# Patient Record
Sex: Female | Born: 1955 | Race: White | Hispanic: No | Marital: Single | Smoking: Never smoker
Health system: Southern US, Community
[De-identification: ages and names within clinical notes are randomized; demographics above are authoritative.]

## PROBLEM LIST (undated history)

## (undated) DIAGNOSIS — N889 Noninflammatory disorder of cervix uteri, unspecified: Secondary | ICD-10-CM

## (undated) DIAGNOSIS — E669 Obesity, unspecified: Secondary | ICD-10-CM

## (undated) DIAGNOSIS — Z96649 Presence of unspecified artificial hip joint: Secondary | ICD-10-CM

## (undated) DIAGNOSIS — R479 Unspecified speech disturbances: Secondary | ICD-10-CM

## (undated) DIAGNOSIS — R0600 Dyspnea, unspecified: Secondary | ICD-10-CM

## (undated) DIAGNOSIS — I471 Supraventricular tachycardia: Secondary | ICD-10-CM

## (undated) DIAGNOSIS — J9612 Chronic respiratory failure with hypercapnia: Secondary | ICD-10-CM

## (undated) DIAGNOSIS — R198 Other specified symptoms and signs involving the digestive system and abdomen: Secondary | ICD-10-CM

## (undated) DIAGNOSIS — E039 Hypothyroidism, unspecified: Secondary | ICD-10-CM

## (undated) DIAGNOSIS — S0230XA Fracture of orbital floor, unspecified side, initial encounter for closed fracture: Secondary | ICD-10-CM

## (undated) DIAGNOSIS — I5032 Chronic diastolic (congestive) heart failure: Secondary | ICD-10-CM

## (undated) DIAGNOSIS — I10 Essential (primary) hypertension: Secondary | ICD-10-CM

## (undated) DIAGNOSIS — Q210 Ventricular septal defect: Secondary | ICD-10-CM

## (undated) DIAGNOSIS — G4733 Obstructive sleep apnea (adult) (pediatric): Secondary | ICD-10-CM

## (undated) DIAGNOSIS — I351 Nonrheumatic aortic (valve) insufficiency: Secondary | ICD-10-CM

## (undated) DIAGNOSIS — T884XXA Failed or difficult intubation, initial encounter: Secondary | ICD-10-CM

## (undated) DIAGNOSIS — I119 Hypertensive heart disease without heart failure: Secondary | ICD-10-CM

## (undated) DIAGNOSIS — Q969 Turner's syndrome, unspecified: Secondary | ICD-10-CM

## (undated) DIAGNOSIS — R590 Localized enlarged lymph nodes: Secondary | ICD-10-CM

## (undated) DIAGNOSIS — R600 Localized edema: Secondary | ICD-10-CM

## (undated) DIAGNOSIS — I480 Paroxysmal atrial fibrillation: Secondary | ICD-10-CM

## (undated) DIAGNOSIS — L309 Dermatitis, unspecified: Secondary | ICD-10-CM

## (undated) DIAGNOSIS — I2510 Atherosclerotic heart disease of native coronary artery without angina pectoris: Secondary | ICD-10-CM

## (undated) DIAGNOSIS — R739 Hyperglycemia, unspecified: Secondary | ICD-10-CM

## (undated) DIAGNOSIS — R001 Bradycardia, unspecified: Secondary | ICD-10-CM

## (undated) DIAGNOSIS — M1990 Unspecified osteoarthritis, unspecified site: Secondary | ICD-10-CM

## (undated) DIAGNOSIS — Z96659 Presence of unspecified artificial knee joint: Secondary | ICD-10-CM

## (undated) HISTORY — PX: HERNIA REPAIR: SHX51

## (undated) HISTORY — DX: Presence of unspecified artificial hip joint: Z96.649

## (undated) HISTORY — DX: Unspecified osteoarthritis, unspecified site: M19.90

## (undated) HISTORY — DX: Essential (primary) hypertension: I10

## (undated) HISTORY — DX: Presence of unspecified artificial knee joint: Z96.659

## (undated) HISTORY — DX: Obesity, unspecified: E66.9

## (undated) HISTORY — DX: Obstructive sleep apnea (adult) (pediatric): G47.33

## (undated) HISTORY — DX: Supraventricular tachycardia: I47.1

## (undated) HISTORY — DX: Noninflammatory disorder of cervix uteri, unspecified: N88.9

## (undated) HISTORY — DX: Atherosclerotic heart disease of native coronary artery without angina pectoris: I25.10

## (undated) HISTORY — DX: Hypothyroidism, unspecified: E03.9

## (undated) HISTORY — DX: Dyspnea, unspecified: R06.00

## (undated) HISTORY — DX: Hypertensive heart disease without heart failure: I11.9

## (undated) HISTORY — DX: Nonrheumatic aortic (valve) insufficiency: I35.1

## (undated) HISTORY — DX: Other specified symptoms and signs involving the digestive system and abdomen: R19.8

## (undated) HISTORY — PX: JOINT REPLACEMENT: SHX530

## (undated) HISTORY — PX: TOTAL HIP ARTHROPLASTY: SHX124

## (undated) HISTORY — DX: Localized edema: R60.0

## (undated) HISTORY — DX: Ventricular septal defect: Q21.0

## (undated) HISTORY — DX: Turner's syndrome, unspecified: Q96.9

## (undated) HISTORY — DX: Paroxysmal atrial fibrillation: I48.0

## (undated) HISTORY — DX: Fracture of orbital floor, unspecified side, initial encounter for closed fracture: S02.30XA

## (undated) HISTORY — DX: Chronic respiratory failure with hypercapnia: J96.12

## (undated) HISTORY — DX: Chronic diastolic (congestive) heart failure: I50.32

## (undated) HISTORY — PX: COLPOSCOPY: SHX161

## (undated) HISTORY — DX: Localized enlarged lymph nodes: R59.0

## (undated) HISTORY — DX: Hyperglycemia, unspecified: R73.9

---

## 1991-01-06 DIAGNOSIS — N889 Noninflammatory disorder of cervix uteri, unspecified: Secondary | ICD-10-CM

## 1991-01-06 HISTORY — DX: Noninflammatory disorder of cervix uteri, unspecified: N88.9

## 1991-01-06 HISTORY — PX: DILATION AND CURETTAGE OF UTERUS: SHX78

## 1991-01-06 HISTORY — PX: CERVICAL CONE BIOPSY: SUR198

## 1997-05-29 ENCOUNTER — Other Ambulatory Visit: Admission: RE | Admit: 1997-05-29 | Discharge: 1997-05-29 | Payer: Self-pay | Admitting: Obstetrics and Gynecology

## 1998-06-13 ENCOUNTER — Other Ambulatory Visit: Admission: RE | Admit: 1998-06-13 | Discharge: 1998-06-13 | Payer: Self-pay | Admitting: Obstetrics and Gynecology

## 1999-09-23 ENCOUNTER — Other Ambulatory Visit: Admission: RE | Admit: 1999-09-23 | Discharge: 1999-09-23 | Payer: Self-pay | Admitting: Obstetrics and Gynecology

## 2000-09-23 ENCOUNTER — Other Ambulatory Visit: Admission: RE | Admit: 2000-09-23 | Discharge: 2000-09-23 | Payer: Self-pay | Admitting: Obstetrics and Gynecology

## 2001-09-09 ENCOUNTER — Other Ambulatory Visit: Admission: RE | Admit: 2001-09-09 | Discharge: 2001-09-09 | Payer: Self-pay | Admitting: Obstetrics and Gynecology

## 2002-09-25 ENCOUNTER — Other Ambulatory Visit: Admission: RE | Admit: 2002-09-25 | Discharge: 2002-09-25 | Payer: Self-pay | Admitting: Obstetrics and Gynecology

## 2003-11-08 ENCOUNTER — Other Ambulatory Visit: Admission: RE | Admit: 2003-11-08 | Discharge: 2003-11-08 | Payer: Self-pay | Admitting: Obstetrics and Gynecology

## 2004-11-13 ENCOUNTER — Other Ambulatory Visit: Admission: RE | Admit: 2004-11-13 | Discharge: 2004-11-13 | Payer: Self-pay | Admitting: Obstetrics and Gynecology

## 2005-12-16 ENCOUNTER — Other Ambulatory Visit: Admission: RE | Admit: 2005-12-16 | Discharge: 2005-12-16 | Payer: Self-pay | Admitting: Obstetrics and Gynecology

## 2007-01-13 ENCOUNTER — Other Ambulatory Visit: Admission: RE | Admit: 2007-01-13 | Discharge: 2007-01-13 | Payer: Self-pay | Admitting: Obstetrics and Gynecology

## 2007-11-17 ENCOUNTER — Encounter: Admission: RE | Admit: 2007-11-17 | Discharge: 2007-11-17 | Payer: Self-pay | Admitting: Cardiology

## 2008-02-02 ENCOUNTER — Ambulatory Visit (HOSPITAL_COMMUNITY): Admission: RE | Admit: 2008-02-02 | Discharge: 2008-02-02 | Payer: Self-pay | Admitting: Orthopedic Surgery

## 2008-03-13 ENCOUNTER — Inpatient Hospital Stay (HOSPITAL_COMMUNITY): Admission: RE | Admit: 2008-03-13 | Discharge: 2008-03-16 | Payer: Self-pay | Admitting: Orthopedic Surgery

## 2008-07-06 ENCOUNTER — Other Ambulatory Visit: Admission: RE | Admit: 2008-07-06 | Discharge: 2008-07-06 | Payer: Self-pay | Admitting: Obstetrics and Gynecology

## 2008-07-06 ENCOUNTER — Ambulatory Visit: Payer: Self-pay | Admitting: Obstetrics and Gynecology

## 2008-07-06 ENCOUNTER — Encounter: Payer: Self-pay | Admitting: Obstetrics and Gynecology

## 2008-08-07 ENCOUNTER — Ambulatory Visit: Payer: Self-pay | Admitting: Obstetrics and Gynecology

## 2009-06-20 HISTORY — PX: US ECHOCARDIOGRAPHY: HXRAD669

## 2009-10-08 ENCOUNTER — Other Ambulatory Visit: Admission: RE | Admit: 2009-10-08 | Discharge: 2009-10-08 | Payer: Self-pay | Admitting: Obstetrics and Gynecology

## 2009-10-08 ENCOUNTER — Ambulatory Visit: Payer: Self-pay | Admitting: Obstetrics and Gynecology

## 2009-10-17 ENCOUNTER — Ambulatory Visit: Payer: Self-pay | Admitting: Cardiology

## 2010-02-20 ENCOUNTER — Ambulatory Visit (INDEPENDENT_AMBULATORY_CARE_PROVIDER_SITE_OTHER): Payer: Medicaid Other | Admitting: Cardiology

## 2010-02-20 DIAGNOSIS — Z79899 Other long term (current) drug therapy: Secondary | ICD-10-CM

## 2010-02-20 DIAGNOSIS — I119 Hypertensive heart disease without heart failure: Secondary | ICD-10-CM

## 2010-02-20 DIAGNOSIS — E039 Hypothyroidism, unspecified: Secondary | ICD-10-CM

## 2010-04-17 LAB — CBC
HCT: 30.8 % — ABNORMAL LOW (ref 36.0–46.0)
Hemoglobin: 10.9 g/dL — ABNORMAL LOW (ref 12.0–15.0)
Hemoglobin: 14.4 g/dL (ref 12.0–15.0)
Hemoglobin: 9.4 g/dL — ABNORMAL LOW (ref 12.0–15.0)
MCHC: 34 g/dL (ref 30.0–36.0)
MCHC: 35.4 g/dL (ref 30.0–36.0)
MCV: 96 fL (ref 78.0–100.0)
RBC: 2.76 MIL/uL — ABNORMAL LOW (ref 3.87–5.11)
RBC: 3.21 MIL/uL — ABNORMAL LOW (ref 3.87–5.11)
WBC: 12.7 10*3/uL — ABNORMAL HIGH (ref 4.0–10.5)

## 2010-04-17 LAB — TYPE AND SCREEN: Antibody Screen: NEGATIVE

## 2010-04-17 LAB — BASIC METABOLIC PANEL
CO2: 27 mEq/L (ref 19–32)
Calcium: 8.5 mg/dL (ref 8.4–10.5)
Chloride: 105 mEq/L (ref 96–112)
Chloride: 105 mEq/L (ref 96–112)
Chloride: 106 mEq/L (ref 96–112)
GFR calc Af Amer: >60 mL/min (ref >60)
GFR calc Af Amer: >60 mL/min (ref >60)
GFR calc non Af Amer: >60 mL/min (ref >60)
GFR calc non Af Amer: >60 mL/min (ref >60)
Glucose, Bld: 133 mg/dL — ABNORMAL HIGH (ref 70–99)
Potassium: 3.5 mEq/L (ref 3.5–5.1)
Potassium: 4.1 mEq/L (ref 3.5–5.1)

## 2010-04-17 LAB — URINALYSIS, ROUTINE W REFLEX MICROSCOPIC
Nitrite: NEGATIVE
Specific Gravity, Urine: 1.02 (ref 1.005–1.030)
pH: 6 (ref 5.0–8.0)

## 2010-04-17 LAB — PROTIME-INR: INR: 1 (ref 0.00–1.49)

## 2010-04-17 LAB — DIFFERENTIAL
Basophils Relative: 0 % (ref 0–1)
Eosinophils Absolute: 0.1 10*3/uL (ref 0.0–0.7)
Lymphocytes Relative: 19 % (ref 12–46)
Monocytes Relative: 13 % — ABNORMAL HIGH (ref 3–12)
Neutrophils Relative %: 67 % (ref 43–77)

## 2010-04-17 LAB — URINE MICROSCOPIC-ADD ON

## 2010-04-17 LAB — APTT: aPTT: 25 seconds (ref 24–37)

## 2010-04-21 LAB — URINE MICROSCOPIC-ADD ON

## 2010-04-21 LAB — URINALYSIS, ROUTINE W REFLEX MICROSCOPIC
Bilirubin Urine: NEGATIVE
Protein, ur: 30 mg/dL — AB
Specific Gravity, Urine: 1.036 — ABNORMAL HIGH (ref 1.005–1.030)
pH: 6 (ref 5.0–8.0)

## 2010-04-21 LAB — BASIC METABOLIC PANEL
BUN: 15 mg/dL (ref 6–23)
CO2: 27 mEq/L (ref 19–32)
Creatinine, Ser: 0.7 mg/dL (ref 0.4–1.2)
GFR calc Af Amer: >60 mL/min (ref >60)
Glucose, Bld: 108 mg/dL — ABNORMAL HIGH (ref 70–99)
Potassium: 4.4 mEq/L (ref 3.5–5.1)
Sodium: 139 mEq/L (ref 135–145)

## 2010-04-21 LAB — DIFFERENTIAL
Basophils Absolute: 0 10*3/uL (ref 0.0–0.1)
Eosinophils Absolute: 0.1 10*3/uL (ref 0.0–0.7)
Lymphocytes Relative: 18 % (ref 12–46)
Lymphs Abs: 1.8 10*3/uL (ref 0.7–4.0)
Neutro Abs: 7.3 10*3/uL (ref 1.7–7.7)
Neutrophils Relative %: 70 % (ref 43–77)

## 2010-04-21 LAB — CBC
MCHC: 34.4 g/dL (ref 30.0–36.0)
MCV: 95.2 fL (ref 78.0–100.0)
RBC: 4.27 MIL/uL (ref 3.87–5.11)
WBC: 10.4 10*3/uL (ref 4.0–10.5)

## 2010-04-21 LAB — PROTIME-INR: INR: 1 (ref 0.00–1.49)

## 2010-04-21 LAB — APTT: aPTT: 23 seconds — ABNORMAL LOW (ref 24–37)

## 2010-04-21 LAB — TYPE AND SCREEN: ABO/RH(D): AB POS

## 2010-04-21 LAB — ABO/RH: ABO/RH(D): AB POS

## 2010-05-20 NOTE — H&P (Signed)
[name], [name]                ACCOUNT NO.:  [id number]   MEDICAL RECORD NO.:  [id number]          PATIENT TYPE:  INP   LOCATION:  NA                           FACILITY:  [facility]   PHYSICIAN:  Madlyn Frankel. Charlann Boxer, M.D.  DATE OF BIRTH:  [birth year]   DATE OF ADMISSION:  03/13/2008  DATE OF DISCHARGE:                              HISTORY & PHYSICAL   PROCEDURE:  A left total hip replacement.   CHIEF COMPLAINT:  Left hip pain.   HISTORY OF PRESENT ILLNESS:  A 55 year old female with a history of left  hip pain secondary to osteoarthritis and questionable developmental  dislocation of the hip.  Refractory to all conservative treatment.  Presurgically assessed by Dr. Patty Sermons.   PRIMARY CARE PHYSICIAN:  Dr. Patty Sermons.  Also sees Dr. Edyth Gunnels.   PAST MEDICAL HISTORY:  Significant for:   1. Osteoarthritis.  2. Hypertension.  3. Turner syndrome.  4. Heart murmur.  5. Cataracts.  6. Thyroid disease.   PAST SURGERIES:  1. Hernia repair.  2. D and C.   FAMILY HISTORY:  Noncontributory.   SOCIAL HISTORY:  A high school graduate, a nonsmoker.  Primary caregiver  is her mother in the hospital.   DRUG ALLERGIES:  No known drug allergies.   MEDICATIONS:  1. Klor-Con 10 mEq p.o. daily.  2. Estradiol 0.5 mg p.o. daily.  3. Medroxyprogesterone 2.5 mg daily.  4. Metoprolol 50 mg 1/2 tablet daily.  5. Levothyroxine unknown dosage amount.  6. Darvocet p.r.n.   REVIEW OF SYSTEMS:  HEENT:  She has some hearing loss.  RESPIRATORY:  Her last chest x-ray was in 2008 or 2009.  CARDIOVASCULAR:  Positive for  murmur.  EKG done December 2009.  MUSCULOSKELETAL:  Multiple joint pain  and back pain.  Otherwise, see HPI.   PHYSICAL EXAMINATION:  Pulse 72, respirations 16, blood pressure 142/80.  GENERAL:  Awake, alert, and oriented.  HEENT:  Normocephalic.  NECK:  Supple.  No carotid bruits.  CHEST/LUNGS:  Clear to auscultation bilaterally.  BREASTS:  Deferred.  HEART:  Regular rate  and rhythm.  S1, S2 distinct with heart murmur.  ABDOMEN:  Soft, nontender, nondistended.  Bowel sounds present.  PELVIS:  Stable.  GENITOURINARY:  Deferred.  EXTREMITIES:  Left hip has decreased range of motion with increased  pain.  SKIN:  No cellulitis.  NEUROLOGIC:  Intact distal sensibilities.   LABORATORY DATA:  Labs, EKG, chest x-ray all pending presurgical  testing.   IMPRESSION:  Left hip osteoarthritis with potential developmental  dislocation of the hip.   PLAN OF ACTION:  Left total hip replacement at [facility] on  March 13, 2008 by Dr. Charlann Boxer.  Risks and complications were discussed.  Postoperative medications were provided including aspirin for DVT  prophylaxis.     ______________________________  Yetta Glassman [address], [place]      Madlyn Frankel. Charlann Boxer, M.D.  Electronically Signed    BLM/MEDQ  D:  03/07/2008  T:  03/08/2008  Job:  [id number]   cc:   Cassell Clement, M.D.  Fax: [phone]   Rande Brunt. Eda Paschal, M.D.  Fax: [phone]

## 2010-05-20 NOTE — H&P (Signed)
[name], [name]                ACCOUNT NO.:  [id number]   MEDICAL RECORD NO.:  [id number]          PATIENT TYPE:  INP   LOCATION:  NA                           [facility]ACILITY[facility]   PHYSICIAN:  Donna Frankel. Charlann Dean, M.D.  DATE OF BIRTH:  1955-06-11   DATE OF ADMISSION:  02/07/2008  DATE OF DISCHARGE:                              HISTORY & PHYSICAL   PROCEDURE:  Left total hip replacement.   CHIEF COMPLAINT:  Left hip pain.   HISTORY OF PRESENT ILLNESS:  A 55 year old female with a history of left  hip pain secondary to osteoarthritis and questionable developmental  dislocation of the hip.  This has been refractory to all conservative  treatment.  There is extensive bone wear.  She utilizes a rolling walker  for ambulation.  She had been presurgical assessed by Dr. Patty Sermons  prior to surgery.   PRIMARY CARE PHYSICIAN:  Dr. Patty Sermons and she also sees Dr. Rande Brunt.  [place].   PAST MEDICAL HISTORY:  1. Significant for osteoarthritis.  2. Hypertension.  3. Turner syndrome.  4. Heart murmur.  5. Cataracts.  6. Thyroid disease.   PAST SURGERIES:  Hernia repair, D and C.   FAMILY HISTORY:  Noncontributory.   SOCIAL HISTORY:  Single high school graduate nonsmoker.  Primary  caregiver mother in the hospital.   DRUG ALLERGIES:  No known drug allergies.   MEDICATIONS:  1. Klor-Con 10 mEq p.o. daily.  2. Estradiol 0.5 mg daily.  3. Medroxyprogesterone 2.5 mg daily.  4. Metoprolol 50 mg 1/2 tablet per day.  5. Levothyroxine unknown dosage amount daily.  6. Darvocet-N 100 one to two p.o. daily p.r.n. pain.   REVIEW OF SYSTEMS:  HEENT/NEURO:  She has some hearing loss.  RESPIRATORY:  Last chest x-ray was in 2008 or 2009.  CARDIOVASCULAR:  Positive for murmur.  Last EKG was December 26, 2007.  MUSCULOSKELETAL:  Has multiple joint pains and back pain.  Otherwise see HPI.   PHYSICAL EXAMINATION:  VITAL SIGNS:  Pulse 72, respirations 16, blood  pressure 134/84.  GENERAL:  Awake,  alert and oriented, well developed, well nourished, no  acute distress utilize rolling walker.  HEENT: Normocephalic.  NECK:  Supple.  No carotid bruits.  CHEST/LUNGS:  Clear to auscultation bilaterally.  BREASTS:  Deferred.  HEART:  Regular rate and rhythm.  S1, S2 distinct with heart murmur.  ABDOMEN:  Soft, nontender, nondistended.  Bowel sounds present.  PELVIS:  Stable.  GENITOURINARY:  Deferred.  EXTREMITIES:  Left hip has decreased range of motion with increased  pain.  SKIN:  No cellulitis.  NEUROLOGIC:  Intact distal sensibilities.   LABORATORY DATA:  Labs, EKG, chest x-ray all pending presurgical  testing.   IMPRESSION:  Left hip osteoarthritis with potential developmental  dislocation of the hip.   PLAN OF ACTION:  Left total hip replacement at [facility] on  February 07, 2008 by Dr. Durene Romans.  Risks and complications were  discussed.  Postoperative medications were provided including aspirin  for DVT prophylaxis.     ______________________________  [name], [place]  [name], M.D.  Electronically Signed    BLM/MEDQ  D:  02/01/2008  T:  02/01/2008  Job:  [id number]   cc:   Cassell Clement, M.D.  Fax: [phone]   Rande Brunt. Eda Paschal, M.D.  Fax: [phone]

## 2010-05-20 NOTE — Op Note (Signed)
[name], [name]                ACCOUNT NO.:  [id number]   MEDICAL RECORD NO.:  [id number]          PATIENT TYPE:  INP   LOCATION:  1620                         FACILITY:  [facility]   PHYSICIAN:  Madlyn Frankel. Charlann Boxer, M.D.  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   PROCEDURE:  Left total hip replacement.   COMPONENTS USED:  DePuy hip system with a size 16 Pinnacle cup, 36 metal  liner and S-ROM 14 x 9 with a 14B small sleeve and a 36 plus zero ball.  It was a 14 x 9 36 standard stem.  I used a 36 plus zero ceramic ball.   SURGEON:  Madlyn Frankel. Charlann Boxer, M.D.   ASSISTANT:  Yetta Glassman. Mann, PA   ANESTHESIA:  Spinal.   ESTIMATED BLOOD LOSS:  Approximately 500 mL.   DRAINS:  One Hemovac.   SPECIMENS:  None.   COMPLICATIONS:  None.   INDICATIONS FOR PROCEDURE:  [name] is a 55 year old female with a history  of Turner syndrome and progressive left hip pain with known degenerative  joint disease.  She had put off hip surgery with progressive shortening  of left lower extremity due to erosive changes and dysplasia as well as  erosive flattening of the femoral head.  Risks and benefits had been  discussed in the perioperative period.  She had obtained medical  clearance.  We reviewed the risks of infection, DVT, component failure,  need for revision surgery as well as bearing discussion related to her  age, etc.  Consent was obtained for the benefit of pain relief.  Please  note that my preoperative plan consisted of using a standard proximal  fitting prosthesis noting that she had a very tight femoral canal, it  was a type A with backup prosthesis as variable in case and subsequently  this was required.   PROCEDURE IN  DETAIL:  The patient was brought to the operating theater.  Once adequate anesthesia and preoperative antibiotics, Ancef,  administered, the patient was positioned into the right lateral  decubitus position with left side up, left lower extremity was then  prepped and draped in a sterile fashion.  A time-out was performed  identifying the patient, planned procedure and extremity.  A lateral  based incision was made for a posterior approach to the hip.  The  iliotibial band and gluteal fascia were then split for a posterior  approach.  The short external rotators were taken down separate from the  posterior capsule and L capsulotomy was performed indicating a  hemarthrosis perhaps due to fragmentation from the femoral head.  The  hip was dislocated and severe degenerative joint disease and flattening  was noted and appreciated.  A neck osteotomy was initially made for a  Trilock type component with an angled cut into the troch fossa.   Attention was first directed  to this Trilock.  I prepared it in the  standard fashion with starting drill, hand reamed once and recognized  the tightness of the canal with the hand reamer.  I opened up some other  reamers to try to open up a little bit distal so I could at least get  some fit down distally to prevent splitting in the femur with the wells  type component.  However, when I noted that I was unable to get the zero  stem partially down was unable to get it down even with some reaming I  decided this was not going to be her best choice and decided to use the  S-ROM component.  We did have available in the hospital the 14 x 9  system which was a small component and available for patients in this  condition.   While we were opening up the S-ROM components on the back table I went  ahead and prepared the acetabulum and acetabular exposure was obtained  and retractor placed and labrum was debrided.  I began reaming with a 43  reamer and then went up  to a 44 reamer and then reamed up by 2 to a 39  reamer.  At this point I had good bone bed preparation.  I impacted a 50  mm cup.  It appeared to be positioned anatomically beneath the anterior  and the position of the cup exposed superolateral as she did have a  little bit of loss of bone here from erosive degenerative changes.   I measured and I checked with the cup guide and was happy with the  abduction and in addition the forward flexion.  I placed a single  cancellous screw to help with initial fixation.  Based on this I went  ahead and placed a central hole eliminator and the final 36 neutral  liner again with the cup positioned about 20 degrees of forward flexion  and 40 degrees of adduction.   At this point I began to prepare the femur for S-ROM.  I went ahead and  straight reamed with an 8 and then a 9 mm reamer and then a 9.5 three-  quarters of the way down.   Initially I reamed proximally with the 14B and then a 14D.  I then  milled the proximal femur to a large.  After the initial trial with the  large I had a difficult time getting the hip reduced indicating that  there was too much length to get this reduced.  I then removed the trial  replacement and went to a B small sleeve removing some of the bone on  the proximal femur.  Even then I was unable to get it reduced.  From  this point I made a decision to over ream in an attempt to try to get  the component a little bit further.  When I did this there was limited I  found by the proximal femur with this D reamer.  I thus chose to use a B  component.  The D sleeve was removed and I used the B small sleeve  removing remaining bone.  I removed it caudally 6-8 mm of bone off the  proximal femur as relationship to my initial cut and in relationship  with typical trochanter.   I then performed a trial reduction.  This was carried out with a B small  sleeve centered at about 15-20 degrees of native anteversion and then  with  the stem  in neutral position.  The hip was noted to have the  combined anteversion of 45 degrees of anteversion.  It was stable  throughout a range of motion without evidence of impingement.   It now appeared to be lengthening overly in the left lower extremity  compared to the right.  At this point I had to remove all the trial  components and the final 14B small sleeve was opened.  The 14 x 9 S-ROM  36 standard stem was opened.  The 14B small sleeve was then impacted to  the level that the trial had sat followed by placement of the stem.  It  added just a little bit of anteversion to this in relation to neutral on  purpose.  Placed in a trial reduction a 36 plus zero ball was chosen and  the ceramic ball opened and impacted onto a dry trunnion.  The hip was  reduced.  We had irrigated the hip throughout the case.  There was no  significant bleeding requiring hemostasis.  Medium Hemovac drain was  placed deep.  I reapproximated from the posterior capsule to the  superior leaflet.  The iliotibial band and gluteal fascia were then  reapproximated using #1 Vicryl over the trochanter and over the Hemovac.  The  remainder of the wound was closed with 2-0 Vicryl and a running 4-0  Monocryl.  The hip was cleaned, dried and dressed sterilely with Steri-  Strips and a Mepilex dressing.  She was brought to the recovery room in  stable condition, tolerating the procedure well.      [name], M.D.  Electronically Signed     MDO/MEDQ  D:  03/13/2008  T:  03/14/2008  Job:  [id number]

## 2010-05-23 NOTE — Discharge Summary (Signed)
[name], [name]                ACCOUNT NO.:  [id number]   MEDICAL RECORD NO.:  [id number]          PATIENT TYPE:  INP   LOCATION:  1620                         FACILITY:  [facility]   PHYSICIAN:  Madlyn Frankel. Charlann Boxer, M.D.  DATE OF BIRTH:  13-Aug-1955   DATE OF ADMISSION:  03/13/2008  DATE OF DISCHARGE:  03/16/2008                               DISCHARGE SUMMARY   ADMITTING DIAGNOSES:  1. Osteoarthritis.  2. Hypertension.  3. Turner syndrome.  4. Heart murmur.  5. Cataract.  6. Thyroid disease.   DISCHARGE DIAGNOSES:  1. Osteoarthritis.  2. Hypertension.  3. Turner syndrome.  4. Heart murmur.  5. Cataract.  6. Thyroid disease.   HISTORY OF PRESENT ILLNESS:  A 55 year old female with a history of left  hip pain secondary to osteoarthritis with questionable developmental  dislocation of the hip.  It was refractory to all conservative  treatments.   CONSULTS:  None.   PROCEDURE:  A left total hip replacement by surgeon, Dr. Durene Romans.  Assistant, Dwyane Luo, P.A.-C.   LABORATORY DATA:  CBC, final reading, white blood cells 12.1, hemoglobin  9.4, hematocrit 26.9, platelets 172.  White cell differential, no  significant abnormalities.  Coagulation normal.  Metabolic panel, sodium  139, potassium 3.5, BUN was 12, creatinine 0.47, glucose 123.  Calcium  8.3.  UA showed rare bacteria, many epithelials, moderate leuk esterase,  treated preoperatively.   HOSPITAL COURSE:  Patient underwent a left total hip replacement,  admitted to the orthopedic floor.  Her stay was unremarkable.  She  remained hemodynamically and orthopedically stable throughout her course  of stay.  She was afebrile.  Dressing was changed on a daily basis.  Wound showed no signs of infection.  No significant drainage.  She was  neurovascularly intact on the left lower extremity throughout her course  of stay with improving hip flexor strength.  She was weightbearing as  tolerated, making good progress while in  the hospital.  By day 3, she  had met all discharge criteria for discharge home with home health care  PT.   DISCHARGE DISPOSITION:  Discharged home with home health care PT in  stable and improved condition.   DISCHARGE DIET:  Heart healthy.   DISCHARGE WOUND CARE:  Keep dry.   DISCHARGE MEDICATIONS:  1. Aspirin 325 mg one p.o. b.i.d. x6 weeks.  2. Robaxin 500 mg one p.o. q.6 to 8 p.r.n. muscle spasm pain.  3. Iron 325 mg one p.o. t.i.d. x3 weeks.  4. Colace 100 mg one p.o. b.i.d. p.r.n. constipation.  5. MiraLax 17 g p.o. daily p.r.n. constipation.  6. Norco 5/325 one to two p.o. q.4 to 6 p.r.n. pain.  7. Klor-Con 10 mEq 2 tablets q.a.m.  8. Metoprolol 50 mg 1/2 tab q.a.m.  9. Estradiol 0.5 mg one p.o. q.a.m.  10.Medroxyprogesterone 2.5 mg p.o. q.a.m.  11.Levothyroxine 100 mcg p.o. q.a.m.  12.Darvocet, hold.  13.Hydrochlorothiazide 25 mg 1/2 tab q.a.c.   DISCHARGE PHYSICAL THERAPY:  Weightbearing as tolerated with use of  rolling walker.   DISCHARGE FOLLOWUP:  Follow up with Dr. Charlann Boxer at  phone number [phone] in  2 weeks for wound check.     ______________________________  Yetta Glassman. [address], [place]      Madlyn Frankel. Charlann Boxer, M.D.  Electronically Signed    BLM/MEDQ  D:  03/30/2008  T:  03/30/2008  Job:  [id number]   cc:   Cassell Clement, M.D.  Fax: [phone]   Rande Brunt. Eda Paschal, M.D.  Fax: [phone]

## 2010-06-17 ENCOUNTER — Other Ambulatory Visit: Payer: Self-pay | Admitting: Cardiology

## 2010-06-18 NOTE — Telephone Encounter (Signed)
Med refill

## 2010-10-02 ENCOUNTER — Encounter: Payer: Self-pay | Admitting: Cardiology

## 2010-10-16 ENCOUNTER — Ambulatory Visit (INDEPENDENT_AMBULATORY_CARE_PROVIDER_SITE_OTHER): Payer: Medicaid Other | Admitting: Cardiology

## 2010-10-16 ENCOUNTER — Encounter: Payer: Self-pay | Admitting: Cardiology

## 2010-10-16 VITALS — BP 131/70 | HR 63 | Ht <= 58 in | Wt 148.0 lb

## 2010-10-16 DIAGNOSIS — E039 Hypothyroidism, unspecified: Secondary | ICD-10-CM

## 2010-10-16 DIAGNOSIS — I359 Nonrheumatic aortic valve disorder, unspecified: Secondary | ICD-10-CM

## 2010-10-16 DIAGNOSIS — I119 Hypertensive heart disease without heart failure: Secondary | ICD-10-CM

## 2010-10-16 DIAGNOSIS — I351 Nonrheumatic aortic (valve) insufficiency: Secondary | ICD-10-CM | POA: Insufficient documentation

## 2010-10-16 DIAGNOSIS — M1990 Unspecified osteoarthritis, unspecified site: Secondary | ICD-10-CM

## 2010-10-16 DIAGNOSIS — Q969 Turner's syndrome, unspecified: Secondary | ICD-10-CM

## 2010-10-16 HISTORY — DX: Unspecified osteoarthritis, unspecified site: M19.90

## 2010-10-16 HISTORY — DX: Hypertensive heart disease without heart failure: I11.9

## 2010-10-16 NOTE — Patient Instructions (Signed)
The current medical regimen is effective;  continue present plan and medications.  Work harder on diet and weight loss  Your physician wants you to follow-up in: 6 months You will receive a reminder letter in the mail two months in advance. If you don't receive a letter, please call our office to schedule the follow-up appointment.

## 2010-10-16 NOTE — Progress Notes (Signed)
[name] Date of Birth:  Feb 03, 1955 [facility] / [facility] [address]   [address] [place], [place]  [address] [phone]           Fax   [phone]  HPI: This pleasant 55 year old single, Caucasian female is seen for a six-month followup office visit.  She has a history of Turner's syndrome.  She also has a history of mild essential hypertension.  She has a history of mild aortic insufficiency murmur.  She has a history of osteoarthritis and had to have a left total hip replacement in March of 2010.  She also has problems with her right knee arthritis and sees Dr. Marcy Salvo who has given her intermittent steroid injections, which have helped.  The patient has a history of hypothyroidism  Current Outpatient Prescriptions  Medication Sig Dispense Refill  . ESTRADIOL TD Place onto the skin once a week.        . hydrochlorothiazide 25 MG tablet TAKE ONE TABLET BY MOUTH ONCE DAILY  34 tablet  11  . levothyroxine (SYNTHROID, LEVOTHROID) 75 MCG tablet Take 75 mcg by mouth daily.        . metoprolol tartrate (LOPRESSOR) 25 MG tablet Take 25 mg by mouth daily.        . potassium chloride (K-DUR) 10 MEQ tablet Take 10 mEq by mouth 2 (two) times daily.          No Known Allergies  Patient Active Problem List  Diagnoses  . Benign hypertensive heart disease without heart failure  . Turner syndrome  . Hypothyroid  . Aortic insufficiency  . Osteoarthritis    History  Smoking status  . Never Smoker   Smokeless tobacco  . Not on file    History  Alcohol Use No    No family history on file.  Review of Systems: The patient denies any heat or cold intolerance.  No weight gain or weight loss.  The patient denies headaches or blurry vision.  There is no cough or sputum production.  The patient denies dizziness.  There is no hematuria or hematochezia.  The patient denies any muscle aches or arthritis.  The patient denies any rash.  The patient denies frequent falling  or instability.  There is no history of depression or anxiety.  All other systems were reviewed and are negative.   Physical Exam: Filed Vitals:   10/16/10 1515  BP: 131/70  Pulse: 63   Gen. appearance reveals a well-developed, well-nourished, middle-aged woman in no distress.Pupils equal and reactive.   Extraocular Movements are full.  There is no scleral icterus.  The mouth and pharynx are normal.  The neck is supple.  The carotids reveal no bruits.  The jugular venous pressure is normal.  The thyroid is not enlarged.  There is no lymphadenopathy.  She does have a webbed neck, consistent with Turner's syndrome.The chest is clear to percussion and auscultation. There are no rales or rhonchi. Expansion of the chest is symmetrical.  Heart reveals a grade 2/6 decrescendo murmur of aortic insufficiency.The abdomen is soft and nontender. Bowel sounds are normal. The liver and spleen are not enlarged. There Are no abdominal masses. There are no bruits.  The pedal pulses are good.    There is no cyanosis or clubbing.  He does have chronic lymphedema of both feet, worse on the rightStrength is normal and symmetrical in all extremities.  There is no lateralizing weakness.  There are no sensory deficits.  The skin  is warm and dry.  There is no rash.   EKG shows sinus rhythm with marked sinus arrhythmia, prolonged QT, and minor nonspecific T-wave flattening.    Assessment / Plan:  Continue same medication.  Recheck in 6 months for followup office visit and after that consider repeat echocardiogram

## 2010-10-16 NOTE — Assessment & Plan Note (Signed)
The patient has a past history of a soft murmur of aortic insufficiency.  Her last echocardiogram was 06/20/09 and should a normal aortic root with moderate aortic insufficiency and trace mitral regurgitation.  The patient has not been experiencing any symptoms of congestive heart failure.  Her electrocardiogram today shows no evidence of LVH or strain

## 2010-10-16 NOTE — Assessment & Plan Note (Signed)
The patient has not been experiencing any chest pain or shortness of breath.  Has not been exercising as much because her mother has been ill and her weight has gone up 3 pounds.  Also, her right hip and left knee problems cause her to have difficulty ambulating.

## 2010-10-29 ENCOUNTER — Other Ambulatory Visit: Payer: Self-pay | Admitting: Obstetrics and Gynecology

## 2010-10-30 NOTE — Telephone Encounter (Signed)
I received refill request from pt pharmacy for generic Provera. Pt's last CE was Oct 2011.  I called and left message reminding her of this and that she needs to call and schedule.  I will refill Provera this time.

## 2010-11-14 ENCOUNTER — Telehealth: Payer: Self-pay | Admitting: Cardiology

## 2010-11-14 DIAGNOSIS — J069 Acute upper respiratory infection, unspecified: Secondary | ICD-10-CM

## 2010-11-14 MED ORDER — AZITHROMYCIN 250 MG PO TABS: ORAL_TABLET | ORAL | Status: AC

## 2010-11-14 NOTE — Telephone Encounter (Signed)
Mucinex and Z-Pak

## 2010-11-14 NOTE — Telephone Encounter (Signed)
Pt calling stating that she may possibly need to get something for a cold. Pt has a lot of coughing and sneezing that started yesterday. Please return pt call to discuss further.

## 2010-11-14 NOTE — Telephone Encounter (Signed)
Nonproductive cough, denies fever.  Started yesterday doesn't feel bad just coughs so much especially at night.  claritin didn't help.  [facility].  Please advise

## 2010-11-14 NOTE — Telephone Encounter (Signed)
Advised patient, call back if not better or worse

## 2010-11-18 ENCOUNTER — Telehealth: Payer: Self-pay | Admitting: Cardiology

## 2010-11-18 DIAGNOSIS — R05 Cough: Secondary | ICD-10-CM

## 2010-11-18 NOTE — Telephone Encounter (Signed)
States she is coughing so much that sometimes she does vomit up mucus.  Finished Zpak today, denies any fever.  Would like cough medication called in.  Advised  Dr. Patty Sermons working at hospital today and would be tomorrow before addressed.  Is still using Mucinex.Please advise

## 2010-11-18 NOTE — Telephone Encounter (Signed)
Advised mother

## 2010-11-18 NOTE — Telephone Encounter (Signed)
Pt has a cold and is coughing and wants cough meds

## 2010-11-18 NOTE — Telephone Encounter (Signed)
Try tussionex 5 cc BID prn

## 2010-11-28 ENCOUNTER — Other Ambulatory Visit: Payer: Self-pay | Admitting: Obstetrics and Gynecology

## 2010-12-01 NOTE — Telephone Encounter (Signed)
E-SCRIPTS NOT WORKING TODAY. CALLED IN RX AND NOTIFIED PT. NEEDS TO SET UP HER OVERDUE EXAM.

## 2010-12-03 MED ORDER — HYDROCOD POLST-CHLORPHEN POLST 10-8 MG/5ML PO LQCR: 5.0000 mL | Freq: Two times a day (BID) | ORAL | Status: DC

## 2010-12-07 ENCOUNTER — Other Ambulatory Visit: Payer: Self-pay | Admitting: Cardiology

## 2010-12-08 ENCOUNTER — Telehealth: Payer: Self-pay | Admitting: Cardiology

## 2010-12-08 DIAGNOSIS — J069 Acute upper respiratory infection, unspecified: Secondary | ICD-10-CM

## 2010-12-08 MED ORDER — AMOXICILLIN 500 MG PO CAPS: 500.0000 mg | ORAL_CAPSULE | Freq: Three times a day (TID) | ORAL | Status: AC

## 2010-12-08 NOTE — Telephone Encounter (Signed)
Still coughing mostly nonproductive, sometimes clear mucus.  Had Zpak on 11/9.  Finished Tussionex twice daily and still coughing.  Denies fever or any other symptoms except "tickle" in throat.  Is there anything else she can try?

## 2010-12-08 NOTE — Telephone Encounter (Signed)
New message:  Pt has a bad lingering cough and wants to know if there is anything she can do for this? Pt also has a lot of drainage.  Please call her and advise/

## 2010-12-08 NOTE — Telephone Encounter (Signed)
Advised mother, call back if no better

## 2010-12-08 NOTE — Telephone Encounter (Signed)
Try amoxicillin 500 mg 3 times a day #20

## 2010-12-28 ENCOUNTER — Other Ambulatory Visit: Payer: Self-pay | Admitting: Obstetrics and Gynecology

## 2011-01-07 ENCOUNTER — Encounter: Payer: Medicaid Other | Admitting: Obstetrics and Gynecology

## 2011-01-14 DIAGNOSIS — N889 Noninflammatory disorder of cervix uteri, unspecified: Secondary | ICD-10-CM | POA: Insufficient documentation

## 2011-01-14 DIAGNOSIS — I10 Essential (primary) hypertension: Secondary | ICD-10-CM | POA: Insufficient documentation

## 2011-01-21 ENCOUNTER — Ambulatory Visit (INDEPENDENT_AMBULATORY_CARE_PROVIDER_SITE_OTHER): Payer: Medicaid Other | Admitting: Obstetrics and Gynecology

## 2011-01-21 ENCOUNTER — Other Ambulatory Visit (HOSPITAL_COMMUNITY)
Admission: RE | Admit: 2011-01-21 | Discharge: 2011-01-21 | Disposition: A | Discharge status: Home / Self Care | Payer: Medicaid Other | Source: Ambulatory Visit | Attending: Obstetrics and Gynecology | Admitting: Obstetrics and Gynecology

## 2011-01-21 ENCOUNTER — Encounter: Payer: Self-pay | Admitting: Obstetrics and Gynecology

## 2011-01-21 VITALS — BP 122/76 | Ht <= 58 in | Wt 150.0 lb

## 2011-01-21 DIAGNOSIS — Q969 Turner's syndrome, unspecified: Secondary | ICD-10-CM

## 2011-01-21 DIAGNOSIS — N952 Postmenopausal atrophic vaginitis: Secondary | ICD-10-CM

## 2011-01-21 DIAGNOSIS — Z780 Asymptomatic menopausal state: Secondary | ICD-10-CM

## 2011-01-21 DIAGNOSIS — Z124 Encounter for screening for malignant neoplasm of cervix: Secondary | ICD-10-CM

## 2011-01-21 DIAGNOSIS — Z01419 Encounter for gynecological examination (general) (routine) without abnormal findings: Secondary | ICD-10-CM | POA: Insufficient documentation

## 2011-01-21 DIAGNOSIS — N951 Menopausal and female climacteric states: Secondary | ICD-10-CM

## 2011-01-21 DIAGNOSIS — R635 Abnormal weight gain: Secondary | ICD-10-CM

## 2011-01-21 NOTE — Progress Notes (Signed)
Patient came to see me today for further followup. 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 She is having no bleeding. She does have atrophic vaginitis but is not sexually active. She is not having hot flashes on medication. She had a normal bone density many years ago. She is due for her mammogram. She is having no pelvic pain. She does not have dysuria, frequency, urgency, or incontinence. She has had no fractures. She may require knee replacement. Dr. Patty Sermons lowered her Synthroid and since he's done not she's had significant weight gain.  ROS: See above. In addition patient has hypertension, aortic insufficiency,and osteoarthritis.  Physical examination: HEENT within normal limits. Neck: Thyroid not large. No masses. Supraclavicular nodes: not enlarged. Breasts: Examined in both sitting midline position. No skin changes and no masses. Abdomen: Soft no guarding rebound or masses or hernia. Pelvic: External: Within normal limits. BUS: Within normal limits. Vaginal:within normal limits. Poor estrogen effect. No evidence of cystocele rectocele or enterocele. Cervix: clean. Uterus: Normal size and shape. Adnexa: No masses. Rectovaginal exam: Confirmatory and negative. Extremities: Within normal limits.  Assessment: #1. Turner syndrome #2. Atrophic vaginitis #3. Weight gain  Plan: We have elected to stop her HRT and see if she is asymptomatic. If she symptomatic we will reinitiate HRT. She will get a mammogram and a bone density. Suggest that she discuss with Dr. Patty Sermons change in her thyroid dose. Increase exercise. Watch diet.

## 2011-01-28 ENCOUNTER — Encounter: Payer: Self-pay | Admitting: *Deleted

## 2011-01-28 NOTE — Progress Notes (Signed)
Patient ID: [name], female   DOB: [birth year], 56 y.o.   MRN: [id number] Pt called wanting to know recent pap results. Lm on vm pap was normal.

## 2011-03-06 ENCOUNTER — Other Ambulatory Visit: Payer: Self-pay | Admitting: Cardiology

## 2011-03-06 NOTE — Telephone Encounter (Signed)
Refilled metoprolol 

## 2011-03-20 ENCOUNTER — Other Ambulatory Visit: Payer: Self-pay | Admitting: Cardiology

## 2011-03-20 NOTE — Telephone Encounter (Signed)
Refilled synthroid

## 2011-03-26 ENCOUNTER — Telehealth: Payer: Self-pay | Admitting: Cardiology

## 2011-03-26 NOTE — Telephone Encounter (Signed)
error 

## 2011-04-21 ENCOUNTER — Ambulatory Visit: Payer: Medicaid Other | Admitting: Cardiology

## 2011-06-02 ENCOUNTER — Other Ambulatory Visit: Payer: Self-pay | Admitting: Cardiology

## 2011-06-04 DIAGNOSIS — Q969 Turner's syndrome, unspecified: Secondary | ICD-10-CM

## 2011-06-04 DIAGNOSIS — E039 Hypothyroidism, unspecified: Secondary | ICD-10-CM

## 2011-06-04 DIAGNOSIS — Z96649 Presence of unspecified artificial hip joint: Secondary | ICD-10-CM | POA: Insufficient documentation

## 2011-06-04 DIAGNOSIS — E669 Obesity, unspecified: Secondary | ICD-10-CM

## 2011-06-04 HISTORY — DX: Hypothyroidism, unspecified: E03.9

## 2011-06-04 HISTORY — DX: Presence of unspecified artificial hip joint: Z96.649

## 2011-06-04 HISTORY — DX: Turner's syndrome, unspecified: Q96.9

## 2011-06-04 HISTORY — DX: Obesity, unspecified: E66.9

## 2011-06-08 ENCOUNTER — Telehealth: Payer: Self-pay | Admitting: Cardiology

## 2011-06-08 NOTE — Telephone Encounter (Signed)
Please return call to patient mother Randa Evens at [phone]  Patient mother has questions about the Vitamin D 50,000 unit medication patient's new PCP has prescribed.  Please return call at [phone].

## 2011-06-09 NOTE — Telephone Encounter (Signed)
Pt's mother calling back re message she left yesterday, has questions re the vitamin d pt was prescribed by pcp

## 2011-06-09 NOTE — Telephone Encounter (Signed)
I have no objection to her taking Vit D

## 2011-06-09 NOTE — Telephone Encounter (Signed)
Pt's mother wants okay from Dr. Patty Sermons for pt to take Vitamin D 50,000units per PCP.

## 2011-06-09 NOTE — Telephone Encounter (Signed)
Called numerous times/ line busy.

## 2011-06-10 NOTE — Telephone Encounter (Signed)
Left message ok to take 

## 2011-06-12 ENCOUNTER — Encounter: Payer: Self-pay | Admitting: Cardiology

## 2011-06-12 ENCOUNTER — Ambulatory Visit (INDEPENDENT_AMBULATORY_CARE_PROVIDER_SITE_OTHER): Payer: Medicaid Other | Admitting: Cardiology

## 2011-06-12 VITALS — BP 130/80 | HR 80 | Ht <= 58 in | Wt 152.0 lb

## 2011-06-12 DIAGNOSIS — I359 Nonrheumatic aortic valve disorder, unspecified: Secondary | ICD-10-CM

## 2011-06-12 DIAGNOSIS — E039 Hypothyroidism, unspecified: Secondary | ICD-10-CM

## 2011-06-12 DIAGNOSIS — M1990 Unspecified osteoarthritis, unspecified site: Secondary | ICD-10-CM

## 2011-06-12 DIAGNOSIS — I351 Nonrheumatic aortic (valve) insufficiency: Secondary | ICD-10-CM

## 2011-06-12 DIAGNOSIS — I119 Hypertensive heart disease without heart failure: Secondary | ICD-10-CM

## 2011-06-12 NOTE — Assessment & Plan Note (Signed)
The patient is clinically euthyroid.  She has been on Synthroid.  Patient is scheduled for complete physical later this summer with Dr. Cameron Sprang and at that time her thyroid function studies should be checked to be sure that she is on the proper dose of Synthroid

## 2011-06-12 NOTE — Assessment & Plan Note (Signed)
The patient is having more problems with her osteoarthritis of the right knee.  She has seen Dr. Charlann Boxer has suggested that she will probably need a right total knee replacement soon.  She previously had a left total hip replacement and did well.

## 2011-06-12 NOTE — Assessment & Plan Note (Signed)
Blood pressure was remaining stable on current therapy.  He is not having symptoms of congestive heart failure or palpitations or chest pain.  She does have chronic lymphedema of the right lower extremity.

## 2011-06-12 NOTE — Patient Instructions (Signed)
Your physician recommends that you continue on your current medications as directed. Please refer to the Current Medication list given to you today.  Your physician wants you to follow-up in: 6 months. You will receive a reminder letter in the mail two months in advance. If you don't receive a letter, please call our office to schedule the follow-up appointment.  

## 2011-06-12 NOTE — Assessment & Plan Note (Addendum)
The patient is not having any symptoms of congestive heart failure from her aortic insufficiency murmur.  Her last echocardiogram was 06/20/09 showing moderate aortic insufficiency and trace mitral regurgitation

## 2011-06-12 NOTE — Progress Notes (Signed)
[name] Date of Birth:  Apr 15, 1955 [facility] [address] [place], [place]  [address] [phone]  Fax   [phone]  HPI: This pleasant 56 year old woman is seen for a six-month followup office visit.  She has a past history of Turner's syndrome.  Her gynecologist recently stopped her estradiol treatment.  She has a history of essential hypertension and a history of mild aortic insufficiency.  She has also had problems with osteoarthritis. Current Outpatient Prescriptions  Medication Sig Dispense Refill  . chlorpheniramine-HYDROcodone (TUSSIONEX PENNKINETIC ER) 10-8 MG/5ML LQCR Take 5 mLs by mouth every 12 (twelve) hours.  120 mL  0  . Cholecalciferol (VITAMIN D PO) Take by mouth. 50,000 weekly      . hydrochlorothiazide 25 MG tablet TAKE ONE TABLET BY MOUTH ONCE DAILY  34 tablet  11  . KLOR-CON M10 10 MEQ tablet TAKE ONE TABLET BY MOUTH TWICE DAILY  60 each  4  . levothyroxine (SYNTHROID, LEVOTHROID) 75 MCG tablet TAKE ONE TABLET BY MOUTH ONCE A DAY.  30 tablet  11  . metoprolol (LOPRESSOR) 50 MG tablet TAKE ONE-HALF TABLET BY MOUTH DAILY.  15 tablet  11  . DISCONTD: metoprolol tartrate (LOPRESSOR) 25 MG tablet Take 25 mg by mouth daily.        Marland Kitchen DISCONTD: potassium chloride (K-DUR) 10 MEQ tablet TAKE ONE TABLET BY MOUTH TWICE DAILY  60 tablet  5    No Known Allergies  Patient Active Problem List  Diagnoses  . Benign hypertensive heart disease without heart failure  . Turner syndrome  . Hypothyroid  . Aortic insufficiency  . Osteoarthritis  . Hypertension  . Turner's syndrome  . Cervix abnormality    History  Smoking status  . Never Smoker   Smokeless tobacco  . Not on file    History  Alcohol Use No    Family History  Problem Relation Age of Onset  . Hypertension Mother   . Cancer Mother     SKIN CANCER  . Breast cancer Cousin     MATERNAL COUSIN  . Diabetes Maternal Uncle     Review of Systems: The patient denies any  heat or cold intolerance.  No weight gain or weight loss.  The patient denies headaches or blurry vision.  There is no cough or sputum production.  The patient denies dizziness.  There is no hematuria or hematochezia.  The patient denies any muscle aches or arthritis.  The patient denies any rash.  The patient denies frequent falling or instability.  There is no history of depression or anxiety.  All other systems were reviewed and are negative.   Physical Exam: Filed Vitals:   06/12/11 1151  BP: 130/80  Pulse: 80   the general appearance reveals a well-developed well-nourished middle-aged woman in no distress.  She has the physical findings of Turner's syndrome including webbed neck and shield chest.  The jugular venous pressure is normal.  The carotids are normal.The chest is clear to percussion and auscultation. There are no rales or rhonchi. Expansion of the chest is symmetrical.  The heart reveals grade 2/6 decrescendo high-pitched murmur of aortic insufficiency along the left sternal edge The abdomen is soft and nontender. Bowel sounds are normal. The liver and spleen are not enlarged. There Are no abdominal masses. There are no bruits.  Extremities reveal lymphedema of the right leg.  She has osteoarthritis of the right knee with difficulty fully extending the knee because of pain.  Pedal pulses  are present.    Assessment / Plan: The patient is to continue same medications.  She will be seen here in 6 months for followup office visit and EKG.  Her medical followup will be with Dr. Cameron Sprang.  She will need periodic followup of her electrolytes and her thyroid function studies through Dr. Ermalene Searing office.

## 2011-07-25 ENCOUNTER — Other Ambulatory Visit: Payer: Self-pay | Admitting: Cardiology

## 2011-08-17 ENCOUNTER — Encounter (HOSPITAL_COMMUNITY): Payer: Self-pay | Admitting: *Deleted

## 2011-08-17 ENCOUNTER — Emergency Department (HOSPITAL_COMMUNITY): Payer: Medicaid Other

## 2011-08-17 ENCOUNTER — Emergency Department (HOSPITAL_COMMUNITY)
Admission: EM | Admit: 2011-08-17 | Discharge: 2011-08-17 | Disposition: A | Discharge status: Home / Self Care | Payer: Medicaid Other | Attending: Emergency Medicine | Admitting: Emergency Medicine

## 2011-08-17 DIAGNOSIS — I10 Essential (primary) hypertension: Secondary | ICD-10-CM | POA: Insufficient documentation

## 2011-08-17 DIAGNOSIS — Z96649 Presence of unspecified artificial hip joint: Secondary | ICD-10-CM | POA: Insufficient documentation

## 2011-08-17 DIAGNOSIS — S0230XA Fracture of orbital floor, unspecified side, initial encounter for closed fracture: Secondary | ICD-10-CM | POA: Diagnosis present

## 2011-08-17 DIAGNOSIS — S0121XA Laceration without foreign body of nose, initial encounter: Secondary | ICD-10-CM

## 2011-08-17 DIAGNOSIS — E039 Hypothyroidism, unspecified: Secondary | ICD-10-CM | POA: Insufficient documentation

## 2011-08-17 DIAGNOSIS — Q969 Turner's syndrome, unspecified: Secondary | ICD-10-CM | POA: Insufficient documentation

## 2011-08-17 DIAGNOSIS — S0120XA Unspecified open wound of nose, initial encounter: Secondary | ICD-10-CM | POA: Insufficient documentation

## 2011-08-17 DIAGNOSIS — Z23 Encounter for immunization: Secondary | ICD-10-CM | POA: Insufficient documentation

## 2011-08-17 DIAGNOSIS — S0083XA Contusion of other part of head, initial encounter: Secondary | ICD-10-CM | POA: Insufficient documentation

## 2011-08-17 DIAGNOSIS — Z79899 Other long term (current) drug therapy: Secondary | ICD-10-CM | POA: Insufficient documentation

## 2011-08-17 DIAGNOSIS — W1809XA Striking against other object with subsequent fall, initial encounter: Secondary | ICD-10-CM | POA: Insufficient documentation

## 2011-08-17 DIAGNOSIS — S022XXA Fracture of nasal bones, initial encounter for closed fracture: Secondary | ICD-10-CM

## 2011-08-17 DIAGNOSIS — S0003XA Contusion of scalp, initial encounter: Secondary | ICD-10-CM | POA: Insufficient documentation

## 2011-08-17 DIAGNOSIS — Y92009 Unspecified place in unspecified non-institutional (private) residence as the place of occurrence of the external cause: Secondary | ICD-10-CM | POA: Insufficient documentation

## 2011-08-17 HISTORY — DX: Fracture of orbital floor, unspecified side, initial encounter for closed fracture: S02.30XA

## 2011-08-17 LAB — CBC WITH DIFFERENTIAL/PLATELET
Basophils Absolute: 0 10*3/uL (ref 0.0–0.1)
Basophils Relative: 0 % (ref 0–1)
Eosinophils Absolute: 0 10*3/uL (ref 0.0–0.7)
Eosinophils Relative: 0 % (ref 0–5)
Lymphocytes Relative: 12 % (ref 12–46)
MCHC: 34.2 g/dL (ref 30.0–36.0)
MCV: 96.6 fL (ref 78.0–100.0)
Platelets: 242 10*3/uL (ref 150–400)
RDW: 13 % (ref 11.5–15.5)
WBC: 11.2 10*3/uL — ABNORMAL HIGH (ref 4.0–10.5)

## 2011-08-17 LAB — POCT I-STAT, CHEM 8
BUN: 18 mg/dL (ref 6–23)
HCT: 43 % (ref 36.0–46.0)
Hemoglobin: 14.6 g/dL (ref 12.0–15.0)
Sodium: 143 mEq/L (ref 135–145)
TCO2: 30 mmol/L (ref 0–100)

## 2011-08-17 MED ORDER — TETANUS-DIPHTH-ACELL PERTUSSIS 5-2.5-18.5 LF-MCG/0.5 IM SUSP
0.5000 mL | Freq: Once | INTRAMUSCULAR | Status: AC
  Administered 2011-08-17: 0.5 mL via INTRAMUSCULAR
  Filled 2011-08-17: qty 0.5

## 2011-08-17 MED ORDER — CEPHALEXIN 500 MG PO CAPS: ORAL_CAPSULE | ORAL | Status: AC

## 2011-08-17 MED ORDER — ONDANSETRON 4 MG PO TBDP
4.0000 mg | ORAL_TABLET | Freq: Once | ORAL | Status: AC
  Administered 2011-08-17: 4 mg via ORAL
  Filled 2011-08-17: qty 1

## 2011-08-17 MED ORDER — OXYCODONE-ACETAMINOPHEN 5-325 MG PO TABS: 2.0000 | ORAL_TABLET | Freq: Four times a day (QID) | ORAL | Status: AC | PRN

## 2011-08-17 NOTE — ED Notes (Signed)
Pt transported to radiology via stretcher 

## 2011-08-17 NOTE — ED Notes (Signed)
Pt fell x 2 hrs ago and struck face on the corner of a cabinet. Pt has laceration on bridge of nose and bruising/swelling to L eye. Pt denies LoC.

## 2011-08-17 NOTE — ED Provider Notes (Signed)
History     CSN: [id number]  Arrival date & time 08/17/11  0255   First MD Initiated Contact with Patient 08/17/11 0415      Chief Complaint  Patient presents with  . Fall  . Eye Injury    (Consider location/radiation/quality/duration/timing/severity/associated sxs/prior treatment) HPI This 56 year old female accidentally tripped and fell at home striking her nose on the corner of a cabinet. There is no headache no amnesia no loss of consciousness no lightheadedness no neck pain no back pain no chest pain no shortness breath no abdominal pain or injury or pain to her extremities. She is no focal or lateralizing weakness or numbness. There is no change in speech or vision. She is bleeding with laceration to the bridge of her nose with bleeding controlled with local pressure. She has bruising around her left eye with pain to the left periorbital area. She does not want analgesics at the time of the initial examination. Last tetanus shot is unknown. Past Medical History  Diagnosis Date  . Turner's syndrome   . Aortic insufficiency   . Hypothyroidism   . SOB (shortness of breath)   . Periorbital edema   . Hypertension   . Cervix abnormality 1993    MILD ATYPICAL ADENOMATOUS HYPERPLASIA OF CERVIX    Past Surgical History  Procedure Date  . Total hip arthroplasty     LEFT  . US echocardiography 06/20/2009    EF 55-60%  . Dilation and curettage of uterus 1993    CONE BIOPSY  . Cervical cone biopsy 1993  . Hernia repair   . Colposcopy     Family History  Problem Relation Age of Onset  . Hypertension Mother   . Cancer Mother     SKIN CANCER  . Breast cancer Cousin     MATERNAL COUSIN  . Diabetes Maternal Uncle     History  Substance Use Topics  . Smoking status: Never Smoker   . Smokeless tobacco: Not on file  . Alcohol Use: No    OB History    Grav Para Term Preterm Abortions TAB SAB Ect Mult Living   0               Review of Systems 10 Systems reviewed and  are negative for acute change except as noted in the HPI. Allergies  Review of patient's allergies indicates no known allergies.  Home Medications   Current Outpatient Rx  Name Route Sig Dispense Refill  . HYDROCHLOROTHIAZIDE 25 MG PO TABS  TAKE ONE TABLET BY MOUTH ONCE DAILY. 30 tablet 12  . KLOR-CON M10 10 MEQ PO TBCR  TAKE ONE TABLET BY MOUTH TWICE DAILY 60 each 4  . LEVOTHYROXINE SODIUM 75 MCG PO TABS  TAKE ONE TABLET BY MOUTH ONCE A DAY. 30 tablet 11  . METOPROLOL TARTRATE 50 MG PO TABS  TAKE ONE-HALF TABLET BY MOUTH DAILY. 15 tablet 11  . VITAMIN D (ERGOCALCIFEROL) 50000 UNITS PO CAPS Oral Take 50,000 Units by mouth every 7 (seven) days.    . CEPHALEXIN 500 MG PO CAPS  2 caps po bid x 7 days 28 capsule 0  . OXYCODONE-ACETAMINOPHEN 5-325 MG PO TABS Oral Take 2 tablets by mouth every 6 (six) hours as needed for pain. 20 tablet 0    BP 135/93  Pulse 76  Temp 98.4 F (36.9 C) (Oral)  Resp 16  SpO2 95%  Physical Exam  Nursing note and vitals reviewed. Constitutional:       Awake, alert,  nontoxic appearance with baseline speech for patient.  HENT:  Mouth/Throat: No oropharyngeal exudate.       Irregular 2 cm laceration bridge of nose with no foreign body noted. Left periorbital hematoma with left periorbital tenderness. The bridge of the nose is tender. There is no septal hematoma. The rest of the face is nontender. She has normal jaw opening normal jaw occlusion.  Eyes: Pupils are equal, round, and reactive to light. Right eye exhibits no discharge. Left eye exhibits no discharge.       Slight difficulty with upward gaze with the left eye not performed quite as well as the right eye  Neck: Neck supple.  Cardiovascular: Normal rate and regular rhythm.   No murmur heard. Pulmonary/Chest: Effort normal and breath sounds normal. No stridor. No respiratory distress. She has no wheezes. She has no rales. She exhibits no tenderness.  Abdominal: Soft. Bowel sounds are normal. She  exhibits no mass. There is no tenderness. There is no rebound.  Musculoskeletal: She exhibits no tenderness.       Baseline ROM, moves extremities with no obvious new focal weakness.  Lymphadenopathy:    She has no cervical adenopathy.  Neurological: She is alert.       Awake, alert, cooperative and aware of situation; motor strength bilaterally; sensation normal to light touch bilaterally; peripheral visual fields full to confrontation; no facial asymmetry; tongue midline; major cranial nerves appear intact; no pronator drift, normal finger to nose bilaterally  Skin: No rash noted.  Psychiatric: She has a normal mood and affect.    ED Course  Procedures (including critical care time) Medical screening examination/treatment/procedure(s) were conducted as a shared visit with non-physician practitioner(s) and myself.  I personally evaluated the patient during the encounter PA performed lac repair. Labs Reviewed  CBC WITH DIFFERENTIAL - Abnormal; Notable for the following:    WBC 11.2 (*)     Neutro Abs 8.7 (*)     Monocytes Absolute 1.2 (*)     All other components within normal limits  POCT I-STAT, CHEM 8 - Abnormal; Notable for the following:    Potassium 3.4 (*)     Glucose, Bld 106 (*)     All other components within normal limits   Ct Orbitss W/o Cm  08/17/2011  *RADIOLOGY REPORT*  Clinical Data: Fall.  Facial trauma.  CT ORBITS WITHOUT CONTRAST  Technique:  Multidetector CT imaging of the orbits was performed following the standard protocol without intravenous contrast.  Comparison: None.  Findings: Large amount of left periorbital soft tissue emphysema is present.  Orbital emphysema is present with air bubbles in the intraconal fat.  Calcification is present posterior to the greater wing of the sphenoid, probably representing calcified meningioma. Small left maxillary hemosinus is present.  There is a left orbital floor fracture which is depressed.  Defect in the orbital floor is 6 mm  with herniation of fat.  There is also herniation of the inferior rectus medial margin through the orbital floor fracture. Medial orbital wall and lateral orbital walls intact.  The globe appears intact.  Opacification of the right frontal sinus is present which this probably chronic associated with mucous retention cyst or polyp. Ethmoid air cells are within normal limits.  Mild irregularity and leftward displacement of the tip of the left frontal process of the maxilla compatible with acute fracture.  Visualized maxillary alveolar ridge appears intact.  Anterior cartilaginous nasal septal swelling is present.  Right maxillary sinus demonstrates mucosal thickening along the  roof. Right sphenoid mucosal sinus disease. No intracranial hemorrhages identified.  There is motion artifact over the alveolar ridge on the sagittal reconstructions.  No fractures identified that extends into the alveolar ridge of the maxilla.  On the sagittal images, there is anterior translation of the left mandibular condyle which also appears degenerated.  No mandibular fracture is identified.  The pterygoid plates are intact.  Fluid is present within the left mastoid air cells.  No fluid in the middle ear.  No basilar skull fracture is identified.  IMPRESSION: 1.  Left orbital floor blowout fracture with herniation of the medial inferior rectus muscle.  Clinical assessment for entrapment recommended. 2.  Small left maxillary hemosinus. 3.  Periorbital and orbital emphysema associated with orbital fracture. 4.  Tiny minimally displaced fracture of the tip of the left frontal process of the maxilla. 5. Calcification along the inner table of the left calvarium, likely representing dural ossification. 6.  Anterior translation of the left mandibular condyle which is degenerated.  This may be chronic.  Recommend clinical assessment for temporomandibular joint injury.  Original Report Authenticated By: Andreas Newport, M.D.     1. Orbital  floor (blow-out) closed fracture   2. Nasal fracture   3. Nasal laceration       MDM  Pt seen by Dr. Kelly Splinter in ED. Pt stable in ED with no significant deterioration in condition.Patient / Family / Caregiver informed of clinical course, understand medical decision-making process, and agree with plan.         Hurman Horn, MD 08/17/11 [phone]

## 2011-08-17 NOTE — Consult Note (Signed)
Reason for Consult:facial trauma Referring Physician: Dr. Angus Seller [name] is an 56 y.o. female.  HPI: The patient is a 56 yrs old wf here with her mom for evaluation of facial trauma.  She fell and hit the stand of the microwave.  She was brought to the ED for treatment.  She has multiple medical conditions which are under care.  She is bruised and swollen around the left eye, nose and left face area.  She is able to move her eyes in all direction with slight decrease in full ROM due to the edema. There is tenderness around the left eye but her vision is intact. The laceration on the nasal bridge was repaired by the ED staff.  There is no midface instability.  Dental occlusion is normal for the patient.  Past Medical History  Diagnosis Date  . Turner's syndrome   . Aortic insufficiency   . Hypothyroidism   . SOB (shortness of breath)   . Periorbital edema   . Hypertension   . Cervix abnormality 1993    MILD ATYPICAL ADENOMATOUS HYPERPLASIA OF CERVIX    Past Surgical History  Procedure Date  . Total hip arthroplasty     LEFT  . US echocardiography 06/20/2009    EF 55-60%  . Dilation and curettage of uterus 1993    CONE BIOPSY  . Cervical cone biopsy 1993  . Hernia repair   . Colposcopy     Family History  Problem Relation Age of Onset  . Hypertension Mother   . Cancer Mother     SKIN CANCER  . Breast cancer Cousin     MATERNAL COUSIN  . Diabetes Maternal Uncle     Social History:  reports that she has never smoked. She does not have any smokeless tobacco history on file. She reports that she does not drink alcohol or use illicit drugs.  Allergies: No Known Allergies  Medications: I have reviewed the patient's current medications.  Results for orders placed during the hospital encounter of 08/17/11 (from the past 48 hour(s))  CBC WITH DIFFERENTIAL     Status: Abnormal   Collection Time   08/17/11  7:10 AM      Component Value Range Comment   WBC 11.2 (*) 4.0 -  10.5 K/uL    RBC 4.42  3.87 - 5.11 MIL/uL    Hemoglobin 14.6  12.0 - 15.0 g/dL    HCT 16.1  09.6 - 04.5 %    MCV 96.6  78.0 - 100.0 fL    MCH 33.0  26.0 - 34.0 pg    MCHC 34.2  30.0 - 36.0 g/dL    RDW 40.9  81.1 - 91.4 %    Platelets 242  150 - 400 K/uL    Neutrophils Relative 77  43 - 77 %    Neutro Abs 8.7 (*) 1.7 - 7.7 K/uL    Lymphocytes Relative 12  12 - 46 %    Lymphs Abs 1.3  0.7 - 4.0 K/uL    Monocytes Relative 11  3 - 12 %    Monocytes Absolute 1.2 (*) 0.1 - 1.0 K/uL    Eosinophils Relative 0  0 - 5 %    Eosinophils Absolute 0.0  0.0 - 0.7 K/uL    Basophils Relative 0  0 - 1 %    Basophils Absolute 0.0  0.0 - 0.1 K/uL   POCT I-STAT, CHEM 8     Status: Abnormal   Collection Time  08/17/11  7:18 AM      Component Value Range Comment   Sodium 143  135 - 145 mEq/L    Potassium 3.4 (*) 3.5 - 5.1 mEq/L    Chloride 100  96 - 112 mEq/L    BUN 18  6 - 23 mg/dL    Creatinine, Ser 0.98  0.50 - 1.10 mg/dL    Glucose, Bld 119 (*) 70 - 99 mg/dL    Calcium, Ion 1.47  8.29 - 1.23 mmol/L    TCO2 30  0 - 100 mmol/L    Hemoglobin 14.6  12.0 - 15.0 g/dL    HCT 56.2  13.0 - 86.5 %     Ct Orbitss W/o Cm  08/17/2011  *RADIOLOGY REPORT*  Clinical Data: Fall.  Facial trauma.  CT ORBITS WITHOUT CONTRAST  Technique:  Multidetector CT imaging of the orbits was performed following the standard protocol without intravenous contrast.  Comparison: None.  Findings: Large amount of left periorbital soft tissue emphysema is present.  Orbital emphysema is present with air bubbles in the intraconal fat.  Calcification is present posterior to the greater wing of the sphenoid, probably representing calcified meningioma. Small left maxillary hemosinus is present.  There is a left orbital floor fracture which is depressed.  Defect in the orbital floor is 6 mm with herniation of fat.  There is also herniation of the inferior rectus medial margin through the orbital floor fracture. Medial orbital wall and lateral  orbital walls intact.  The globe appears intact.  Opacification of the right frontal sinus is present which this probably chronic associated with mucous retention cyst or polyp. Ethmoid air cells are within normal limits.  Mild irregularity and leftward displacement of the tip of the left frontal process of the maxilla compatible with acute fracture.  Visualized maxillary alveolar ridge appears intact.  Anterior cartilaginous nasal septal swelling is present.  Right maxillary sinus demonstrates mucosal thickening along the roof. Right sphenoid mucosal sinus disease. No intracranial hemorrhages identified.  There is motion artifact over the alveolar ridge on the sagittal reconstructions.  No fractures identified that extends into the alveolar ridge of the maxilla.  On the sagittal images, there is anterior translation of the left mandibular condyle which also appears degenerated.  No mandibular fracture is identified.  The pterygoid plates are intact.  Fluid is present within the left mastoid air cells.  No fluid in the middle ear.  No basilar skull fracture is identified.  IMPRESSION: 1.  Left orbital floor blowout fracture with herniation of the medial inferior rectus muscle.  Clinical assessment for entrapment recommended. 2.  Small left maxillary hemosinus. 3.  Periorbital and orbital emphysema associated with orbital fracture. 4.  Tiny minimally displaced fracture of the tip of the left frontal process of the maxilla. 5. Calcification along the inner table of the left calvarium, likely representing dural ossification. 6.  Anterior translation of the left mandibular condyle which is degenerated.  This may be chronic.  Recommend clinical assessment for temporomandibular joint injury.  Original Report Authenticated By: Andreas Newport, M.D.    Review of Systems  Constitutional: Negative.   HENT: Negative.   Eyes: Positive for pain and redness. Negative for double vision and photophobia.  Respiratory:  Negative.   Cardiovascular: Negative.   Gastrointestinal: Negative.   Genitourinary: Negative.   Musculoskeletal: Negative.   Skin: Negative.   Neurological: Negative.   Psychiatric/Behavioral: Negative.    Blood pressure 135/93, pulse 76, temperature 98.4 F (36.9 C), temperature source  Oral, resp. rate 16, SpO2 95.00%. Physical Exam  Constitutional: She appears well-developed and well-nourished.  HENT:  Head: Normocephalic.    Right Ear: External ear normal.  Left Ear: External ear normal.  Eyes: Pupils are equal, round, and reactive to light.  Cardiovascular: Normal rate.   Respiratory: Effort normal.  GI: Soft.  Neurological: She is alert.  Skin: Skin is warm.  Psychiatric: She has a normal mood and affect.    Assessment/Plan: Left orbital floor fracture.  Recommend repair within the next week when the swelling has improved.  Ice packs and head elevation would be helpful for the meantime. No nasal blowing.  SANGER,CLAIRE 08/17/2011, 7:49 AM

## 2011-08-17 NOTE — ED Provider Notes (Signed)
LACERATION REPAIR Performed by: Dierdre Forth Authorized by: Dierdre Forth Consent: Verbal consent obtained. Risks and benefits: risks, benefits and alternatives were discussed Consent given by: patient Patient identity confirmed: provided demographic data Prepped and Draped in normal sterile fashion Wound explored  Laceration Location: bridge of the nose  Laceration Length: 2 cm  No Foreign Bodies seen or palpated  Anesthesia: local infiltration  Local anesthetic: lidocaine 2% without epinephrine  Anesthetic total: 5 ml  Irrigation method: syringe Amount of cleaning: standard  Skin closure: 4-0 vicryl  Number of sutures: 4  Technique: subcuticular  Finished with DermaMend   Patient tolerance: Patient tolerated the procedure well with no immediate complications.   Dahlia Client Hektor Huston, PA-C 08/17/11 [phone]

## 2011-09-15 ENCOUNTER — Telehealth: Payer: Self-pay | Admitting: Cardiology

## 2011-09-15 NOTE — Telephone Encounter (Signed)
Pt's mom calling re her thyroid level high, saw pcp last week, what should she do?

## 2011-09-15 NOTE — Telephone Encounter (Signed)
Advised to have PCP advise and send copy to  Dr. Patty Sermons

## 2011-09-29 ENCOUNTER — Encounter: Payer: Self-pay | Admitting: Obstetrics and Gynecology

## 2011-09-30 ENCOUNTER — Other Ambulatory Visit: Payer: Self-pay | Admitting: *Deleted

## 2011-09-30 DIAGNOSIS — Z780 Asymptomatic menopausal state: Secondary | ICD-10-CM

## 2011-10-01 ENCOUNTER — Other Ambulatory Visit: Payer: Self-pay | Admitting: Obstetrics and Gynecology

## 2011-10-01 DIAGNOSIS — Z780 Asymptomatic menopausal state: Secondary | ICD-10-CM

## 2011-10-05 ENCOUNTER — Telehealth: Payer: Self-pay | Admitting: Cardiology

## 2011-10-05 NOTE — Telephone Encounter (Signed)
New Problem:    Called in wanting to know if her daughter was supposed to be fasting or not.  Please call back.

## 2011-10-05 NOTE — Telephone Encounter (Signed)
Advised mother patient does not have to be fasting

## 2011-10-05 NOTE — Telephone Encounter (Signed)
Thyroid was off when she saw PCP in August and they adjusted her thyroid.  Will schedule ov for 11:00 on 10/07/11 for evaluation of her shortness of breath, advised mother

## 2011-10-05 NOTE — Telephone Encounter (Signed)
plz return call to patient mother Randa Evens [phone], pt has been gaining weight which makes her SOB.  plz return call to advise

## 2011-10-07 ENCOUNTER — Ambulatory Visit (INDEPENDENT_AMBULATORY_CARE_PROVIDER_SITE_OTHER)
Admission: RE | Admit: 2011-10-07 | Discharge: 2011-10-07 | Disposition: A | Discharge status: Home / Self Care | Payer: Medicaid Other | Source: Ambulatory Visit | Attending: Cardiology | Admitting: Cardiology

## 2011-10-07 ENCOUNTER — Encounter: Payer: Self-pay | Admitting: Cardiology

## 2011-10-07 ENCOUNTER — Ambulatory Visit (INDEPENDENT_AMBULATORY_CARE_PROVIDER_SITE_OTHER): Payer: Medicaid Other | Admitting: Cardiology

## 2011-10-07 VITALS — BP 129/78 | HR 70 | Ht <= 58 in | Wt 151.0 lb

## 2011-10-07 DIAGNOSIS — I359 Nonrheumatic aortic valve disorder, unspecified: Secondary | ICD-10-CM

## 2011-10-07 DIAGNOSIS — R0609 Other forms of dyspnea: Secondary | ICD-10-CM

## 2011-10-07 DIAGNOSIS — I119 Hypertensive heart disease without heart failure: Secondary | ICD-10-CM

## 2011-10-07 DIAGNOSIS — I351 Nonrheumatic aortic (valve) insufficiency: Secondary | ICD-10-CM

## 2011-10-07 DIAGNOSIS — R0989 Other specified symptoms and signs involving the circulatory and respiratory systems: Secondary | ICD-10-CM

## 2011-10-07 NOTE — Patient Instructions (Signed)
A chest x-ray takes a picture of the organs and structures inside the chest, including the heart, lungs, and blood vessels. This test can show several things, including, whether the heart is enlarges; whether fluid is building up in the lungs; and whether pacemaker / defibrillator leads are still in place.  Your physician has requested that you have an echocardiogram. Echocardiography is a painless test that uses sound waves to create images of your heart. It provides your doctor with information about the size and shape of your heart and how well your heart's chambers and valves are working. This procedure takes approximately one hour. There are no restrictions for this procedure.  Your physician recommends that you schedule a follow-up appointment in: 4 MONTHS

## 2011-10-07 NOTE — Assessment & Plan Note (Signed)
She has a history of aortic insufficiency.  She is not having any paroxysmal nocturnal dyspnea but has had more exertional dyspnea.  We will update her 2-D echo

## 2011-10-07 NOTE — Progress Notes (Signed)
[name] Date of Birth:  [birth year] [facility] [address] [place], [place]  [address] [phone]  Fax   [phone]  HPI: This pleasant 56 year old woman is seen for a six-month followup office visit. She has a past history of Turner's syndrome. Her gynecologist recently stopped her estradiol treatment. She has a history of essential hypertension and a history of mild aortic insufficiency. She has also had problems with osteoarthritis.  Since last visit she has been feeling more short of breath with exertion.   Current Outpatient Prescriptions  Medication Sig Dispense Refill  . Cholecalciferol (VITAMIN D-3 PO) Take 1 tablet by mouth daily.      . hydrochlorothiazide (HYDRODIURIL) 25 MG tablet TAKE ONE TABLET BY MOUTH ONCE DAILY.  30 tablet  12  . KLOR-CON M10 10 MEQ tablet TAKE ONE TABLET BY MOUTH TWICE DAILY  60 each  4  . levothyroxine (SYNTHROID, LEVOTHROID) 100 MCG tablet Take 100 mcg by mouth daily.      . metoprolol (LOPRESSOR) 50 MG tablet TAKE ONE-HALF TABLET BY MOUTH DAILY.  15 tablet  11  . triamcinolone cream (KENALOG) 0.5 % Apply 1 application topically at bedtime.      Marland Kitchen DISCONTD: potassium chloride (K-DUR) 10 MEQ tablet TAKE ONE TABLET BY MOUTH TWICE DAILY  60 tablet  5    No Known Allergies  Patient Active Problem List  Diagnosis  . Benign hypertensive heart disease without heart failure  . Turner syndrome  . Hypothyroid  . Aortic insufficiency  . Osteoarthritis  . Hypertension  . Turner's syndrome  . Cervix abnormality  . Orbital floor (blow-out) closed fracture    History  Smoking status  . Never Smoker   Smokeless tobacco  . Not on file    History  Alcohol Use No    Family History  Problem Relation Age of Onset  . Hypertension Mother   . Cancer Mother     SKIN CANCER  . Breast cancer Cousin     MATERNAL COUSIN  . Diabetes Maternal Uncle     Review of Systems: The patient denies any heat or cold  intolerance.  No weight gain or weight loss.  The patient denies headaches or blurry vision.  There is no cough or sputum production.  The patient denies dizziness.  There is no hematuria or hematochezia.  The patient denies any muscle aches or arthritis.  The patient denies any rash.  The patient denies frequent falling or instability.  There is no history of depression or anxiety.  All other systems were reviewed and are negative.   Physical Exam: Filed Vitals:   10/07/11 1143  BP: 129/78  Pulse: 70   the general appearance reveals a well-developed well-nourished woman in no distress.  He has some ecchymoses on her face which is the result of having fallen on 08/17/11.  She also has a residual resolving maculopapular skin rash which she thinks may be related to previous Keflex.The head and neck exam reveals pupils equal and reactive.  Extraocular movements are full.  There is no scleral icterus.  The mouth and pharynx are normal.  The neck is supple.  The carotids reveal no bruits.  The jugular venous pressure is normal.  The  thyroid is not enlarged.  There is no lymphadenopathy.  The chest is clear to percussion and auscultation.  There are no rales or rhonchi.  Expansion of the chest is symmetrical.  The precordium is quiet.  The first heart sound is  normal.  The second heart sound is physiologically split.  There is no gallop rub or click.  There is a grade 2/6 diastolic murmur of aortic insufficiency at the left sternal edge.  There is no abnormal lift or heave.  The abdomen is soft and nontender.  The bowel sounds are normal.  The liver and spleen are not enlarged.  There are no abdominal masses.  There are no abdominal bruits.  Extremities reveal good pedal pulses.  There is bilateral lymphedema worse on the right.  There is no cyanosis or clubbing.  Strength is normal and symmetrical in all extremities.  There is no lateralizing weakness.  There are no sensory deficits.  The skin is warm and dry.   There is no rash.   EKG today shows normal sinus rhythm with sinus arrhythmia and no ischemic changes  Assessment / Plan: Continue same medication.  Arrange for chest x-ray and two-dimensional echocardiogram and then determine whether she needs a stronger diuretic.  Recheck in 4 months

## 2011-10-07 NOTE — Assessment & Plan Note (Signed)
She has been on metoprolol and hydrochlorothiazide for her essential hypertension.  He has been having more exertional dyspnea.  Her weight is actually down 1 pound she has had increased ankle edema.  She does have chronic lymphedema of the right leg especially.  She has not been having any chest pain or angina.  She has not had a recent chest x-ray

## 2011-10-12 ENCOUNTER — Other Ambulatory Visit (HOSPITAL_COMMUNITY): Payer: Medicaid Other

## 2011-10-12 ENCOUNTER — Telehealth: Payer: Self-pay | Admitting: Cardiology

## 2011-10-12 NOTE — Telephone Encounter (Signed)
New Problem:    Patient's mother called in wanting to speak with you about not having her daughter come in today due to the the weather. Please call back.

## 2011-10-12 NOTE — Telephone Encounter (Signed)
Rescheduled echo for Wednesday as requested secondary to weather

## 2011-10-14 ENCOUNTER — Ambulatory Visit (HOSPITAL_COMMUNITY): Payer: Medicaid Other | Attending: Cardiovascular Disease | Admitting: Radiology

## 2011-10-14 DIAGNOSIS — I369 Nonrheumatic tricuspid valve disorder, unspecified: Secondary | ICD-10-CM | POA: Insufficient documentation

## 2011-10-14 DIAGNOSIS — I10 Essential (primary) hypertension: Secondary | ICD-10-CM | POA: Insufficient documentation

## 2011-10-14 DIAGNOSIS — R0609 Other forms of dyspnea: Secondary | ICD-10-CM | POA: Insufficient documentation

## 2011-10-14 DIAGNOSIS — I351 Nonrheumatic aortic (valve) insufficiency: Secondary | ICD-10-CM

## 2011-10-14 DIAGNOSIS — R0989 Other specified symptoms and signs involving the circulatory and respiratory systems: Secondary | ICD-10-CM | POA: Insufficient documentation

## 2011-10-14 DIAGNOSIS — I359 Nonrheumatic aortic valve disorder, unspecified: Secondary | ICD-10-CM | POA: Insufficient documentation

## 2011-10-14 NOTE — Progress Notes (Signed)
Echocardiogram performed.  

## 2011-10-15 ENCOUNTER — Telehealth: Payer: Self-pay | Admitting: *Deleted

## 2011-10-15 DIAGNOSIS — Z79899 Other long term (current) drug therapy: Secondary | ICD-10-CM

## 2011-10-15 MED ORDER — FUROSEMIDE 20 MG PO TABS: 20.0000 mg | ORAL_TABLET | Freq: Every day | ORAL | Status: DC

## 2011-10-15 NOTE — Telephone Encounter (Signed)
Message copied by Burnell Blanks on Thu Oct 15, 2011  1:11 PM ------      Message from: Cassell Clement      Created: Wed Oct 14, 2011  5:27 PM       Please report.  Echo shows good systolic function but diastolic dysfunction. Aortic insufficiency is moderate.      Stop HCTZ and switch to lasix 20 mg daily.  Increase her potassium pills to two daily. See if this helps her dyspnea and her edema.

## 2011-10-15 NOTE — Telephone Encounter (Signed)
Advised mother.  Mother stated patient already taking K+ 2 daily.  Did have labs checked within the last month at her PCP and her potassium was 4.2.  Will forward to  Dr. Patty Sermons for review

## 2011-10-15 NOTE — Telephone Encounter (Signed)
Message copied by Burnell Blanks on Thu Oct 15, 2011  5:55 PM ------      Message from: Cassell Clement      Created: Wed Oct 14, 2011  5:27 PM       Please report.  Echo shows good systolic function but diastolic dysfunction. Aortic insufficiency is moderate.      Stop HCTZ and switch to lasix 20 mg daily.  Increase her potassium pills to two daily. See if this helps her dyspnea and her edema.

## 2011-10-20 NOTE — Telephone Encounter (Signed)
Follow-up:    Patient called in to follow-up on Dr. Yevonne Pax recommendation.  Please call back.

## 2011-10-20 NOTE — Telephone Encounter (Signed)
Advised mother and will forward to [facility] to schedule labs (Medicaid and unsure how to schedule correctly)  Notes Recorded by Cassell Clement, MD on 10/18/2011 at 3:41 PM Okay to continue on two potassium pills daily as we switch to lasix. Check a BMET in about 2 weeks.

## 2011-11-02 ENCOUNTER — Other Ambulatory Visit (INDEPENDENT_AMBULATORY_CARE_PROVIDER_SITE_OTHER): Payer: Medicaid Other

## 2011-11-02 DIAGNOSIS — Z79899 Other long term (current) drug therapy: Secondary | ICD-10-CM

## 2011-11-02 LAB — BASIC METABOLIC PANEL
Calcium: 9 mg/dL (ref 8.4–10.5)
GFR: 91.69 mL/min (ref >60.00)
Potassium: 3.9 mEq/L (ref 3.5–5.1)
Sodium: 141 mEq/L (ref 135–145)

## 2011-11-02 NOTE — Progress Notes (Signed)
Quick Note:  Please report to patient. The recent labs are stable. Continue same medication and careful diet. Potassium is normal now. ______ 

## 2011-11-04 ENCOUNTER — Telehealth: Payer: Self-pay | Admitting: *Deleted

## 2011-11-04 NOTE — Telephone Encounter (Signed)
Advised mother of labs.

## 2011-11-04 NOTE — Telephone Encounter (Signed)
Message copied by Burnell Blanks on Wed Nov 04, 2011  8:38 AM ------      Message from: Cassell Clement      Created: Mon Nov 02, 2011  5:09 PM       Please report to patient.  The recent labs are stable. Continue same medication and careful diet. Potassium is normal now.

## 2011-11-23 ENCOUNTER — Telehealth: Payer: Self-pay | Admitting: Cardiology

## 2011-11-23 NOTE — Telephone Encounter (Signed)
New problem:   Discuss recently testing, echo, ekg.  cxr

## 2011-11-23 NOTE — Telephone Encounter (Signed)
Confirmed with mother patient taking Lasix. Advised mother to have her double for the next few days and to call me on Thursday with update.

## 2011-11-23 NOTE — Telephone Encounter (Signed)
According to my notes she recently switched from hydrochlorothiazide to Lasix.  If she is still having a lot of shortness of breath she may need to double up on her Lasix for a few days and see if it makes a difference.  Some of the edema in her foot may be lymphedema and may not really respond to diuretics.

## 2011-11-23 NOTE — Telephone Encounter (Signed)
Patient still having swelling in her foot (no different than has been) and shortness of breath. Mother states shortness of breath may be a little better, did change recently to HCTZ. Mother states that sometimes she is not sure if patient even aware she breathing differently.  Will forward to  Dr. Patty Sermons for review.

## 2011-11-26 ENCOUNTER — Telehealth: Payer: Self-pay | Admitting: Cardiology

## 2011-11-26 NOTE — Telephone Encounter (Signed)
Spoke with mother and patient seems to get more short of breath with walking.  Mother unsure if increasing furosemide has made a difference. Has not had increased urinary output. Will start weighing patient daily in the morning. Will forward to  Dr. Patty Sermons for review

## 2011-11-26 NOTE — Telephone Encounter (Signed)
Yes start weighing daily and keeping a diary of her weights and let us know

## 2011-11-26 NOTE — Telephone Encounter (Signed)
Spoke with mother and got update on patient. Information documented on previous telephone encounter

## 2011-11-26 NOTE — Telephone Encounter (Signed)
Advised mother. She will have patient resume regular dose tomorrow and do daily weights. Will call if breathing worse or weight up 2-3 pounds. Mother verbalized understanding.

## 2011-11-26 NOTE — Telephone Encounter (Signed)
Pt's mom was to call melinda today

## 2011-12-02 ENCOUNTER — Other Ambulatory Visit: Payer: Self-pay | Admitting: Cardiology

## 2011-12-02 MED ORDER — METOPROLOL TARTRATE 50 MG PO TABS: 50.0000 mg | ORAL_TABLET | Freq: Two times a day (BID) | ORAL | Status: DC

## 2011-12-07 ENCOUNTER — Telehealth: Payer: Self-pay | Admitting: Cardiology

## 2011-12-07 MED ORDER — POTASSIUM CHLORIDE CRYS ER 10 MEQ PO TBCR: EXTENDED_RELEASE_TABLET | ORAL | Status: DC

## 2011-12-07 NOTE — Telephone Encounter (Signed)
Pt out of potassium, said [facility] requested on Friday, uses [facility]

## 2011-12-09 ENCOUNTER — Other Ambulatory Visit: Payer: Self-pay

## 2011-12-09 MED ORDER — POTASSIUM CHLORIDE CRYS ER 10 MEQ PO TBCR: EXTENDED_RELEASE_TABLET | ORAL | Status: DC

## 2011-12-21 ENCOUNTER — Telehealth: Payer: Self-pay | Admitting: Cardiology

## 2011-12-21 NOTE — Telephone Encounter (Signed)
New Problem:    Called in wanting you to give her a call back.  Please call back.

## 2011-12-21 NOTE — Telephone Encounter (Signed)
Patient still having issues with shortness of breath. Scheduled appointment for patient to see  Dr. Patty Sermons on 12/18

## 2011-12-23 ENCOUNTER — Ambulatory Visit (INDEPENDENT_AMBULATORY_CARE_PROVIDER_SITE_OTHER): Payer: Medicaid Other | Admitting: Cardiology

## 2011-12-23 ENCOUNTER — Encounter: Payer: Self-pay | Admitting: Cardiology

## 2011-12-23 VITALS — BP 132/70 | HR 75 | Resp 18 | Ht <= 58 in | Wt 159.0 lb

## 2011-12-23 DIAGNOSIS — R0609 Other forms of dyspnea: Secondary | ICD-10-CM

## 2011-12-23 DIAGNOSIS — M1990 Unspecified osteoarthritis, unspecified site: Secondary | ICD-10-CM

## 2011-12-23 DIAGNOSIS — R739 Hyperglycemia, unspecified: Secondary | ICD-10-CM

## 2011-12-23 DIAGNOSIS — R7309 Other abnormal glucose: Secondary | ICD-10-CM

## 2011-12-23 DIAGNOSIS — I351 Nonrheumatic aortic (valve) insufficiency: Secondary | ICD-10-CM

## 2011-12-23 DIAGNOSIS — I359 Nonrheumatic aortic valve disorder, unspecified: Secondary | ICD-10-CM

## 2011-12-23 DIAGNOSIS — R0989 Other specified symptoms and signs involving the circulatory and respiratory systems: Secondary | ICD-10-CM

## 2011-12-23 HISTORY — DX: Hyperglycemia, unspecified: R73.9

## 2011-12-23 MED ORDER — POTASSIUM CHLORIDE CRYS ER 10 MEQ PO TBCR: EXTENDED_RELEASE_TABLET | ORAL | Status: DC

## 2011-12-23 MED ORDER — FUROSEMIDE 40 MG PO TABS: 40.0000 mg | ORAL_TABLET | Freq: Every day | ORAL | Status: DC

## 2011-12-23 NOTE — Patient Instructions (Signed)
INCREASE YOUR LASIX TO 40 MG DAILY   INCREASE YOUR POTASSIUM TO 2 TABLETS TWICE A DAY  Sodium-Controlled Diet Sodium is a mineral. It is found in many foods. Sodium may be found naturally or added during the making of a food. The most common form of sodium is salt, which is made up of sodium and chloride. Reducing your sodium intake involves changing your eating habits. The following guidelines will help you reduce the sodium in your diet:  Stop using the salt shaker.  Use salt sparingly in cooking and baking.  Substitute with sodium-free seasonings and spices.  Do not use a salt substitute (potassium chloride) without your caregiver's permission.  Include a variety of fresh, unprocessed foods in your diet.  Limit the use of processed and convenience foods that are high in sodium. USE THE FOLLOWING FOODS SPARINGLY: Breads/Starches  Commercial bread stuffing, commercial pancake or waffle mixes, coating mixes. Waffles. Croutons. Prepared (boxed or frozen) potato, rice, or noodle mixes that contain salt or sodium. Salted [place] fries or hash browns. Salted popcorn, breads, crackers, chips, or snack foods. Vegetables  Vegetables canned with salt or prepared in cream, butter, or cheese sauces. Sauerkraut. Tomato or vegetable juices canned with salt.  Fresh vegetables are allowed if rinsed thoroughly. Fruit  Fruit is okay to eat. Meat and Meat Substitutes  Salted or smoked meats, such as bacon or Canadian bacon, chipped or corned beef, hot dogs, salt pork, luncheon meats, pastrami, ham, or sausage. Canned or smoked fish, poultry, or meat. Processed cheese or cheese spreads, blue or Roquefort cheese. Battered or frozen fish products. Prepared spaghetti sauce. Baked beans. Reuben sandwiches. Salted nuts. Caviar. Milk  Limit buttermilk to 1 cup per week. Soups and Combination Foods  Bouillon cubes, canned or dried soups, broth, consomm. Convenience (frozen or packaged) dinners with  more than 600 mg sodium. Pot pies, pizza, Asian food, fast food cheeseburgers, and specialty sandwiches. Desserts and Sweets  Regular (salted) desserts, pie, commercial fruit snack pies, commercial snack cakes, canned puddings.  Eat desserts and sweets in moderation. Fats and Oils  Gravy mixes or canned gravy. No more than 1 to 2 tbs of salad dressing. Chip dips.  Eat fats and oils in moderation. Beverages  See those listed under the vegetables and milk groups. Condiments  Ketchup, mustard, meat sauces, salsa, regular (salted) and lite soy sauce or mustard. Dill pickles, olives, meat tenderizer. Prepared horseradish or pickle relish. Dutch-processed cocoa. Baking powder or baking soda used medicinally. Worcestershire sauce. "Light" salt. Salt substitute, unless approved by your caregiver. Document Released: 06/13/2001 Document Revised: 03/16/2011 Document Reviewed: 01/14/2009 [facility] Patient Information 2013 [place], [place]. RETURN IN 2 WEEKS FOR LABS [facility])  Your physician recommends that you schedule a follow-up appointment in: 3 MONTH OV/BMET/EKG

## 2011-12-23 NOTE — Assessment & Plan Note (Addendum)
The patient has had previous left hip surgery.  She has significant arthritis of the right knee and her orthopedist has suggested surgery.  Her knee discomfort prevents her from doing much exercise.  Dr. Charlann Boxer is her orthopedist.

## 2011-12-23 NOTE — Assessment & Plan Note (Signed)
Recent blood sugars have been elevated.  She is not known to be a diabetic.  We will get hemoglobin A1c on her next visit.

## 2011-12-23 NOTE — Progress Notes (Signed)
[name] Date of Birth:  November 29, 1955 [facility] [address] [place], [place]  [address] [phone]         Fax   [phone]  History of Present Illness: This pleasant 56 year old woman is seen for a work in office visit. She has a past history of Turner's syndrome.  She has a history of essential hypertension and a history of mild aortic insufficiency.  She had a chest x-ray 10/07/11 showing borderline enlarged heart size.  She had a two-dimensional echocardiogram 10/24/11 showing normal left ventricular systolic function with ejection fraction 55-60%.  There was grade 2 diastolic dysfunction.  There was mild aortic stenosis and there was moderate aortic insufficiency and there was biatrial enlargement. She has also had problems with osteoarthritis. Since last visit she has been feeling more short of breath with exertion.  She does not think that she is eating any more than usual but her weight is up another 8 pounds since last visit.  Since last visit she has continued to have problems with a generalized rash and has seen her dermatologist and has had biopsies but has not heard the results yet.   Current Outpatient Prescriptions  Medication Sig Dispense Refill  . Cholecalciferol (VITAMIN D-3 PO) Take 1 tablet by mouth daily.      . furosemide (LASIX) 40 MG tablet Take 1 tablet (40 mg total) by mouth daily.  30 tablet  5  . levothyroxine (SYNTHROID, LEVOTHROID) 100 MCG tablet Take 100 mcg by mouth daily.      . metoprolol (LOPRESSOR) 50 MG tablet Take 1 tablet (50 mg total) by mouth 2 (two) times daily.  15 tablet  11  . potassium chloride (KLOR-CON M10) 10 MEQ tablet TAKE TWO TABLETS BY MOUTH TWICE DAILY  120 tablet  12  . triamcinolone cream (KENALOG) 0.5 % Apply 1 application topically at bedtime.      . [DISCONTINUED] potassium chloride (K-DUR) 10 MEQ tablet TAKE ONE TABLET BY MOUTH TWICE DAILY  60 tablet  5    No Known Allergies  Patient Active Problem  List  Diagnosis  . Benign hypertensive heart disease without heart failure  . Turner syndrome  . Hypothyroid  . Aortic insufficiency  . Osteoarthritis  . Hypertension  . Turner's syndrome  . Cervix abnormality  . Orbital floor (blow-out) closed fracture  . Hyperglycemia    History  Smoking status  . Never Smoker   Smokeless tobacco  . Not on file    History  Alcohol Use No    Family History  Problem Relation Age of Onset  . Hypertension Mother   . Cancer Mother     SKIN CANCER  . Breast cancer Cousin     MATERNAL COUSIN  . Diabetes Maternal Uncle     Review of Systems: Constitutional: no fever chills diaphoresis or fatigue or change in weight.  Head and neck: no hearing loss, no epistaxis, no photophobia or visual disturbance. Respiratory: No cough, shortness of breath or wheezing. Cardiovascular: No chest pain peripheral edema, palpitations. Gastrointestinal: No abdominal distention, no abdominal pain, no change in bowel habits hematochezia or melena. Genitourinary: No dysuria, no frequency, no urgency, no nocturia. Musculoskeletal:No arthralgias, no back pain, no gait disturbance or myalgias. Neurological: No dizziness, no headaches, no numbness, no seizures, no syncope, no weakness, no tremors. Hematologic: No lymphadenopathy, no easy bruising. Psychiatric: No confusion, no hallucinations, no sleep disturbance.    Physical Exam: Filed Vitals:   12/23/11 1525  BP: 132/70  Pulse: 75  Resp: 18   the general appearance reveals a well-developed somewhat overweight woman with physical findings of Turner's syndrome.  She has a maculopapular rash on her trunk and extremities.  She has several recent biopsy sites.The head and neck exam reveals pupils equal and reactive.  Extraocular movements are full.  There is no scleral icterus.  The mouth and pharynx are normal.  The neck is supple.  The carotids reveal no bruits.  The jugular venous pressure is normal.  The   thyroid is not enlarged.  There is no lymphadenopathy.  The chest is clear to percussion and auscultation.  There are no rales or rhonchi.  Expansion of the chest is symmetrical.  The precordium is quiet.  The first heart sound is normal.  The second heart sound is physiologically split.  There is a grade 2/6 decrescendo murmur of aortic insufficiency.  There is no abnormal lift or heave.  The abdomen is soft and nontender.  The bowel sounds are normal.  The liver and spleen are not enlarged.  There are no abdominal masses.  There are no abdominal bruits.  Extremities reveal good pedal pulses.  There is 2+ pretibial and ankle edema.  There is no cyanosis or clubbing.  Strength is normal and symmetrical in all extremities.  There is no lateralizing weakness.  There are no sensory deficits.  The skin is warm and dry.    EKG today shows normal sinus rhythm with nonspecific ST-T wave changes unchanged since 10/16/10.  Assessment / Plan: The patient has increased exertional dyspnea.  Echocardiogram shows grade 2 diastolic dysfunction with normal systolic function and she has mild to moderate but not severe aortic valve disease.  We will increase her furosemide to 40 mg daily and increase her potassium to 2 tablets twice a day.  Her potassium is 10 mEq. We will have her return in 2 weeks for a followup basal metabolic panel, B. natruretic peptide and hemoglobin A1c. Recheck in 3 months for office visit basal metabolic panel and EKG.  Stay on a low sodium diabetic type diet.

## 2011-12-23 NOTE — Assessment & Plan Note (Signed)
The patient has not been able to do much walking because of problems with her right knee.  When she does walk much she gets short of breath and has to stop to rest.  He has had some mild peripheral edema.

## 2011-12-25 ENCOUNTER — Telehealth: Payer: Self-pay | Admitting: Cardiology

## 2011-12-25 NOTE — Telephone Encounter (Signed)
Will route to dr/nurse 

## 2011-12-25 NOTE — Telephone Encounter (Signed)
pt's mom called back to let you know that she heard back from the dermatologist and that the pt had dermatitis

## 2012-01-04 ENCOUNTER — Telehealth: Payer: Self-pay | Admitting: Cardiology

## 2012-01-04 NOTE — Telephone Encounter (Signed)
Noted, spoke with mother Zpak sent for patient  Will forward to  Dr. Patty Sermons for review

## 2012-01-04 NOTE — Telephone Encounter (Signed)
Pt's mom called back called pcp and med was given, no need to call

## 2012-01-04 NOTE — Telephone Encounter (Signed)
plz return call to pt mother Chyrl Civatte regarding pt cough [phone].

## 2012-01-05 ENCOUNTER — Other Ambulatory Visit: Payer: Medicaid Other

## 2012-01-11 ENCOUNTER — Other Ambulatory Visit: Payer: Medicaid Other

## 2012-01-18 ENCOUNTER — Other Ambulatory Visit: Payer: Medicaid Other

## 2012-02-08 ENCOUNTER — Ambulatory Visit: Payer: Medicaid Other | Admitting: Cardiology

## 2012-03-15 ENCOUNTER — Telehealth: Payer: Self-pay | Admitting: Cardiology

## 2012-03-15 NOTE — Telephone Encounter (Signed)
New problem    Mother calling wants to know what type of blood work patient need to have.

## 2012-03-15 NOTE — Telephone Encounter (Signed)
Spoke with mother and advised of labs to be done this week

## 2012-03-18 ENCOUNTER — Other Ambulatory Visit: Payer: Medicaid Other

## 2012-03-30 ENCOUNTER — Ambulatory Visit (INDEPENDENT_AMBULATORY_CARE_PROVIDER_SITE_OTHER): Payer: Medicaid Other | Admitting: Cardiology

## 2012-03-30 ENCOUNTER — Other Ambulatory Visit (INDEPENDENT_AMBULATORY_CARE_PROVIDER_SITE_OTHER): Payer: Medicaid Other

## 2012-03-30 ENCOUNTER — Encounter: Payer: Self-pay | Admitting: Cardiology

## 2012-03-30 ENCOUNTER — Other Ambulatory Visit: Payer: Medicaid Other

## 2012-03-30 VITALS — BP 118/80 | HR 69 | Ht <= 58 in | Wt 156.0 lb

## 2012-03-30 DIAGNOSIS — I359 Nonrheumatic aortic valve disorder, unspecified: Secondary | ICD-10-CM

## 2012-03-30 DIAGNOSIS — R0989 Other specified symptoms and signs involving the circulatory and respiratory systems: Secondary | ICD-10-CM

## 2012-03-30 DIAGNOSIS — R0609 Other forms of dyspnea: Secondary | ICD-10-CM

## 2012-03-30 DIAGNOSIS — M1990 Unspecified osteoarthritis, unspecified site: Secondary | ICD-10-CM

## 2012-03-30 DIAGNOSIS — I351 Nonrheumatic aortic (valve) insufficiency: Secondary | ICD-10-CM

## 2012-03-30 DIAGNOSIS — I119 Hypertensive heart disease without heart failure: Secondary | ICD-10-CM

## 2012-03-30 LAB — LIPID PANEL
Cholesterol: 166 mg/dL (ref 0–200)
HDL: 44.5 mg/dL (ref >39.00)
LDL Cholesterol: 96 mg/dL (ref 0–99)
Total CHOL/HDL Ratio: 4
Triglycerides: 129 mg/dL (ref 0.0–149.0)

## 2012-03-30 LAB — BASIC METABOLIC PANEL
CO2: 30 mEq/L (ref 19–32)
Chloride: 101 mEq/L (ref 96–112)
Potassium: 3.8 mEq/L (ref 3.5–5.1)
Sodium: 141 mEq/L (ref 135–145)

## 2012-03-30 LAB — HEPATIC FUNCTION PANEL
Albumin: 3.9 g/dL (ref 3.5–5.2)
Total Protein: 6.5 g/dL (ref 6.0–8.3)

## 2012-03-30 NOTE — Assessment & Plan Note (Signed)
The patient has known aortic insufficiency.  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

## 2012-03-30 NOTE — Progress Notes (Signed)
[name] Date of Birth:  08-05-1955 [facility] [address] [place], [place]  [address] [phone]         Fax   [phone]  History of Present Illness: This pleasant 57 year old woman is seen for a work in office visit. She has a past history of Turner's syndrome. She has a history of essential hypertension and a history of mild aortic insufficiency. She had a chest x-ray 10/07/11 showing borderline enlarged heart size. She had a two-dimensional echocardiogram 10/24/11 showing normal left ventricular systolic function with ejection fraction 55-60%. There was grade 2 diastolic dysfunction. There was mild aortic stenosis and there was moderate aortic insufficiency and there was biatrial enlargement. She has also had problems with osteoarthritis. Since last visit she has been feeling more short of breath with exertion. She does not think that she is eating any more than usual but her weight is up another 8 pounds since last visit. Since last visit she has continued to have problems with a generalized rash and has seen her dermatologist and is now being treated for the diagnosis of psoriasis.  The patient has significant osteoarthritis of her right knee and has been told that she needs right knee replacement.  Dr. Durene Romans is her orthopedist.   Current Outpatient Prescriptions  Medication Sig Dispense Refill  . Cholecalciferol (VITAMIN D-3 PO) Take 1 tablet by mouth daily.      . furosemide (LASIX) 40 MG tablet Take 1 tablet (40 mg total) by mouth daily.  30 tablet  5  . levothyroxine (SYNTHROID, LEVOTHROID) 100 MCG tablet Take 100 mcg by mouth daily.      . metoprolol (LOPRESSOR) 50 MG tablet Take 50 mg by mouth daily. Take 1/2 tablet daily      . potassium chloride (KLOR-CON M10) 10 MEQ tablet TAKE TWO TABLETS BY MOUTH TWICE DAILY  120 tablet  12  . triamcinolone cream (KENALOG) 0.5 % Apply 1 application topically at bedtime.      . [DISCONTINUED] potassium  chloride (K-DUR) 10 MEQ tablet TAKE ONE TABLET BY MOUTH TWICE DAILY  60 tablet  5   No current facility-administered medications for this visit.    No Known Allergies  Patient Active Problem List  Diagnosis  . Benign hypertensive heart disease without heart failure  . Turner syndrome  . Hypothyroid  . Aortic insufficiency  . Osteoarthritis  . Hypertension  . Turner's syndrome  . Cervix abnormality  . Orbital floor (blow-out) closed fracture  . Hyperglycemia    History  Smoking status  . Never Smoker   Smokeless tobacco  . Not on file    History  Alcohol Use No    Family History  Problem Relation Age of Onset  . Hypertension Mother   . Cancer Mother     SKIN CANCER  . Breast cancer Cousin     MATERNAL COUSIN  . Diabetes Maternal Uncle     Review of Systems: Constitutional: no fever chills diaphoresis or fatigue or change in weight.  Head and neck: no hearing loss, no epistaxis, no photophobia or visual disturbance. Respiratory: No cough, shortness of breath or wheezing. Cardiovascular: No chest pain peripheral edema, palpitations. Gastrointestinal: No abdominal distention, no abdominal pain, no change in bowel habits hematochezia or melena. Genitourinary: No dysuria, no frequency, no urgency, no nocturia. Musculoskeletal:No arthralgias, no back pain, no gait disturbance or myalgias. Neurological: No dizziness, no headaches, no numbness, no seizures, no syncope, no weakness, no tremors. Hematologic: No lymphadenopathy,  no easy bruising. Psychiatric: No confusion, no hallucinations, no sleep disturbance.    Physical Exam: Filed Vitals:   03/30/12 1301  BP: 118/80  Pulse: 69   general appearance reveals a well-developed well-nourished woman in no distress.  She has a classic features of Turner's syndrome with webbed neck and shield chest.The head and neck exam reveals pupils equal and reactive.  Extraocular movements are full.  There is no scleral icterus.   The mouth and pharynx are normal.  The neck is supple.  The carotids reveal no bruits.  The jugular venous pressure is normal.  The  thyroid is not enlarged.  There is no lymphadenopathy.  The chest is clear to percussion and auscultation.  There are no rales or rhonchi.  Expansion of the chest is symmetrical.  The precordium is quiet.  The first heart sound is normal.  The second heart sound is physiologically split.  There is no  gallop rub or click.  There is a grade 2/6 decrescendo murmur of aortic insufficiency at the left sternal edge  There is no abnormal lift or heave.  The abdomen is soft and nontender.  The bowel sounds are normal.  The liver and spleen are not enlarged.  There are no abdominal masses.  There are no abdominal bruits.  Extremities reveal good pedal pulses.  There is no phlebitis and there is mild chronic nonpitting edema bilaterally in both lower legs and feet.  There is no cyanosis or clubbing.  Strength is normal and symmetrical in all extremities.  There is no lateralizing weakness.  There are no sensory deficits.  The skin is warm and dry.  There is no rash.  EKG today shows normal sinus rhythm with sinus arrhythmia and no ischemic changes  Assessment / Plan: Overall the patient has improved since adjustment of her diuretic therapy.  She is a satisfactory candidate for proceeding with right total knee surgery.  She will recheck here in 4 months for followup office visit.  Blood work today is pending.

## 2012-03-30 NOTE — Patient Instructions (Addendum)
Will obtain labs today and call you with the results (lp/bmet/hfp)  Ok for surgery, note will be sent to Dr Charlann Boxer  Your physician recommends that you continue on your current medications as directed. Please refer to the Current Medication list given to you today.  Your physician wants you to follow-up in: 4 months ov  You will receive a reminder letter in the mail two months in advance. If you don't receive a letter, please call our office to schedule the follow-up appointment.

## 2012-03-30 NOTE — Assessment & Plan Note (Signed)
The patient needs right total knee replacement.  She is a satisfactory candidate for right total knee replacement.

## 2012-03-30 NOTE — Assessment & Plan Note (Signed)
Blood pressure has been stable on current therapy.  She has not been experiencing any chest pain or palpitations.

## 2012-03-31 NOTE — Progress Notes (Signed)
Quick Note:  Please report to patient. The recent labs are stable. Continue same medication and careful diet. Potassium okay. Kidneys slightly drier. Drink plenty of water. ______

## 2012-04-04 ENCOUNTER — Telehealth: Payer: Self-pay | Admitting: Cardiology

## 2012-04-04 NOTE — Telephone Encounter (Signed)
New problem   Pt's daughter had lab work 03/30/12 and they want to know the results.

## 2012-04-04 NOTE — Telephone Encounter (Signed)
Message copied by Burnell Blanks on Mon Apr 04, 2012  9:12 AM ------      Message from: Cassell Clement      Created: Thu Mar 31, 2012 12:05 PM       Please report to patient.  The recent labs are stable. Continue same medication and careful diet. Potassium okay. Kidneys slightly drier.  Drink plenty of water. ------

## 2012-04-04 NOTE — Telephone Encounter (Signed)
Advised mother of lab results

## 2012-05-16 ENCOUNTER — Telehealth: Payer: Self-pay | Admitting: Cardiology

## 2012-05-16 ENCOUNTER — Encounter (HOSPITAL_COMMUNITY): Payer: Self-pay | Admitting: Pharmacy Technician

## 2012-05-16 NOTE — Telephone Encounter (Signed)
Spoke with mother and reviewed chart, looks like Lasix is once a day as mother thought

## 2012-05-16 NOTE — Telephone Encounter (Signed)
New problem    Read the direction of lasix to patient mother.

## 2012-05-24 ENCOUNTER — Other Ambulatory Visit (HOSPITAL_COMMUNITY): Payer: Medicaid Other

## 2012-05-26 ENCOUNTER — Encounter (HOSPITAL_COMMUNITY)
Admission: RE | Admit: 2012-05-26 | Discharge: 2012-05-26 | Disposition: A | Discharge status: Home / Self Care | Payer: Medicaid Other | Source: Ambulatory Visit | Attending: Orthopedic Surgery | Admitting: Orthopedic Surgery

## 2012-05-26 ENCOUNTER — Encounter (HOSPITAL_COMMUNITY): Payer: Self-pay

## 2012-05-26 DIAGNOSIS — L309 Dermatitis, unspecified: Secondary | ICD-10-CM

## 2012-05-26 HISTORY — DX: Dermatitis, unspecified: L30.9

## 2012-05-26 HISTORY — DX: Localized edema: R60.0

## 2012-05-26 HISTORY — DX: Unspecified speech disturbances: R47.9

## 2012-05-26 HISTORY — DX: Failed or difficult intubation, initial encounter: T88.4XXA

## 2012-05-26 LAB — URINALYSIS, ROUTINE W REFLEX MICROSCOPIC
Bilirubin Urine: NEGATIVE
Glucose, UA: NEGATIVE mg/dL
Hgb urine dipstick: NEGATIVE
Ketones, ur: NEGATIVE mg/dL
Protein, ur: NEGATIVE mg/dL
pH: 7.5 (ref 5.0–8.0)

## 2012-05-26 LAB — SURGICAL PCR SCREEN
MRSA, PCR: NEGATIVE
Staphylococcus aureus: NEGATIVE

## 2012-05-26 LAB — BASIC METABOLIC PANEL
BUN: 18 mg/dL (ref 6–23)
Chloride: 101 mEq/L (ref 96–112)
Creatinine, Ser: 0.64 mg/dL (ref 0.50–1.10)
GFR calc Af Amer: >90 mL/min (ref >90)
GFR calc non Af Amer: >90 mL/min (ref >90)
Potassium: 4 mEq/L (ref 3.5–5.1)

## 2012-05-26 LAB — APTT: aPTT: 26 seconds (ref 24–37)

## 2012-05-26 LAB — CBC
MCHC: 35 g/dL (ref 30.0–36.0)
RDW: 12.9 % (ref 11.5–15.5)
WBC: 8.1 10*3/uL (ref 4.0–10.5)

## 2012-05-26 LAB — PROTIME-INR
INR: 0.98 (ref 0.00–1.49)
Prothrombin Time: 12.9 seconds (ref 11.6–15.2)

## 2012-05-26 NOTE — Pre-Procedure Instructions (Addendum)
05-26-12 EKG 3'14/ CXR 10'13-Epic. 05-28-12 1520 -Dr. Payton Doughty in to see. Will plan on spinal anesthesia.

## 2012-05-26 NOTE — Patient Instructions (Addendum)
20 [name]  05/26/2012   Your procedure is scheduled on:  5-27 -2014  Report to [facility] at   0600     AM..  Call this number if you have problems the morning of surgery: [phone]  Or Presurgical Testing [phone](Sabriel Borromeo)      Do not eat food:After Midnight.    Take these medicines the morning of surgery with A SIP OF WATER: Levothyroxine. Metoprolol.   Do not wear jewelry, make-up or nail polish.  Do not wear lotions, powders, or perfumes. You may wear deodorant.  Do not shave 12 hours prior to first CHG shower(legs and under arms).(face and neck okay.)  Do not bring valuables to the hospital.  Contacts, dentures or bridgework,body piercing,  may not be worn into surgery.  Leave suitcase in the car. After surgery it may be brought to your room.  For patients admitted to the hospital, checkout time is 11:00 AM the day of discharge.   Patients discharged the day of surgery will not be allowed to drive home. Must have responsible person with you x 24 hours once discharged.  Name and phone number of your driver: Shaida Route, mother - [phone] h  Special Instructions: CHG(Chlorhedine 4%-"Hibiclens","Betasept","Aplicare") Shower Use Special Wash: see special instructions.(avoid face and genitals)   Please read over the following fact sheets that you were given: MRSA Information, Blood Transfusion fact sheet, Incentive Spirometry Instruction.    Failure to follow these instructions may result in Cancellation of your surgery.   Patient signature_______________________________________________________

## 2012-05-29 NOTE — H&P (Signed)
TOTAL KNEE ADMISSION H&P  Patient is being admitted for right total knee arthroplasty.  Subjective:  Chief Complaint:  right knee OA / pain.  HPI: [name], 57 y.o. female, has a history of pain and functional disability in the right knee due to trauma and has failed non-surgical conservative treatments for greater than 12 weeks to includeNSAID's and/or analgesics, corticosteriod injections, use of assistive devices and activity modification.  Onset of symptoms was gradual, starting >10 years ago with gradually worsening course since that time. The patient noted no past surgery on the right knee(s).  Patient currently rates pain in the right knee(s) at 9 out of 10 with activity. Patient has worsening of pain with activity and weight bearing, pain that interferes with activities of daily living, pain with passive range of motion, crepitus and joint swelling.  Patient has evidence of periarticular osteophytes and joint space narrowing by imaging studies. There is no active infection. Risks, benefits and expectations were discussed with the patient. Patient understand the risks, benefits and expectations and wishes to proceed with surgery.  D/C Plans:   Home with HHPT  Post-op Meds:      No Rx given   Tranexamic Acid:   Not to be given - CAD  Decadron:    Not to be given - hyperglyceic  FYI:   ASA post-op   Patient Active Problem List   Diagnosis Date Noted  . Hyperglycemia 12/23/2011  . Orbital floor (blow-out) closed fracture 08/17/2011  . Hypertension   . Turner's syndrome   . Cervix abnormality   . 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 Turner syndrome 10/16/2010  . Hypothyroid 10/16/2010  . Aortic insufficiency 10/16/2010  . Osteoarthritis 10/16/2010   Past Medical History  Diagnosis Date  . Turner's syndrome   . Aortic insufficiency   . Hypothyroidism   . SOB (shortness of breath)   . Periorbital edema   . Hypertension   . Cervix  abnormality 1993    MILD ATYPICAL ADENOMATOUS HYPERPLASIA OF CERVIX  . Dermatitis 05-26-12    all over  since 9'13  . Edema of both legs     right greater than left  . Speech disorder     occ. "stutter"  . Difficult intubation     will plan on spinal anesthesia"small mouth/airway"    Past Surgical History  Procedure Laterality Date  . Total hip arthroplasty      LEFT  . US echocardiography  06/20/2009    EF 55-60%  . Dilation and curettage of uterus  1993    CONE BIOPSY  . Cervical cone biopsy  1993  . Hernia repair    . Colposcopy    . Joint replacement      hip replacement    No prescriptions prior to admission   Allergies  Allergen Reactions  . Cephalexin     Prefers not to have ? Psoriasis skin condition    History  Substance Use Topics  . Smoking status: Never Smoker   . Smokeless tobacco: Not on file  . Alcohol Use: No    Family History  Problem Relation Age of Onset  . Hypertension Mother   . Cancer Mother     SKIN CANCER  . Breast cancer Cousin     MATERNAL COUSIN  . Diabetes Maternal Uncle      Review of Systems  Constitutional: Negative.   HENT: Negative.   Eyes: Negative.   Respiratory: Positive for shortness of breath (  on exertion).   Cardiovascular: Positive for leg swelling.  Gastrointestinal: Negative.   Genitourinary: Negative.   Musculoskeletal: Positive for joint pain.  Skin: Positive for rash.  Neurological: Negative.   Endo/Heme/Allergies: Negative.   Psychiatric/Behavioral: Negative.     Objective:  Physical Exam  Constitutional: She is oriented to person, place, and time. She appears well-developed and well-nourished.  HENT:  Head: Normocephalic and atraumatic.  Mouth/Throat: Oropharynx is clear and moist.  Eyes: Pupils are equal, round, and reactive to light.  Neck: Neck supple. No JVD present. No tracheal deviation present.  Cardiovascular: Normal rate and intact distal pulses.   Murmur heard. Respiratory: Effort normal  and breath sounds normal. No stridor. No respiratory distress. She has no wheezes.  GI: Soft. There is no tenderness. There is no guarding.  Lymphadenopathy:    She has no cervical adenopathy.  Neurological: She is alert and oriented to person, place, and time.  Skin: Skin is warm and dry.  Psychiatric: She has a normal mood and affect.   Labs:  Estimated body mass index is 36.26 kg/(m^2) as calculated from the following:   Height as of 03/30/12: 4\' 7"  (1.397 m).   Weight as of 03/30/12: 70.761 kg (156 lb).   Imaging Review Plain radiographs demonstrate severe degenerative joint disease of the right knee(s). The overall alignment is neutral. The bone quality appears to be good for age and reported activity level.  Assessment/Plan:  End stage arthritis, right knee   The patient history, physical examination, clinical judgment of the provider and imaging studies are consistent with end stage degenerative joint disease of the right knee(s) and total knee arthroplasty is deemed medically necessary. The treatment options including medical management, injection therapy arthroscopy and arthroplasty were discussed at length. The risks and benefits of total knee arthroplasty were presented and reviewed. The risks due to aseptic loosening, infection, stiffness, patella tracking problems, thromboembolic complications and other imponderables were discussed. The patient acknowledged the explanation, agreed to proceed with the plan and consent was signed. Patient is being admitted for inpatient treatment for surgery, pain control, PT, OT, prophylactic antibiotics, VTE prophylaxis, progressive ambulation and ADL's and discharge planning. The patient is planning to be discharged home with home health services.    Anastasio Auerbach Kenner Lewan   PAC  05/29/2012, 9:46 AM

## 2012-05-31 ENCOUNTER — Encounter (HOSPITAL_COMMUNITY): Payer: Self-pay | Admitting: Anesthesiology

## 2012-05-31 ENCOUNTER — Ambulatory Visit (HOSPITAL_COMMUNITY): Payer: Medicaid Other | Admitting: Anesthesiology

## 2012-05-31 ENCOUNTER — Inpatient Hospital Stay (HOSPITAL_COMMUNITY)
Admission: RE | Admit: 2012-05-31 | Discharge: 2012-06-03 | DRG: 470 | Disposition: A | Discharge status: Home Health | Payer: Medicaid Other | Source: Ambulatory Visit | Attending: Orthopedic Surgery | Admitting: Orthopedic Surgery

## 2012-05-31 ENCOUNTER — Encounter (HOSPITAL_COMMUNITY): Payer: Self-pay | Admitting: *Deleted

## 2012-05-31 ENCOUNTER — Encounter (HOSPITAL_COMMUNITY)
Admission: RE | Disposition: A | Discharge status: Home Health | Payer: Self-pay | Source: Ambulatory Visit | Attending: Orthopedic Surgery

## 2012-05-31 DIAGNOSIS — D62 Acute posthemorrhagic anemia: Secondary | ICD-10-CM | POA: Diagnosis not present

## 2012-05-31 DIAGNOSIS — M1710 Unilateral primary osteoarthritis, unspecified knee: Principal | ICD-10-CM | POA: Diagnosis present

## 2012-05-31 DIAGNOSIS — Z96649 Presence of unspecified artificial hip joint: Secondary | ICD-10-CM

## 2012-05-31 DIAGNOSIS — Z96659 Presence of unspecified artificial knee joint: Secondary | ICD-10-CM

## 2012-05-31 DIAGNOSIS — Z96651 Presence of right artificial knee joint: Secondary | ICD-10-CM

## 2012-05-31 DIAGNOSIS — Q969 Turner's syndrome, unspecified: Secondary | ICD-10-CM

## 2012-05-31 DIAGNOSIS — D500 Iron deficiency anemia secondary to blood loss (chronic): Secondary | ICD-10-CM | POA: Diagnosis not present

## 2012-05-31 DIAGNOSIS — Z01812 Encounter for preprocedural laboratory examination: Secondary | ICD-10-CM

## 2012-05-31 DIAGNOSIS — E669 Obesity, unspecified: Secondary | ICD-10-CM | POA: Diagnosis present

## 2012-05-31 DIAGNOSIS — I10 Essential (primary) hypertension: Secondary | ICD-10-CM | POA: Diagnosis present

## 2012-05-31 HISTORY — DX: Presence of unspecified artificial knee joint: Z96.659

## 2012-05-31 HISTORY — PX: TOTAL KNEE ARTHROPLASTY: SHX125

## 2012-05-31 LAB — TYPE AND SCREEN: ABO/RH(D): AB POS

## 2012-05-31 SURGERY — ARTHROPLASTY, KNEE, TOTAL
Anesthesia: Spinal | Laterality: Right | Wound class: Clean

## 2012-05-31 MED ORDER — CLINDAMYCIN PHOSPHATE 900 MG/50ML IV SOLN
INTRAVENOUS | Status: DC | PRN
  Administered 2012-05-31: 900 mg via INTRAVENOUS

## 2012-05-31 MED ORDER — LEVOTHYROXINE SODIUM 100 MCG PO TABS
100.0000 ug | ORAL_TABLET | Freq: Every day | ORAL | Status: DC
  Administered 2012-06-01 – 2012-06-03 (×3): 100 ug via ORAL
  Filled 2012-05-31 (×4): qty 1

## 2012-05-31 MED ORDER — DOCUSATE SODIUM 100 MG PO CAPS
100.0000 mg | ORAL_CAPSULE | Freq: Two times a day (BID) | ORAL | Status: DC
  Administered 2012-05-31 – 2012-06-02 (×6): 100 mg via ORAL

## 2012-05-31 MED ORDER — ONDANSETRON HCL 4 MG PO TABS: 4.0000 mg | ORAL_TABLET | Freq: Four times a day (QID) | ORAL | Status: DC | PRN

## 2012-05-31 MED ORDER — CELECOXIB 200 MG PO CAPS
200.0000 mg | ORAL_CAPSULE | Freq: Two times a day (BID) | ORAL | Status: DC
  Administered 2012-05-31 – 2012-06-03 (×6): 200 mg via ORAL
  Filled 2012-05-31 (×9): qty 1

## 2012-05-31 MED ORDER — ASPIRIN EC 325 MG PO TBEC
325.0000 mg | DELAYED_RELEASE_TABLET | Freq: Two times a day (BID) | ORAL | Status: DC
  Administered 2012-06-01 – 2012-06-03 (×5): 325 mg via ORAL
  Filled 2012-05-31 (×7): qty 1

## 2012-05-31 MED ORDER — TRIAMCINOLONE ACETONIDE 0.5 % EX CREA
1.0000 "application " | TOPICAL_CREAM | Freq: Every day | CUTANEOUS | Status: DC
  Administered 2012-05-31 – 2012-06-02 (×3): 1 via TOPICAL
  Filled 2012-05-31: qty 15

## 2012-05-31 MED ORDER — CEFAZOLIN SODIUM-DEXTROSE 2-3 GM-% IV SOLR: 2.0000 g | INTRAVENOUS | Status: DC

## 2012-05-31 MED ORDER — LACTATED RINGERS IV SOLN: INTRAVENOUS | Status: DC

## 2012-05-31 MED ORDER — PHENYLEPHRINE HCL 10 MG/ML IJ SOLN: INTRAMUSCULAR | Status: DC | PRN

## 2012-05-31 MED ORDER — HYDROCODONE-ACETAMINOPHEN 7.5-325 MG PO TABS
1.0000 | ORAL_TABLET | ORAL | Status: DC
  Administered 2012-05-31: 1 via ORAL
  Filled 2012-05-31: qty 1

## 2012-05-31 MED ORDER — FLEET ENEMA 7-19 GM/118ML RE ENEM: 1.0000 | ENEMA | Freq: Once | RECTAL | Status: AC | PRN

## 2012-05-31 MED ORDER — ONDANSETRON HCL 4 MG/2ML IJ SOLN
INTRAMUSCULAR | Status: DC | PRN
  Administered 2012-05-31: 4 mg via INTRAVENOUS

## 2012-05-31 MED ORDER — ACETAMINOPHEN 10 MG/ML IV SOLN
INTRAVENOUS | Status: AC
  Filled 2012-05-31: qty 100

## 2012-05-31 MED ORDER — HYDROMORPHONE HCL PF 1 MG/ML IJ SOLN
0.5000 mg | INTRAMUSCULAR | Status: DC | PRN
  Administered 2012-05-31: 0.5 mg via INTRAVENOUS
  Filled 2012-05-31: qty 1

## 2012-05-31 MED ORDER — BUPIVACAINE-EPINEPHRINE PF 0.25-1:200000 % IJ SOLN
INTRAMUSCULAR | Status: DC | PRN
  Administered 2012-05-31: 30 mL

## 2012-05-31 MED ORDER — ZOLPIDEM TARTRATE 5 MG PO TABS: 5.0000 mg | ORAL_TABLET | Freq: Every evening | ORAL | Status: DC | PRN

## 2012-05-31 MED ORDER — DIPHENHYDRAMINE HCL 25 MG PO CAPS: 25.0000 mg | ORAL_CAPSULE | Freq: Four times a day (QID) | ORAL | Status: DC | PRN

## 2012-05-31 MED ORDER — METOCLOPRAMIDE HCL 5 MG/ML IJ SOLN: 5.0000 mg | Freq: Three times a day (TID) | INTRAMUSCULAR | Status: DC | PRN

## 2012-05-31 MED ORDER — 0.9 % SODIUM CHLORIDE (POUR BTL) OPTIME
TOPICAL | Status: DC | PRN
  Administered 2012-05-31: 1000 mL

## 2012-05-31 MED ORDER — BLISTEX EX OINT
TOPICAL_OINTMENT | CUTANEOUS | Status: AC
  Administered 2012-05-31: 1
  Filled 2012-05-31: qty 10

## 2012-05-31 MED ORDER — PHENYLEPHRINE HCL 10 MG/ML IJ SOLN
10.0000 mg | INTRAVENOUS | Status: DC | PRN
  Administered 2012-05-31: 10 ug/min via INTRAVENOUS

## 2012-05-31 MED ORDER — ONDANSETRON HCL 4 MG/2ML IJ SOLN
4.0000 mg | Freq: Four times a day (QID) | INTRAMUSCULAR | Status: DC | PRN
  Administered 2012-05-31: 4 mg via INTRAVENOUS
  Filled 2012-05-31: qty 2

## 2012-05-31 MED ORDER — CHLORHEXIDINE GLUCONATE 4 % EX LIQD
60.0000 mL | Freq: Once | CUTANEOUS | Status: DC
  Filled 2012-05-31: qty 60

## 2012-05-31 MED ORDER — ALBUTEROL SULFATE (5 MG/ML) 0.5% IN NEBU
2.5000 mg | INHALATION_SOLUTION | Freq: Four times a day (QID) | RESPIRATORY_TRACT | Status: DC | PRN
  Administered 2012-05-31 – 2012-06-01 (×3): 2.5 mg via RESPIRATORY_TRACT
  Filled 2012-05-31 (×3): qty 0.5

## 2012-05-31 MED ORDER — CLINDAMYCIN PHOSPHATE 600 MG/50ML IV SOLN
600.0000 mg | Freq: Four times a day (QID) | INTRAVENOUS | Status: AC
  Administered 2012-05-31 (×2): 600 mg via INTRAVENOUS
  Filled 2012-05-31 (×2): qty 50

## 2012-05-31 MED ORDER — SODIUM CHLORIDE 0.9 % IV SOLN
INTRAVENOUS | Status: DC
  Administered 2012-05-31 – 2012-06-01 (×3): via INTRAVENOUS
  Filled 2012-05-31 (×12): qty 1000

## 2012-05-31 MED ORDER — KETOROLAC TROMETHAMINE 30 MG/ML IJ SOLN
INTRAMUSCULAR | Status: DC | PRN
  Administered 2012-05-31: 30 mg

## 2012-05-31 MED ORDER — FERROUS SULFATE 325 (65 FE) MG PO TABS
325.0000 mg | ORAL_TABLET | Freq: Three times a day (TID) | ORAL | Status: DC
  Administered 2012-05-31 – 2012-06-03 (×7): 325 mg via ORAL
  Filled 2012-05-31 (×12): qty 1

## 2012-05-31 MED ORDER — MIDAZOLAM HCL 5 MG/5ML IJ SOLN
INTRAMUSCULAR | Status: DC | PRN
  Administered 2012-05-31 (×2): 1 mg via INTRAVENOUS

## 2012-05-31 MED ORDER — PHENOL 1.4 % MT LIQD: 1.0000 | OROMUCOSAL | Status: DC | PRN

## 2012-05-31 MED ORDER — BUPIVACAINE LIPOSOME 1.3 % IJ SUSP
INTRAMUSCULAR | Status: DC | PRN
  Administered 2012-05-31: 20 mL

## 2012-05-31 MED ORDER — BISACODYL 10 MG RE SUPP: 10.0000 mg | Freq: Every day | RECTAL | Status: DC | PRN

## 2012-05-31 MED ORDER — FUROSEMIDE 40 MG PO TABS
40.0000 mg | ORAL_TABLET | Freq: Every day | ORAL | Status: DC
  Administered 2012-06-01 – 2012-06-03 (×3): 40 mg via ORAL
  Filled 2012-05-31 (×4): qty 1

## 2012-05-31 MED ORDER — SODIUM CHLORIDE 0.9 % IJ SOLN
INTRAMUSCULAR | Status: DC | PRN
  Administered 2012-05-31: 50 mL

## 2012-05-31 MED ORDER — ALUM & MAG HYDROXIDE-SIMETH 200-200-20 MG/5ML PO SUSP: 30.0000 mL | ORAL | Status: DC | PRN

## 2012-05-31 MED ORDER — BUPIVACAINE LIPOSOME 1.3 % IJ SUSP
20.0000 mL | Freq: Once | INTRAMUSCULAR | Status: DC
  Filled 2012-05-31: qty 20

## 2012-05-31 MED ORDER — CLINDAMYCIN PHOSPHATE 900 MG/50ML IV SOLN
INTRAVENOUS | Status: AC
  Filled 2012-05-31: qty 50

## 2012-05-31 MED ORDER — HYDROMORPHONE HCL PF 1 MG/ML IJ SOLN: 0.2500 mg | INTRAMUSCULAR | Status: DC | PRN

## 2012-05-31 MED ORDER — MENTHOL 3 MG MT LOZG: 1.0000 | LOZENGE | OROMUCOSAL | Status: DC | PRN

## 2012-05-31 MED ORDER — SODIUM CHLORIDE 0.9 % IR SOLN
Status: DC | PRN
  Administered 2012-05-31: 1000 mL

## 2012-05-31 MED ORDER — KETOROLAC TROMETHAMINE 30 MG/ML IJ SOLN
INTRAMUSCULAR | Status: AC
  Filled 2012-05-31: qty 1

## 2012-05-31 MED ORDER — ACETAMINOPHEN 10 MG/ML IV SOLN
INTRAVENOUS | Status: DC | PRN
  Administered 2012-05-31: 1000 mg via INTRAVENOUS

## 2012-05-31 MED ORDER — POTASSIUM CHLORIDE CRYS ER 20 MEQ PO TBCR
20.0000 meq | EXTENDED_RELEASE_TABLET | Freq: Two times a day (BID) | ORAL | Status: DC
  Administered 2012-05-31 – 2012-06-03 (×6): 20 meq via ORAL
  Filled 2012-05-31 (×10): qty 1

## 2012-05-31 MED ORDER — METOCLOPRAMIDE HCL 10 MG PO TABS: 5.0000 mg | ORAL_TABLET | Freq: Three times a day (TID) | ORAL | Status: DC | PRN

## 2012-05-31 MED ORDER — POLYETHYLENE GLYCOL 3350 17 G PO PACK
17.0000 g | PACK | Freq: Two times a day (BID) | ORAL | Status: DC
  Administered 2012-06-01 – 2012-06-02 (×3): 17 g via ORAL

## 2012-05-31 MED ORDER — LACTATED RINGERS IV SOLN
INTRAVENOUS | Status: DC | PRN
  Administered 2012-05-31 (×2): via INTRAVENOUS

## 2012-05-31 MED ORDER — METOPROLOL TARTRATE 25 MG PO TABS
25.0000 mg | ORAL_TABLET | Freq: Every day | ORAL | Status: DC
  Administered 2012-06-01 – 2012-06-03 (×3): 25 mg via ORAL
  Filled 2012-05-31 (×4): qty 1

## 2012-05-31 MED ORDER — METHOCARBAMOL 500 MG PO TABS
500.0000 mg | ORAL_TABLET | Freq: Four times a day (QID) | ORAL | Status: DC | PRN
  Administered 2012-05-31 – 2012-06-02 (×3): 500 mg via ORAL
  Filled 2012-05-31 (×3): qty 1

## 2012-05-31 MED ORDER — BUPIVACAINE-EPINEPHRINE PF 0.25-1:200000 % IJ SOLN
INTRAMUSCULAR | Status: AC
  Filled 2012-05-31: qty 30

## 2012-05-31 MED ORDER — METHOCARBAMOL 100 MG/ML IJ SOLN
500.0000 mg | Freq: Four times a day (QID) | INTRAVENOUS | Status: DC | PRN
  Filled 2012-05-31: qty 5

## 2012-05-31 SURGICAL SUPPLY — 60 items
BAG ZIPLOCK 12X15 (MISCELLANEOUS) ×2 IMPLANT
BANDAGE ELASTIC 6 VELCRO ST LF (GAUZE/BANDAGES/DRESSINGS) ×2 IMPLANT
BANDAGE ESMARK 6X9 LF (GAUZE/BANDAGES/DRESSINGS) ×1 IMPLANT
BLADE SAW SGTL 13.0X1.19X90.0M (BLADE) ×2 IMPLANT
BNDG ESMARK 6X9 LF (GAUZE/BANDAGES/DRESSINGS) ×2
BOWL SMART MIX CTS (DISPOSABLE) ×2 IMPLANT
CEMENT HV SMART SET (Cement) ×4 IMPLANT
CLOTH BEACON ORANGE TIMEOUT ST (SAFETY) ×2 IMPLANT
CUFF TOURN SGL QUICK 34 (TOURNIQUET CUFF) ×1
CUFF TRNQT CYL 34X4X40X1 (TOURNIQUET CUFF) ×1 IMPLANT
DECANTER SPIKE VIAL GLASS SM (MISCELLANEOUS) ×2 IMPLANT
DERMABOND ADVANCED (GAUZE/BANDAGES/DRESSINGS) ×1
DERMABOND ADVANCED .7 DNX12 (GAUZE/BANDAGES/DRESSINGS) ×1 IMPLANT
DRAPE EXTREMITY T 121X128X90 (DRAPE) ×2 IMPLANT
DRAPE POUCH INSTRU U-SHP 10X18 (DRAPES) ×2 IMPLANT
DRAPE U-SHAPE 47X51 STRL (DRAPES) ×2 IMPLANT
DRSG AQUACEL AG ADV 3.5X10 (GAUZE/BANDAGES/DRESSINGS) ×2 IMPLANT
DRSG TEGADERM 4X4.75 (GAUZE/BANDAGES/DRESSINGS) ×2 IMPLANT
DURAPREP 26ML APPLICATOR (WOUND CARE) ×2 IMPLANT
ELECT REM PT RETURN 9FT ADLT (ELECTROSURGICAL) ×2
ELECTRODE REM PT RTRN 9FT ADLT (ELECTROSURGICAL) ×1 IMPLANT
EVACUATOR 1/8 PVC DRAIN (DRAIN) ×2 IMPLANT
FACESHIELD LNG OPTICON STERILE (SAFETY) ×10 IMPLANT
GAUZE SPONGE 2X2 8PLY STRL LF (GAUZE/BANDAGES/DRESSINGS) ×1 IMPLANT
GLOVE BIO SURGEON STRL SZ 6 (GLOVE) ×2 IMPLANT
GLOVE BIOGEL PI IND STRL 6 (GLOVE) ×1 IMPLANT
GLOVE BIOGEL PI IND STRL 7.5 (GLOVE) ×2 IMPLANT
GLOVE BIOGEL PI IND STRL 8 (GLOVE) ×1 IMPLANT
GLOVE BIOGEL PI INDICATOR 6 (GLOVE) ×1
GLOVE BIOGEL PI INDICATOR 7.5 (GLOVE) ×2
GLOVE BIOGEL PI INDICATOR 8 (GLOVE) ×1
GLOVE ECLIPSE 8.0 STRL XLNG CF (GLOVE) ×2 IMPLANT
GLOVE ORTHO TXT STRL SZ7.5 (GLOVE) ×4 IMPLANT
GLOVE SURG SS PI 6.5 STRL IVOR (GLOVE) ×2 IMPLANT
GLOVE SURG SS PI 7.5 STRL IVOR (GLOVE) ×2 IMPLANT
GOWN BRE IMP PREV XXLGXLNG (GOWN DISPOSABLE) ×6 IMPLANT
GOWN STRL NON-REIN LRG LVL3 (GOWN DISPOSABLE) ×4 IMPLANT
HANDPIECE INTERPULSE COAX TIP (DISPOSABLE) ×1
HOOD PEEL AWAY FACE SHEILD DIS (HOOD) ×2 IMPLANT
KIT BASIN OR (CUSTOM PROCEDURE TRAY) ×2 IMPLANT
MANIFOLD NEPTUNE II (INSTRUMENTS) ×2 IMPLANT
NDL SAFETY ECLIPSE 18X1.5 (NEEDLE) ×1 IMPLANT
NEEDLE HYPO 18GX1.5 SHARP (NEEDLE) ×1
NS IRRIG 1000ML POUR BTL (IV SOLUTION) ×4 IMPLANT
PACK TOTAL JOINT (CUSTOM PROCEDURE TRAY) ×2 IMPLANT
POSITIONER SURGICAL ARM (MISCELLANEOUS) ×2 IMPLANT
SET HNDPC FAN SPRY TIP SCT (DISPOSABLE) ×1 IMPLANT
SET PAD KNEE POSITIONER (MISCELLANEOUS) ×2 IMPLANT
SPONGE GAUZE 2X2 STER 10/PKG (GAUZE/BANDAGES/DRESSINGS) ×1
SUCTION FRAZIER 12FR DISP (SUCTIONS) ×2 IMPLANT
SUT MNCRL AB 4-0 PS2 18 (SUTURE) ×2 IMPLANT
SUT VIC AB 1 CT1 36 (SUTURE) ×4 IMPLANT
SUT VIC AB 2-0 CT1 27 (SUTURE) ×3
SUT VIC AB 2-0 CT1 TAPERPNT 27 (SUTURE) ×3 IMPLANT
SUT VLOC 180 0 24IN GS25 (SUTURE) ×2 IMPLANT
SYR 50ML LL SCALE MARK (SYRINGE) ×2 IMPLANT
TOWEL OR 17X26 10 PK STRL BLUE (TOWEL DISPOSABLE) ×4 IMPLANT
TRAY FOLEY CATH 14FRSI W/METER (CATHETERS) ×2 IMPLANT
WATER STERILE IRR 1500ML POUR (IV SOLUTION) ×4 IMPLANT
WRAP KNEE MAXI GEL POST OP (GAUZE/BANDAGES/DRESSINGS) ×2 IMPLANT

## 2012-05-31 NOTE — Anesthesia Preprocedure Evaluation (Addendum)
Anesthesia Evaluation  Patient identified by MRN, date of birth, ID band Patient awake    Reviewed: Allergy & Precautions, H&P , NPO status , Patient's Chart, lab work & pertinent test results  History of Anesthesia Complications (+) DIFFICULT AIRWAY  Airway Mallampati: IV TM Distance: <3 FB Neck ROM: Limited    Dental  (+) Caps and Dental Advisory Given Cap right upper front tooth:   Pulmonary neg pulmonary ROS, shortness of breath,  breath sounds clear to auscultation  Pulmonary exam normal       Cardiovascular hypertension, Pt. on home beta blockers + Valvular Problems/Murmurs AI Rhythm:regular Rate:Normal     Neuro/Psych negative neurological ROS  negative psych ROS   GI/Hepatic negative GI ROS, Neg liver ROS,   Endo/Other  negative endocrine ROSHypothyroidism   Renal/GU negative Renal ROS  negative genitourinary   Musculoskeletal   Abdominal   Peds  Hematology negative hematology ROS (+)   Anesthesia Other Findings Turner's syndrom  Reproductive/Obstetrics negative OB ROS                          Anesthesia Physical Anesthesia Plan  ASA: III  Anesthesia Plan: Spinal   Post-op Pain Management:    Induction:   Airway Management Planned: Simple Face Mask  Additional Equipment:   Intra-op Plan:   Post-operative Plan:   Informed Consent: I have reviewed the patients History and Physical, chart, labs and discussed the procedure including the risks, benefits and alternatives for the proposed anesthesia with the patient or authorized representative who has indicated his/her understanding and acceptance.   Dental Advisory Given  Plan Discussed with: CRNA and Surgeon  Anesthesia Plan Comments:         Anesthesia Quick Evaluation

## 2012-05-31 NOTE — Op Note (Signed)
NAME:  [name]                      MEDICAL RECORD NO.:  [id number]                             FACILITY:  [facility]      PHYSICIAN:  Madlyn Frankel. Charlann Boxer, M.D.  DATE OF BIRTH:  [birth year]      DATE OF PROCEDURE:  05/31/2012                                     OPERATIVE REPORT         PREOPERATIVE DIAGNOSIS:  Right knee osteoarthritis.      POSTOPERATIVE DIAGNOSIS:  Right knee osteoarthritis.      FINDINGS:  The patient was noted to have complete loss of cartilage and   bone-on-bone arthritis with associated osteophytes in the lateral and patellofemoral compartments of   the knee with a significant synovitis and associated effusion.      PROCEDURE:  Right total knee replacement.      COMPONENTS USED:  DePuy rotating platform posterior stabilized knee   system, a size 2 femur, 1.5 tibia, 12.5 mm PS insert, and 32 patellar   button.      SURGEON:  Madlyn Frankel. Charlann Boxer, M.D.      ASSISTANT:  Lanney Gins, PA-C.      ANESTHESIA:  Spinal.      SPECIMENS:  None.      COMPLICATION:  None.      DRAINS:  One Hemovac.  EBL: <100cc      TOURNIQUET TIME:   Total Tourniquet Time Documented: Thigh (Right) - 37 minutes Total: Thigh (Right) - 37 minutes  .      The patient was stable to the recovery room.      INDICATION FOR PROCEDURE:  [name] is a 57 y.o. female patient of   mine.  The patient had been seen, evaluated, and treated conservatively in the   office with medication, activity modification, and injections.  The patient had   radiographic changes of bone-on-bone arthritis with endplate sclerosis and osteophytes noted.      The patient failed conservative measures including medication, injections, and activity modification, and at this point was ready for more definitive measures.   Based on the radiographic changes and failed conservative measures, the patient   decided to proceed with total knee replacement.  Risks of infection,   DVT, component failure, need for  revision surgery, postop course, and   expectations were all   discussed and reviewed.  Consent was obtained for benefit of pain   relief.      PROCEDURE IN DETAIL:  The patient was brought to the operative theater.   Once adequate anesthesia, preoperative antibiotics, 900mg  of Clindamycin administered, the patient was positioned supine with the right thigh tourniquet placed.  The  right lower extremity was prepped and draped in sterile fashion.  A time-   out was performed identifying the patient, planned procedure, and   extremity.      The right lower extremity was placed in the [facility] leg holder.  The leg was   exsanguinated, tourniquet elevated to 250 mmHg.  A midline incision was   made followed by median parapatellar arthrotomy.  Following initial   exposure, attention was first  directed to the patella.  Precut   measurement was noted to be 21 mm.  I resected down to 14 mm and used a   32 patellar button to restore patellar height as well as cover the cut   surface.      The lug holes were drilled and a metal shim was placed to protect the   patella from retractors and saw blades.      At this point, attention was now directed to the femur.  The femoral   canal was opened with a drill, irrigated to try to prevent fat emboli.  An   intramedullary rod was passed at 5 degrees valgus, 9 mm of bone was   resected off the distal femur.  Following this resection, the tibia was   subluxated anteriorly.  Using the extramedullary guide, 4 mm of bone was resected off   the proximal lateral tibia.  We confirmed the gap would be   stable medially and laterally with a 10 mm insert as well as confirmed   the cut was perpendicular in the coronal plane, checking with an alignment rod.      Once this was done, I sized the femur to be a size 2 in the anterior-   posterior dimension, chose a standard component based on medial and   lateral dimension.  The size 2 rotation block was then pinned in    position anterior referenced using the C-clamp to set rotation.  The   anterior, posterior, and  chamfer cuts were made without difficulty nor   notching making certain that I was along the anterior cortex to help   with flexion gap stability.      The final box cut was made off the lateral aspect of distal femur.      At this point, the tibia was sized to be a size 1.5, the size 1.5 tray was   then pinned in position through the medial third of the tubercle,   drilled, and keel punched.  Trial reduction was now carried with a 2 femur,  1.5 tibia, a 12.5 mm insert, and the 32 patella botton.  The knee was brought to   extension, full extension with good flexion stability with the patella   tracking through the trochlea without application of pressure.  Given   all these findings, the trial components removed.  Final components were   opened and cement was mixed.  The knee was irrigated with normal saline   solution and pulse lavage.  The synovial lining was   then injected with 0.25% Marcaine with epinephrine and 1 cc of Toradol,   total of 61 cc.      The knee was irrigated.  Final implants were then cemented onto clean and   dried cut surfaces of bone with the knee brought to extension with a 12.5 mm trial insert.      Once the cement had fully cured, the excess cement was removed   throughout the knee.  I confirmed I was satisfied with the range of   motion and stability, and the final 12.5 mm PS insert to match the size 2 femur was chosen.  It was   placed into the knee.      The tourniquet had been let down at 36 minutes.  No significant   hemostasis required.  The medium Hemovac drain was placed deep.  The   extensor mechanism was then reapproximated using #1 Vicryl with the knee  in flexion.  The   remaining wound was closed with 2-0 Vicryl and running 4-0 Monocryl.   The knee was cleaned, dried, dressed sterilely using Dermabond and   Aquacel dressing.  Drain site dressed  separately.  The patient was then   brought to recovery room in stable condition, tolerating the procedure   well.   Please note that Physician Assistant, Lanney Gins, was present for the entirety of the case, and was utilized for pre-operative positioning, peri-operative retractor management, general facilitation of the procedure.  He was also utilized for primary wound closure at the end of the case.              [name], M.D.

## 2012-05-31 NOTE — Transfer of Care (Signed)
Immediate Anesthesia Transfer of Care Note  Patient: [name]  Procedure(s) Performed: Procedure(s): RIGHT TOTAL KNEE ARTHROPLASTY (Right)  Patient Location: PACU  Anesthesia Type:Regional  Level of Consciousness: awake, alert  and oriented  Airway & Oxygen Therapy: Patient Spontanous Breathing and Patient connected to face mask oxygen  Post-op Assessment: Report given to PACU RN and Post -op Vital signs reviewed and stable  Post vital signs: Reviewed and stable  Complications: No apparent anesthesia complications

## 2012-05-31 NOTE — Anesthesia Procedure Notes (Addendum)
Anesthesia Regional Block:   Narrative:    Spinal

## 2012-05-31 NOTE — Anesthesia Postprocedure Evaluation (Signed)
  Anesthesia Post-op Note  Patient: [name]  Procedure(s) Performed: Procedure(s) (LRB): RIGHT TOTAL KNEE ARTHROPLASTY (Right)  Patient Location: PACU  Anesthesia Type: spinal  Level of Consciousness: awake and alert   Airway and Oxygen Therapy: Patient Spontanous Breathing  Post-op Pain: mild  Post-op Assessment: Post-op Vital signs reviewed, Patient's Cardiovascular Status Stable, Respiratory Function Stable, Patent Airway and No signs of Nausea or vomiting  Last Vitals:  Filed Vitals:   05/31/12 1200  BP: 111/58  Pulse: 70  Temp:   Resp: 22    Post-op Vital Signs: stable   Complications: No apparent anesthesia complications

## 2012-05-31 NOTE — Interval H&P Note (Signed)
History and Physical Interval Note:  05/31/2012 6:55 AM  [name]  has presented today for surgery, with the diagnosis of RIGHT KNEE OSTEOARTHRITIS    The various methods of treatment have been discussed with the patient and family. After consideration of risks, benefits and other options for treatment, the patient has consented to  Procedure(s): RIGHT TOTAL KNEE ARTHROPLASTY (Right) as a surgical intervention .  The patient's history has been reviewed, patient examined, no change in status, stable for surgery.  I have reviewed the patient's chart and labs.  Questions were answered to the patient's satisfaction.     Shelda Pal

## 2012-06-01 ENCOUNTER — Encounter (HOSPITAL_COMMUNITY): Payer: Self-pay | Admitting: Orthopedic Surgery

## 2012-06-01 DIAGNOSIS — E669 Obesity, unspecified: Secondary | ICD-10-CM | POA: Diagnosis present

## 2012-06-01 DIAGNOSIS — D500 Iron deficiency anemia secondary to blood loss (chronic): Secondary | ICD-10-CM | POA: Diagnosis not present

## 2012-06-01 LAB — BASIC METABOLIC PANEL
BUN: 16 mg/dL (ref 6–23)
Chloride: 105 mEq/L (ref 96–112)
GFR calc Af Amer: >90 mL/min (ref >90)
Potassium: 3.9 mEq/L (ref 3.5–5.1)

## 2012-06-01 LAB — CBC
HCT: 35.3 % — ABNORMAL LOW (ref 36.0–46.0)
RDW: 13 % (ref 11.5–15.5)
WBC: 16.1 10*3/uL — ABNORMAL HIGH (ref 4.0–10.5)

## 2012-06-01 MED ORDER — ALBUTEROL SULFATE (5 MG/ML) 0.5% IN NEBU
2.5000 mg | INHALATION_SOLUTION | Freq: Four times a day (QID) | RESPIRATORY_TRACT | Status: DC
  Administered 2012-06-01 – 2012-06-03 (×6): 2.5 mg via RESPIRATORY_TRACT
  Filled 2012-06-01 (×6): qty 0.5

## 2012-06-01 MED ORDER — TRAMADOL HCL 50 MG PO TABS
50.0000 mg | ORAL_TABLET | Freq: Four times a day (QID) | ORAL | Status: DC
  Administered 2012-06-01 – 2012-06-03 (×3): 50 mg via ORAL
  Filled 2012-06-01 (×4): qty 2
  Filled 2012-06-01: qty 1
  Filled 2012-06-01: qty 2
  Filled 2012-06-01 (×2): qty 1
  Filled 2012-06-01 (×3): qty 2

## 2012-06-01 NOTE — Progress Notes (Signed)
Patient on venti mask; sats continue to drop to 81-84%.  OT reported sats were 76% on room air when the patient ambulated to the bathroom.  Contacted [place], [place], no new orders - will call respiratory to evaluation need for Albuterol treatment. Jarold Motto, RN 06/01/2012, [phone]

## 2012-06-01 NOTE — Progress Notes (Signed)
Physical Therapy Treatment Patient Details Name: [name] MRN: [id number] DOB: December 14, 1955 Today's Date: 06/01/2012 Time: 1500-1530 PT Time Calculation (min): 30 min  PT Assessment / Plan / Recommendation Comments on Treatment Session       Follow Up Recommendations  Home health PT     Does the patient have the potential to tolerate intense rehabilitation     Barriers to Discharge None      Equipment Recommendations  None recommended by PT    Recommendations for Other Services OT consult  Frequency 7X/week   Plan Discharge plan remains appropriate    Precautions / Restrictions Precautions Precautions: Knee;Fall Precaution Comments: watch O2 SATS.  Restrictions Weight Bearing Restrictions: No   Pertinent Vitals/Pain 3/10    Mobility  Bed Mobility Bed Mobility: Supine to Sit Supine to Sit: 1: +2 Total assist Supine to Sit: Patient Percentage: 50% Sit to Supine: 4: Min assist Details for Bed Mobility Assistance: cues for sequence and use of L LE to self assist Transfers Transfers: Sit to Stand;Stand to Sit Sit to Stand: 1: +2 Total assist Sit to Stand: Patient Percentage: 70% Stand to Sit: 1: +2 Total assist Stand to Sit: Patient Percentage: 70% Details for Transfer Assistance: cues for LE management and use of UEs to self assist Ambulation/Gait Ambulation/Gait Assistance: 4: Min assist;3: Mod assist Ambulation/Gait: Patient Percentage: 70% Ambulation Distance (Feet): 95 Feet (and 20 twice) Assistive device: Rolling walker Ambulation/Gait Assistance Details: cues for sequence, stride length, posture, position from RW  Gait Pattern: Step-to pattern;Decreased step length - right;Decreased step length - left;Step-through pattern;Decreased stance time - right Stairs: No    Exercises Total Joint Exercises Ankle Circles/Pumps: AROM;10 reps;Supine;Both Quad Sets: AROM;Both;10 reps;Supine Heel Slides: AAROM;10 reps;Right;Supine Straight Leg Raises: AROM;AAROM;10  reps;Supine;Right   PT Diagnosis: Difficulty walking  PT Problem List: Decreased activity tolerance;Obesity;Pain;Decreased strength;Decreased range of motion;Decreased knowledge of use of DME;Decreased mobility PT Treatment Interventions: DME instruction;Gait training;Stair training;Functional mobility training;Therapeutic activities;Therapeutic exercise;Patient/family education   PT Goals Acute Rehab PT Goals PT Goal Formulation: With patient Time For Goal Achievement: 06/08/12 Potential to Achieve Goals: Good Pt will go Supine/Side to Sit: with supervision PT Goal: Supine/Side to Sit - Progress: Goal set today Pt will go Sit to Supine/Side: with supervision PT Goal: Sit to Supine/Side - Progress: Goal set today Pt will go Sit to Stand: with supervision PT Goal: Sit to Stand - Progress: Goal set today Pt will go Stand to Sit: with supervision PT Goal: Stand to Sit - Progress: Goal set today Pt will Ambulate: 51 - 150 feet;with supervision;with rolling walker PT Goal: Ambulate - Progress: Goal set today Pt will Go Up / Down Stairs: 1-2 stairs;with min assist;with least restrictive assistive device PT Goal: Up/Down Stairs - Progress: Goal set today  Visit Information  Last PT Received On: 06/01/12 Assistance Needed: +1 PT/OT Co-Evaluation/Treatment: Yes    Subjective Data  Subjective: I think I did good Patient Stated Goal: Resume previous lifestyle with decreased pain   Cognition  Cognition Arousal/Alertness: Awake/alert Behavior During Therapy: WFL for tasks assessed/performed Overall Cognitive Status: Within Functional Limits for tasks assessed    Balance     End of Session PT - End of Session Equipment Utilized During Treatment: Gait belt Activity Tolerance: Patient tolerated treatment well Patient left: in bed;with call bell/phone within reach Nurse Communication: Mobility status   GP     [name] 06/01/2012, 3:39 PM

## 2012-06-01 NOTE — Progress Notes (Signed)
Received orders for rw and commode.  Per patient, she already has them at home.  No DME needs at this time.

## 2012-06-01 NOTE — Evaluation (Signed)
Physical Therapy Evaluation Patient Details Name: [name] MRN: [id number] DOB: 01-25-1955 Today's Date: 06/01/2012 Time: 4098-1191 PT Time Calculation (min): 37 min  PT Assessment / Plan / Recommendation Clinical Impression  Pt s/p R TKR presents with decreased R LE strength/ROM and post op pain limiting functional mobility.    PT Assessment  Patient needs continued PT services    Follow Up Recommendations  Home health PT    Does the patient have the potential to tolerate intense rehabilitation      Barriers to Discharge None      Equipment Recommendations  None recommended by PT    Recommendations for Other Services OT consult   Frequency 7X/week    Precautions / Restrictions Precautions Precautions: Knee;Fall Restrictions Weight Bearing Restrictions: No   Pertinent Vitals/Pain 5/10; premed, cold packs provided      Mobility  Bed Mobility Bed Mobility: Supine to Sit Supine to Sit: 1: +2 Total assist Supine to Sit: Patient Percentage: 50% Details for Bed Mobility Assistance: cues for sequence and use of L LE to self assist Transfers Transfers: Sit to Stand;Stand to Sit Sit to Stand: 1: +2 Total assist Sit to Stand: Patient Percentage: 70% Stand to Sit: 1: +2 Total assist Stand to Sit: Patient Percentage: 70% Details for Transfer Assistance: cues for LE management and use of UEs to self assist Ambulation/Gait Ambulation/Gait Assistance: 1: +2 Total assist Ambulation/Gait: Patient Percentage: 70% Ambulation Distance (Feet): 47 Feet Assistive device: Rolling walker Ambulation/Gait Assistance Details: cues for sequence, posture and position from RW Gait Pattern: Step-to pattern;Decreased step length - right;Decreased step length - left;Step-through pattern;Decreased stance time - right Stairs: No    Exercises Total Joint Exercises Ankle Circles/Pumps: AROM;10 reps;Supine;Both Quad Sets: AROM;Both;10 reps;Supine Heel Slides: AAROM;10  reps;Right;Supine Straight Leg Raises: AROM;AAROM;10 reps;Supine;Right   PT Diagnosis: Difficulty walking  PT Problem List: Decreased activity tolerance;Obesity;Pain;Decreased strength;Decreased range of motion;Decreased knowledge of use of DME;Decreased mobility PT Treatment Interventions: DME instruction;Gait training;Stair training;Functional mobility training;Therapeutic activities;Therapeutic exercise;Patient/family education   PT Goals Acute Rehab PT Goals PT Goal Formulation: With patient Time For Goal Achievement: 06/08/12 Potential to Achieve Goals: Good Pt will go Supine/Side to Sit: with supervision PT Goal: Supine/Side to Sit - Progress: Goal set today Pt will go Sit to Supine/Side: with supervision PT Goal: Sit to Supine/Side - Progress: Goal set today Pt will go Sit to Stand: with supervision PT Goal: Sit to Stand - Progress: Goal set today Pt will go Stand to Sit: with supervision PT Goal: Stand to Sit - Progress: Goal set today Pt will Ambulate: 51 - 150 feet;with supervision;with rolling walker PT Goal: Ambulate - Progress: Goal set today Pt will Go Up / Down Stairs: 1-2 stairs;with min assist;with least restrictive assistive device PT Goal: Up/Down Stairs - Progress: Goal set today  Visit Information  Last PT Received On: 06/01/12 Assistance Needed: +2    Subjective Data  Subjective: I think I did good Patient Stated Goal: Resume previous lifestyle with decreased pain   Prior Functioning  Home Living Lives With: Family Available Help at Discharge: Family Type of Home: House Home Access: Stairs to enter Secretary/administrator of Steps: 1 Entrance Stairs-Rails: None Home Layout: One level Home Adaptive Equipment: Walker - rolling Prior Function Level of Independence: Independent with assistive device(s) Able to Take Stairs?: Yes Communication Communication: No difficulties    Cognition  Cognition Arousal/Alertness: Awake/alert Behavior During  Therapy: WFL for tasks assessed/performed Overall Cognitive Status: Within Functional Limits for tasks assessed  Extremity/Trunk Assessment Right Upper Extremity Assessment RUE ROM/Strength/Tone: [facility] for tasks assessed Left Upper Extremity Assessment LUE ROM/Strength/Tone: WFL for tasks assessed Right Lower Extremity Assessment RLE ROM/Strength/Tone: Deficits RLE ROM/Strength/Tone Deficits: 3-/5 quads with AAROM at knee -10 - 50 Left Lower Extremity Assessment LLE ROM/Strength/Tone: WFL for tasks assessed   Balance    End of Session PT - End of Session Equipment Utilized During Treatment: Gait belt Activity Tolerance: Patient tolerated treatment well Patient left: in chair;with call bell/phone within reach;with family/visitor present Nurse Communication: Mobility status  GP     Kenna Seward 06/01/2012, 1:56 PM

## 2012-06-01 NOTE — Progress Notes (Signed)
Occupational Therapy Evaluation Patient Details Name: [name] MRN: [id number] DOB: 01/20/1955 Today's Date: 06/01/2012 Time: 4098-1191 OT Time Calculation (min): 36 min  OT Assessment / Plan / Recommendation Clinical Impression  Pt s/p R TKA. Hx of Turner's Syndrome. PTA, pt lived with Mom who provides assistance as needed. Pt apparently independent with ADL and had min A for tub bench transfers.Pt will benefit from skilled OT services to facilitate D/C home with mother assisting as needed. Will plan to work with pt/mother to max independence with ADL and mobility. During eval, pt desat to 76 on RA with activity. Applied venti mask > 1 min to rebound to 89/90. Nsg informed.    OT Assessment  Patient needs continued OT Services    Follow Up Recommendations  Home health OT    Barriers to Discharge None mother concerned about getting up step  Equipment Recommendations  None recommended by OT    Recommendations for Other Services    Frequency  Min 2X/week    Precautions / Restrictions Precautions Precautions: Knee;Fall Precaution Comments: watch O2 SATS.  Restrictions Weight Bearing Restrictions: No   Pertinent Vitals/Pain 4 knee pain    ADL  Eating/Feeding: Modified independent Where Assessed - Eating/Feeding: Edge of bed Grooming: Supervision/safety Where Assessed - Grooming: Supported standing Upper Body Bathing: Supervision/safety;Set up Where Assessed - Upper Body Bathing: Supported sitting Lower Body Bathing: Moderate assistance Where Assessed - Lower Body Bathing: Supported sit to stand Upper Body Dressing: Supervision/safety;Set up Where Assessed - Upper Body Dressing: Supported sitting Lower Body Dressing: Maximal assistance Where Assessed - Lower Body Dressing: Supported sit to Pharmacist, hospital: Minimal assistance Toilet Transfer Method: Other (comment) (ambulating) Acupuncturist: Comfort height toilet;Grab bars Toileting - Clothing  Manipulation and Hygiene: Supervision/safety Where Assessed - Toileting Clothing Manipulation and Hygiene: Sit to stand from 3-in-1 or toilet Equipment Used: Gait belt;Rolling walker Transfers/Ambulation Related to ADLs: Min A RW level (mod vc for sequencing and safety) ADL Comments: Decrease in funcitonal level. desats    OT Diagnosis: Generalized weakness;Acute pain  OT Problem List: Decreased strength;Decreased range of motion;Decreased activity tolerance;Cardiopulmonary status limiting activity;Pain;Obesity OT Treatment Interventions: Self-care/ADL training;DME and/or AE instruction;Therapeutic activities;Patient/family education   OT Goals Acute Rehab OT Goals OT Goal Formulation: With patient Time For Goal Achievement: 06/15/12 Potential to Achieve Goals: Good ADL Goals Pt Will Perform Lower Body Bathing: with min assist;with caregiver independent in assisting;Sit to stand from bed;Unsupported ADL Goal: Lower Body Bathing - Progress: Goal set today Pt Will Perform Lower Body Dressing: with min assist;with caregiver independent in assisting;Sit to stand from chair;Unsupported ADL Goal: Lower Body Dressing - Progress: Goal set today Pt Will Transfer to Toilet: with supervision;Ambulation;with DME;3-in-1 ADL Goal: Toilet Transfer - Progress: Goal set today Pt Will Perform Tub/Shower Transfer: with min assist;with caregiver independent in assisting;Transfer tub bench;Tub transfer;Ambulation;with DME ADL Goal: Tub/Shower Transfer - Progress: Goal set today  Visit Information  Last OT Received On: 06/01/12 Assistance Needed: +1    Subjective Data      Prior Functioning     Home Living Lives With: Family Available Help at Discharge: Family Type of Home: House Home Access: Stairs to enter Secretary/administrator of Steps: 1 Entrance Stairs-Rails: None Home Layout: One level Bathroom Shower/Tub: Engineer, manufacturing systems: Standard Bathroom Accessibility: Yes How  Accessible: Accessible via walker Home Adaptive Equipment: Walker - rolling;Tub transfer bench;Bedside commode/3-in-1 Prior Function Level of Independence: Independent with assistive device(s) Able to Take Stairs?: Yes Communication Communication: No difficulties;Other (comment) (premorbid)  Vision/Perception Vision - History Baseline Vision: Wears glasses all the time   Cognition  Cognition Arousal/Alertness: Awake/alert Behavior During Therapy: WFL for tasks assessed/performed Overall Cognitive Status: History of cognitive impairments - at baseline    Extremity/Trunk Assessment Right Upper Extremity Assessment RUE ROM/Strength/Tone: [facility] for tasks assessed Left Upper Extremity Assessment LUE ROM/Strength/Tone: WFL for tasks assessed Right Lower Extremity Assessment RLE ROM/Strength/Tone: Deficits RLE ROM/Strength/Tone Deficits: 3-/5 quads with AAROM at knee -10 - 50 Left Lower Extremity Assessment LLE ROM/Strength/Tone: WFL for tasks assessed     Mobility Bed Mobility Bed Mobility: Sit to Supine Supine to Sit: 3: Mod assist;HOB flat Supine to Sit: Patient Percentage: 70% Sit to Supine: 4: Min assist Details for Bed Mobility Assistance: vc for sequence Transfers Transfers: Sit to Stand;Stand to Sit Sit to Stand: 4: Min assist;With upper extremity assist;From chair/3-in-1 Sit to Stand: Patient Percentage: 70% Stand to Sit: 4: Min assist;With upper extremity assist;To toilet Stand to Sit: Patient Percentage: 70% Details for Transfer Assistance: vc for technique     Exercise Total Joint Exercises Ankle Circles/Pumps: AROM;10 reps;Supine;Both Quad Sets: AROM;Both;10 reps;Supine Heel Slides: AAROM;10 reps;Right;Supine Straight Leg Raises: AROM;AAROM;10 reps;Supine;Right   Balance Balance Balance Assessed:  (S RW level)   End of Session OT - End of Session Activity Tolerance: Patient tolerated treatment well Patient left: in bed;with call bell/phone within  reach Nurse Communication: Mobility status;Other (comment) (O2 Desat)  GO     Stratton Villwock,HILLARY 06/01/2012, 3:45 PM [facility], OTR/L  [phone] 06/01/2012

## 2012-06-01 NOTE — Progress Notes (Signed)
Utilization review completed.  

## 2012-06-01 NOTE — Progress Notes (Addendum)
   Subjective: 1 Day Post-Op Procedure(s) (LRB): RIGHT TOTAL KNEE ARTHROPLASTY (Right)   Patient reports pain as mild. Rapid response was call during the night, because of low O2 sats. She feels as if this has resolved and she feels much better.   Objective:   VITALS:   Filed Vitals:   06/01/12 0619  BP: 111/77  Pulse: 88  Temp: 97.2 F (36.2 C)  Resp: 16    Neurovascular intact Dorsiflexion/Plantar flexion intact Incision: dressing C/D/I No cellulitis present Compartment soft  LABS  Recent Labs  06/01/12 0419  HGB 11.4*  HCT 35.3*  WBC 16.1*  PLT 199     Recent Labs  06/01/12 0419  NA 141  K 3.9  BUN 16  CREATININE 0.63  GLUCOSE 138*     Assessment/Plan: 1 Day Post-Op Procedure(s) (LRB): RIGHT TOTAL KNEE ARTHROPLASTY (Right) HV drain d/c'ed Foley cath d/c'ed Changed from Norco to tramadol, to help prevent breathing issues Advance diet Up with therapy D/C IV fluids Discharge home with home health eventually, when ready  Expected ABLA  Treated with iron and will observe  Obese (BMI 30-39.9) Estimated body mass index is 34.86 kg/(m^2) as calculated from the following:   Height as of this encounter: 4\' 7"  (1.397 m).   Weight as of this encounter: 68.04 kg (150 lb). Patient also counseled that weight may inhibit the healing process Patient counseled that losing weight will help with future health issues     Anastasio Auerbach. Stesha Neyens   PAC  06/01/2012, 9:23 AM

## 2012-06-01 NOTE — Significant Event (Signed)
Rapid Response Event Note  Overview: Time Called: 0205 Arrival Time: 0210 Event Type: Respiratory  Initial Focused Assessment:Pt had surgery 5/27 had received pain med IV within the last two hours and has hx of sleep apnea. Sats dropped and RRT call made. Upon arrival pt arousable has a simple mask from PACU at 5L with sats 91%.Pt drowsy but appropriate. Pt mother at bedside  Interventions:NRB on briefly and left on 35% venti mask with sats >92 consistently. Md paged to update by bedside RN. Bedside sat monitoring on.   Event Summary:   at      at    Outcome: Stayed in room and stabalized     Bayard Hugger

## 2012-06-02 ENCOUNTER — Telehealth: Payer: Self-pay | Admitting: Physician Assistant

## 2012-06-02 LAB — CBC
HCT: 32.1 % — ABNORMAL LOW (ref 36.0–46.0)
Hemoglobin: 10.4 g/dL — ABNORMAL LOW (ref 12.0–15.0)
WBC: 10.2 10*3/uL (ref 4.0–10.5)

## 2012-06-02 LAB — BASIC METABOLIC PANEL
BUN: 15 mg/dL (ref 6–23)
CO2: 33 mEq/L — ABNORMAL HIGH (ref 19–32)
Chloride: 102 mEq/L (ref 96–112)
Glucose, Bld: 174 mg/dL — ABNORMAL HIGH (ref 70–99)
Potassium: 3.8 mEq/L (ref 3.5–5.1)

## 2012-06-02 NOTE — Progress Notes (Signed)
Physical Therapy Treatment Patient Details Name: [name] MRN: [id number] DOB: Sep 17, 1955 Today's Date: 06/02/2012 Time: 8295-6213 PT Time Calculation (min): 40 min  PT Assessment / Plan / Recommendation Comments on Treatment Session       Follow Up Recommendations  Home health PT     Does the patient have the potential to tolerate intense rehabilitation     Barriers to Discharge        Equipment Recommendations  None recommended by PT    Recommendations for Other Services OT consult  Frequency 7X/week   Plan Discharge plan remains appropriate    Precautions / Restrictions Precautions Precautions: Knee;Fall Precaution Comments: watch O2 SATS.  Restrictions Weight Bearing Restrictions: No   Pertinent Vitals/Pain 4/10; pt declining pain meds    Mobility  Bed Mobility Bed Mobility: Supine to Sit Supine to Sit: 3: Mod assist;HOB flat Details for Bed Mobility Assistance: vc for sequence and use of L LE to self assist Transfers Transfers: Sit to Stand;Stand to Sit Sit to Stand: 4: Min assist Stand to Sit: 4: Min assist Details for Transfer Assistance: cues for LE management and use of UEs to self assist Ambulation/Gait Ambulation/Gait Assistance: 4: Min assist Ambulation Distance (Feet): 65 Feet Assistive device: Rolling walker Ambulation/Gait Assistance Details: cues for posture, pace, saftey awareness, sequence and position from RW Gait Pattern: Step-to pattern;Decreased step length - right;Decreased step length - left;Step-through pattern;Decreased stance time - right Stairs: No    Exercises Total Joint Exercises Ankle Circles/Pumps: AROM;Supine;Both;20 reps Quad Sets: AROM;Both;Supine;20 reps Heel Slides: AAROM;Right;Supine;20 reps Straight Leg Raises: AROM;AAROM;Supine;Right;20 reps Goniometric ROM: -10 - 40 AAROM    PT Diagnosis:    PT Problem List:   PT Treatment Interventions:     PT Goals Acute Rehab PT Goals PT Goal Formulation: With  patient Time For Goal Achievement: 06/08/12 Potential to Achieve Goals: Good Pt will go Supine/Side to Sit: with supervision PT Goal: Supine/Side to Sit - Progress: Progressing toward goal Pt will go Sit to Supine/Side: with supervision PT Goal: Sit to Supine/Side - Progress: Progressing toward goal Pt will go Sit to Stand: with supervision PT Goal: Sit to Stand - Progress: Progressing toward goal Pt will go Stand to Sit: with supervision PT Goal: Stand to Sit - Progress: Progressing toward goal Pt will Ambulate: 51 - 150 feet;with supervision;with rolling walker PT Goal: Ambulate - Progress: Progressing toward goal Pt will Go Up / Down Stairs: 1-2 stairs;with min assist;with least restrictive assistive device  Visit Information  Last PT Received On: 06/02/12 Assistance Needed: +1 PT/OT Co-Evaluation/Treatment: Yes    Subjective Data  Subjective: I only take pain medicine now and then Patient Stated Goal: Resume previous lifestyle with decreased pain   Cognition  Cognition Arousal/Alertness: Awake/alert Behavior During Therapy: WFL for tasks assessed/performed Overall Cognitive Status: Within Functional Limits for tasks assessed    Balance     End of Session PT - End of Session Equipment Utilized During Treatment: Gait belt Activity Tolerance: Patient tolerated treatment well Patient left: in chair;with call bell/phone within reach;with family/visitor present Nurse Communication: Mobility status   GP     Ty Oshima 06/02/2012, 1:26 PM

## 2012-06-02 NOTE — Plan of Care (Signed)
Problem: Consults Goal: Diagnosis- Total Joint Replacement Outcome: Completed/Met Date Met:  06/02/12 Primary Total Knee RIGHT     

## 2012-06-02 NOTE — Progress Notes (Signed)
Physical Therapy Treatment Patient Details Name: [name] MRN: [id number] DOB: 1955/04/20 Today's Date: 06/02/2012 Time: 1452-1530 PT Time Calculation (min): 38 min  PT Assessment / Plan / Recommendation Comments on Treatment Session  Pt ambulating on 3L O2 and maintaining sats above 92%    Follow Up Recommendations  Home health PT     Does the patient have the potential to tolerate intense rehabilitation     Barriers to Discharge        Equipment Recommendations  None recommended by PT    Recommendations for Other Services OT consult  Frequency 7X/week   Plan Discharge plan remains appropriate    Precautions / Restrictions Precautions Precautions: Knee;Fall Precaution Comments: watch O2 SATS.  Restrictions Weight Bearing Restrictions: No   Pertinent Vitals/Pain 4/10    Mobility  Bed Mobility Bed Mobility: Sit to Supine Supine to Sit: 3: Mod assist;HOB flat Sit to Supine: 4: Min assist Details for Bed Mobility Assistance: vc for sequence and use of L LE to self assist Transfers Transfers: Sit to Stand;Stand to Sit Sit to Stand: 4: Min assist Stand to Sit: 4: Min assist Details for Transfer Assistance: cues for LE management and use of UEs to self assist Ambulation/Gait Ambulation/Gait Assistance: 4: Min assist Ambulation Distance (Feet): 65 Feet (65' twice and 20') Assistive device: Rolling walker Ambulation/Gait Assistance Details: cues for pace, needed rest breaks 2* SOB, posture, position from RW, stride length Gait Pattern: Step-to pattern;Decreased step length - right;Decreased step length - left;Step-through pattern;Decreased stance time - right Stairs: No    Exercises     PT Diagnosis:    PT Problem List:   PT Treatment Interventions:     PT Goals Acute Rehab PT Goals PT Goal Formulation: With patient Time For Goal Achievement: 06/08/12 Potential to Achieve Goals: Good Pt will go Supine/Side to Sit: with supervision PT Goal: Supine/Side to  Sit - Progress: Progressing toward goal Pt will go Sit to Supine/Side: with supervision PT Goal: Sit to Supine/Side - Progress: Progressing toward goal Pt will go Sit to Stand: with supervision PT Goal: Sit to Stand - Progress: Progressing toward goal Pt will go Stand to Sit: with supervision PT Goal: Stand to Sit - Progress: Progressing toward goal Pt will Ambulate: 51 - 150 feet;with supervision;with rolling walker PT Goal: Ambulate - Progress: Progressing toward goal Pt will Go Up / Down Stairs: 1-2 stairs;with min assist;with least restrictive assistive device  Visit Information  Last PT Received On: 06/02/12 Assistance Needed: +1    Subjective Data  Patient Stated Goal: Resume previous lifestyle with decreased pain   Cognition  Cognition Arousal/Alertness: Awake/alert Behavior During Therapy: WFL for tasks assessed/performed Overall Cognitive Status: Within Functional Limits for tasks assessed    Balance  Balance Balance Assessed: Yes Dynamic Standing Balance Dynamic Standing - Level of Assistance: 4: Min assist  End of Session PT - End of Session Equipment Utilized During Treatment: Gait belt Activity Tolerance: Patient tolerated treatment well Patient left: in bed;with call bell/phone within reach;with family/visitor present Nurse Communication: Mobility status   GP     Ronni Osterberg 06/02/2012, 5:03 PM

## 2012-06-02 NOTE — Telephone Encounter (Signed)
Pt mother called because her daughter is currently hospitalized after a total knee replacement. During this hospitalization, she has had problems with low oxygen levels when asleep and the mother was told it was because she quits breathing. She has been put on CPAP at night and her mother is interested in obtaining this for home use.  Advised her mother that a sleep study is generally needed before insurance will cover this equipment. However, she should discuss the situation with her daughter's nurse and possibly with either a Child psychotherapist or case management. If there is a way for her to get CPAP temporarily and have a sleep study as an outpatient, and they will be the ones that know how to go about it. The mother stated she would talk to the nurse about it and explore these avenues.

## 2012-06-02 NOTE — Progress Notes (Signed)
Occupational Therapy Treatment Patient Details Name: [name] MRN: [id number] DOB: Feb 28, 1955 Today's Date: 06/02/2012 Time:  -     OT Assessment / Plan / Recommendation Comments on Treatment Session Worked on safety with functional transfers as well as standing balance to perform periarea hygiene. Pt's mother present and states she will help with LB ADL at home. She has a tubseat (not bench) and uses sit and pivot method to transfer into tub at home.     Follow Up Recommendations  Home health OT;Supervision/Assistance - 24 hour    Barriers to Discharge       Equipment Recommendations  None recommended by OT    Recommendations for Other Services    Frequency Min 2X/week   Plan Discharge plan remains appropriate    Precautions / Restrictions Precautions Precautions: Knee;Fall Precaution Comments: watch O2 SATS.  Restrictions Weight Bearing Restrictions: No       ADL  Lower Body Bathing: Performed;Minimal assistance;Other (comment) (pt washed periareas only in standing) Where Assessed - Lower Body Bathing: Supported sit to stand Toilet Transfer: Simulated;Minimal assistance Toilet Transfer Method: Other (comment);Sit to stand (walked in hallway with PT. see PT notes. chair pulled up) Equipment Used: Rolling walker ADL Comments: Pt's mother present. Pt very impulsive at times during session, trying to stand up before therapist ready and without warning. Pt encouraged to perform PLB during activity to help with O2 sats. Pt did wash own periareas in standing with min assist for balance and verbal cues for hand placement with standing and sitting.     OT Diagnosis:    OT Problem List:   OT Treatment Interventions:     OT Goals Acute Rehab OT Goals OT Goal Formulation: With patient Time For Goal Achievement: 06/15/12 Potential to Achieve Goals: Good ADL Goals Pt Will Perform Lower Body Bathing: with min assist;with caregiver independent in assisting;Sit to stand from  bed;Unsupported ADL Goal: Lower Body Bathing - Progress: Progressing toward goals Pt Will Perform Lower Body Dressing: with min assist;with caregiver independent in assisting;Sit to stand from chair;Unsupported Pt Will Transfer to Toilet: with supervision;Ambulation;with DME;3-in-1 ADL Goal: Toilet Transfer - Progress: Progressing toward goals Pt Will Perform Tub/Shower Transfer: with min assist;with caregiver independent in assisting;Transfer tub bench;Tub transfer;Ambulation;with DME  Visit Information  Last OT Received On: 06/02/12 Assistance Needed: +1    Subjective Data  Subjective: pt not verbalizing much on her own but agreeable to OT/PT Patient Stated Goal: as above   Prior Functioning       Cognition  Cognition Arousal/Alertness: Awake/alert Behavior During Therapy: WFL for tasks assessed/performed Overall Cognitive Status: Within Functional Limits for tasks assessed    Mobility  Bed Mobility Bed Mobility: Supine to Sit Supine to Sit: 3: Mod assist;HOB flat Details for Bed Mobility Assistance: vc for sequence and use of L LE to self assist Transfers Sit to Stand: 4: Min assist Stand to Sit: 4: Min assist Details for Transfer Assistance: cues for LE management and use of UEs to self assist       Balance Balance Balance Assessed: Yes Dynamic Standing Balance Dynamic Standing - Level of Assistance: 4: Min assist   End of Session OT - End of Session Equipment Utilized During Treatment: Gait belt Activity Tolerance: Patient tolerated treatment well;Other (comment) (although does desat with activity) Patient left: in chair;with call bell/phone within reach;with family/visitor present  GO     [name] [phone] 06/02/2012, 1:38 PM

## 2012-06-03 ENCOUNTER — Telehealth: Payer: Self-pay | Admitting: Cardiology

## 2012-06-03 MED ORDER — POLYETHYLENE GLYCOL 3350 17 G PO PACK: 17.0000 g | PACK | Freq: Two times a day (BID) | ORAL | Status: DC

## 2012-06-03 MED ORDER — ASPIRIN 325 MG PO TBEC: 325.0000 mg | DELAYED_RELEASE_TABLET | Freq: Two times a day (BID) | ORAL | Status: DC

## 2012-06-03 MED ORDER — FERROUS SULFATE 325 (65 FE) MG PO TABS: 325.0000 mg | ORAL_TABLET | Freq: Three times a day (TID) | ORAL | Status: DC

## 2012-06-03 MED ORDER — TRAMADOL HCL 50 MG PO TABS: 50.0000 mg | ORAL_TABLET | Freq: Four times a day (QID) | ORAL | Status: DC | PRN

## 2012-06-03 MED ORDER — DSS 100 MG PO CAPS: 100.0000 mg | ORAL_CAPSULE | Freq: Two times a day (BID) | ORAL | Status: DC

## 2012-06-03 MED ORDER — METHOCARBAMOL 500 MG PO TABS: 500.0000 mg | ORAL_TABLET | Freq: Four times a day (QID) | ORAL | Status: DC | PRN

## 2012-06-03 NOTE — Progress Notes (Signed)
Physical Therapy Treatment Patient Details Name: [name] MRN: [id number] DOB: 07-04-1955 Today's Date: 06/03/2012 Time: 6213-0865 PT Time Calculation (min): 32 min  PT Assessment / Plan / Recommendation Comments on Treatment Session  O2 @ 2L with pt maintaining sats above 92%    Follow Up Recommendations  Home health PT     Does the patient have the potential to tolerate intense rehabilitation     Barriers to Discharge        Equipment Recommendations  None recommended by PT    Recommendations for Other Services OT consult  Frequency 7X/week   Plan Discharge plan remains appropriate    Precautions / Restrictions Precautions Precautions: Knee;Fall Precaution Comments: watch O2 SATS.  Restrictions Weight Bearing Restrictions: No   Pertinent Vitals/Pain     Mobility  Bed Mobility Bed Mobility: Supine to Sit Supine to Sit: 4: Min assist;HOB elevated Details for Bed Mobility Assistance: vc for sequence and use of L LE to self assist Transfers Transfers: Sit to Stand;Stand to Sit Sit to Stand: 4: Min guard Stand to Sit: 4: Min guard Details for Transfer Assistance: cues for LE management and use of UEs to self assist Ambulation/Gait Ambulation/Gait Assistance: 4: Min assist Ambulation Distance (Feet): 20 Feet Assistive device: Rolling walker Ambulation/Gait Assistance Details: cues for posture, position from RW, and sequence Gait Pattern: Step-to pattern;Shuffle;Trunk flexed General Gait Details: LTd by onset of bowel incontinence Stairs: No    Exercises Total Joint Exercises Ankle Circles/Pumps: AROM;Supine;Both;20 reps Quad Sets: AROM;Both;Supine;15 reps Heel Slides: AAROM;Right;Supine;20 reps (increased muscle guarding) Straight Leg Raises: AROM;AAROM;Supine;Right;20 reps Goniometric ROM: -10 - 40   PT Diagnosis:    PT Problem List:   PT Treatment Interventions:     PT Goals Acute Rehab PT Goals PT Goal Formulation: With patient Time For Goal  Achievement: 06/08/12 Potential to Achieve Goals: Good Pt will go Supine/Side to Sit: with supervision PT Goal: Supine/Side to Sit - Progress: Progressing toward goal Pt will go Sit to Supine/Side: with supervision PT Goal: Sit to Supine/Side - Progress: Progressing toward goal Pt will go Sit to Stand: with supervision PT Goal: Sit to Stand - Progress: Progressing toward goal Pt will go Stand to Sit: with supervision PT Goal: Stand to Sit - Progress: Progressing toward goal Pt will Ambulate: 51 - 150 feet;with supervision;with rolling walker PT Goal: Ambulate - Progress: Progressing toward goal Pt will Go Up / Down Stairs: 1-2 stairs;with min assist;with least restrictive assistive device  Visit Information  Last PT Received On: 06/03/12 Assistance Needed: +1    Subjective Data  Subjective: Its not hurting too bad Patient Stated Goal: Resume previous lifestyle with decreased pain   Cognition  Cognition Arousal/Alertness: Awake/alert Behavior During Therapy: WFL for tasks assessed/performed Overall Cognitive Status: Within Functional Limits for tasks assessed    Balance     End of Session PT - End of Session Equipment Utilized During Treatment: Gait belt Activity Tolerance: Patient tolerated treatment well Patient left: Other (comment) (in bathroom with CNA assisting pt to bathe) Nurse Communication: Mobility status   GP     Hans Rusher 06/03/2012, 5:12 PM

## 2012-06-03 NOTE — Progress Notes (Addendum)
   Subjective: 2 Days Post-Op Procedure(s) (LRB): RIGHT TOTAL KNEE ARTHROPLASTY (Right)   Patient reports pain as mild, pain well controlled. Did well throughout the night when using the CPAP. Her O2 levels drop a little when not on a CPAP.  Objective:   VITALS:   Filed Vitals:   06/03/12 0645  BP: 108/69  Pulse: 105  Temp: 98.5 F (36.9 C)  Resp: 16    Neurovascular intact Dorsiflexion/Plantar flexion intact Incision: dressing C/D/I No cellulitis present Compartment soft  LABS  Recent Labs  06/01/12 0419 06/02/12 0410  HGB 11.4* 10.4*  HCT 35.3* 32.1*  WBC 16.1* 10.2  PLT 199 174     Recent Labs  06/01/12 0419 06/02/12 0410  NA 141 139  K 3.9 3.8  BUN 16 15  CREATININE 0.63 0.54  GLUCOSE 138* 174*     Assessment/Plan: 2 Days Post-Op Procedure(s) (LRB): RIGHT TOTAL KNEE ARTHROPLASTY (Right) Up with therapy Discharge home with home health eventually, when ready  Expected ABLA  Treated with iron and will observe   Obese (BMI 30-39.9)  Estimated body mass index is 34.86 kg/(m^2) as calculated from the following:      Height as of this encounter: 4\' 7"  (1.397 m).      Weight as of this encounter: 68.04 kg (150 lb).  Patient also counseled that weight may inhibit the healing process  Patient counseled that losing weight will help with future health issues       Anastasio Auerbach. Herron Fero   PAC  06/03/2012, 9:22 AM

## 2012-06-03 NOTE — Progress Notes (Signed)
   Subjective: 3 Days Post-Op Procedure(s) (LRB): RIGHT TOTAL KNEE ARTHROPLASTY (Right)   Patient reports pain as mild. Resting comfortable with CPAP in place. No events throughout the night. Ready to be discharged home.   Objective:   VITALS:   Filed Vitals:   06/03/12 0645  BP: 108/69  Pulse: 105  Temp: 98.5 F (36.9 C)  Resp: 16    Neurovascular intact Dorsiflexion/Plantar flexion intact Incision: dressing C/D/I No cellulitis present Compartment soft  LABS  Recent Labs  06/01/12 0419 06/02/12 0410  HGB 11.4* 10.4*  HCT 35.3* 32.1*  WBC 16.1* 10.2  PLT 199 174     Recent Labs  06/01/12 0419 06/02/12 0410  NA 141 139  K 3.9 3.8  BUN 16 15  CREATININE 0.63 0.54  GLUCOSE 138* 174*     Assessment/Plan: 3 Days Post-Op Procedure(s) (LRB): RIGHT TOTAL KNEE ARTHROPLASTY (Right) Will order home O2 use, and advised pt's mother that she will need to f/u with PCP to proceed with CPAP. Up with therapy Discharge home with home health Follow up in 2 weeks at [facility]. Follow up with OLIN,Belva Koziel D in 2 weeks.  Contact information:  [facility] [address], [address] [place] [address] [phone]    Obese (BMI 30-39.9) Estimated body mass index is 34.86 kg/(m^2) as calculated from the following:   Height as of this encounter: 4\' 7"  (1.397 m).   Weight as of this encounter: 68.04 kg (150 lb). Patient also counseled that weight may inhibit the healing process Patient counseled that losing weight will help with future health issues          Anastasio Auerbach. Jeyren Danowski   PAC  06/03/2012, 9:14 AM

## 2012-06-03 NOTE — Telephone Encounter (Signed)
Spoke with mother and patient in hospital, going home today. She just had knee replacement. She is going home on 2 L/min of oxygen. Mother states they are recommending sleep study. She will call back when patient is getting around better and will refer her to pulmonology.

## 2012-06-03 NOTE — Progress Notes (Signed)
OT Cancellation Note  Patient Details Name: [name] MRN: [id number] DOB: [birth year]   Cancelled Treatment:    Reason Eval/Treat Not Completed: Other (comment) (pt's mother states no further OT needs. Asked if pt/mother felt they needed to practice LB self care, toileting or tub transfer but pt's mother declines the need to practice. She states she helped a lot after pt had hip surgery so she is familiar with the techniques. Asked again about the tub transfer with the seat they own and the mom still declines the need to practice.)  [name] [phone] 06/03/2012, 11:55 AM

## 2012-06-03 NOTE — Telephone Encounter (Signed)
New problem   Need to find out how to get oxygen for pt at night

## 2012-06-03 NOTE — Progress Notes (Signed)
Physical Therapy Treatment Patient Details Name: [name] MRN: [id number] DOB: 1955-08-01 Today's Date: 06/03/2012 Time: 4098-1191 PT Time Calculation (min): 28 min  PT Assessment / Plan / Recommendation Comments on Treatment Session  O2 @ 2L with pt maintaining sats above 93%.  Pt to/from restroom and to hall to practice step.  Pt's mother states she will h ave her brother to assist her on going home    Follow Up Recommendations  Home health PT     Does the patient have the potential to tolerate intense rehabilitation     Barriers to Discharge        Equipment Recommendations  None recommended by PT    Recommendations for Other Services OT consult  Frequency 7X/week   Plan Discharge plan remains appropriate    Precautions / Restrictions Precautions Precautions: Knee;Fall Precaution Comments: watch O2 SATS.  Restrictions Weight Bearing Restrictions: No   Pertinent Vitals/Pain     Mobility  Bed Mobility Bed Mobility: Supine to Sit Supine to Sit: 4: Min assist;HOB elevated Details for Bed Mobility Assistance: vc for sequence and use of L LE to self assist Transfers Transfers: Sit to Stand;Stand to Sit Sit to Stand: 4: Min guard;From chair/3-in-1;With upper extremity assist;With armrests Stand to Sit: 4: Min guard Details for Transfer Assistance: cues for LE management and use of UEs to self assist Ambulation/Gait Ambulation/Gait Assistance: 4: Min guard Ambulation Distance (Feet): 54 Feet (and 20) Assistive device: Rolling walker Ambulation/Gait Assistance Details: cues for position from RW and posture Gait Pattern: Step-to pattern;Shuffle;Trunk flexed General Gait Details: LTd by onset of bowel incontinence Stairs: Yes Stairs Assistance: 4: Min assist Stairs Assistance Details (indicate cue type and reason): cues for sequence and foot/RW placement Stair Management Technique: No rails;Step to pattern;Forwards;With walker Number of Stairs: 1 (twice)     Exercises Total Joint Exercises Ankle Circles/Pumps: AROM;Supine;Both;20 reps Quad Sets: AROM;Both;Supine;15 reps Heel Slides: AAROM;Right;Supine;20 reps (increased muscle guarding) Straight Leg Raises: AROM;AAROM;Supine;Right;20 reps Goniometric ROM: -10 - 40   PT Diagnosis:    PT Problem List:   PT Treatment Interventions:     PT Goals Acute Rehab PT Goals PT Goal Formulation: With patient Time For Goal Achievement: 06/08/12 Potential to Achieve Goals: Good Pt will go Supine/Side to Sit: with supervision PT Goal: Supine/Side to Sit - Progress: Progressing toward goal Pt will go Sit to Supine/Side: with supervision PT Goal: Sit to Supine/Side - Progress: Progressing toward goal Pt will go Sit to Stand: with supervision PT Goal: Sit to Stand - Progress: Progressing toward goal Pt will go Stand to Sit: with supervision PT Goal: Stand to Sit - Progress: Progressing toward goal Pt will Ambulate: 51 - 150 feet;with supervision;with rolling walker PT Goal: Ambulate - Progress: Progressing toward goal Pt will Go Up / Down Stairs: 1-2 stairs;with min assist;with least restrictive assistive device PT Goal: Up/Down Stairs - Progress: Progressing toward goal  Visit Information  Last PT Received On: 06/03/12 Assistance Needed: +1    Subjective Data  Subjective: I'm going home today Patient Stated Goal: Resume previous lifestyle with decreased pain   Cognition  Cognition Arousal/Alertness: Awake/alert Behavior During Therapy: WFL for tasks assessed/performed Overall Cognitive Status: Within Functional Limits for tasks assessed    Balance     End of Session PT - End of Session Equipment Utilized During Treatment: Gait belt Activity Tolerance: Patient tolerated treatment well Patient left: in chair;with call bell/phone within reach;with family/visitor present Nurse Communication: Mobility status   GP  Elbie Statzer 06/03/2012, 5:17 PM

## 2012-06-03 NOTE — Care Management Note (Signed)
    Page 1 of 2   06/03/2012     3:27:52 PM   CARE MANAGEMENT NOTE 06/03/2012  Patient:  [name], [name]   Account Number:  [id number]  Date Initiated:  06/02/2012  Documentation initiated by:  [facility]  Subjective/Objective Assessment:   57 year old female admitted s/p right knee replacement.     Action/Plan:   HHPT services at d/c.   Anticipated DC Date:  06/05/2012   Anticipated DC Plan:  HOME W HOME HEALTH SERVICES      DC Planning Services  CM consult      PAC Choice  DURABLE MEDICAL EQUIPMENT  HOME HEALTH   Choice offered to / List presented to:  C-6 Parent   DME arranged  [facility]      DME agency  [facility]     HH arranged  HH-2 PT  HH-1 RN      [facility] agency  [facility]   Status of service:  Completed, signed off Medicare Important Message given?  NA - LOS <3 / Initial given by admissions (If response is "NO", the following Medicare IM given date fields will be blank) Date Medicare IM given:   Date Additional Medicare IM given:    Discharge Disposition:  HOME W HOME HEALTH SERVICES  Per UR Regulation:  Reviewed for med. necessity/level of care/duration of stay  If discussed at Long Length of Stay Meetings, dates discussed:    Comments:  06/03/2012 Dory Peru RN CCM [phone] Request for home O2; called order to [facility] DME rep. Plans are for patient to return home today with [facility] care in place with start date of tomorrow 06/04/2012.

## 2012-06-06 ENCOUNTER — Telehealth: Payer: Self-pay | Admitting: Cardiology

## 2012-06-06 DIAGNOSIS — R7981 Abnormal blood-gas level: Secondary | ICD-10-CM

## 2012-06-06 NOTE — Telephone Encounter (Signed)
New problem  Pt's mother is calling regarding the referral for a sleep study.

## 2012-06-07 NOTE — Telephone Encounter (Signed)
Discussed with  Dr. Patty Sermons and will refer to Dr Maple Hudson

## 2012-06-09 NOTE — Discharge Summary (Signed)
Physician Discharge Summary  Patient ID: [name] MRN: [id number] DOB/AGE: 08/04/1955 57 y.o.  Admit date: 05/31/2012 Discharge date: 06/03/2012   Procedures:  Procedure(s) (LRB): RIGHT TOTAL KNEE ARTHROPLASTY (Right)  Attending Physician:  Dr. Durene Romans   Admission Diagnoses:   Right knee OA / pain  Discharge Diagnoses:  Principal Problem:   S/P right TKA Active Problems:   Expected blood loss anemia   Obese  Past Medical History  Diagnosis Date  . Turner's syndrome   . Aortic insufficiency   . Hypothyroidism   . SOB (shortness of breath)   . Periorbital edema   . Hypertension   . Cervix abnormality 1993    MILD ATYPICAL ADENOMATOUS HYPERPLASIA OF CERVIX  . Dermatitis 05-26-12    all over  since 9'13  . Edema of both legs     right greater than left  . Speech disorder     occ. "stutter"  . Difficult intubation     will plan on spinal anesthesia"small mouth/airway"    HPI:   [name], 57 y.o. female, has a history of pain and functional disability in the right knee due to trauma and has failed non-surgical conservative treatments for greater than 12 weeks to includeNSAID's and/or analgesics, corticosteriod injections, use of assistive devices and activity modification. Onset of symptoms was gradual, starting >10 years ago with gradually worsening course since that time. The patient noted no past surgery on the right knee(s). Patient currently rates pain in the right knee(s) at 9 out of 10 with activity. Patient has worsening of pain with activity and weight bearing, pain that interferes with activities of daily living, pain with passive range of motion, crepitus and joint swelling. Patient has evidence of periarticular osteophytes and joint space narrowing by imaging studies. There is no active infection. Risks, benefits and expectations were discussed with the patient. Patient understand the risks, benefits and expectations and wishes to proceed with  surgery.  PCP: Cassell Clement, MD   Discharged Condition: good  Hospital Course:  Patient underwent the above stated procedure on 05/31/2012. Patient tolerated the procedure well and brought to the recovery room in good condition and subsequently to the floor.  POD #1 BP: 111/77 ; Pulse: 88 ; Temp: 97.2 F (36.2 C) ; Resp: 16 Pt's foley was removed, as well as the hemovac drain removed. IV was changed to a saline lock. Patient reports pain as mild. Rapid response was call during the night, because of low O2 sats. She feels as if this has resolved and she feels much better.  Neurovascular intact, dorsiflexion/plantar flexion intact, incision: dressing C/D/I, no cellulitis present and compartment soft.   LABS  Basename  06/01/12    0419   HGB  11.4  HCT  35.3   POD #2  BP: 108/69 ; Pulse: 105 ; Temp: 98.5 F (36.9 C) ; Resp: 16  Patient reports pain as mild, pain well controlled. Did well throughout the night when using the CPAP. Her O2 levels drop a little when not on a CPAP. Neurovascular intact, dorsiflexion/plantar flexion intact, incision: dressing C/D/I, no cellulitis present and compartment soft.   LABS  Basename  06/02/12    0410   HGB  10.4  HCT  32.1   POD #3  BP: 108/69 ; Pulse: 105 ; Temp: 98.5 F (36.9 C) ; Resp: 16 Patient reports pain as mild. Resting comfortable with CPAP in place. No events throughout the night. Ready to be discharged home.  Neurovascular  intact, dorsiflexion/plantar flexion intact, incision: dressing C/D/I, no cellulitis present and compartment soft.   LABS   No new labs  Discharge Exam: General appearance: alert, cooperative and no distress Extremities: Homans sign is negative, no sign of DVT, no edema, redness or tenderness in the calves or thighs and no ulcers, gangrene or trophic changes  Disposition:   Home-Health Care Svc with follow up in 2 weeks   Follow-up Information   Follow up with Shelda Pal, MD. Schedule an appointment  as soon as possible for a visit in 2 weeks.   Contact information:   [address] [place] [address] [phone]       Discharge Orders   Future Orders Complete By Expires     Call MD / Call 911  As directed     Comments:      If you experience chest pain or shortness of breath, CALL 911 and be transported to the hospital emergency room.  If you develope a fever above 101 F, pus (white drainage) or increased drainage or redness at the wound, or calf pain, call your surgeon's office.    Change dressing  As directed     Comments:      Maintain surgical dressing for 10-14 days, then change the dressing daily with sterile 4 x 4 inch gauze dressing and tape. Keep the area dry and clean.    Constipation Prevention  As directed     Comments:      Drink plenty of fluids.  Prune juice may be helpful.  You may use a stool softener, such as Colace (over the counter) 100 mg twice a day.  Use MiraLax (over the counter) for constipation as needed.    Diet - low sodium heart healthy  As directed     Discharge instructions  As directed     Comments:      Maintain surgical dressing for 10-14 days, then replace with gauze and tape. Keep the area dry and clean until follow up. Follow up in 2 weeks at [facility]. Call with any questions or concerns.    Driving restrictions  As directed     Comments:      No driving for 4 weeks    For home use only DME oxygen  As directed     Questions:      Mode or (Route):  Nasal cannula    Liters per Minute:  2    Frequency:  Only at night    Oxygen conserving device:      Increase activity slowly as tolerated  As directed     TED hose  As directed     Comments:      Use stockings (TED hose) for 2 weeks on both leg(s).  You may remove them at night for sleeping.    Weight bearing as tolerated  As directed          Medication List    TAKE these medications       aspirin 325 MG EC tablet  Take 1 tablet (325 mg total) by mouth  2 (two) times daily.     cholecalciferol 1000 UNITS tablet  Commonly known as:  VITAMIN D  Take 1,000 Units by mouth daily.     DSS 100 MG Caps  Take 100 mg by mouth 2 (two) times daily.     ferrous sulfate 325 (65 FE) MG tablet  Take 1 tablet (325 mg total) by mouth 3 (three) times  daily after meals.     furosemide 40 MG tablet  Commonly known as:  LASIX  Take 40 mg by mouth daily before breakfast.     levothyroxine 100 MCG tablet  Commonly known as:  SYNTHROID, LEVOTHROID  Take 100 mcg by mouth daily before breakfast.     methocarbamol 500 MG tablet  Commonly known as:  ROBAXIN  Take 1 tablet (500 mg total) by mouth every 6 (six) hours as needed (muscle spasms).     metoprolol 50 MG tablet  Commonly known as:  LOPRESSOR  Take 25 mg by mouth daily before breakfast. Take 1/2 tablet daily     polyethylene glycol packet  Commonly known as:  MIRALAX / GLYCOLAX  Take 17 g by mouth 2 (two) times daily.     potassium chloride 10 MEQ tablet  Commonly known as:  KLOR-CON M10  TAKE TWO TABLETS BY MOUTH TWICE DAILY     traMADol 50 MG tablet  Commonly known as:  ULTRAM  Take 1-2 tablets (50-100 mg total) by mouth every 6 (six) hours as needed for pain.     triamcinolone cream 0.5 %  Commonly known as:  KENALOG  Apply 1 application topically at bedtime.         Signed: Anastasio Auerbach. Jermell Holeman   PAC  06/09/2012, 10:46 AM

## 2012-06-09 NOTE — Telephone Encounter (Signed)
Per patient mother hospital recommending possible sleep study Appointment with Dr Sherene Sires June 16 at 11:15   Line busy will call tomorrow

## 2012-06-13 NOTE — Telephone Encounter (Signed)
Left message to call back  

## 2012-06-13 NOTE — Telephone Encounter (Signed)
New Problem  Pt is calling regarding the referral to a Pulmonary doctor. She asked if you could give her a call back.

## 2012-06-14 NOTE — Telephone Encounter (Signed)
Advised mother

## 2012-06-14 NOTE — Telephone Encounter (Signed)
New Problem  Pt's daughter is requesting for you to call her back. She did not say what it was concerning.

## 2012-06-20 ENCOUNTER — Institutional Professional Consult (permissible substitution): Payer: Medicaid Other | Admitting: Internal Medicine

## 2012-06-25 ENCOUNTER — Other Ambulatory Visit: Payer: Self-pay | Admitting: Cardiology

## 2012-07-05 ENCOUNTER — Ambulatory Visit: Payer: Medicaid Other | Attending: Orthopedic Surgery

## 2012-07-05 DIAGNOSIS — Z96659 Presence of unspecified artificial knee joint: Secondary | ICD-10-CM | POA: Insufficient documentation

## 2012-07-05 DIAGNOSIS — M6281 Muscle weakness (generalized): Secondary | ICD-10-CM | POA: Insufficient documentation

## 2012-07-05 DIAGNOSIS — M25569 Pain in unspecified knee: Secondary | ICD-10-CM | POA: Insufficient documentation

## 2012-07-05 DIAGNOSIS — IMO0001 Reserved for inherently not codable concepts without codable children: Secondary | ICD-10-CM | POA: Insufficient documentation

## 2012-07-05 DIAGNOSIS — R609 Edema, unspecified: Secondary | ICD-10-CM | POA: Insufficient documentation

## 2012-07-05 DIAGNOSIS — R262 Difficulty in walking, not elsewhere classified: Secondary | ICD-10-CM | POA: Insufficient documentation

## 2012-07-05 DIAGNOSIS — M25669 Stiffness of unspecified knee, not elsewhere classified: Secondary | ICD-10-CM | POA: Insufficient documentation

## 2012-07-06 ENCOUNTER — Ambulatory Visit (INDEPENDENT_AMBULATORY_CARE_PROVIDER_SITE_OTHER): Payer: Medicaid Other | Admitting: Internal Medicine

## 2012-07-06 ENCOUNTER — Encounter: Payer: Self-pay | Admitting: Internal Medicine

## 2012-07-06 VITALS — BP 110/60 | HR 80 | Temp 97.6°F | Ht <= 58 in | Wt 152.0 lb

## 2012-07-06 DIAGNOSIS — J9612 Chronic respiratory failure with hypercapnia: Secondary | ICD-10-CM | POA: Insufficient documentation

## 2012-07-06 DIAGNOSIS — J9610 Chronic respiratory failure, unspecified whether with hypoxia or hypercapnia: Secondary | ICD-10-CM

## 2012-07-06 HISTORY — DX: Chronic respiratory failure with hypercapnia: J96.12

## 2012-07-06 NOTE — Progress Notes (Signed)
Subjective:     Patient ID: [name], female   DOB: April 18, 1955  MRN: [id number]  HPI  70 yowf never smoker with Turner's syndrome went through [facility] with new problems with indolent onset minimally progressive doe and daytime sleepiness starting in 2013 referred by Dr Patty Sermons primary is Dr Malva Cogan at [facility]  Admit date: 05/31/2012  Discharge date: 06/03/2012  Procedures:  Procedure(s) (LRB):  RIGHT TOTAL KNEE ARTHROPLASTY (Right)  Attending Physician: Dr. Durene Romans  Admission Diagnoses: Right knee OA / pain  Discharge Diagnoses:  Principal Problem:  S/P right TKA  Active Problems:  Expected blood loss anemia  Obese    07/06/2012 1st pulmonary eval post hosp at [facility] started on 02 and sleeping better and chronic doe x room to room more limited by knee than sob. Daytime drowsiness x one year better since on 2 lpm s am HA  No obvious daytime variabilty or assoc chronic cough or cp or chest tightness, subjective wheeze overt sinus or hb symptoms. No unusual exp hx or h/o childhood pna/ asthma or knowledge of premature birth.   Sleeping better on 02  2lpm  without nocturnal  or early am exacerbation  of respiratory  c/o's or need for noct saba. Also denies any obvious fluctuation of symptoms with weather or environmental changes or other aggravating or alleviating factors except as outlined above   Current Medications, Allergies, Past Medical History, Past Surgical History, Family History, and Social History were reviewed in [employer] record.  ROS  The following are not active complaints unless bolded sore throat, dysphagia, dental problems, itching, sneezing,  nasal congestion or excess/ purulent secretions, ear ache,   fever, chills, sweats, unintended wt loss, pleuritic or exertional cp, hemoptysis,  orthopnea pnd or leg swelling chronic R leg min worse post op,  presyncope, palpitations, heartburn, abdominal pain, anorexia, nausea, vomiting, diarrhea   or change in bowel or urinary habits, change in stools or urine, dysuria,hematuria,  rash, arthralgias, visual complaints, headache, numbness weakness or ataxia or problems with walking or coordination,  change in mood/affect or memory.        Review of Systems     Objective:   Physical Exam amb obese wf short stature typical of Turner syndrome/ R leg swelling  Wt Readings from Last 3 Encounters:  07/06/12 152 lb (68.947 kg)  05/31/12 150 lb (68.04 kg)  05/31/12 150 lb (68.04 kg)      HEENT: nl dentition, turbinates, and orophanx. Nl external ear canals without cough reflex   NECK :  without JVD/Nodes/TM/ nl carotid upstrokes bilaterally   LUNGS: no acc muscle use, clear to A and P bilaterally without cough on insp or exp maneuvers   CV:  RRR  no s3 or murmur or increase in P2, R pitting edema  ABD:  soft and nontender with nl excursion in the supine position. No bruits or organomegaly, bowel sounds nl  MS:  warm without deformities, calf tenderness, cyanosis or clubbing - post op changes R knee  SKIN: warm and dry without lesions    NEURO:  alert, approp, no deficits  cxr 10/2011 No active disease.    Assessment:

## 2012-07-06 NOTE — Patient Instructions (Addendum)
Please see patient coordinator before you leave today  to schedule sleep study and if it is positive then we will refer you to sleep doctor.  No change in medications  Late add: After chart review : Needs f/u pfts' and cxr to complete the w/u and tsh /bmet on return to w/u hypercarbia

## 2012-07-09 NOTE — Assessment & Plan Note (Addendum)
-   05/2012 > 2lpm started at [facility] with HCO3 33-35 on mbets c/w hypercarbia.  The assoc hypercarbia and chronicity are typical of OHS and she is at risk of osa as well.  She's feeling better on 2lpm s am HA or AMS to suggest any worsening hypercabia on 02 so all she needs for now is repeat study on 2lpm and then referral to one of our sleep docs prn  Wt loss is the main treatment  She is also at high risk dvt/ pe though this would not fit her present chronologic/clinical picture.   After chart review : Needs f/u pfts' and cxr to complete the w/u and tsh [name]

## 2012-07-11 ENCOUNTER — Ambulatory Visit: Payer: Medicaid Other | Admitting: Physical Therapy

## 2012-07-12 ENCOUNTER — Telehealth: Payer: Self-pay | Admitting: Cardiology

## 2012-07-12 NOTE — Telephone Encounter (Signed)
Patient having several test in July/August and mother thinks  Dr. Patty Sermons may want results at [facility]. Will have patient come on 08/22/12 at 3:30

## 2012-07-12 NOTE — Telephone Encounter (Signed)
New Problem  Pt mother wants to speak with you regarding her daughter coming to see Dr Patty Sermons. She said that she is going to see some other doctors and she wants to discuss that with you too.

## 2012-07-13 ENCOUNTER — Ambulatory Visit: Payer: Medicaid Other

## 2012-07-18 ENCOUNTER — Ambulatory Visit: Payer: Medicaid Other | Admitting: Physical Therapy

## 2012-07-20 ENCOUNTER — Ambulatory Visit: Payer: Medicaid Other

## 2012-07-25 ENCOUNTER — Ambulatory Visit: Payer: Medicaid Other | Admitting: Physical Therapy

## 2012-07-27 ENCOUNTER — Ambulatory Visit: Payer: Medicaid Other

## 2012-07-27 ENCOUNTER — Other Ambulatory Visit: Payer: Self-pay | Admitting: *Deleted

## 2012-07-27 MED ORDER — FUROSEMIDE 40 MG PO TABS: 40.0000 mg | ORAL_TABLET | Freq: Every day | ORAL | Status: DC

## 2012-07-27 NOTE — Telephone Encounter (Signed)
Refill sent.

## 2012-08-01 ENCOUNTER — Encounter: Payer: Medicaid Other | Admitting: Physical Therapy

## 2012-08-02 ENCOUNTER — Encounter (HOSPITAL_BASED_OUTPATIENT_CLINIC_OR_DEPARTMENT_OTHER): Payer: Medicaid Other

## 2012-08-05 ENCOUNTER — Ambulatory Visit: Payer: Medicaid Other | Admitting: Physical Therapy

## 2012-08-08 ENCOUNTER — Telehealth: Payer: Self-pay | Admitting: Internal Medicine

## 2012-08-08 ENCOUNTER — Ambulatory Visit: Payer: Medicaid Other | Attending: Orthopedic Surgery | Admitting: Physical Therapy

## 2012-08-08 DIAGNOSIS — IMO0001 Reserved for inherently not codable concepts without codable children: Secondary | ICD-10-CM | POA: Insufficient documentation

## 2012-08-08 DIAGNOSIS — M6281 Muscle weakness (generalized): Secondary | ICD-10-CM | POA: Insufficient documentation

## 2012-08-08 DIAGNOSIS — R262 Difficulty in walking, not elsewhere classified: Secondary | ICD-10-CM | POA: Insufficient documentation

## 2012-08-08 DIAGNOSIS — M25569 Pain in unspecified knee: Secondary | ICD-10-CM | POA: Insufficient documentation

## 2012-08-08 DIAGNOSIS — M25669 Stiffness of unspecified knee, not elsewhere classified: Secondary | ICD-10-CM | POA: Insufficient documentation

## 2012-08-08 NOTE — Telephone Encounter (Signed)
LMTCB x 1 

## 2012-08-08 NOTE — Telephone Encounter (Signed)
Pt mother called back. She stated pt missed sleep study and is r/s to 08/29/12. Pt has appt next week for PFT and OV w/ MW she is wanting to know if she needs to r/s this since pt did not have sleep study yet. Please advise MW thanks

## 2012-08-08 NOTE — Telephone Encounter (Signed)
Yes - should keep pft appt

## 2012-08-09 NOTE — Telephone Encounter (Signed)
ATC patients mother at # provided No answer, LMOMTCB

## 2012-08-09 NOTE — Telephone Encounter (Signed)
Spoke with patients mother Made her aware to keep patients PFT appt appt date/time verified and nothing further needed at this time

## 2012-08-10 ENCOUNTER — Ambulatory Visit: Payer: Medicaid Other | Admitting: Physical Therapy

## 2012-08-15 ENCOUNTER — Ambulatory Visit (INDEPENDENT_AMBULATORY_CARE_PROVIDER_SITE_OTHER)
Admission: RE | Admit: 2012-08-15 | Discharge: 2012-08-15 | Disposition: A | Discharge status: Home / Self Care | Payer: Medicaid Other | Source: Ambulatory Visit | Attending: Internal Medicine | Admitting: Internal Medicine

## 2012-08-15 ENCOUNTER — Other Ambulatory Visit (INDEPENDENT_AMBULATORY_CARE_PROVIDER_SITE_OTHER): Payer: Medicaid Other

## 2012-08-15 ENCOUNTER — Ambulatory Visit (INDEPENDENT_AMBULATORY_CARE_PROVIDER_SITE_OTHER): Payer: Medicaid Other | Admitting: Internal Medicine

## 2012-08-15 ENCOUNTER — Encounter (INDEPENDENT_AMBULATORY_CARE_PROVIDER_SITE_OTHER): Payer: Medicaid Other

## 2012-08-15 ENCOUNTER — Encounter: Payer: Self-pay | Admitting: Internal Medicine

## 2012-08-15 VITALS — BP 136/86 | HR 80 | Temp 98.0°F | Ht <= 58 in | Wt 150.0 lb

## 2012-08-15 DIAGNOSIS — J9610 Chronic respiratory failure, unspecified whether with hypoxia or hypercapnia: Secondary | ICD-10-CM

## 2012-08-15 DIAGNOSIS — E039 Hypothyroidism, unspecified: Secondary | ICD-10-CM

## 2012-08-15 LAB — CBC WITH DIFFERENTIAL/PLATELET
Basophils Relative: 0.2 % (ref 0.0–3.0)
Eosinophils Relative: 0.1 % (ref 0.0–5.0)
HCT: 51.6 % — ABNORMAL HIGH (ref 36.0–46.0)
Lymphs Abs: 1.3 10*3/uL (ref 0.7–4.0)
MCV: 98.7 fl (ref 78.0–100.0)
Monocytes Absolute: 1.2 10*3/uL — ABNORMAL HIGH (ref 0.1–1.0)
Platelets: 362 10*3/uL (ref 150.0–400.0)
WBC: 15.3 10*3/uL — ABNORMAL HIGH (ref 4.5–10.5)

## 2012-08-15 LAB — BASIC METABOLIC PANEL
BUN: 24 mg/dL — ABNORMAL HIGH (ref 6–23)
Chloride: 95 mEq/L — ABNORMAL LOW (ref 96–112)
Potassium: 4.1 mEq/L (ref 3.5–5.1)

## 2012-08-15 NOTE — Progress Notes (Signed)
Subjective:     Patient ID: [name], female   DOB: 1955/04/29  MRN: [id number]  HPI  26 yowf never smoker with Turner's syndrome went through [facility] with new problems with indolent onset minimally progressive doe and daytime sleepiness starting in 2013 referred by [name] primary is [name] at [facility]  Admit date: 05/31/2012  Discharge date: 06/03/2012  Procedures:  Procedure(s) (LRB):  RIGHT TOTAL KNEE ARTHROPLASTY (Right)  Attending Physician: [name]  Admission Diagnoses: Right knee OA / pain  Discharge Diagnoses:  Principal Problem:  S/P right TKA  Active Problems:  Expected blood loss anemia  Obese    07/06/2012 1st pulmonary eval post hosp at [facility] started on 02 and sleeping better and chronic doe x room to room more limited by knee than sob. Daytime drowsiness x one year better since on 2 lpm s am HA rec Please see patient coordinator before you leave today  to schedule sleep study and if it is positive then we will refer you to sleep doctor > not done yet No change in medications After chart review : Needs f/u pfts' and cxr to complete the w/u and tsh /bmet on return to w/u hypercarbia  08/15/2012 f/u ov/Tanekia Dean re hypercarbia  Chief Complaint  Patient presents with  . Follow-up    Pt unable to perform spirometry today. She reports her breathing is unchanged since her last visit, no new co's today.   no am ha or cough or limiting sob   No obvious daytime variabilty or assoc chronic cough or cp or chest tightness, subjective wheeze overt sinus or hb symptoms. No unusual exp hx or h/o childhood pna/ asthma or knowledge of premature birth.   Sleeping better on 02  2lpm  without nocturnal  or early am exacerbation  of respiratory  c/o's or need for noct saba. Also denies any obvious fluctuation of symptoms with weather or environmental changes or other aggravating or alleviating factors except as outlined above   Current Medications, Allergies, Past  Medical History, Past Surgical History, Family History, and Social History were reviewed in [employer] record.  ROS  The following are not active complaints unless bolded sore throat, dysphagia, dental problems, itching, sneezing,  nasal congestion or excess/ purulent secretions, ear ache,   fever, chills, sweats, unintended wt loss, pleuritic or exertional cp, hemoptysis,  orthopnea pnd or leg swelling chronic R leg min worse post op,  presyncope, palpitations, heartburn, abdominal pain, anorexia, nausea, vomiting, diarrhea  or change in bowel or urinary habits, change in stools or urine, dysuria,hematuria,  rash, arthralgias, visual complaints, headache, numbness weakness or ataxia or problems with walking or coordination,  change in mood/affect or memory.              Objective:   Physical Exam amb obese wf short stature typical of Turner syndrome/ R leg in splint  08/15/2012      150  Wt Readings from Last 3 Encounters:  07/06/12 152 lb (68.947 kg)  05/31/12 150 lb (68.04 kg)  05/31/12 150 lb (68.04 kg)      HEENT: nl dentition, turbinates, and orophanx. Nl external ear canals without cough reflex   NECK :  without JVD/Nodes/TM/ nl carotid upstrokes bilaterally   LUNGS: no acc muscle use, clear to A and P bilaterally without cough on insp or exp maneuvers   CV:  RRR  no s3 or murmur or increase in P2, R pitting edema  ABD:  soft and nontender with nl excursion in the supine position. No bruits or organomegaly, bowel sounds nl  MS:  warm without deformities, calf tenderness, cyanosis or clubbing - post op changes R knee  SKIN: warm and dry without lesions       CXR  08/15/2012 :  No acute infiltrate or pleural effusion. Cardiomegaly. Left basilar atelectasis or scarring.  08/15/12 tsh nl, HC03 33    Assessment:

## 2012-08-15 NOTE — Patient Instructions (Addendum)
Please remember to go to the lab and x-ray department downstairs for your tests - we will call you with the results when they are available.      

## 2012-08-16 ENCOUNTER — Ambulatory Visit: Payer: Medicaid Other | Admitting: Physical Therapy

## 2012-08-16 ENCOUNTER — Telehealth: Payer: Self-pay | Admitting: Internal Medicine

## 2012-08-16 NOTE — Assessment & Plan Note (Addendum)
-   05/2012 > 2lpm started at [facility] with HCO3 33-35 on bmets c/w hypercarbia. - TSH  08/15/2012  wnl  Chronic well compensated with the only option adding bipap eventually if not able to lose wt.  In meantime awaiting PSS to r/u OSA

## 2012-08-16 NOTE — Progress Notes (Signed)
Quick Note:  Spoke with pt and notified of results per Dr. Wert. Pt verbalized understanding and denied any questions.  ______ 

## 2012-08-16 NOTE — Telephone Encounter (Signed)
LMTCB for pt's mother 

## 2012-08-16 NOTE — Telephone Encounter (Signed)
Pt's mother [name] returned call.  Spoke with [name] who is requesting labs and cxr results.  [name] had spoke with patient earlier today, but mother would like them as well.  DPR is on file for [name].  [name] of lab and cxr results/recs as stated MW below.  [name] verbalized her understanding and denied any questions.  Nothing further needed; will sign off.  Notes Recorded by Nyoka Cowden, MD on 08/15/2012 at 8:54 PM Call pt:  Reviewed cxr and no acute change so no change in recommendations made at [facility]  Notes Recorded by Nyoka Cowden, MD on 08/15/2012 at 8:53 PM Call patient :  Studies are unremarkable, no change in recs

## 2012-08-16 NOTE — Assessment & Plan Note (Signed)
Lab Results  Component Value Date   TSH 1.46 08/15/2012     Adequate control on present rx, reviewed > no change in rx needed

## 2012-08-22 ENCOUNTER — Encounter: Payer: Self-pay | Admitting: Cardiology

## 2012-08-22 ENCOUNTER — Ambulatory Visit (INDEPENDENT_AMBULATORY_CARE_PROVIDER_SITE_OTHER): Payer: Medicaid Other | Admitting: Cardiology

## 2012-08-22 VITALS — BP 124/74 | HR 80 | Ht <= 58 in | Wt 150.1 lb

## 2012-08-22 DIAGNOSIS — I119 Hypertensive heart disease without heart failure: Secondary | ICD-10-CM

## 2012-08-22 DIAGNOSIS — I359 Nonrheumatic aortic valve disorder, unspecified: Secondary | ICD-10-CM

## 2012-08-22 DIAGNOSIS — I10 Essential (primary) hypertension: Secondary | ICD-10-CM

## 2012-08-22 DIAGNOSIS — I351 Nonrheumatic aortic (valve) insufficiency: Secondary | ICD-10-CM

## 2012-08-22 NOTE — Assessment & Plan Note (Signed)
Blood pressure is remaining stable on current therapy. 

## 2012-08-22 NOTE — Progress Notes (Signed)
[name] Date of Birth:  27-May-1955 [facility] [address] [place], [place]  [address] [phone]  Fax   [phone]  HPI: This pleasant 57 year old woman is seen for a scheduled followup office visit. She has a past history of Turner's syndrome. She has a history of essential hypertension and a history of mild aortic insufficiency. She had a chest x-ray 10/07/11 showing borderline enlarged heart size. She had a two-dimensional echocardiogram 10/24/11 showing normal left ventricular systolic function with ejection fraction 55-60%. There was grade 2 diastolic dysfunction. There was mild aortic stenosis and there was moderate aortic insufficiency and there was biatrial enlargement.  The patient has significant osteoarthritis.  Since we last saw her she has had successful right total knee replacement by Dr. Charlann Boxer.  She has a history of questionable sleep apnea and is scheduled for a sleep study on 08/29/12.  She recently saw Dr. Sherene Sires in his office and her oxygen level at that time was improved at 95% on room air.  She still uses nasal oxygen at night at home.   Current Outpatient Prescriptions  Medication Sig Dispense Refill  . furosemide (LASIX) 40 MG tablet Take 1 tablet (40 mg total) by mouth daily before breakfast.  30 tablet  0  . levothyroxine (SYNTHROID, LEVOTHROID) 100 MCG tablet Take 100 mcg by mouth daily before breakfast.       . methocarbamol (ROBAXIN) 500 MG tablet Take 1 tablet (500 mg total) by mouth every 6 (six) hours as needed (muscle spasms).  50 tablet  0  . metoprolol (LOPRESSOR) 50 MG tablet Take 1/2 tablet daily      . potassium chloride (KLOR-CON M10) 10 MEQ tablet TAKE TWO TABLETS BY MOUTH TWICE DAILY  120 tablet  12  . traMADol (ULTRAM) 50 MG tablet Take 1-2 tablets (50-100 mg total) by mouth every 6 (six) hours as needed for pain.  80 tablet  0  . [DISCONTINUED] potassium chloride (K-DUR) 10 MEQ tablet TAKE ONE TABLET BY MOUTH TWICE DAILY   60 tablet  5   No current facility-administered medications for this visit.    Allergies  Allergen Reactions  . Cephalexin     Prefers not to have ? Psoriasis skin condition    Patient Active Problem List   Diagnosis Date Noted  . Chronic respiratory failure 07/06/2012  . Expected blood loss anemia 06/01/2012  . Obese 06/01/2012  . 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 Hyperglycemia 12/23/2011  . Orbital floor (blow-out) closed fracture 08/17/2011  . Hypertension   . Cervix abnormality   . 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 Turner syndrome 10/16/2010  . Hypothyroid 10/16/2010  . Aortic insufficiency 10/16/2010  . Osteoarthritis 10/16/2010    History  Smoking status  . Never Smoker   Smokeless tobacco  . Never Used    History  Alcohol Use No    Family History  Problem Relation Age of Onset  . Hypertension Mother   . Cancer Mother     SKIN CANCER  . Breast cancer Cousin     MATERNAL COUSIN  . Diabetes Maternal Uncle     Review of Systems: The patient denies any heat or cold intolerance.  No weight gain or weight loss.  The patient denies headaches or blurry vision.  There is no cough or sputum production.  The patient denies dizziness.  There is no hematuria or hematochezia.  The patient denies any muscle aches or arthritis.  The patient denies  any rash.  The patient denies frequent falling or instability.  There is no history of depression or anxiety.  All other systems were reviewed and are negative.   Physical Exam: Filed Vitals:   08/22/12 1525  BP: 124/74  Pulse: 80   the general appearance reveals a well-developed well-nourished woman who has the physical characteristics of Turner's syndrome.The head and neck exam reveals pupils equal and reactive.  Extraocular movements are full.  There is no scleral icterus.  The mouth and pharynx are normal.  The neck is supple.  The carotids reveal no bruits.  The jugular venous pressure is  normal.  The  thyroid is not enlarged.  There is no lymphadenopathy.  The chest is clear to percussion and auscultation.  There are no rales or rhonchi.  Expansion of the chest is symmetrical.  The precordium is quiet.  The first heart sound is normal.  The second heart sound is physiologically split.  There is very soft decrescendo diastolic murmur along the sternal edge.  There is no abnormal lift or heave.  The abdomen is soft and nontender.  The bowel sounds are normal.  The liver and spleen are not enlarged.  There are no abdominal masses.  There are no abdominal bruits.  Extremities reveal good pedal pulses.  There is no phlebitis and there is chronic edema of the right lower extremities more so than the left.  There is no cyanosis or clubbing.  Strength is normal and symmetrical in all extremities.  The incision from the right total knee replacement appears to be healing well.  There is no lateralizing weakness.  There are no sensory deficits.  The skin is warm and dry.  There is no rash.      Assessment / Plan: The patient is to continue same medication.  Await results of the sleep study.  She had a chest x-ray on 08/15/12 showing cardiomegaly but no CHF. Recheck here in 6 months for followup office visit and EKG.

## 2012-08-22 NOTE — Assessment & Plan Note (Signed)
The patient is not having any symptoms to suggest worsening of her aortic valve disease.

## 2012-08-22 NOTE — Patient Instructions (Addendum)
Your physician recommends that you continue on your current medications as directed. Please refer to the Current Medication list given to you today.  Your physician wants you to follow-up in: 6 month ov/ekg You will receive a reminder letter in the mail two months in advance. If you don't receive a letter, please call our office to schedule the follow-up appointment.  

## 2012-08-22 NOTE — Assessment & Plan Note (Signed)
The patient has chronic edema of the right lower extremity.  She has not been experiencing any chest pain and she has had less exertional dyspnea.  She has been able to lose 6 pounds since last visit which has helped her dyspnea

## 2012-08-23 ENCOUNTER — Encounter: Payer: Medicaid Other | Admitting: Physical Therapy

## 2012-08-26 ENCOUNTER — Encounter: Payer: Medicaid Other | Admitting: Physical Therapy

## 2012-08-29 ENCOUNTER — Ambulatory Visit (HOSPITAL_BASED_OUTPATIENT_CLINIC_OR_DEPARTMENT_OTHER): Payer: Medicaid Other | Attending: Internal Medicine | Admitting: Radiology

## 2012-08-29 VITALS — Ht <= 58 in | Wt 150.0 lb

## 2012-08-29 DIAGNOSIS — J9610 Chronic respiratory failure, unspecified whether with hypoxia or hypercapnia: Secondary | ICD-10-CM

## 2012-08-29 DIAGNOSIS — G4733 Obstructive sleep apnea (adult) (pediatric): Secondary | ICD-10-CM

## 2012-08-30 ENCOUNTER — Other Ambulatory Visit: Payer: Self-pay

## 2012-08-30 MED ORDER — FUROSEMIDE 40 MG PO TABS: 40.0000 mg | ORAL_TABLET | Freq: Every day | ORAL | Status: DC

## 2012-09-09 DIAGNOSIS — G4730 Sleep apnea, unspecified: Secondary | ICD-10-CM

## 2012-09-09 DIAGNOSIS — G4710 Hypersomnia, unspecified: Secondary | ICD-10-CM

## 2012-09-09 NOTE — Procedures (Signed)
NAMEJEANNY, RYMER NO.:  [id number]  MEDICAL RECORD NO.:  [id number]          PATIENT TYPE:  OUT  LOCATION:  SLEEP CENTER                 FACILITY:  [facility]  PHYSICIAN:  Barbaraann Share, MD,FCCPDATE OF BIRTH:  1955-12-09  DATE OF STUDY:  08/29/2012                           NOCTURNAL POLYSOMNOGRAM  REFERRING PHYSICIAN:  Charlaine Dalton. Sherene Sires, MD, FCCP  LOCATION:  Sleep Lab.  INDICATION FOR STUDY:  Hypersomnia with sleep apnea  EPWORTH SLEEPINESS SCORE:  8.  SLEEP ARCHITECTURE:  The patient had a total sleep time of only 93 minutes with no slow-wave sleep or REM.  Sleep onset latency was prolonged at 131 minutes, and sleep efficiency was extremely poor at 25%.  RESPIRATORY DATA:  The patient was found to have 85 obstructive apneas and 38 obstructive hypopneas, giving her an apnea-hypopnea index of 79 events per hour.  The events occurred in all body positions, and moderate snoring was noted throughout.  It should be noted that the patient also had Cheyne-Stokes respirations at times during the night.  OXYGEN DATA:  There was O2 desaturation as low as 67% with the patient's obstructive events.  CARDIAC DATA:  Occasional PAC and PVC noted.  MOVEMENTS/PARASOMNIA:  The patient had no significant periodic limb movements or abnormal behaviors.  IMPRESSION/RECOMMENDATION: 1. Severe obstructive sleep apnea/hypopnea syndrome, with an AHI of 79     events per hour and oxygen desaturation as low as 67%.  There were     also Cheyne-Stokes respirations noted at times during the night.     Treatment for this degree of sleep apnea should focus primarily on     CPAP as well as weight loss. 2. Occasional PAC and PVC noted, but no clinically significant     arrhythmias were noted.     Barbaraann Share, MD,FCCP Diplomate, American Board of Sleep Medicine   [facility]/MED[facility]  D:  09/09/2012 08:36:48  T:  09/09/2012 16:10:96  Job:  [id number]

## 2012-09-10 ENCOUNTER — Other Ambulatory Visit: Payer: Self-pay | Admitting: Internal Medicine

## 2012-09-10 ENCOUNTER — Encounter: Payer: Self-pay | Admitting: Internal Medicine

## 2012-09-10 DIAGNOSIS — G4733 Obstructive sleep apnea (adult) (pediatric): Secondary | ICD-10-CM

## 2012-09-10 HISTORY — DX: Obstructive sleep apnea (adult) (pediatric): G47.33

## 2012-09-12 ENCOUNTER — Telehealth: Payer: Self-pay | Admitting: Pulmonary Disease

## 2012-09-12 NOTE — Telephone Encounter (Signed)
I spoke with pt mother. Advised her [facility] was sent order this AM and does not document what type of machine she will be getting. She stated she just wants to make sure it was sent to [facility]. Nothing further needed

## 2012-10-14 ENCOUNTER — Encounter: Payer: Self-pay | Admitting: Pulmonary Disease

## 2012-10-14 ENCOUNTER — Ambulatory Visit (INDEPENDENT_AMBULATORY_CARE_PROVIDER_SITE_OTHER): Payer: Medicaid Other | Admitting: Pulmonary Disease

## 2012-10-14 VITALS — BP 110/72 | HR 79 | Temp 97.9°F | Ht <= 58 in | Wt 157.4 lb

## 2012-10-14 DIAGNOSIS — G4733 Obstructive sleep apnea (adult) (pediatric): Secondary | ICD-10-CM

## 2012-10-14 NOTE — Patient Instructions (Signed)
Will optimize your pressure on the automatic setting, and will let you know your optimal pressure once I receive your download. Work on weight loss followup with me in 6mos.

## 2012-10-14 NOTE — Assessment & Plan Note (Signed)
The patient has severe obstructive sleep apnea, and has been started on CPAP at a moderate pressure level.  She seems to be tolerating this well, and feels that her symptoms have definitely improved.  Her download shows excellent compliance, but her AHI is still significantly elevated.  She obviously will need more pressure, and we'll use the automatic setting on her device to achieve this.  I have also encouraged her to work aggressively on weight loss.

## 2012-10-14 NOTE — Progress Notes (Signed)
Subjective:    Patient ID: [name], female    DOB: 10/07/1955, 57 y.o.   MRN: [id number]  HPI The patient is a 57 year old female who I've been asked to see for management of obstructive sleep apnea.  She was recently diagnosed with severe OSA, with an AHI of 79 events per hour.  She was started on CPAP at 10 cm of water, and is using a full face mask.  A recent download shows excellent compliance, no significantly, but her AHI continues to be 36 per hour.  The patient states that she is doing much better with the CPAP device, and feels more rested.  However, she is still falling asleep during the day with inactivity according to her mother.  She feels that her mask is comfortable, and does not have excessive leaks.  The patient states that her weight is up about 10 pounds over the last year or so, and her Epworth score today is 12.     Sleep Questionnaire What time do you typically go to bed?( Between what hours) 12a-1a 12a-1a at 1439 on 10/14/12 by Maisie Fus, CMA How long does it take you to fall asleep? within minutes within minutes at 1439 on 10/14/12 by Maisie Fus, CMA How many times during the night do you wake up? 3 3 at 1439 on 10/14/12 by Maisie Fus, CMA What time do you get out of bed to start your day? No Value 830-10am at 1439 on 10/14/12 by Maisie Fus, CMA Do you drive or operate heavy machinery in your occupation? No No at 1439 on 10/14/12 by Maisie Fus, CMA How much has your weight changed (up or down) over the past two years? (In pounds) 10 lb (4.536 kg) 10 lb (4.536 kg) at 1439 on 10/14/12 by Maisie Fus, CMA Have you ever had a sleep study before? Yes Yes at 1439 on 10/14/12 by Maisie Fus, CMA If yes, [facility] 1439 on 10/14/12 by Maisie Fus, CMA If yes, date of study? 08/2012 08/2012 at 1439 on 10/14/12 by Maisie Fus, CMA Do you currently use CPAP? Yes Yes at 1439 on 10/14/12 by Maisie Fus,  CMA If so, what pressure? unsure unsure at 1439 on 10/14/12 by Maisie Fus, CMA Do you wear oxygen at any time? Yes Yes at 1439 on 10/14/12 by Maisie Fus, CMA O2 Flow Rate (L/min) 3 L/min 3 L/min at 1439 on 10/14/12 by Maisie Fus, CMA   Review of Systems  Constitutional: Positive for unexpected weight change. Negative for fever.  HENT: Negative for congestion, dental problem, ear pain, nosebleeds, postnasal drip, rhinorrhea, sinus pressure, sneezing, sore throat and trouble swallowing.   Eyes: Negative for redness and itching.  Respiratory: Negative for cough, chest tightness, shortness of breath and wheezing.   Cardiovascular: Negative for palpitations and leg swelling.  Gastrointestinal: Negative for nausea and vomiting.  Genitourinary: Negative for dysuria.  Musculoskeletal: Positive for arthralgias and joint swelling.  Skin: Positive for rash ( psoriasis).  Neurological: Negative for headaches.  Hematological: Does not bruise/bleed easily.  Psychiatric/Behavioral: Negative for dysphoric mood. The patient is not nervous/anxious.        Objective:   Physical Exam Constitutional:  Morbidly obese female, no acute distress  HENT:  Nares patent without discharge, but deviated septum to left with near obstruction.   Oropharynx without exudate, palate and uvula are thick and elongated.   Eyes:  Perrla, eomi,  no scleral icterus  Neck:  No JVD, no TMG  Cardiovascular:  Normal rate, regular rhythm, no rubs or gallops.  No murmurs        Intact distal pulses but diminished.  Pulmonary :  Normal breath sounds, no stridor or respiratory distress   No rales, rhonchi, or wheezing  Abdominal:  Soft, nondistended, bowel sounds present.  No tenderness noted.   Musculoskeletal:  2+ lower extremity edema noted, R>>L  Lymph Nodes:  No cervical lymphadenopathy noted  Skin:  No cyanosis noted  Neurologic:  Alert, appropriate, moves all 4 extremities without obvious  deficit.         Assessment & Plan:

## 2012-12-02 ENCOUNTER — Other Ambulatory Visit: Payer: Self-pay | Admitting: Cardiology

## 2012-12-24 ENCOUNTER — Other Ambulatory Visit: Payer: Self-pay | Admitting: Cardiology

## 2013-01-17 ENCOUNTER — Telehealth: Payer: Self-pay | Admitting: Pulmonary Disease

## 2013-01-17 NOTE — Telephone Encounter (Signed)
Called and spoke with pts mother and she is aware to check with [facility] to come out and check the cpap machine.  i advised her that the mask will blow but once the pt places the mask on her face.  She will try this again tonight and see if this works.  [facility] but they had 3 people out sick today.

## 2013-01-30 ENCOUNTER — Other Ambulatory Visit: Payer: Self-pay | Admitting: Cardiology

## 2013-02-07 ENCOUNTER — Other Ambulatory Visit: Payer: Self-pay | Admitting: Cardiology

## 2013-02-23 ENCOUNTER — Ambulatory Visit: Payer: Medicaid Other | Admitting: Cardiology

## 2013-03-04 ENCOUNTER — Other Ambulatory Visit: Payer: Self-pay | Admitting: Cardiology

## 2013-03-10 ENCOUNTER — Encounter: Payer: Self-pay | Admitting: Cardiology

## 2013-03-10 ENCOUNTER — Ambulatory Visit (INDEPENDENT_AMBULATORY_CARE_PROVIDER_SITE_OTHER): Payer: Medicaid Other | Admitting: Cardiology

## 2013-03-10 VITALS — BP 132/82 | HR 69 | Ht <= 58 in | Wt 153.0 lb

## 2013-03-10 DIAGNOSIS — Z96659 Presence of unspecified artificial knee joint: Secondary | ICD-10-CM

## 2013-03-10 DIAGNOSIS — R0609 Other forms of dyspnea: Secondary | ICD-10-CM

## 2013-03-10 DIAGNOSIS — I359 Nonrheumatic aortic valve disorder, unspecified: Secondary | ICD-10-CM

## 2013-03-10 DIAGNOSIS — I119 Hypertensive heart disease without heart failure: Secondary | ICD-10-CM

## 2013-03-10 DIAGNOSIS — R0989 Other specified symptoms and signs involving the circulatory and respiratory systems: Secondary | ICD-10-CM

## 2013-03-10 DIAGNOSIS — I351 Nonrheumatic aortic (valve) insufficiency: Secondary | ICD-10-CM

## 2013-03-10 NOTE — Assessment & Plan Note (Signed)
The patient has a long history of aortic insufficiency.  Her last echocardiogram was 10/24/11 showing normal LV function and moderate aortic insufficiency

## 2013-03-10 NOTE — Patient Instructions (Signed)
Your physician recommends that you continue on your current medications as directed. Please refer to the Current Medication list given to you today.  Your physician wants you to follow-up in: 6 month ov/bmet/cbc You will receive a reminder letter in the mail two months in advance. If you don't receive a letter, please call our office to schedule the follow-up appointment.

## 2013-03-10 NOTE — Progress Notes (Signed)
[name] Date of Birth:  [birth year] [address] [place], [place]  [address] [phone]  Fax   [phone]  HPI: This pleasant 58 year old woman is seen for a scheduled followup office visit. She has a past history of Turner's syndrome. She has a history of essential hypertension and a history of mild aortic insufficiency. She had a chest x-ray 10/07/11 showing borderline enlarged heart size. She had a two-dimensional echocardiogram 10/24/11 showing normal left ventricular systolic function with ejection fraction 55-60%. There was grade 2 diastolic dysfunction. There was mild aortic stenosis and there was moderate aortic insufficiency and there was biatrial enlargement.  The patient has significant osteoarthritis.  Since we last saw her she has had successful right total knee replacement by Dr. Charlann Boxer.  She has a history of sleep apnea and now has a CPAP machine which is working well for her.  She recently saw Dr. Sherene Sires in his office and her oxygen level at that time was improved at 95% on room air.     Current Outpatient Prescriptions  Medication Sig Dispense Refill  . furosemide (LASIX) 40 MG tablet TAKE ONE TABLET BY MOUTH ONCE DAILY BEFORE BREAKFAST.  30 tablet  2  . levothyroxine (SYNTHROID, LEVOTHROID) 100 MCG tablet Take 100 mcg by mouth daily before breakfast.       . metoprolol (LOPRESSOR) 50 MG tablet TAKE ONE-HALF TABLET BY MOUTH ONCE DAILY  15 tablet  0  . potassium chloride (KLOR-CON M10) 10 MEQ tablet 1 tablet twce a day      . [DISCONTINUED] potassium chloride (K-DUR) 10 MEQ tablet TAKE ONE TABLET BY MOUTH TWICE DAILY  60 tablet  5   No current facility-administered medications for this visit.    Allergies  Allergen Reactions  . Cephalexin     Prefers not to have ? Psoriasis skin condition    Patient Active Problem List   Diagnosis Date Noted  . OSA (obstructive sleep apnea) 09/10/2012  . Chronic respiratory failure 07/06/2012  . Expected blood  loss anemia 06/01/2012  . Obese 06/01/2012  . 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 Hyperglycemia 12/23/2011  . Orbital floor (blow-out) closed fracture 08/17/2011  . Hypertension   . Cervix abnormality   . 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 Turner syndrome 10/16/2010  . Hypothyroid 10/16/2010  . Aortic insufficiency 10/16/2010  . Osteoarthritis 10/16/2010    History  Smoking status  . Never Smoker   Smokeless tobacco  . Never Used    History  Alcohol Use No    Family History  Problem Relation Age of Onset  . Hypertension Mother   . Cancer Mother     SKIN CANCER  . Breast cancer Cousin     MATERNAL COUSIN  . Diabetes Maternal Uncle     Review of Systems: The patient denies any heat or cold intolerance.  No weight gain or weight loss.  The patient denies headaches or blurry vision.  There is no cough or sputum production.  The patient denies dizziness.  There is no hematuria or hematochezia.  The patient denies any muscle aches or arthritis.  The patient denies any rash.  The patient denies frequent falling or instability.  There is no history of depression or anxiety.  All other systems were reviewed and are negative.   Physical Exam: Filed Vitals:   03/10/13 1407  BP: 132/82  Pulse: 69   the general appearance reveals a well-developed well-nourished woman who has  the physical characteristics of Turner's syndrome.The head and neck exam reveals pupils equal and reactive.  Extraocular movements are full.  There is no scleral icterus.  The mouth and pharynx are normal.  The neck is supple.  The carotids reveal no bruits.  The jugular venous pressure is normal.  The  thyroid is not enlarged.  There is no lymphadenopathy.  The chest is clear to percussion and auscultation.  There are no rales or rhonchi.  Expansion of the chest is symmetrical.  The precordium is quiet.  The first heart sound is normal.  The second heart sound is physiologically split.   There is very soft decrescendo diastolic murmur along the sternal edge.  There is no abnormal lift or heave.  The abdomen is soft and nontender.  The bowel sounds are normal.  The liver and spleen are not enlarged.  There are no abdominal masses.  There are no abdominal bruits.  Extremities reveal good pedal pulses.  There is no phlebitis and there is chronic edema of the right lower extremities more so than the left.  There is no cyanosis or clubbing.  Strength is normal and symmetrical in all extremities.  The incision from the right total knee replacement appears to be healing well.  There is no lateralizing weakness.  There are no sensory deficits.  The skin is warm and dry.  There is no rash.  EKG shows normal sinus rhythm with sinus arrhythmia and nonspecific ST and T-wave abnormalities and is unchanged since 03/30/12.    Assessment / Plan: The patient is to continue same medication.  Continue Lasix 40 mg daily.   She had a chest x-ray on 08/15/12 showing cardiomegaly but no CHF. Recheck here in 6 months for followup office visit and CBC and basal metabolic panel.

## 2013-03-10 NOTE — Assessment & Plan Note (Signed)
The patient has done for her well since her right total knee replacement and her left total hip surgery, both done by Dr. Charlann Boxerlin.

## 2013-03-10 NOTE — Assessment & Plan Note (Signed)
Her overall blood pressure control has improved since going on CPAP.  She states that she has less exertional dyspnea as well.

## 2013-03-25 ENCOUNTER — Other Ambulatory Visit: Payer: Self-pay | Admitting: Cardiology

## 2013-04-02 ENCOUNTER — Other Ambulatory Visit: Payer: Self-pay | Admitting: Cardiology

## 2013-04-13 ENCOUNTER — Telehealth: Payer: Self-pay | Admitting: Cardiology

## 2013-04-13 NOTE — Telephone Encounter (Signed)
Spoke with mother regarding new PCP with Medicaid. She will call number on card to see if it can be changed

## 2013-04-13 NOTE — Telephone Encounter (Signed)
New problem    Pt's mom need to speak to you concerning family practice physician's in [place]. Please call

## 2013-04-14 ENCOUNTER — Ambulatory Visit: Payer: Medicaid Other | Admitting: Pulmonary Disease

## 2013-04-18 ENCOUNTER — Ambulatory Visit (INDEPENDENT_AMBULATORY_CARE_PROVIDER_SITE_OTHER): Payer: Medicaid Other | Admitting: Pulmonary Disease

## 2013-04-18 ENCOUNTER — Encounter: Payer: Self-pay | Admitting: Pulmonary Disease

## 2013-04-18 VITALS — BP 110/68 | HR 76

## 2013-04-18 DIAGNOSIS — G4733 Obstructive sleep apnea (adult) (pediatric): Secondary | ICD-10-CM

## 2013-04-18 NOTE — Progress Notes (Signed)
   Subjective:    Patient ID: [name], female    DOB: Jun 20, 1955, 58 y.o.   MRN: [id number]  HPI Patient comes in today for followup of her obstructive sleep apnea. She is wearing CPAP compliantly by her download, and has had a significant improvement in her AHI. She is still having some breakthrough at 17 events per hour, and this is explained by her excessive mask leak. They have worked with her home care company with improvement, but continues to be an issue. Her family member has seen significant improvement in her sleep and daytime alertness.   Review of Systems  Constitutional: Negative for fever and unexpected weight change.  HENT: Negative for congestion, dental problem, ear pain, nosebleeds, postnasal drip, rhinorrhea, sinus pressure, sneezing, sore throat and trouble swallowing.   Eyes: Negative for redness and itching.  Respiratory: Negative for cough, chest tightness, shortness of breath and wheezing.   Cardiovascular: Negative for palpitations and leg swelling.  Gastrointestinal: Negative for nausea and vomiting.  Genitourinary: Negative for dysuria.  Musculoskeletal: Negative for joint swelling.  Skin: Negative for rash.  Neurological: Negative for headaches.  Hematological: Does not bruise/bleed easily.  Psychiatric/Behavioral: Negative for dysphoric mood. The patient is not nervous/anxious.        Objective:   Physical Exam Overweight female in no acute distress Nose without purulence or discharge noted No skin breakdown or pressure necrosis from the CPAP mask Neck without lymphadenopathy or thyromegaly Lower extremities with mild edema, no cyanosis Alert, does not appear to be overly sleepy, moves all 4 extremities.       Assessment & Plan:

## 2013-04-18 NOTE — Patient Instructions (Signed)
Continue with cpap everynight. Will refer to sleep center for a mask fitting. Keep working on weight loss.  You are doing great.  followup with me again in one year, but call if you are having issues with your cpap.

## 2013-04-18 NOTE — Assessment & Plan Note (Signed)
The patient is doing much better on the automatic setting, with greatly improved control of her sleep apnea. She is still having some mask leak, and that is keeping her AHI in the mild sleep apnea arrange. I would like to send her to the sleep Center for a formal mask fitting, and hopefully things will continue to improve. I have also encouraged her to work aggressively on weight loss.

## 2013-04-19 ENCOUNTER — Other Ambulatory Visit: Payer: Self-pay

## 2013-04-19 MED ORDER — POTASSIUM CHLORIDE CRYS ER 10 MEQ PO TBCR: EXTENDED_RELEASE_TABLET | ORAL | Status: DC

## 2013-05-02 ENCOUNTER — Ambulatory Visit (HOSPITAL_BASED_OUTPATIENT_CLINIC_OR_DEPARTMENT_OTHER): Payer: Medicaid Other | Attending: Pulmonary Disease | Admitting: Radiology

## 2013-05-02 DIAGNOSIS — Z9989 Dependence on other enabling machines and devices: Principal | ICD-10-CM

## 2013-05-02 DIAGNOSIS — G4733 Obstructive sleep apnea (adult) (pediatric): Secondary | ICD-10-CM

## 2013-05-23 ENCOUNTER — Telehealth: Payer: Self-pay | Admitting: Cardiology

## 2013-05-23 ENCOUNTER — Telehealth: Payer: Self-pay | Admitting: *Deleted

## 2013-05-23 MED ORDER — LEVOTHYROXINE SODIUM 100 MCG PO TABS: 100.0000 ug | ORAL_TABLET | Freq: Every day | ORAL | Status: DC

## 2013-05-23 NOTE — Telephone Encounter (Signed)
Patient left vm on refill line requesting levothyroxine refill, but I am not sure if Dr Patty SermonsBrackbill still refills this for the patient. Please advise. Thanks, MI

## 2013-05-23 NOTE — Telephone Encounter (Signed)
Patient just wanted to see if  Dr. Patty SermonsBrackbill could fill her thyroid medication until she finds PCP. Advised this was filled today but to continue looking for PCP

## 2013-05-23 NOTE — Telephone Encounter (Signed)
New Message:  Pt's mom is calling to speak to [place]. She states it's in reference to one of the pt's medications.

## 2013-08-16 ENCOUNTER — Other Ambulatory Visit: Payer: Self-pay | Admitting: Cardiology

## 2013-08-29 ENCOUNTER — Other Ambulatory Visit: Payer: Self-pay | Admitting: Cardiology

## 2013-09-14 ENCOUNTER — Other Ambulatory Visit: Payer: Self-pay | Admitting: Cardiology

## 2013-09-21 ENCOUNTER — Ambulatory Visit (INDEPENDENT_AMBULATORY_CARE_PROVIDER_SITE_OTHER): Payer: Medicaid Other | Admitting: Cardiology

## 2013-09-21 ENCOUNTER — Encounter: Payer: Self-pay | Admitting: Cardiology

## 2013-09-21 ENCOUNTER — Ambulatory Visit (INDEPENDENT_AMBULATORY_CARE_PROVIDER_SITE_OTHER): Payer: Medicaid Other | Admitting: *Deleted

## 2013-09-21 VITALS — BP 122/70 | HR 72 | Ht <= 58 in | Wt 153.0 lb

## 2013-09-21 DIAGNOSIS — I10 Essential (primary) hypertension: Secondary | ICD-10-CM

## 2013-09-21 DIAGNOSIS — Q969 Turner's syndrome, unspecified: Secondary | ICD-10-CM

## 2013-09-21 DIAGNOSIS — E039 Hypothyroidism, unspecified: Secondary | ICD-10-CM

## 2013-09-21 DIAGNOSIS — I119 Hypertensive heart disease without heart failure: Secondary | ICD-10-CM

## 2013-09-21 DIAGNOSIS — I359 Nonrheumatic aortic valve disorder, unspecified: Secondary | ICD-10-CM

## 2013-09-21 DIAGNOSIS — R0609 Other forms of dyspnea: Secondary | ICD-10-CM

## 2013-09-21 DIAGNOSIS — R0989 Other specified symptoms and signs involving the circulatory and respiratory systems: Secondary | ICD-10-CM

## 2013-09-21 DIAGNOSIS — I351 Nonrheumatic aortic (valve) insufficiency: Secondary | ICD-10-CM

## 2013-09-21 LAB — CBC WITH DIFFERENTIAL/PLATELET
BASOS PCT: 0.8 % (ref 0.0–3.0)
Basophils Absolute: 0.1 10*3/uL (ref 0.0–0.1)
EOS PCT: 2.8 % (ref 0.0–5.0)
Eosinophils Absolute: 0.2 10*3/uL (ref 0.0–0.7)
HEMATOCRIT: 40.3 % (ref 36.0–46.0)
Hemoglobin: 13.8 g/dL (ref 12.0–15.0)
LYMPHS ABS: 1.4 10*3/uL (ref 0.7–4.0)
Lymphocytes Relative: 18.6 % (ref 12.0–46.0)
MCHC: 34.3 g/dL (ref 30.0–36.0)
MCV: 95.1 fl (ref 78.0–100.0)
MONO ABS: 0.8 10*3/uL (ref 0.1–1.0)
Monocytes Relative: 11.1 % (ref 3.0–12.0)
Neutro Abs: 5 10*3/uL (ref 1.4–7.7)
Neutrophils Relative %: 66.7 % (ref 43.0–77.0)
PLATELETS: 234 10*3/uL (ref 150.0–400.0)
RBC: 4.24 Mil/uL (ref 3.87–5.11)
RDW: 13.6 % (ref 11.5–15.5)
WBC: 7.5 10*3/uL (ref 4.0–10.5)

## 2013-09-21 LAB — BASIC METABOLIC PANEL
BUN: 16 mg/dL (ref 6–23)
CHLORIDE: 103 meq/L (ref 96–112)
CO2: 30 mEq/L (ref 19–32)
Calcium: 9.3 mg/dL (ref 8.4–10.5)
Creatinine, Ser: 0.7 mg/dL (ref 0.4–1.2)
GFR: 99.22 mL/min (ref >60.00)
Glucose, Bld: 86 mg/dL (ref 70–99)
POTASSIUM: 3.7 meq/L (ref 3.5–5.1)
Sodium: 140 mEq/L (ref 135–145)

## 2013-09-21 NOTE — Progress Notes (Signed)
[name] Date of Birth:  Jun 11, 1955 [facility] [address] [place], [place]  [address] [phone]        Fax   [phone]   History of Present Illness: This pleasant 58 year old woman is seen for a scheduled followup office visit. She has a past history of Turner's syndrome. She has a history of essential hypertension and a history of mild aortic insufficiency. She had a chest x-ray 10/07/11 showing borderline enlarged heart size. She had a two-dimensional echocardiogram 10/24/11 showing normal left ventricular systolic function with ejection fraction 55-60%. There was grade 2 diastolic dysfunction. There was mild aortic stenosis and there was moderate aortic insufficiency and there was biatrial enlargement. The patient has significant osteoarthritis. Since we last saw her she has had successful right total knee replacement by Dr. Charlann Boxer.  She had also had a previous left hip replacement. She has a history of sleep apnea and now has a CPAP machine which is working well for her.  Since last visit she's had no new cardiac symptoms.   Current Outpatient Prescriptions  Medication Sig Dispense Refill  . Cholecalciferol (VITAMIN D3) 2000 UNITS capsule Take 2,000 Units by mouth daily.      . furosemide (LASIX) 40 MG tablet TAKE ONE TABLET BY MOUTH ONCE DAILY BEFORE BREAKFAST.  30 tablet  6  . KLOR-CON M10 10 MEQ tablet TAKE ONE TABLET BY MOUTH TWICE DAILY  60 tablet  0  . levothyroxine (SYNTHROID, LEVOTHROID) 100 MCG tablet Take 1 tablet (100 mcg total) by mouth daily before breakfast.  30 tablet  6  . metoprolol (LOPRESSOR) 50 MG tablet Take 1/2 tablet daily      . [DISCONTINUED] potassium chloride (K-DUR) 10 MEQ tablet TAKE ONE TABLET BY MOUTH TWICE DAILY  60 tablet  5   No current facility-administered medications for this visit.    Allergies  Allergen Reactions  . Cephalexin     Prefers not to have ? Psoriasis skin condition    Patient Active Problem  List   Diagnosis Date Noted  . OSA (obstructive sleep apnea) 09/10/2012  . Chronic respiratory failure 07/06/2012  . Expected blood loss anemia 06/01/2012  . Obese 06/01/2012  . 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 Hyperglycemia 12/23/2011  . Orbital floor (blow-out) closed fracture 08/17/2011  . Hypertension   . Cervix abnormality   . 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 Turner syndrome 10/16/2010  . Hypothyroid 10/16/2010  . Aortic insufficiency 10/16/2010  . Osteoarthritis 10/16/2010    History  Smoking status  . Never Smoker   Smokeless tobacco  . Never Used    History  Alcohol Use No    Family History  Problem Relation Age of Onset  . Hypertension Mother   . Cancer Mother     SKIN CANCER  . Breast cancer Cousin     MATERNAL COUSIN  . Diabetes Maternal Uncle     Review of Systems: Constitutional: no fever chills diaphoresis or fatigue or change in weight.  Head and neck: no hearing loss, no epistaxis, no photophobia or visual disturbance. Respiratory: No cough, shortness of breath or wheezing. Cardiovascular: No chest pain peripheral edema, palpitations. Gastrointestinal: No abdominal distention, no abdominal pain, no change in bowel habits hematochezia or melena. Genitourinary: No dysuria, no frequency, no urgency, no nocturia. Musculoskeletal:No arthralgias, no back pain, no gait disturbance or myalgias. Neurological: No dizziness, no headaches, no numbness, no seizures, no syncope, no weakness, no tremors.  Hematologic: No lymphadenopathy, no easy bruising. Psychiatric: No confusion, no hallucinations, no sleep disturbance.    Physical Exam: Filed Vitals:   09/21/13 1508  BP: 122/70  Pulse: 72  The patient appears to be in no distress.  She has the outward physical characteristics of Turner's syndrome  Head and neck exam reveals that the pupils are equal and reactive.  The extraocular movements are full.  There is no scleral  icterus.  Mouth and pharynx are benign.  No lymphadenopathy.  No carotid bruits.  The jugular venous pressure is normal.  Thyroid is not enlarged or tender.  Chest is clear to percussion and auscultation.  No rales or rhonchi.  Expansion of the chest is symmetrical.  She has a shield chest  Heart reveals no abnormal lift or heave.  First and second heart sounds are normal.  There is no  gallop rub or click.  There is a grade 2/6 decrescendo aortic insufficiency murmur along the left sternal edge.  The abdomen is soft and nontender.  Bowel sounds are normoactive.  There is no hepatosplenomegaly or mass.  There are no abdominal bruits.  Extremities reveal mild edema.  Pedal pulses are good.  There is no cyanosis or clubbing.  Neurologic exam is normal strength and no lateralizing weakness.  No sensory deficits.  Integument reveals no rash    Assessment / Plan: 1.  Turner syndrome 2. essential hypertension without heart failure 3. mild aortic insufficiency 4. Hypothyroidism followed by Dr. Mardelle Matte 5. Osteoarthritis followed by Dr. Charlann Boxer 6.  Obstructive sleep apnea, uses CPAP machine  Plan: Checking a CBC and a basal metabolic panel today.  Continue same medication.  Recheck in 6 months for office visit and EKG

## 2013-09-21 NOTE — Assessment & Plan Note (Signed)
The patient has known aortic insufficiency.  She has not had any symptoms of congestive heart failure.  She has chronic pedal edema which is no worse.

## 2013-09-21 NOTE — Assessment & Plan Note (Signed)
She has not been having any headaches or dizziness.  No syncope

## 2013-09-21 NOTE — Patient Instructions (Signed)
Will obtain labs today and call you with the results (CBC/BMET)  Your physician recommends that you continue on your current medications as directed. Please refer to the Current Medication list given to you today.  Your physician wants you to follow-up in: 6 MONTH OV/EKG  You will receive a reminder letter in the mail two months in advance. If you don't receive a letter, please call our office to schedule the follow-up appointment.

## 2013-09-21 NOTE — Assessment & Plan Note (Signed)
The patient is clinically euthyroid on her current replacement therapy.  Her thyroid functions are monitored by her PCP

## 2013-09-22 NOTE — Progress Notes (Signed)
Quick Note:  Please report to patient. The recent labs are stable. Continue same medication and careful diet. ______ 

## 2013-09-28 ENCOUNTER — Other Ambulatory Visit: Payer: Self-pay | Admitting: Cardiology

## 2013-10-21 ENCOUNTER — Other Ambulatory Visit: Payer: Self-pay | Admitting: Cardiology

## 2013-11-07 ENCOUNTER — Telehealth: Payer: Self-pay | Admitting: Pulmonary Disease

## 2013-11-07 NOTE — Telephone Encounter (Signed)
Spoke with mother-she is aware that we will [facility] to assist in finding out if patient can change DME's based on [facility]. Will call with updates as able.

## 2013-11-08 NOTE — Telephone Encounter (Signed)
Spoke with pt mother Chyrl CivatteJoann who says her daughter cpap air wasn't blowing out, [facility] yesterday, but told her they would not be coming to the house anymore unless patient is bedridden. I asked was the problem resolved mother said yes. Just wanted to know what her options were with other DME companies.  Spoke with Henderson NewcomerMelissa Stenson [facility] liasion in ref to this, she says that is true unless patients are bedridden they recommend patients come to them, that way they have all the equipment there to resolve any issue, She will call patients mother today to see if there is anything else that can be done. [name] will give update.

## 2013-11-09 NOTE — Telephone Encounter (Signed)
Spoke with [name] @ [facility] in re; to patient, she has spoken to mother Chyrl CivatteJoann who says problem has been resolved/ Per [name], pt called [facility] and wanted [facility] to come to pt home, per [place] RT tech offered appt to come and pt mother had refused says she wasn't going to be home had another appt, wanted them to come now.  Terance HartDave Rosenberg happened to be in office at the time and went to pt home to troubleshoot, but did make pt mother aware this is not standard protocol, pt could have come to office but refused. Per [name] pt says problem has been resolved for daughter cpap. Kandice Hams.Patria Warzecha

## 2013-12-15 ENCOUNTER — Other Ambulatory Visit: Payer: Self-pay | Admitting: *Deleted

## 2013-12-15 MED ORDER — POTASSIUM CHLORIDE CRYS ER 10 MEQ PO TBCR: 10.0000 meq | EXTENDED_RELEASE_TABLET | Freq: Two times a day (BID) | ORAL | Status: DC

## 2013-12-25 ENCOUNTER — Telehealth: Payer: Self-pay | Admitting: Cardiology

## 2013-12-25 NOTE — Telephone Encounter (Signed)
Left message to call back  

## 2013-12-25 NOTE — Telephone Encounter (Signed)
New Message  Pt mother requests to speak with Rn. Please call back and discuss.

## 2013-12-26 NOTE — Telephone Encounter (Signed)
Spoke with mother, actually calling about a general question Nothing discussed pertaining to patient

## 2013-12-26 NOTE — Telephone Encounter (Signed)
Follow up ° ° ° ° °Returning [name]'s call °

## 2014-03-15 ENCOUNTER — Telehealth: Payer: Self-pay | Admitting: Pulmonary Disease

## 2014-03-15 DIAGNOSIS — G4733 Obstructive sleep apnea (adult) (pediatric): Secondary | ICD-10-CM

## 2014-03-15 NOTE — Telephone Encounter (Signed)
Spoke with the pt's Mother, [name] She states that pt's CPAP machine is not "blowing air anymore" She states that she called [facility] and they advised there was nothing they could do to help  I advised I will call and see what we can do  LMTCB for [name] and went ahead and [facility] to be looked at

## 2014-03-16 ENCOUNTER — Telehealth: Payer: Self-pay | Admitting: Pulmonary Disease

## 2014-03-16 NOTE — Telephone Encounter (Signed)
Called [name]. She was calling to let us know the contacted the patient to bring machine in for them to look at. Nothing further needed

## 2014-03-16 NOTE — Telephone Encounter (Signed)
[phone], [name]

## 2014-03-16 NOTE — Telephone Encounter (Signed)
Called [facility] with [name] She did get Leslie's message and will pull order from epic. They will give pt call today.  Called [name] and LMTCB x1

## 2014-03-16 NOTE — Telephone Encounter (Signed)
Spoke with Chyrl CivatteJoann, made her aware that [facility] them today.  Nothing further needed.

## 2014-03-17 ENCOUNTER — Telehealth: Payer: Self-pay | Admitting: Critical Care Medicine

## 2014-03-17 NOTE — Telephone Encounter (Signed)
Received phone call from patient's mother.  Mother is upset that [facility] never returned call about malfunctioning CPAP machine.  Review of chart shows at least [facility].  UI reviewed these communications with the patient's mother.  She wanted to make sure that Dr. Shelle Ironlance is aware of the issue.  I assured her I would document our communication and forward this on to Dr. Shelle Ironlance.  In the meantime I recommended that if the patient was in severe resp distress options were to call EMS and/or transport to the local ER.

## 2014-03-19 ENCOUNTER — Telehealth: Payer: Self-pay | Admitting: *Deleted

## 2014-03-19 NOTE — Telephone Encounter (Signed)
Called spoke with pt Mother. She refuses to take the equipment over bc she is "sick". Pt has medicaid and wants to know if there is another DME pt can use or are they stuck with [facility]? Please advise PCC's thanks

## 2014-03-19 NOTE — Telephone Encounter (Signed)
PCC's is there anything else can be done? Pt mother wants something done today. I'm not sure what else can be done since pt mother is refusing to take equipment in. thanks

## 2014-03-19 NOTE — Telephone Encounter (Signed)
i don't know of anything else to tell her she needs to get the machine over there for them to ck it out Tobe SosSally E Ottinger

## 2014-03-19 NOTE — Telephone Encounter (Signed)
Pt mother called in and was very upset with [facility]. She called and spoke with doc on call yesterday. Pt CPAP/O2 is not working. She reports pt is not able [facility] are [facility]. She reports pt is handicap and does not drive. Mother is not able to take this in either. She reports the doc on call was suppose to let [facility] know the situation today. I advised her he would not be in until Wednesday. She then made the comment if [facility] can't do what he is suppose to do then she needs to find her daughter another doctor that can. I advised her we will [facility] and see what can be done.  The mother wants something done TODAY.  I called [name] and LMTCB x1

## 2014-03-19 NOTE — Telephone Encounter (Signed)
[name] called back and said that she spoke with the RT, Cyndra Numbers(Bernie) he is standing firm, patient needs to bring machine and respirator into the store and needs to be re-educated on how to use the machine.  Cyndra NumbersBernie also recommends that pt's caregiver come with patient so that she can also be educated on proper use. Cyndra NumbersBernie says that patient is being extremely rude with him, he tries to talk to her and she will hang up the phone on him.  Also, patient told Cyndra NumbersBernie that once she speaks to [facility], that [facility] will force him Cyndra Numbers(Bernie) to come out to her house.    To KC:  Please advise as to what you would like to do.

## 2014-03-19 NOTE — Telephone Encounter (Signed)
[name] called back. I made her aware of below. She reports Bernie the Arts development officerT manager called them today and they refused to schedule an appt to bring machine to be looked at.  She is going to speak with Cyndra NumbersBernie again to see what can be done since mother is refusing to take machine into them to be checked out. Will await call back

## 2014-03-20 ENCOUNTER — Ambulatory Visit (INDEPENDENT_AMBULATORY_CARE_PROVIDER_SITE_OTHER): Payer: Medicaid Other | Admitting: Cardiology

## 2014-03-20 ENCOUNTER — Encounter: Payer: Self-pay | Admitting: Cardiology

## 2014-03-20 VITALS — BP 118/80 | HR 79 | Ht <= 58 in | Wt 145.8 lb

## 2014-03-20 DIAGNOSIS — R059 Cough, unspecified: Secondary | ICD-10-CM

## 2014-03-20 DIAGNOSIS — I10 Essential (primary) hypertension: Secondary | ICD-10-CM

## 2014-03-20 DIAGNOSIS — Q969 Turner's syndrome, unspecified: Secondary | ICD-10-CM

## 2014-03-20 DIAGNOSIS — R05 Cough: Secondary | ICD-10-CM

## 2014-03-20 DIAGNOSIS — I351 Nonrheumatic aortic (valve) insufficiency: Secondary | ICD-10-CM

## 2014-03-20 NOTE — Telephone Encounter (Signed)
i have spoken to the mother and i am working on several solutions for this problem [name]

## 2014-03-20 NOTE — Progress Notes (Signed)
Cardiology Office Note   Date:  03/20/2014   ID:  Donna DeeLori M Spinner, DOB 03/13/1955, MRN [id number]  PCP:  Willow OraANDY,CAMILLE L, MD  Cardiologist:   Cassell Clementhomas Antione Obar, MD   No chief complaint on file.     History of Present Illness: [name] is a 59 y.o. female who presents for a month follow-up office visit  This pleasant 59 year old woman is seen for a scheduled followup office visit. She has a past history of Turner's syndrome. She has a history of essential hypertension and a history of mild aortic insufficiency. She had a chest x-ray 10/07/11 showing borderline enlarged heart size. She had a two-dimensional echocardiogram 10/24/11 showing normal left ventricular systolic function with ejection fraction 55-60%. There was grade 2 diastolic dysfunction. There was mild aortic stenosis and there was moderate aortic insufficiency and there was biatrial enlargement. The patient has significant osteoarthritis. Since we last saw her she has had successful right total knee replacement by Dr. Charlann Boxerlin. She had also had a previous left hip replacement. She has a history of sleep apnea and now has a CPAP machine which is working well for her. Since last visit she's had no new cardiac symptoms. She has had a bad cough.  Her PCP recently gave her Tessalon Perles and a Zithromax Z-Pak.  The cough is slightly better.  She is having difficulty hearing out of her left ear since she has had the sinus infection and cough.  Past Medical History  Diagnosis Date  . Turner's syndrome   . Aortic insufficiency   . Hypothyroidism   . SOB (shortness of breath)   . Periorbital edema   . Hypertension   . Cervix abnormality 1993    MILD ATYPICAL ADENOMATOUS HYPERPLASIA OF CERVIX  . Dermatitis 05-26-12    all over  since 9'13  . Edema of both legs     right greater than left  . Speech disorder     occ. "stutter"  . Difficult intubation     will plan on spinal anesthesia"small mouth/airway"    Past Surgical  History  Procedure Laterality Date  . Total hip arthroplasty      LEFT  . [place] echocardiography  06/20/2009    EF 55-60%  . Dilation and curettage of uterus  1993    CONE BIOPSY  . Cervical cone biopsy  1993  . Hernia repair    . Colposcopy    . Joint replacement      hip replacement  . Total knee arthroplasty Right 05/31/2012    Procedure: RIGHT TOTAL KNEE ARTHROPLASTY;  Surgeon: Shelda PalMatthew D Olin, MD;  Location: [facility];  Service: Orthopedics;  Laterality: Right;     Current Outpatient Prescriptions  Medication Sig Dispense Refill  . Cholecalciferol (VITAMIN D3) 2000 UNITS capsule Take 2,000 Units by mouth daily.    . furosemide (LASIX) 40 MG tablet TAKE ONE TABLET BY MOUTH ONCE DAILY BEFORE BREAKFAST 30 tablet 5  . guaiFENesin (MUCINEX) 600 MG 12 hr tablet Take 600 mg by mouth 2 (two) times daily.    Marland Kitchen. levothyroxine (SYNTHROID, LEVOTHROID) 100 MCG tablet Take 1 tablet (100 mcg total) by mouth daily before breakfast. 30 tablet 6  . metoprolol (LOPRESSOR) 50 MG tablet Take 1/2 tablet daily    . potassium chloride (KLOR-CON M10) 10 MEQ tablet Take 1 tablet (10 mEq total) by mouth 2 (two) times daily. 60 tablet 3  . [DISCONTINUED] potassium chloride (K-DUR) 10 MEQ tablet TAKE ONE TABLET BY MOUTH TWICE  DAILY 60 tablet 5   No current facility-administered medications for this visit.    Allergies:   Cephalexin    Social History:  The patient  reports that she has never smoked. She has never used smokeless tobacco. She reports that she does not drink alcohol or use illicit drugs.   Family History:  The patient's family history includes Breast cancer in her cousin; Cancer in her mother; Diabetes in her maternal uncle; Hypertension in her mother.    ROS:  Please see the history of present illness.   Otherwise, review of systems are positive for none.   All other systems are reviewed and negative.    PHYSICAL EXAM: VS:  BP 118/80 mmHg  Pulse 79  Ht  (1.422 m)  Wt 145 lb 12.8 oz  (66.134 kg)  BMI 32.71 kg/m2 , BMI Body mass index is 32.71 kg/(m^2). GEN: Well nourished, well developed, in no acute distress HEENT: normal Neck: no JVD, carotid bruits, or masses Cardiac: RRR; there is a 2/6 murmur of aortic insufficiency at the left sternal edge.  There is no gallop or rub.  Respiratory:  clear to auscultation bilaterally, normal work of breathing GI: soft, nontender, nondistended, + BS MS: no deformity or atrophy Skin: warm and dry, no rash.  There is lymphedema of the lower extremities but no pitting edema Neuro:  Strength and sensation are intact Psych: euthymic mood, full affect   EKG:  EKG is ordered today. The ekg ordered today demonstrates normal sinus rhythm with occasional PAC.  Since previous tracing of 03/10/13, no significant change   Recent Labs: 09/21/2013: BUN 16; Creatinine 0.7; Hemoglobin 13.8; Platelets 234.0; Potassium 3.7; Sodium 140    Lipid Panel    Component Value Date/Time   CHOL 166 03/30/2012 1339   TRIG 129.0 03/30/2012 1339   HDL 44.50 03/30/2012 1339   CHOLHDL 4 03/30/2012 1339   VLDL 25.8 03/30/2012 1339   LDLCALC 96 03/30/2012 1339      Wt Readings from Last 3 Encounters:  03/20/14 145 lb 12.8 oz (66.134 kg)  09/21/13 153 lb (69.4 kg)  03/10/13 153 lb (69.4 kg)        ASSESSMENT AND PLAN: 1. Turner syndrome 2. essential hypertension without heart failure 3. mild aortic insufficiency 4. Hypothyroidism followed by Dr. Mardelle Matte 5. Osteoarthritis followed by Dr. Charlann Boxer 6. Obstructive sleep apnea, uses CPAP machine 7.  Cough secondary to bronchitis and upper respiratory infection  Plan: No current medications.  Start Mucinex generic 600 mg twice a day as needed for chest congestion  Current medicines are reviewed at length with the patient today.  The patient does not have concerns regarding medicines.  The following changes have been made:  no change  Labs/ tests ordered today include:   Orders Placed This Encounter    Procedures  . EKG 12-Lead     Recheck in 6 months for follow-up office visit and basal metabolic panel  Signed, Cassell Clement, MD  03/20/2014 6:13 PM    [facility] [address] [place], [place], [place]  [address] Phone: [phone]; Fax: [phone]

## 2014-03-20 NOTE — Patient Instructions (Signed)
Start Mucinex 600 mg twice a day as needed   Your physician wants you to follow-up in: 6 months ov/bmet You will receive a reminder letter in the mail two months in advance. If you don't receive a letter, please call our office to schedule the follow-up appointment.

## 2014-03-21 NOTE — Telephone Encounter (Signed)
After speaking to both pt and bernie([facility] rt manager 0 [facility] has sent rt's out to the pt's home 5 times pt is not homebound but as a courtesy for both myself and dr clance they are going to call pt and send a rt out there again to educate pt and mother on how to use both 02 and cpap combined pt's mother is aware if these problems soul occur again they will need to bring there machine into the office Tobe SosSally E Ottinger

## 2014-03-21 NOTE — Telephone Encounter (Signed)
Ok guys, this needs to be taken care of TODAY.  If [facility], will send to a different company.   Thanks.

## 2014-03-24 ENCOUNTER — Other Ambulatory Visit: Payer: Self-pay | Admitting: Cardiology

## 2014-04-02 ENCOUNTER — Telehealth: Payer: Self-pay | Admitting: Cardiology

## 2014-04-02 NOTE — Telephone Encounter (Signed)
New message       What can she take for her cough?

## 2014-04-02 NOTE — Telephone Encounter (Signed)
Patient has been taking Mucinex and still coughing Advised mother to contact PCP, verbalized understanding

## 2014-04-12 ENCOUNTER — Other Ambulatory Visit: Payer: Self-pay | Admitting: Cardiology

## 2014-04-16 ENCOUNTER — Other Ambulatory Visit: Payer: Self-pay | Admitting: Cardiology

## 2014-04-18 ENCOUNTER — Ambulatory Visit: Payer: Medicaid Other | Admitting: Pulmonary Disease

## 2014-04-19 ENCOUNTER — Other Ambulatory Visit: Payer: Self-pay | Admitting: Cardiology

## 2014-05-04 ENCOUNTER — Ambulatory Visit: Payer: Medicaid Other | Admitting: Pulmonary Disease

## 2014-05-25 ENCOUNTER — Ambulatory Visit: Payer: Medicaid Other | Admitting: Pulmonary Disease

## 2014-06-06 ENCOUNTER — Ambulatory Visit (INDEPENDENT_AMBULATORY_CARE_PROVIDER_SITE_OTHER): Payer: Medicaid Other | Admitting: Pulmonary Disease

## 2014-06-06 ENCOUNTER — Encounter: Payer: Self-pay | Admitting: Pulmonary Disease

## 2014-06-06 VITALS — BP 112/64 | HR 80 | Temp 98.0°F | Ht <= 58 in | Wt 142.8 lb

## 2014-06-06 DIAGNOSIS — G4733 Obstructive sleep apnea (adult) (pediatric): Secondary | ICD-10-CM | POA: Diagnosis not present

## 2014-06-06 NOTE — Patient Instructions (Signed)
Will send an order to your homecare company to work with you on mask fit and how to change out the cushion. Keep working on weight loss followup  In one year with Dr. Craige CottaSood, but call if having issues with your cpap.

## 2014-06-06 NOTE — Progress Notes (Signed)
   Subjective:    Patient ID: [name], female    DOB: 12-26-1955, 59 y.o.   MRN: [id number]  HPI Patient comes in today for follow-up of her obstructive sleep apnea. He is wearing C Pap compliantly by her download, but is having breakthrough apnea secondary to mask leak. Her mask that she is brought in today shows a very old and worn cushion, and obviously needs to be replaced.  He is also concerned about her mask fit being inadequate.   Review of Systems  Constitutional: Negative for fever and unexpected weight change.  HENT: Negative for congestion, dental problem, ear pain, nosebleeds, postnasal drip, rhinorrhea, sinus pressure, sneezing, sore throat and trouble swallowing.   Eyes: Negative for redness and itching.  Respiratory: Negative for cough, chest tightness, shortness of breath and wheezing.   Cardiovascular: Negative for palpitations and leg swelling.  Gastrointestinal: Negative for nausea and vomiting.  Genitourinary: Negative for dysuria.  Musculoskeletal: Negative for joint swelling.  Skin: Negative for rash.  Neurological: Negative for headaches.  Hematological: Does not bruise/bleed easily.  Psychiatric/Behavioral: Negative for dysphoric mood. The patient is not nervous/anxious.        Objective:   Physical Exam Overweight female in no acute distress Nose without purulence or discharge noted No skin breakdown or pressure necrosis from the C Pap mask Neck without lymphadenopathy or thyromegaly Lower extremities with mild edema, no cyanosis Alert and oriented, moves all 4 extremities.       Assessment & Plan:

## 2014-06-06 NOTE — Assessment & Plan Note (Signed)
The patient is wearing C Pap very compliantly by her download, but she is having some breakthrough events because of significant mask leak. They have brought her mask from home, and this is quite old with breakdown of her cushion. This will obviously need to be changed, and will also get her home care company to work with her on mask fit. I've asked her to keep up with her cushion changes on a consistent basis, and also work aggressively on weight loss.

## 2014-08-11 ENCOUNTER — Other Ambulatory Visit: Payer: Self-pay | Admitting: Cardiology

## 2014-08-18 ENCOUNTER — Other Ambulatory Visit: Payer: Self-pay | Admitting: Cardiology

## 2014-08-24 ENCOUNTER — Telehealth: Payer: Self-pay | Admitting: Cardiology

## 2014-08-24 NOTE — Telephone Encounter (Signed)
Advised mother patient could try Mucinex plain if no better to contact PCP, verbalized understanding

## 2014-08-24 NOTE — Telephone Encounter (Signed)
New message    Pt mother calling in regards to pt Pt has "hacking"cough and problem with nausea Please call to discuss

## 2014-08-27 ENCOUNTER — Inpatient Hospital Stay (HOSPITAL_COMMUNITY)
Admission: EM | Admit: 2014-08-27 | Discharge: 2014-09-04 | DRG: 308 | Disposition: A | Discharge status: Home / Self Care | Payer: Medicaid Other | Attending: Internal Medicine | Admitting: Internal Medicine

## 2014-08-27 ENCOUNTER — Telehealth: Payer: Self-pay | Admitting: Cardiology

## 2014-08-27 ENCOUNTER — Emergency Department (HOSPITAL_COMMUNITY): Payer: Medicaid Other

## 2014-08-27 ENCOUNTER — Encounter (HOSPITAL_COMMUNITY): Payer: Self-pay

## 2014-08-27 DIAGNOSIS — I5031 Acute diastolic (congestive) heart failure: Secondary | ICD-10-CM | POA: Diagnosis not present

## 2014-08-27 DIAGNOSIS — T502X5A Adverse effect of carbonic-anhydrase inhibitors, benzothiadiazides and other diuretics, initial encounter: Secondary | ICD-10-CM | POA: Diagnosis not present

## 2014-08-27 DIAGNOSIS — E876 Hypokalemia: Secondary | ICD-10-CM | POA: Diagnosis not present

## 2014-08-27 DIAGNOSIS — Z96651 Presence of right artificial knee joint: Secondary | ICD-10-CM | POA: Diagnosis present

## 2014-08-27 DIAGNOSIS — I110 Hypertensive heart disease with heart failure: Secondary | ICD-10-CM | POA: Diagnosis present

## 2014-08-27 DIAGNOSIS — E039 Hypothyroidism, unspecified: Secondary | ICD-10-CM | POA: Diagnosis present

## 2014-08-27 DIAGNOSIS — Z96649 Presence of unspecified artificial hip joint: Secondary | ICD-10-CM | POA: Diagnosis present

## 2014-08-27 DIAGNOSIS — J9621 Acute and chronic respiratory failure with hypoxia: Secondary | ICD-10-CM | POA: Diagnosis present

## 2014-08-27 DIAGNOSIS — R590 Localized enlarged lymph nodes: Secondary | ICD-10-CM

## 2014-08-27 DIAGNOSIS — Z6833 Body mass index (BMI) 33.0-33.9, adult: Secondary | ICD-10-CM

## 2014-08-27 DIAGNOSIS — I5021 Acute systolic (congestive) heart failure: Secondary | ICD-10-CM | POA: Diagnosis not present

## 2014-08-27 DIAGNOSIS — I10 Essential (primary) hypertension: Secondary | ICD-10-CM | POA: Diagnosis present

## 2014-08-27 DIAGNOSIS — I4891 Unspecified atrial fibrillation: Principal | ICD-10-CM | POA: Diagnosis present

## 2014-08-27 DIAGNOSIS — I350 Nonrheumatic aortic (valve) stenosis: Secondary | ICD-10-CM | POA: Diagnosis not present

## 2014-08-27 DIAGNOSIS — Z8249 Family history of ischemic heart disease and other diseases of the circulatory system: Secondary | ICD-10-CM

## 2014-08-27 DIAGNOSIS — R002 Palpitations: Secondary | ICD-10-CM | POA: Diagnosis not present

## 2014-08-27 DIAGNOSIS — I471 Supraventricular tachycardia, unspecified: Secondary | ICD-10-CM

## 2014-08-27 DIAGNOSIS — D72829 Elevated white blood cell count, unspecified: Secondary | ICD-10-CM

## 2014-08-27 DIAGNOSIS — J9622 Acute and chronic respiratory failure with hypercapnia: Secondary | ICD-10-CM

## 2014-08-27 DIAGNOSIS — G4736 Sleep related hypoventilation in conditions classified elsewhere: Secondary | ICD-10-CM | POA: Diagnosis present

## 2014-08-27 DIAGNOSIS — Z881 Allergy status to other antibiotic agents status: Secondary | ICD-10-CM | POA: Diagnosis not present

## 2014-08-27 DIAGNOSIS — E873 Alkalosis: Secondary | ICD-10-CM | POA: Diagnosis present

## 2014-08-27 DIAGNOSIS — I429 Cardiomyopathy, unspecified: Secondary | ICD-10-CM | POA: Diagnosis present

## 2014-08-27 DIAGNOSIS — J9601 Acute respiratory failure with hypoxia: Secondary | ICD-10-CM | POA: Insufficient documentation

## 2014-08-27 DIAGNOSIS — I5041 Acute combined systolic (congestive) and diastolic (congestive) heart failure: Secondary | ICD-10-CM | POA: Diagnosis present

## 2014-08-27 DIAGNOSIS — G4733 Obstructive sleep apnea (adult) (pediatric): Secondary | ICD-10-CM | POA: Diagnosis present

## 2014-08-27 DIAGNOSIS — R911 Solitary pulmonary nodule: Secondary | ICD-10-CM

## 2014-08-27 DIAGNOSIS — R011 Cardiac murmur, unspecified: Secondary | ICD-10-CM | POA: Diagnosis present

## 2014-08-27 DIAGNOSIS — I119 Hypertensive heart disease without heart failure: Secondary | ICD-10-CM | POA: Diagnosis present

## 2014-08-27 DIAGNOSIS — R7989 Other specified abnormal findings of blood chemistry: Secondary | ICD-10-CM | POA: Diagnosis not present

## 2014-08-27 DIAGNOSIS — I352 Nonrheumatic aortic (valve) stenosis with insufficiency: Secondary | ICD-10-CM | POA: Diagnosis present

## 2014-08-27 DIAGNOSIS — R0600 Dyspnea, unspecified: Secondary | ICD-10-CM | POA: Diagnosis not present

## 2014-08-27 DIAGNOSIS — E669 Obesity, unspecified: Secondary | ICD-10-CM | POA: Diagnosis present

## 2014-08-27 DIAGNOSIS — I351 Nonrheumatic aortic (valve) insufficiency: Secondary | ICD-10-CM | POA: Diagnosis present

## 2014-08-27 DIAGNOSIS — R0902 Hypoxemia: Secondary | ICD-10-CM

## 2014-08-27 DIAGNOSIS — Q969 Turner's syndrome, unspecified: Secondary | ICD-10-CM

## 2014-08-27 DIAGNOSIS — I4819 Other persistent atrial fibrillation: Secondary | ICD-10-CM | POA: Diagnosis present

## 2014-08-27 DIAGNOSIS — J690 Pneumonitis due to inhalation of food and vomit: Secondary | ICD-10-CM | POA: Diagnosis present

## 2014-08-27 HISTORY — DX: Supraventricular tachycardia, unspecified: I47.10

## 2014-08-27 HISTORY — DX: Supraventricular tachycardia: I47.1

## 2014-08-27 LAB — BASIC METABOLIC PANEL
ANION GAP: 11 (ref 5–15)
BUN: 32 mg/dL — ABNORMAL HIGH (ref 6–20)
CALCIUM: 8.5 mg/dL — AB (ref 8.9–10.3)
CO2: 26 mmol/L (ref 22–32)
Chloride: 104 mmol/L (ref 101–111)
Creatinine, Ser: 0.99 mg/dL (ref 0.44–1.00)
GLUCOSE: 90 mg/dL (ref 65–99)
Potassium: 3.4 mmol/L — ABNORMAL LOW (ref 3.5–5.1)
Sodium: 141 mmol/L (ref 135–145)

## 2014-08-27 LAB — PROTIME-INR
INR: 1.18 (ref 0.00–1.49)
PROTHROMBIN TIME: 15.2 s (ref 11.6–15.2)

## 2014-08-27 LAB — CBC
HCT: 40.5 % (ref 36.0–46.0)
HEMOGLOBIN: 13.6 g/dL (ref 12.0–15.0)
MCH: 32.9 pg (ref 26.0–34.0)
MCHC: 33.6 g/dL (ref 30.0–36.0)
MCV: 98.1 fL (ref 78.0–100.0)
Platelets: 282 10*3/uL (ref 150–400)
RBC: 4.13 MIL/uL (ref 3.87–5.11)
RDW: 14.9 % (ref 11.5–15.5)
WBC: 10.4 10*3/uL (ref 4.0–10.5)

## 2014-08-27 LAB — APTT: aPTT: 25 seconds (ref 24–37)

## 2014-08-27 LAB — TROPONIN I: TROPONIN I: 0.05 ng/mL — AB (ref <0.031)

## 2014-08-27 MED ORDER — DILTIAZEM LOAD VIA INFUSION
10.0000 mg | Freq: Once | INTRAVENOUS | Status: AC
  Administered 2014-08-27: 10 mg via INTRAVENOUS
  Filled 2014-08-27: qty 10

## 2014-08-27 MED ORDER — LEVOTHYROXINE SODIUM 100 MCG PO TABS
100.0000 ug | ORAL_TABLET | Freq: Every day | ORAL | Status: DC
  Administered 2014-08-28 – 2014-08-30 (×3): 100 ug via ORAL
  Filled 2014-08-27 (×3): qty 1

## 2014-08-27 MED ORDER — ASPIRIN EC 81 MG PO TBEC
81.0000 mg | DELAYED_RELEASE_TABLET | Freq: Every day | ORAL | Status: DC
  Administered 2014-08-27 – 2014-08-30 (×4): 81 mg via ORAL
  Filled 2014-08-27 (×4): qty 1

## 2014-08-27 MED ORDER — METOPROLOL TARTRATE 25 MG PO TABS: 25.0000 mg | ORAL_TABLET | Freq: Every day | ORAL | Status: DC

## 2014-08-27 MED ORDER — POTASSIUM CHLORIDE CRYS ER 10 MEQ PO TBCR
10.0000 meq | EXTENDED_RELEASE_TABLET | Freq: Two times a day (BID) | ORAL | Status: DC
Start: 2014-08-27 — End: 2014-08-28
  Administered 2014-08-27 – 2014-08-28 (×2): 10 meq via ORAL
  Filled 2014-08-27 (×2): qty 1

## 2014-08-27 MED ORDER — HEPARIN SODIUM (PORCINE) 5000 UNIT/ML IJ SOLN
5000.0000 [IU] | Freq: Three times a day (TID) | INTRAMUSCULAR | Status: DC
  Administered 2014-08-27 – 2014-08-28 (×3): 5000 [IU] via SUBCUTANEOUS
  Filled 2014-08-27 (×3): qty 1

## 2014-08-27 MED ORDER — METOPROLOL TARTRATE 25 MG PO TABS
25.0000 mg | ORAL_TABLET | Freq: Every day | ORAL | Status: DC
  Administered 2014-08-28: 25 mg via ORAL
  Filled 2014-08-27: qty 1

## 2014-08-27 MED ORDER — DILTIAZEM HCL 100 MG IV SOLR
5.0000 mg/h | INTRAVENOUS | Status: DC
  Administered 2014-08-27: 5 mg/h via INTRAVENOUS
  Administered 2014-08-28 (×2): 15 mg/h via INTRAVENOUS
  Filled 2014-08-27 (×4): qty 100

## 2014-08-27 MED ORDER — FUROSEMIDE 40 MG PO TABS
40.0000 mg | ORAL_TABLET | Freq: Every day | ORAL | Status: DC
  Administered 2014-08-28: 40 mg via ORAL
  Filled 2014-08-27: qty 1

## 2014-08-27 MED ORDER — FUROSEMIDE 40 MG PO TABS: 40.0000 mg | ORAL_TABLET | Freq: Every day | ORAL | Status: DC

## 2014-08-27 NOTE — ED Notes (Signed)
Xray at the bedside.

## 2014-08-27 NOTE — H&P (Signed)
[facility] History and Physical  CALIA NAPP Dean:[id number] DOB: 1955-05-24 DOA: 08/27/2014  Referring physician: EDP PCP: [name], MD   Chief Complaint: SOB, palpitations   HPI: [name] is a 59 y.o. female h/o turner's syndrome, mild aortic insufficiency.  Patient went to her PCPs office today with respiratory distress and palpitations onset several days ago.  Had been taking delsym for the SOB but this didn't help at all.  Patient found to be in A.Fib with rate between 150-220.  Initially got 20mg  cardizem which caused HR to decrease but BP to decrease to 76 systolic.  300cc bolus given with return in BP.  Patient transported to ED.  In ED EKG demonstrates ongoing A.Fib RVR, started on cardizem gtt, rate currently 130s-140s.  Review of Systems: Systems reviewed.  As above, otherwise negative  Past Medical History  Diagnosis Date  . Turner's syndrome   . Aortic insufficiency   . Hypothyroidism   . SOB (shortness of breath)   . Periorbital edema   . Hypertension   . Cervix abnormality 1993    MILD ATYPICAL ADENOMATOUS HYPERPLASIA OF CERVIX  . Dermatitis 05-26-12    all over  since 9'13  . Edema of both legs     right greater than left  . Speech disorder     occ. "stutter"  . Difficult intubation     will plan on spinal anesthesia"small mouth/airway"   Past Surgical History  Procedure Laterality Date  . Total hip arthroplasty      LEFT  . US echocardiography  06/20/2009    EF 55-60%  . Dilation and curettage of uterus  1993    CONE BIOPSY  . Cervical cone biopsy  1993  . Hernia repair    . Colposcopy    . Joint replacement      hip replacement  . Total knee arthroplasty Right 05/31/2012    Procedure: RIGHT TOTAL KNEE ARTHROPLASTY;  Surgeon: Shelda Pal, MD;  Location: [facility];  Service: Orthopedics;  Laterality: Right;   Social History:  reports that she has never smoked. She has never used smokeless tobacco. She reports that she does not  drink alcohol or use illicit drugs.  Allergies  Allergen Reactions  . Cephalexin     Prefers not to have ? Psoriasis skin condition    Family History  Problem Relation Age of Onset  . Hypertension Mother   . Cancer Mother     SKIN CANCER  . Breast cancer Cousin     MATERNAL COUSIN  . Diabetes Maternal Uncle   . Atrial fibrillation Mother      Prior to Admission medications   Medication Sig Start Date End Date Taking? Authorizing Provider  Cholecalciferol (VITAMIN D3) 2000 UNITS capsule Take 2,000 Units by mouth daily.   Yes Historical Provider, MD  furosemide (LASIX) 40 MG tablet TAKE ONE TABLET BY MOUTH ONCE DAILY BEFORE BREAKFAST 08/20/14  Yes Cassell Clement, MD  KLOR-CON M10 10 MEQ tablet TAKE ONE TABLET BY MOUTH TWICE DAILY 08/13/14  Yes Cassell Clement, MD  levothyroxine (SYNTHROID, LEVOTHROID) 100 MCG tablet Take 1 tablet (100 mcg total) by mouth daily before breakfast. 05/23/13  Yes Cassell Clement, MD  metoprolol (LOPRESSOR) 50 MG tablet Take 25 mg by mouth daily.   Yes Historical Provider, MD   Physical Exam: Filed Vitals:   08/27/14 1945  BP: 117/79  Pulse: 134  Temp:   Resp: 20    BP 117/79 mmHg  Pulse 134  Temp(Src) 97.8 F (36.6 C) (Oral)  Resp 20  SpO2 92%  General Appearance:    Alert, oriented, no distress, appears stated age  Head:    Normocephalic, atraumatic  Eyes:    PERRL, EOMI, sclera non-icteric        Nose:   Nares without drainage or epistaxis. Mucosa, turbinates normal  Throat:   Moist mucous membranes. Oropharynx without erythema or exudate.  Neck:   Supple. No carotid bruits.  No thyromegaly.  No lymphadenopathy.   Back:     No CVA tenderness, no spinal tenderness  Lungs:     Clear to auscultation bilaterally, without wheezes, rhonchi or rales  Chest wall:    No tenderness to palpitation  Heart:     Tachycardic, irregularly irregular.  Abdomen:     Soft, non-tender, nondistended, normal bowel sounds, no organomegaly  Genitalia:     deferred  Rectal:    deferred  Extremities:   No clubbing, cyanosis or edema.  Pulses:   2+ and symmetric all extremities  Skin:   Skin color, texture, turgor normal, no rashes or lesions  Lymph nodes:   Cervical, supraclavicular, and axillary nodes normal  Neurologic:   CNII-XII intact. Normal strength, sensation and reflexes      throughout    Labs on Admission:  Basic Metabolic Panel:  Recent Labs Lab 08/27/14 1841  NA 141  K 3.4*  CL 104  CO2 26  GLUCOSE 90  BUN 32*  CREATININE 0.99  CALCIUM 8.5*   Liver Function Tests: No results for input(s): AST, ALT, ALKPHOS, BILITOT, PROT, ALBUMIN in the last 168 hours. No results for input(s): LIPASE, AMYLASE in the last 168 hours. No results for input(s): AMMONIA in the last 168 hours. CBC:  Recent Labs Lab 08/27/14 1841  WBC 10.4  HGB 13.6  HCT 40.5  MCV 98.1  PLT 282   Cardiac Enzymes:  Recent Labs Lab 08/27/14 1841  TROPONINI 0.05*    BNP (last 3 results) No results for input(s): PROBNP in the last 8760 hours. CBG: No results for input(s): GLUCAP in the last 168 hours.  Radiological Exams on Admission: Dg Chest Port 1 View  08/27/2014   CLINICAL DATA:  Shortness of breath  EXAM: PORTABLE CHEST - 1 VIEW  COMPARISON:  08/15/2012  FINDINGS: Cardiomegaly with pulmonary vascular congestion. No frank interstitial edema. Mild right basilar atelectasis. No pleural effusion or pneumothorax.  IMPRESSION: Cardiomegaly with pulmonary vascular congestion. No frank interstitial edema.   Electronically Signed   By: Charline Bills M.D.   On: 08/27/2014 17:48    EKG: Independently reviewed.  Assessment/Plan Principal Problem:   Atrial fibrillation with RVR Active Problems:   Benign hypertensive heart disease without heart failure   Turner syndrome   Aortic insufficiency   Hypertension   New onset a-fib   1. A.Fib RVR - new onset 1. Cardizem gtt for rate control 2. Will continue her home beta blocker as well that  she takes for HTN 3. CHADS vasc score of 2 - will start on ASA 81, and defer anticoagulation decision to cards 4. Patient sees Dr. Patty Sermons 2. HTN - continue home meds 3. Benign HTN heart disease - 1. getting 2d echo 2. suspect that todays SOB is due to rate mediated heart failure 3. Will continue home lasix    Code Status: Full Code  Family Communication: Mother at bedside Disposition Plan: Admit to inpatient   Time spent: 70 min  Maxden Naji M. [facility] Pager [phone]  If 7AM-7PM, please contact the day team taking care of the patient [email or web address] Password [facility] 08/27/2014, 7:55 PM

## 2014-08-27 NOTE — ED Notes (Signed)
Phelbotomy at the bedside.  

## 2014-08-27 NOTE — ED Notes (Signed)
Called Phlebotomy to get labs drawn.

## 2014-08-27 NOTE — ED Notes (Signed)
Per EMS, Patient was at PCP with Respiratory Distress due to cough multiple days before now. Patient was tachypnic and was found in rapid uncontrolled afib 150-220 Irregular. Pt denies chest pain. NO Hx of the same. Hx of Aortic Valve disease. 18 Gauge in LAC, 6 mg of Adenosine with no change. 5 mg of Cardizem given after MD consult with decrease in HR. BP decreased to 78/46. 300 CC bolus given. Last Vitals per EMS: 153 HR, 24 RR, 100% on RA, 111/79.

## 2014-08-27 NOTE — ED Notes (Signed)
MD at the bedside  

## 2014-08-27 NOTE — ED Provider Notes (Signed)
CSN: [id number]     Arrival date & time 08/27/14  1715 History   First MD Initiated Contact with Patient 08/27/14 1719     Chief Complaint  Patient presents with  . Atrial Fibrillation     (Consider location/radiation/quality/duration/timing/severity/associated sxs/prior Treatment) HPI Comments: The patient is a 59 year old female with Turner syndrome, history of an abnormal valve, history of a recent cough, has been taking cough medications for the last several days. The mother who she lives with reports that she had increasing palpitations and shortness of breath over the last couple of days, this has been persistent, nothing makes it better or worse, she has had now associated shortness of breath. She was sent from her family doctor's office where she was found to be in atrial fibrillation with a rapid ventricular response. The paramedics because of protocol had to give her adenosine 6 mg, this was followed by 20 mg of Cardizem and a resultant hypotension which was treated with an IV fluid bolus. There has been no fevers, chills, abdominal pain, vomiting. She states that she feels very very thirsty and cannot get enough to drink. There has been no other ingestions including stimulant medications or foods, no increased caffeine, no alcohol, no illegal drugs. She has been using Delsym cough syrup  The history is provided by the patient.    Past Medical History  Diagnosis Date  . Turner's syndrome   . Aortic insufficiency   . Hypothyroidism   . SOB (shortness of breath)   . Periorbital edema   . Hypertension   . Cervix abnormality 1993    MILD ATYPICAL ADENOMATOUS HYPERPLASIA OF CERVIX  . Dermatitis 05-26-12    all over  since 9'13  . Edema of both legs     right greater than left  . Speech disorder     occ. "stutter"  . Difficult intubation     will plan on spinal anesthesia"small mouth/airway"   Past Surgical History  Procedure Laterality Date  . Total hip arthroplasty      LEFT   . US echocardiography  06/20/2009    EF 55-60%  . Dilation and curettage of uterus  1993    CONE BIOPSY  . Cervical cone biopsy  1993  . Hernia repair    . Colposcopy    . Joint replacement      hip replacement  . Total knee arthroplasty Right 05/31/2012    Procedure: RIGHT TOTAL KNEE ARTHROPLASTY;  Surgeon: Shelda Pal, MD;  Location: [facility];  Service: Orthopedics;  Laterality: Right;   Family History  Problem Relation Age of Onset  . Hypertension Mother   . Cancer Mother     SKIN CANCER  . Breast cancer Cousin     MATERNAL COUSIN  . Diabetes Maternal Uncle    Social History  Substance Use Topics  . Smoking status: Never Smoker   . Smokeless tobacco: Never Used  . Alcohol Use: No   OB History    Gravida Para Term Preterm AB TAB SAB Ectopic Multiple Living   0              Review of Systems  All other systems reviewed and are negative.     Allergies  Cephalexin  Home Medications   Prior to Admission medications   Medication Sig Start Date End Date Taking? Authorizing Provider  Cholecalciferol (VITAMIN D3) 2000 UNITS capsule Take 2,000 Units by mouth daily.   Yes Historical Provider, MD  furosemide (LASIX) 40 MG  tablet TAKE ONE TABLET BY MOUTH ONCE DAILY BEFORE BREAKFAST 08/20/14  Yes Cassell Clement, MD  KLOR-CON M10 10 MEQ tablet TAKE ONE TABLET BY MOUTH TWICE DAILY 08/13/14  Yes Cassell Clement, MD  levothyroxine (SYNTHROID, LEVOTHROID) 100 MCG tablet Take 1 tablet (100 mcg total) by mouth daily before breakfast. 05/23/13  Yes Cassell Clement, MD  metoprolol (LOPRESSOR) 50 MG tablet Take 25 mg by mouth daily.   Yes Historical Provider, MD   BP 117/79 mmHg  Pulse 134  Temp(Src) 97.8 F (36.6 C) (Oral)  Resp 20  SpO2 92% Physical Exam  Constitutional: She appears well-developed and well-nourished. No distress.  HENT:  Head: Normocephalic and atraumatic.  Mouth/Throat: Oropharynx is clear and moist. No oropharyngeal exudate.  Eyes: Conjunctivae and EOM  are normal. Pupils are equal, round, and reactive to light. Right eye exhibits no discharge. Left eye exhibits no discharge. No scleral icterus.  Neck: Normal range of motion. Neck supple. No JVD present. No thyromegaly present.  Cardiovascular: Normal heart sounds and intact distal pulses.  Exam reveals no gallop and no friction rub.   No murmur heard. Irregularly irregular rhythm, pulses palpable at the radial arteries  Pulmonary/Chest: She is in respiratory distress. She has no wheezes. She has rales (slight rales at the bases, clears with coughing, mild tachypnea).  Abdominal: Soft. Bowel sounds are normal. She exhibits no distension and no mass. There is no tenderness.  Musculoskeletal: Normal range of motion. She exhibits edema (asymmetry of the lower extremities right greater than left, this is a chronic finding per the mother). She exhibits no tenderness.  Lymphadenopathy:    She has no cervical adenopathy.  Neurological: She is alert. Coordination normal.  Skin: Skin is warm and dry. No rash noted. No erythema.  Psychiatric: She has a normal mood and affect. Her behavior is normal.  Nursing note and vitals reviewed.   ED Course  Procedures (including critical care time) Labs Review Labs Reviewed  BASIC METABOLIC PANEL - Abnormal; Notable for the following:    Potassium 3.4 (*)    BUN 32 (*)    Calcium 8.5 (*)    All other components within normal limits  TROPONIN I - Abnormal; Notable for the following:    Troponin I 0.05 (*)    All other components within normal limits  APTT  PROTIME-INR  CBC  CBC  BASIC METABOLIC PANEL    Imaging Review Dg Chest Port 1 View  08/27/2014   CLINICAL DATA:  Shortness of breath  EXAM: PORTABLE CHEST - 1 VIEW  COMPARISON:  08/15/2012  FINDINGS: Cardiomegaly with pulmonary vascular congestion. No frank interstitial edema. Mild right basilar atelectasis. No pleural effusion or pneumothorax.  IMPRESSION: Cardiomegaly with pulmonary vascular  congestion. No frank interstitial edema.   Electronically Signed   By: Charline Bills M.D.   On: 08/27/2014 17:48   I have personally reviewed and evaluated these images and lab results as part of my medical decision-making.   EKG Interpretation   Date/Time:  Monday August 27 2014 17:20:23 EDT Ventricular Rate:  147 PR Interval:  62 QRS Duration: 81 QT Interval:  276 QTC Calculation: 432 R Axis:   38 Text Interpretation:  Atrial fibrillation with rapid ventricular response  Probable anterior infarct, age indeterminate Abnormal ekg No old tracing  to compare Confirmed by Printice Hellmer  MD, Celinda Dethlefs ([id number]) on 08/27/2014 7:29:09 PM      MDM   Final diagnoses:  Atrial fibrillation with RVR    The patient has atrial  fibrillation on the monitor, her prehospital EKG was evaluated from the doctor's office with a heart rate of approximately 125 bpm consistent with atrial fibrillation with a rapid ventricular response. She will need further evaluation as to the source, rate control, anticipate admission given her associated shortness of breath. The patient and her mother are in agreement with the plan.  cardizem drip in process for afib - rate difficult to control  I have personally viewed and interpreted the imaging and agree with radiologist interpretation.  D/c Hospitalist - he will see in ED and admit.  CRITICAL CARE Performed by: Vida Roller Total critical care time: 35 Critical care time was exclusive of separately billable procedures and treating other patients. Critical care was necessary to treat or prevent imminent or life-threatening deterioration. Critical care was time spent personally by me on the following activities: development of treatment plan with patient and/or surrogate as well as nursing, discussions with consultants, evaluation of patient's response to treatment, examination of patient, obtaining history from patient or surrogate, ordering and performing treatments  and interventions, ordering and review of laboratory studies, ordering and review of radiographic studies, pulse oximetry and re-evaluation of patient's condition.   Meds given in ED:  Medications  diltiazem (CARDIZEM) 1 mg/mL load via infusion 10 mg (10 mg Intravenous Given 08/27/14 1821)    And  diltiazem (CARDIZEM) 100 mg in dextrose 5 % 100 mL (1 mg/mL) infusion (10 mg/hr Intravenous Rate/Dose Change 08/27/14 1954)  furosemide (LASIX) tablet 40 mg (not administered)  potassium chloride SA (K-DUR,KLOR-CON) CR tablet 10 mEq (not administered)  levothyroxine (SYNTHROID, LEVOTHROID) tablet 100 mcg (not administered)  metoprolol tartrate (LOPRESSOR) tablet 25 mg (not administered)  heparin injection 5,000 Units (not administered)    New Prescriptions   No medications on file      Eber Hong, MD 08/27/14 [phone]

## 2014-08-27 NOTE — ED Notes (Signed)
Attempted Report x1.   

## 2014-08-27 NOTE — Telephone Encounter (Signed)
New Message  Pt mother calling to speak w/ RN about pt current condition. Pt mother stated:  last few days, pt is throwing up, consistent cough, and c/o palpitations, and "fast breathing"  Patient c/o Palpitations:  High priority if patient c/o lightheadedness and shortness of breath.  1. How long have you been having palpitations? Some Sat 8/20, sun 8/21  2. Are you currently experiencing lightheadedness and shortness of breath?no  3. Have you checked your BP and heart rate? (document readings) n/a  4. Are you experiencing any other symptoms? no

## 2014-08-27 NOTE — Telephone Encounter (Signed)
I spoke with the patient's mother. The patient developed a dry cough at the beginning of last week. She has had some vomiting associated with this, but mostly when a coughing spell occurs. She has had no reported fevers. She complained of feeling her heart palpating intermittently on Saturday. She has not complained of this since that time. She has had intermittent "fast breathing." Per the patient's mother, they did call her PCP last week and she was instructed to have the patient eat crackers and white bread. This is all she has had for about the last 2 days. She has been drinking water. I advised the patient's mother that she should continue to have her drink fluid and increase her diet as tolerated. I explained that her symptoms over the weekend may be related to some dehydration. She is weak today, but has been very inactive over the weekend. I advised that with increased fluid/ food intake and increased activity, that her weakness should improve. I advised she should call the patient's  PCP if symptoms persist.

## 2014-08-28 ENCOUNTER — Encounter (HOSPITAL_COMMUNITY): Payer: Self-pay | Admitting: Physician Assistant

## 2014-08-28 ENCOUNTER — Telehealth: Payer: Self-pay | Admitting: Cardiology

## 2014-08-28 ENCOUNTER — Ambulatory Visit (HOSPITAL_COMMUNITY): Payer: Medicaid Other | Attending: Internal Medicine

## 2014-08-28 DIAGNOSIS — I10 Essential (primary) hypertension: Secondary | ICD-10-CM | POA: Insufficient documentation

## 2014-08-28 DIAGNOSIS — I77819 Aortic ectasia, unspecified site: Secondary | ICD-10-CM | POA: Insufficient documentation

## 2014-08-28 DIAGNOSIS — I351 Nonrheumatic aortic (valve) insufficiency: Secondary | ICD-10-CM | POA: Insufficient documentation

## 2014-08-28 DIAGNOSIS — I350 Nonrheumatic aortic (valve) stenosis: Secondary | ICD-10-CM

## 2014-08-28 DIAGNOSIS — I517 Cardiomegaly: Secondary | ICD-10-CM | POA: Insufficient documentation

## 2014-08-28 DIAGNOSIS — R7989 Other specified abnormal findings of blood chemistry: Secondary | ICD-10-CM

## 2014-08-28 DIAGNOSIS — I4891 Unspecified atrial fibrillation: Secondary | ICD-10-CM

## 2014-08-28 DIAGNOSIS — I340 Nonrheumatic mitral (valve) insufficiency: Secondary | ICD-10-CM | POA: Insufficient documentation

## 2014-08-28 DIAGNOSIS — I5031 Acute diastolic (congestive) heart failure: Secondary | ICD-10-CM

## 2014-08-28 LAB — CBC
HEMATOCRIT: 41.4 % (ref 36.0–46.0)
Hemoglobin: 13.5 g/dL (ref 12.0–15.0)
MCH: 32.4 pg (ref 26.0–34.0)
MCHC: 32.6 g/dL (ref 30.0–36.0)
MCV: 99.3 fL (ref 78.0–100.0)
PLATELETS: 249 10*3/uL (ref 150–400)
RBC: 4.17 MIL/uL (ref 3.87–5.11)
RDW: 15.3 % (ref 11.5–15.5)
WBC: 9.1 10*3/uL (ref 4.0–10.5)

## 2014-08-28 LAB — TROPONIN I
Troponin I: 0.04 ng/mL — ABNORMAL HIGH (ref <0.031)
Troponin I: 0.04 ng/mL — ABNORMAL HIGH (ref <0.031)
Troponin I: 0.04 ng/mL — ABNORMAL HIGH (ref <0.031)

## 2014-08-28 LAB — BASIC METABOLIC PANEL
Anion gap: 9 (ref 5–15)
BUN: 27 mg/dL — AB (ref 6–20)
CALCIUM: 8.3 mg/dL — AB (ref 8.9–10.3)
CO2: 27 mmol/L (ref 22–32)
CREATININE: 0.79 mg/dL (ref 0.44–1.00)
Chloride: 106 mmol/L (ref 101–111)
GFR calc Af Amer: >60 mL/min (ref >60)
GFR calc non Af Amer: >60 mL/min (ref >60)
GLUCOSE: 108 mg/dL — AB (ref 65–99)
Potassium: 3.5 mmol/L (ref 3.5–5.1)
Sodium: 142 mmol/L (ref 135–145)

## 2014-08-28 LAB — T4, FREE: FREE T4: 1.43 ng/dL — AB (ref 0.61–1.12)

## 2014-08-28 LAB — TSH: TSH: 0.37 u[IU]/mL (ref 0.350–4.500)

## 2014-08-28 LAB — HEPARIN LEVEL (UNFRACTIONATED): HEPARIN UNFRACTIONATED: 0.87 [IU]/mL — AB (ref 0.30–0.70)

## 2014-08-28 LAB — D-DIMER, QUANTITATIVE (NOT AT ARMC): D DIMER QUANT: 3.14 ug{FEU}/mL — AB (ref 0.00–0.48)

## 2014-08-28 LAB — MRSA PCR SCREENING: MRSA by PCR: NEGATIVE

## 2014-08-28 MED ORDER — POTASSIUM CHLORIDE CRYS ER 20 MEQ PO TBCR
40.0000 meq | EXTENDED_RELEASE_TABLET | Freq: Once | ORAL | Status: AC
  Administered 2014-08-28: 40 meq via ORAL
  Filled 2014-08-28: qty 2

## 2014-08-28 MED ORDER — DILTIAZEM HCL 30 MG PO TABS
90.0000 mg | ORAL_TABLET | Freq: Four times a day (QID) | ORAL | Status: DC
  Administered 2014-08-28 – 2014-08-30 (×8): 90 mg via ORAL
  Filled 2014-08-28 (×16): qty 1

## 2014-08-28 MED ORDER — POTASSIUM CHLORIDE CRYS ER 20 MEQ PO TBCR: 40.0000 meq | EXTENDED_RELEASE_TABLET | Freq: Every day | ORAL | Status: DC

## 2014-08-28 MED ORDER — HEPARIN (PORCINE) IN NACL 100-0.45 UNIT/ML-% IJ SOLN
800.0000 [IU]/h | INTRAMUSCULAR | Status: DC
  Administered 2014-08-28: 900 [IU]/h via INTRAVENOUS
  Filled 2014-08-28: qty 250

## 2014-08-28 NOTE — Progress Notes (Signed)
[facility] PROGRESS NOTE   [name] ZOX:[id number] DOB: 1955/10/18 DOA: 08/27/2014 PCP: [name], MD  HPI/Subjective: Seen with mother at bedside.Denies chest pain or shortness of breath Very anxious about the new diagnosis.  Assessment/Plan: Principal Problem:   Atrial fibrillation with RVR Active Problems:   Benign hypertensive heart disease without heart failure   Turner syndrome   Aortic insufficiency   Hypertension   New onset a-fib   Atrial fibrillation with rapid ventricular response New onset atrial fibrillation, presented with heart rate of 130-140. Started on Cardizem drip, she is already on beta blockers for hypertension. Cardiology consulted for further atrial fibrillation management. This patients CHA2DS2-VASc Score and unadjusted Ischemic Stroke Rate (% per year) is equal to 2.2 % stroke rate/year from a score of 2Female gender and hypertension Above score calculated as 1 point each if present [CHF, HTN, DM, Vascular=MI/PAD/Aortic Plaque, Age if 65-74, or Female] Above score calculated as 2 points each if present [Age > 75, or Stroke/TIA/TE]  Hypertension Continued home medications.  Aortic insufficiency Along with history of Turner syndrome, now with atrial fibrillation. Obtain 2-D echocardiogram.  Turner syndrome  Code Status: Full Code Family Communication: Plan discussed with the patient. Disposition Plan: Remains inpatient Diet: Diet Heart Room service appropriate?: Yes; Fluid consistency:: Thin  Consultants:  cardiology  Procedures:  none  Antibiotics:  none   Objective: Filed Vitals:   08/28/14 1302  BP: 126/81  Pulse: 64  Temp:   Resp:     Intake/Output Summary (Last 24 hours) at 08/28/14 1426 Last data filed at 08/28/14 0100  Gross per 24 hour  Intake    240 ml  Output    700 ml  Net   -460 ml   Filed Weights   08/27/14 2135  Weight: 59.92 kg (132 lb 1.6 oz)    Exam: General: Alert and awake,  oriented x3, not in any acute distress. HEENT: anicteric sclera, pupils reactive to light and accommodation, EOMI CVS: S1-S2 clear, no murmur rubs or gallops Chest: clear to auscultation bilaterally, no wheezing, rales or rhonchi Abdomen: soft nontender, nondistended, normal bowel sounds, no organomegaly Extremities: no cyanosis, clubbing or edema noted bilaterally Neuro: Cranial nerves II-XII intact, no focal neurological deficits  Data Reviewed: Basic Metabolic Panel:  Recent Labs Lab 08/27/14 1841 08/28/14 0530  NA 141 142  K 3.4* 3.5  CL 104 106  CO2 26 27  GLUCOSE 90 108*  BUN 32* 27*  CREATININE 0.99 0.79  CALCIUM 8.5* 8.3*   Liver Function Tests: No results for input(s): AST, ALT, ALKPHOS, BILITOT, PROT, ALBUMIN in the last 168 hours. No results for input(s): LIPASE, AMYLASE in the last 168 hours. No results for input(s): AMMONIA in the last 168 hours. CBC:  Recent Labs Lab 08/27/14 1841 08/28/14 0530  WBC 10.4 9.1  HGB 13.6 13.5  HCT 40.5 41.4  MCV 98.1 99.3  PLT 282 249   Cardiac Enzymes:  Recent Labs Lab 08/27/14 1841 08/28/14 0935  TROPONINI 0.05* 0.04*   BNP (last 3 results) No results for input(s): BNP in the last 8760 hours.  ProBNP (last 3 results) No results for input(s): PROBNP in the last 8760 hours.  CBG: No results for input(s): GLUCAP in the last 168 hours.  Micro Recent Results (from the past 240 hour(s))  MRSA PCR Screening     Status: None   Collection Time: 08/27/14 11:07 PM  Result Value Ref Range Status   MRSA by PCR NEGATIVE NEGATIVE Final  Comment:        The GeneXpert MRSA Assay (FDA approved for NASAL specimens only), is one component of a comprehensive MRSA colonization surveillance program. It is not intended to diagnose MRSA infection nor to guide or monitor treatment for MRSA infections.      Studies: Dg Chest Port 1 View  08/27/2014   CLINICAL DATA:  Shortness of breath  EXAM: PORTABLE CHEST - 1 VIEW   COMPARISON:  08/15/2012  FINDINGS: Cardiomegaly with pulmonary vascular congestion. No frank interstitial edema. Mild right basilar atelectasis. No pleural effusion or pneumothorax.  IMPRESSION: Cardiomegaly with pulmonary vascular congestion. No frank interstitial edema.   Electronically Signed   By: Charline Bills M.D.   On: 08/27/2014 17:48    Scheduled Meds: . aspirin EC  81 mg Oral Daily  . diltiazem  90 mg Oral 4 times per day  . levothyroxine  100 mcg Oral QAC breakfast  . potassium chloride  40 mEq Oral Once   Continuous Infusions: . heparin         Time spent: 35 minutes    [facility]  [facility] Pager [phone] If 7PM-7AM, please contact night-coverage at [email or web address], password [facility] 08/28/2014, 2:26 PM  LOS: 1 day

## 2014-08-28 NOTE — Clinical Documentation Improvement (Signed)
Internal Medicine  Based on the clinical findings below, please further clarify the diagnosis of Respiratory Distress documented in H&P.    Acute Respiratory Distress  Chronic Respiratory Distress  Other  Clinically Undetermined  Supporting Information:   Presents with RAF with RVR and Acute Diastolic CHF  RR ranging from 20 to 33  Please exercise your independent, professional judgment when responding. A specific answer is not anticipated or expected.  Thank You, Shellee Milo Health Information Management  [phone]

## 2014-08-28 NOTE — Progress Notes (Signed)
UR Completed Danh Bayus Graves-Bigelow, RN,BSN [phone]  

## 2014-08-28 NOTE — Progress Notes (Signed)
  Echocardiogram 2D Echocardiogram has been performed.  [name] 08/28/2014, 11:26 AM

## 2014-08-28 NOTE — Telephone Encounter (Signed)
Will forward to  Dr. Brackbill so he will be aware 

## 2014-08-28 NOTE — Telephone Encounter (Signed)
New message     Pt mother wanting to let Dr. Patty Sermons know that pt is in [facility] hospital Mother states pt has Afib Mother did not want to leave a phone number for return call Pt is in 3 west room 5

## 2014-08-28 NOTE — Progress Notes (Signed)
ANTICOAGULATION CONSULT NOTE Pharmacy Consult for heparin Indication: atrial fibrillation  Allergies  Allergen Reactions  . Cephalexin     Prefers not to have ? Psoriasis skin condition    Patient Measurements: Height:  (142.2 cm) Weight: 132 lb 1.6 oz (59.92 kg) IBW/kg (Calculated) : 36.3  Vital Signs: Temp: 98.2 F (36.8 C) (08/23 2000) Temp Source: Oral (08/23 2000) BP: 133/87 mmHg (08/23 2100) Pulse Rate: 72 (08/23 2100)  Labs:  Recent Labs  08/27/14 1841 08/28/14 0530 08/28/14 0935 08/28/14 1505 08/28/14 2203  HGB 13.6 13.5  --   --   --   HCT 40.5 41.4  --   --   --   PLT 282 249  --   --   --   APTT 25  --   --   --   --   LABPROT 15.2  --   --   --   --   INR 1.18  --   --   --   --   HEPARINUNFRC  --   --   --   --  0.87*  CREATININE 0.99 0.79  --   --   --   TROPONINI 0.05*  --  0.04* 0.04* 0.04*    Estimated Creatinine Clearance: 54.6 mL/min (by C-G formula based on Cr of 0.79).  Assessment: 59 yo female with afib for heparin Goal of Therapy:  Heparin level 0.3-0.7 units/ml Monitor platelets by anticoagulation protocol: Yes   Plan:  Decrease Heparin 800 units/hr Follow-up am labs.   Eddie Candle 08/28/2014,11:45 PM

## 2014-08-28 NOTE — Progress Notes (Signed)
D-dimer came back 3.14. Dr. Royann Shivers notified. Patient already started on heparin. Patient is stable, will continue to monitor.

## 2014-08-28 NOTE — Progress Notes (Signed)
ANTICOAGULATION CONSULT NOTE - Initial Consult  Pharmacy Consult for heparin Indication: atrial fibrillation  Allergies  Allergen Reactions  . Cephalexin     Prefers not to have ? Psoriasis skin condition    Patient Measurements: Height:  (142.2 cm) Weight: 132 lb 1.6 oz (59.92 kg) IBW/kg (Calculated) : 36.3  Vital Signs: Temp: 98.2 F (36.8 C) (08/23 0849) Temp Source: Oral (08/23 0849) BP: 126/81 mmHg (08/23 1302) Pulse Rate: 64 (08/23 1302)  Labs:  Recent Labs  08/27/14 1841 08/28/14 0530 08/28/14 0935  HGB 13.6 13.5  --   HCT 40.5 41.4  --   PLT 282 249  --   APTT 25  --   --   LABPROT 15.2  --   --   INR 1.18  --   --   CREATININE 0.99 0.79  --   TROPONINI 0.05*  --  0.04*    Estimated Creatinine Clearance: 54.6 mL/min (by C-G formula based on Cr of 0.79).   Medical History: Past Medical History  Diagnosis Date  . Turner's syndrome   . Aortic insufficiency     a. Mod AI by echo 2013.  Marland Kitchen Hypothyroidism   . Periorbital edema   . Hypertension   . Cervix abnormality 1993    MILD ATYPICAL ADENOMATOUS HYPERPLASIA OF CERVIX  . Dermatitis 05-26-12    all over  since 9'13  . Edema of both legs     a. right greater than left, due to Turner's syndrome.  Marland Kitchen Speech disorder     occ. "stutter"  . Difficult intubation     will plan on spinal anesthesia"small mouth/airway"  . Abdominal aortic stenosis     a. Mild AS bye cho 2013.    Medications:  Infusions:  . heparin      Assessment: 58 yof presented to the hospital with SOB and palpitation. Found to be in afib and now adding heparin IV for anticoagulation. She was not on anticoagulation PTA. CBC and INR are WNL. Pt did just receive a dose of heparin SQ at 1330 today.   Goal of Therapy:  Heparin level 0.3-0.7 units/ml Monitor platelets by anticoagulation protocol: Yes   Plan:  - Heparin gtt 900 units/hr - no bolus to start as pt just received a dose of heparin SQ, likely can bolus as needed  after first heparin level is reported - Check an 8 hour heparin level - Daily heparin level and CBC - F/u oral AC plans  Francene Mcerlean, Drake Leach 08/28/2014,1:49 PM

## 2014-08-28 NOTE — Consult Note (Signed)
Cardiology Consultation Note  Patient ID: [name], MRN: [id number], DOB/AGE: 14-May-1955 59 y.o. Admit date: 08/27/2014   Date of Consult: 08/28/2014 Primary Physician: [name], MD Primary Cardiologist: Patty Sermons  Chief Complaint: SOB Reason for Consultation: atrial fib  HPI: [name] is a 59 y/o F with history of Turner's syndrome, chronic lifelong edema 2/2 Turner's syndrome, hypothyroidism, HTN, OA, OSA (reports compliance w/ CPAP), mod AI/mild AS and grade 2 DD by echo 2013 (EF 55-60%) who presented to [facility] with SOB x 1 week and was found to have new onset atrial fibrillation. She says for the last week she has had weakness, dyspnea, orthopnea, and palpitations. She had been taking Delsym for the SOB but this did not help. Symptoms worsened this past weekend a few days ago. Yesterday she saw her PCP who found her to be in AF RVR and called EMS. The paramedics because of protocol gave her adenosine 6 mg, which was followed by 20 mg of Cardizem and a resultant hypotension which was treated with an IV fluid bolus. She has since been managed on a cardizem drip with improvement in HR to 70s. K 3.4, troponins 0.05->0.05, CBC wnl. CXR showed cardiomegaly with pulm vascular congestion; no frank interstitial edema. She was started on oral Lasix for possible CHF as well as Lopressor once daily. She denies any recent or prior chest pain. SOB has improved but she still reports feeling "weak." Pulse ox intermittently low down to 88% on RA today, up to 94% on [place]. She reports chronic edema which is unchanged. Denies any bleeding or frequent falls - last fall was in 2012. 2D echo reading pending. Her mom reports she's lost a little weight due to not eating well.   Past Medical History  Diagnosis Date  . Turner's syndrome   . Aortic insufficiency   . Hypothyroidism   . Periorbital edema   . Hypertension   . Cervix abnormality 1993    MILD ATYPICAL ADENOMATOUS HYPERPLASIA OF  CERVIX  . Dermatitis 05-26-12    all over  since 9'13  . Edema of both legs     a. right greater than left, due to Turner's syndrome.  Marland Kitchen Speech disorder     occ. "stutter"  . Difficult intubation     will plan on spinal anesthesia"small mouth/airway"      Most Recent Cardiac Studies: 2D echo 10/2011 - Left ventricle: The cavity size was normal. Wall thickness was normal. Systolic function was normal. The estimated ejection fraction was in the range of 55% to 60%. Wall motion was normal; there were no regional wall motion abnormalities. Features are consistent with a pseudonormal left ventricular filling pattern, with concomitant abnormal relaxation and increased filling pressure (grade 2 diastolic dysfunction). - Aortic valve: There was mild stenosis. Moderate regurgitation. - Left atrium: The atrium was moderately dilated. - Right atrium: The atrium was mildly dilated. - Atrial septum: The septum bowed from left to right, consistent with increased left atrial pressure.   Surgical History:  Past Surgical History  Procedure Laterality Date  . Total hip arthroplasty      LEFT  . US echocardiography  06/20/2009    EF 55-60%  . Dilation and curettage of uterus  1993    CONE BIOPSY  . Cervical cone biopsy  1993  . Hernia repair    . Colposcopy    . Joint replacement      hip replacement  . Total knee arthroplasty Right 05/31/2012  Procedure: RIGHT TOTAL KNEE ARTHROPLASTY;  Surgeon: Shelda Pal, MD;  Location: [facility];  Service: Orthopedics;  Laterality: Right;     Home Meds: Prior to Admission medications   Medication Sig Start Date End Date Taking? Authorizing Provider  Cholecalciferol (VITAMIN D3) 2000 UNITS capsule Take 2,000 Units by mouth daily.   Yes Historical Provider, MD  furosemide (LASIX) 40 MG tablet TAKE ONE TABLET BY MOUTH ONCE DAILY BEFORE BREAKFAST 08/20/14  Yes Cassell Clement, MD  KLOR-CON M10 10 MEQ tablet TAKE ONE TABLET BY MOUTH  TWICE DAILY 08/13/14  Yes Cassell Clement, MD  levothyroxine (SYNTHROID, LEVOTHROID) 100 MCG tablet Take 1 tablet (100 mcg total) by mouth daily before breakfast. 05/23/13  Yes Cassell Clement, MD  metoprolol (LOPRESSOR) 50 MG tablet Take 25 mg by mouth daily.   Yes Historical Provider, MD    Inpatient Medications:  . aspirin EC  81 mg Oral Daily  . furosemide  40 mg Oral Daily  . heparin  5,000 Units Subcutaneous 3 times per day  . levothyroxine  100 mcg Oral QAC breakfast  . metoprolol  25 mg Oral Daily  . potassium chloride  10 mEq Oral BID   . diltiazem (CARDIZEM) infusion 15 mg/hr (08/28/14 0552)    Allergies:  Allergies  Allergen Reactions  . Cephalexin     Prefers not to have ? Psoriasis skin condition    Social History   Social History  . Marital Status: Single    Spouse Name: N/A  . Number of Children: N/A  . Years of Education: N/A   Occupational History  . N/A    Social History Main Topics  . Smoking status: Never Smoker   . Smokeless tobacco: Never Used  . Alcohol Use: No  . Drug Use: No  . Sexual Activity: No   Other Topics Concern  . Not on file   Social History Narrative     Family History  Problem Relation Age of Onset  . Hypertension Mother   . Cancer Mother     SKIN CANCER  . Breast cancer Cousin     MATERNAL COUSIN  . Diabetes Maternal Uncle   . Atrial fibrillation Mother      Review of Systems: Denies syncope. All other systems reviewed and are otherwise negative except as noted above.  Labs:  Recent Labs  08/27/14 1841 08/28/14 0935  TROPONINI 0.05* 0.04*   Lab Results  Component Value Date   WBC 9.1 08/28/2014   HGB 13.5 08/28/2014   HCT 41.4 08/28/2014   MCV 99.3 08/28/2014   PLT 249 08/28/2014    Recent Labs Lab 08/28/14 0530  NA 142  K 3.5  CL 106  CO2 27  BUN 27*  CREATININE 0.79  CALCIUM 8.3*  GLUCOSE 108*   Lab Results  Component Value Date   CHOL 166 03/30/2012   HDL 44.50 03/30/2012   LDLCALC 96  03/30/2012   TRIG 129.0 03/30/2012   No results found for: DDIMER  Radiology/Studies:  Dg Chest Port 1 View  08/27/2014   CLINICAL DATA:  Shortness of breath  EXAM: PORTABLE CHEST - 1 VIEW  COMPARISON:  08/15/2012  FINDINGS: Cardiomegaly with pulmonary vascular congestion. No frank interstitial edema. Mild right basilar atelectasis. No pleural effusion or pneumothorax.  IMPRESSION: Cardiomegaly with pulmonary vascular congestion. No frank interstitial edema.   Electronically Signed   By: Charline Bills M.D.   On: 08/27/2014 17:48    Wt Readings from Last 3 Encounters:  08/27/14 132  lb 1.6 oz (59.92 kg)  06/06/14 142 lb 12.8 oz (64.774 kg)  03/20/14 145 lb 12.8 oz (66.134 kg)   EKG: atrial fib with RVR, low voltage QRS, possible prior anterolateral infarct, poor R wave conduction, nonspecific ST-TW changes  Physical Exam: Blood pressure 111/79, pulse 87, temperature 98.2 F (36.8 C), temperature source Oral, resp. rate 23, height 4\' 8"  (1.422 m), weight 132 lb 1.6 oz (59.92 kg), SpO2 88 %. General: Well developed, well nourished WF in no acute distress. Head: Atraumatic, sclera non-icteric, no xanthomas, nares are without discharge. Webbed neck. Neck: Negative for carotid bruits. JVD not elevated. Lungs: Clear bilaterally to auscultation without wheezes, rales, or rhonchi. Breathing is unlabored. Heart: RRR with S1 S2. Soft murmur at LSB difficult to tell if diastolic or systolic. No rubs or gallops appreciated. Abdomen: Soft, non-tender, non-distended with normoactive bowel sounds. No hepatomegaly. No rebound/guarding. No obvious abdominal masses. Msk:  Strength and tone appear normal for age. Extremities: No clubbing or cyanosis. 1+ soft BLE edema. No venous cords or erythema. Distal pedal pulses are 2+ and equal bilaterally. Neuro: Alert and oriented X 3 with slightly slowed cadence of speech. No facial asymmetry. Moves all extremities spontaneously. Psych:  Responds to questions  appropriately with a normal affect.    Assessment and Plan:   1. Newly diagnosed atrial fibrillation 2. Probable mild acute diastolic CHF 3. Mildly elevated troponin 4. Turner's syndrome with chronic edema 5. Hypothyroidism  Signed, Ronie Spies PA-C 08/28/2014, 1:20 PM Pager: [phone] As above, patient seen and examined. Briefly she is a 59 year old female with past medical history of Turner's syndrome, aortic stenosis/aortic insufficiency, hypothyroidism, hypertension, sleep apnea who we are asked to evaluate for atrial fibrillation. The patient complains of increasing dyspnea on exertion for approximately 2 weeks. There is orthopnea but no PND or pedal edema. No chest pain. She has had palpitations since Saturday, August 20. She presented and was admitted for new onset atrial fibrillation. Cardiology asked to evaluate. Physical exam is consistent with Turner's syndrome. Electrocardiogram shows atrial fibrillation with rapid ventricular response. Prior anterior infarct cannot be excluded.  1 New onset atrial fibrillation-continue Cardizem for rate control. Discontinue metoprolol. Schedule echocardiogram to assess LV function. Check TSH. CHADSvasc 2. Plan to add heparin. Once it is clear all procedures are complete we will anticoagulate long-term with apixaban. If her rate can be controlled and her symptoms improved then with planned cardioversion 3 weeks after anticoagulation initiated. If rate is difficult to control or she remains symptomatic despite rate control she would require TEE guided cardioversion once aortic valve has been completely evaluated and we are sure she does not require aortic valve replacement. 2 aortic stenosis/aortic insufficiency-given the patient's history of Turner's syndrome I am concerned about the possibility of bicuspid aortic valve as well as coarctation. Plan echocardiogram to assess aortic valve morphology and severity of aortic stenosis/aortic insufficiency. Would  also proceed with CTA of the thoracic aorta when it is clear renal function is stable. 3 acute diastolic congestive heart failure-likely related to new onset atrial fibrillation. She is to receive Lasix 40 mg today. We will dose further based on follow-up symptoms. Follow renal function. 4 mildly elevated troponin-patient is not having chest pain. Minimal elevation is not diagnostic of acute coronary syndrome. Olga Millers

## 2014-08-28 NOTE — Progress Notes (Signed)
CPAP set up and patient placed on auto titrate settings (5-15) cm H20 via FFM with 2.5 L O2 bleed in.  Patient tolerating well at this time.

## 2014-08-29 ENCOUNTER — Inpatient Hospital Stay (HOSPITAL_COMMUNITY): Payer: Medicaid Other

## 2014-08-29 ENCOUNTER — Encounter (HOSPITAL_COMMUNITY): Payer: Self-pay | Admitting: Radiology

## 2014-08-29 DIAGNOSIS — I5021 Acute systolic (congestive) heart failure: Secondary | ICD-10-CM

## 2014-08-29 DIAGNOSIS — D72829 Elevated white blood cell count, unspecified: Secondary | ICD-10-CM

## 2014-08-29 DIAGNOSIS — J9601 Acute respiratory failure with hypoxia: Secondary | ICD-10-CM | POA: Insufficient documentation

## 2014-08-29 DIAGNOSIS — I351 Nonrheumatic aortic (valve) insufficiency: Secondary | ICD-10-CM

## 2014-08-29 DIAGNOSIS — G4733 Obstructive sleep apnea (adult) (pediatric): Secondary | ICD-10-CM

## 2014-08-29 DIAGNOSIS — I5032 Chronic diastolic (congestive) heart failure: Secondary | ICD-10-CM

## 2014-08-29 DIAGNOSIS — Q969 Turner's syndrome, unspecified: Secondary | ICD-10-CM

## 2014-08-29 HISTORY — DX: Chronic diastolic (congestive) heart failure: I50.32

## 2014-08-29 LAB — HEPARIN LEVEL (UNFRACTIONATED)
HEPARIN UNFRACTIONATED: 1.1 [IU]/mL — AB (ref 0.30–0.70)
Heparin Unfractionated: 0.88 IU/mL — ABNORMAL HIGH (ref 0.30–0.70)

## 2014-08-29 LAB — CBC
HCT: 43.6 % (ref 36.0–46.0)
Hemoglobin: 14.5 g/dL (ref 12.0–15.0)
MCH: 33 pg (ref 26.0–34.0)
MCHC: 33.3 g/dL (ref 30.0–36.0)
MCV: 99.3 fL (ref 78.0–100.0)
PLATELETS: 299 10*3/uL (ref 150–400)
RBC: 4.39 MIL/uL (ref 3.87–5.11)
RDW: 15.4 % (ref 11.5–15.5)
WBC: 15.2 10*3/uL — ABNORMAL HIGH (ref 4.0–10.5)

## 2014-08-29 LAB — HEPATIC FUNCTION PANEL
ALT: 78 U/L — ABNORMAL HIGH (ref 14–54)
AST: 40 U/L (ref 15–41)
Albumin: 3.4 g/dL — ABNORMAL LOW (ref 3.5–5.0)
Alkaline Phosphatase: 115 U/L (ref 38–126)
BILIRUBIN DIRECT: 0.2 mg/dL (ref 0.1–0.5)
BILIRUBIN INDIRECT: 0.7 mg/dL (ref 0.3–0.9)
BILIRUBIN TOTAL: 0.9 mg/dL (ref 0.3–1.2)
Total Protein: 5.9 g/dL — ABNORMAL LOW (ref 6.5–8.1)

## 2014-08-29 LAB — BASIC METABOLIC PANEL
Anion gap: 10 (ref 5–15)
BUN: 24 mg/dL — AB (ref 6–20)
CALCIUM: 8.6 mg/dL — AB (ref 8.9–10.3)
CHLORIDE: 102 mmol/L (ref 101–111)
CO2: 25 mmol/L (ref 22–32)
CREATININE: 0.91 mg/dL (ref 0.44–1.00)
GFR calc Af Amer: >60 mL/min (ref >60)
GFR calc non Af Amer: >60 mL/min (ref >60)
GLUCOSE: 144 mg/dL — AB (ref 65–99)
Potassium: 4.5 mmol/L (ref 3.5–5.1)
Sodium: 137 mmol/L (ref 135–145)

## 2014-08-29 LAB — URINALYSIS, ROUTINE W REFLEX MICROSCOPIC
Bilirubin Urine: NEGATIVE
Glucose, UA: NEGATIVE mg/dL
KETONES UR: NEGATIVE mg/dL
NITRITE: NEGATIVE
PH: 5 (ref 5.0–8.0)
PROTEIN: NEGATIVE mg/dL
Specific Gravity, Urine: 1.019 (ref 1.005–1.030)
Urobilinogen, UA: 0.2 mg/dL (ref 0.0–1.0)

## 2014-08-29 LAB — LIPID PANEL
Cholesterol: 163 mg/dL (ref 0–200)
HDL: 26 mg/dL — AB (ref >40)
LDL CALC: 104 mg/dL — AB (ref 0–99)
TRIGLYCERIDES: 163 mg/dL — AB (ref <150)
Total CHOL/HDL Ratio: 6.3 RATIO
VLDL: 33 mg/dL (ref 0–40)

## 2014-08-29 LAB — HEMOGLOBIN A1C
Hgb A1c MFr Bld: 5.7 % — ABNORMAL HIGH (ref 4.8–5.6)
MEAN PLASMA GLUCOSE: 117 mg/dL

## 2014-08-29 LAB — URINE MICROSCOPIC-ADD ON

## 2014-08-29 MED ORDER — FUROSEMIDE 10 MG/ML IJ SOLN
40.0000 mg | Freq: Two times a day (BID) | INTRAMUSCULAR | Status: DC
  Administered 2014-08-29 – 2014-08-30 (×3): 40 mg via INTRAVENOUS
  Filled 2014-08-29 (×3): qty 4

## 2014-08-29 MED ORDER — FUROSEMIDE 10 MG/ML IJ SOLN: 20.0000 mg | Freq: Once | INTRAMUSCULAR | Status: DC

## 2014-08-29 MED ORDER — HEPARIN (PORCINE) IN NACL 100-0.45 UNIT/ML-% IJ SOLN
700.0000 [IU]/h | INTRAMUSCULAR | Status: DC
  Administered 2014-08-29: 700 [IU]/h via INTRAVENOUS

## 2014-08-29 MED ORDER — ONDANSETRON HCL 4 MG/2ML IJ SOLN
4.0000 mg | Freq: Four times a day (QID) | INTRAMUSCULAR | Status: DC | PRN
  Administered 2014-08-29: 4 mg via INTRAVENOUS
  Filled 2014-08-29: qty 2

## 2014-08-29 MED ORDER — PIPERACILLIN-TAZOBACTAM 3.375 G IVPB
3.3750 g | Freq: Three times a day (TID) | INTRAVENOUS | Status: DC
  Administered 2014-08-29 – 2014-08-31 (×6): 3.375 g via INTRAVENOUS
  Filled 2014-08-29 (×8): qty 50

## 2014-08-29 MED ORDER — IOHEXOL 350 MG/ML SOLN
100.0000 mL | Freq: Once | INTRAVENOUS | Status: AC | PRN
  Administered 2014-08-29: 100 mL via INTRAVENOUS

## 2014-08-29 MED ORDER — HEPARIN (PORCINE) IN NACL 100-0.45 UNIT/ML-% IJ SOLN
450.0000 [IU]/h | INTRAMUSCULAR | Status: DC
  Administered 2014-08-30: 550 [IU]/h via INTRAVENOUS
  Filled 2014-08-29: qty 250

## 2014-08-29 MED ORDER — FUROSEMIDE 10 MG/ML IJ SOLN
40.0000 mg | Freq: Once | INTRAMUSCULAR | Status: AC
  Administered 2014-08-29: 40 mg via INTRAVENOUS
  Filled 2014-08-29: qty 4

## 2014-08-29 NOTE — Care Management Note (Addendum)
Case Management Note  Patient Details  Name: [name] MRN: [id number] Date of Birth: [birth year]  Subjective/Objective:   Pt admitted for A fib RVR. Initiated on Cardizem gtt and transitioned to po Cardizem. Plan to initiate IV lasix.                  Action/Plan: No needs identified by CM at this time. CM will continue to monitor.    Expected Discharge Date:                  Expected Discharge Plan:  Home/Self Care  In-House Referral:     Discharge planning Services  CM Consult  Post Acute Care Choice:  NA Choice offered to:  NA  DME Arranged:    DME Agency:     HH Arranged:    HH Agency:     Status of Service:  In process, will continue to follow  Medicare Important Message Given:    Date Medicare IM Given:    Medicare IM give by:    Date Additional Medicare IM Given:    Additional Medicare Important Message give by:     If discussed at Long Length of Stay Meetings, dates discussed:    Additional Comments:1203 08-30-14 Tomi Bamberger, RN,BSN [phone] CM did receive referral for Co pay for apixaban (ELIQUIS) tablet 5 mg. Benefits check in process and will make pt aware once completed. Co pay should be $3.00. CM will provide pt with 30 day free card. Pt will need Rx for 30 day free no refills. Pt uses [facility]: medication is available. Pt is from home with Mother. Pt has DME RW, BSC and 02 2L via [facility]. Pt's mother takes her to appointments. CM will ask CSW to assist with Medicaid Transportation information. No further needs from CM at this time.    Gala Lewandowsky, RN 08/29/2014, 11:27 AM

## 2014-08-29 NOTE — Progress Notes (Signed)
ANTIBIOTIC CONSULT NOTE - INITIAL  Pharmacy Consult for zosyn Indication: pneumonia  Allergies  Allergen Reactions  . Cephalexin     Prefers not to have ? Psoriasis skin condition    Patient Measurements: Height:  (142.2 cm) Weight: 145 lb 4.8 oz (65.908 kg) IBW/kg (Calculated) : 36.3 Adjusted Body Weight:   Vital Signs: Temp: 97.6 F (36.4 C) (08/24 0600) Temp Source: Oral (08/24 0600) BP: 132/70 mmHg (08/24 1124) Pulse Rate: 97 (08/24 1124) Intake/Output from previous day: 08/23 0701 - 08/24 0700 In: -  Out: 300 [Urine:300] Intake/Output from this shift: Total I/O In: 240 [P.O.:240] Out: 725 [Urine:725]  Labs:  Recent Labs  08/27/14 1841 08/28/14 0530 08/29/14 0800  WBC 10.4 9.1 15.2*  HGB 13.6 13.5 14.5  PLT 282 249 299  CREATININE 0.99 0.79 0.91   Estimated Creatinine Clearance: 50.5 mL/min (by C-G formula based on Cr of 0.91). No results for input(s): VANCOTROUGH, VANCOPEAK, VANCORANDOM, GENTTROUGH, GENTPEAK, GENTRANDOM, TOBRATROUGH, TOBRAPEAK, TOBRARND, AMIKACINPEAK, AMIKACINTROU, AMIKACIN in the last 72 hours.   Microbiology: Recent Results (from the past 720 hour(s))  MRSA PCR Screening     Status: None   Collection Time: 08/27/14 11:07 PM  Result Value Ref Range Status   MRSA by PCR NEGATIVE NEGATIVE Final    Comment:        The GeneXpert MRSA Assay (FDA approved for NASAL specimens only), is one component of a comprehensive MRSA colonization surveillance program. It is not intended to diagnose MRSA infection nor to guide or monitor treatment for MRSA infections.     Medical History: Past Medical History  Diagnosis Date  . Turner's syndrome   . Aortic insufficiency     a. Mod AI by echo 2013.  Marland Kitchen Hypothyroidism   . Periorbital edema   . Hypertension   . Cervix abnormality 1993    MILD ATYPICAL ADENOMATOUS HYPERPLASIA OF CERVIX  . Dermatitis 05-26-12    all over  since 9'13  . Edema of both legs     a. right greater than left,  due to Turner's syndrome.  Marland Kitchen Speech disorder     occ. "stutter"  . Difficult intubation     will plan on spinal anesthesia"small mouth/airway"  . Abdominal aortic stenosis     a. Mild AS bye cho 2013.  Marland Kitchen Acute systolic congestive heart failure 08/29/2014    Medications:  Anti-infectives    Start     Dose/Rate Route Frequency Ordered Stop   08/29/14 1300  piperacillin-tazobactam (ZOSYN) IVPB 3.375 g     3.375 g 12.5 mL/hr over 240 Minutes Intravenous Every 8 hours 08/29/14 1231       Assessment: 58 yof admitted with SOB and palpitation. Now starting zosyn for possible aspiration pneumonia. Pt is afebrile and WBC is 15.2. No cultures available.  Zosyn 8/24>>  Goal of Therapy:  Eradication of infection  Plan:  - Zosyn 3.375gm IV Q8H (4 hr inf) - F/u renal fxn, C&S, clinical status   [name], Drake Leach 08/29/2014,2:10 PM

## 2014-08-29 NOTE — Progress Notes (Addendum)
[facility] PROGRESS NOTE  [name] ZOX:[id number] DOB: 06-17-1955 DOA: 08/27/2014 PCP: [name], MD  Brief Summary  The patient is a 59 year old female with history of Turner syndrome, mild aortic valve insufficiency. She presented to her primary care doctor's office with respiratory distress and palpitations several days prior to admission. She was found to be in A. fib with RVR.  She received 20 mg of Cardizem which caused her systolic blood pressure did drop to the 70s systolic.  She was then transported by EMS to the emergency department.  Assessment/Plan  Atrial fibrillation with rapid ventricular response, Heart rate of 130-140  Initially -  Appreciate cardiology assistance -  Telemetry demonstrates rate controlled A. fib currently on oral Cardizem -  CHA2DS2-VASc Score and unadjusted Ischemic Stroke Rate (% per year) is equal to 2.2 % stroke rate/year from a score of 2Female gender and hypertension -  transition to apixaban secondary to valvular A. Fib.  Acute hypoxic respiratory failure secondary to acute systolic heart failure probably caused by A. fib with RVR.   -  Mild LVH with ejection fraction of 45-50% and diffuse hypokinesis - Chest x-ray: Bilateral pleural effusions and vascular congestion  -  Start Lasix 40 mg IV twice a day -  Daily weights and strict ins and outs -  Appreciate cardiology assistance  Elevated D-dimer -  CT angio chest:  Bilateral pleural effusions and possible lingular pneumonia with pulmonary nodule -  Will need repeat CT chest in 6 months to eval nodule  Given nausea and vomiting, most likely aspiration pneumonia -  Start zosyn -  Afebrile so will hold on ordering blood cultures -  Send S. pneumo and legionella   Hypertension Continued home medications.  Moderate aortic insufficiency, -  Along with history of Turner syndrome, now with atrial fibrillation. -  Defer timing of cardiothoracic surgery consultation versus  interventionalist consultation to cardiology  Turner syndrome  Leukocytosis, afebrile. She has not received steroids -  Chest x-ray and urinalysis -  Repeat WBC in a.m. -  Blood cultures if she becomes febrile  Diet:  Healthy heart  Access:  PIV  IVF:  Off  Proph:  Heparin > warfarin   Code Status: full code F  amily Communication:  Patient alone Disposition Plan:  Pending improvement in shortness of breath   Consultants:   Cardiology  Procedures:   Echocardiogram  CT and geode chest to evaluate for PE and to check thoracic aorta  Antibiotics:   None   HPI/Subjective:  States that she feels achy all over. She has intermittent nausea without vomiting. She has become increasingly [place] of breath but denies wheezing. She states that her cough is better. She has been compliant with her CPAP machine at night. Normally she does not wear oxygen by day but she is currently on 3 L.    Objective: Filed Vitals:   08/29/14 0402 08/29/14 0408 08/29/14 0600 08/29/14 0803  BP: 141/60   125/71  Pulse: 67   74  Temp:   97.6 F (36.4 C)   TempSrc:   Oral   Resp:      Height:      Weight:   65.908 kg (145 lb 4.8 oz)   SpO2: 89% 91%  92%    Intake/Output Summary (Last 24 hours) at 08/29/14 1020 Last data filed at 08/29/14 0900  Gross per 24 hour  Intake    240 ml  Output    300 ml  Net    -  60 ml   Filed Weights   08/27/14 2135 08/29/14 0600  Weight: 59.92 kg (132 lb 1.6 oz) 65.908 kg (145 lb 4.8 oz)   Body mass index is 32.59 kg/(m^2).  Exam:   General:  Adult female, mild to moderate respiratory distress with tachypnea, SCM retractions, gasping intermittently   HEENT:  NCAT, MMM, webbed neck   Cardiovascular:  IRRR, nl S1, S2 no mrg, 2+ pulses, warm extremities  Respiratory:  Diminished bilateral breath sounds with wheezes, no rales, no rhonchi, no increased WOB  Abdomen:   NABS, soft, NT/ND  MSK:   Normal tone and bulk,  1+ pitting bilateral LEE Neuro:   Grossly  moves all extremities  Data Reviewed: Basic Metabolic Panel:  Recent Labs Lab 08/27/14 1841 08/28/14 0530 08/29/14 0800  NA 141 142 137  K 3.4* 3.5 4.5  CL 104 106 102  CO2 GLUCOSE 90 108* 144*  BUN 32* 27* 24*  CREATININE 0.99 0.79 0.91  CALCIUM 8.5* 8.3* 8.6*   Liver Function Tests:  Recent Labs Lab 08/29/14 0800  AST 40  ALT 78*  ALKPHOS 115  BILITOT 0.9  PROT 5.9*  ALBUMIN 3.4*   No results for input(s): LIPASE, AMYLASE in the last 168 hours. No results for input(s): AMMONIA in the last 168 hours. CBC:  Recent Labs Lab 08/27/14 1841 08/28/14 0530 08/29/14 0800  WBC 10.4 9.1 15.2*  HGB 13.6 13.5 14.5  HCT 40.5 41.4 43.6  MCV 98.1 99.3 99.3  PLT 282 249 299    Recent Results (from the past 240 hour(s))  MRSA PCR Screening     Status: None   Collection Time: 08/27/14 11:07 PM  Result Value Ref Range Status   MRSA by PCR NEGATIVE NEGATIVE Final    Comment:        The GeneXpert MRSA Assay (FDA approved for NASAL specimens only), is one component of a comprehensive MRSA colonization surveillance program. It is not intended to diagnose MRSA infection nor to guide or monitor treatment for MRSA infections.      Studies: Dg Chest Port 1 View  08/29/2014   CLINICAL DATA:  Dyspnea today.  EXAM: PORTABLE CHEST - 1 VIEW  COMPARISON:  Single view of the chest 08/27/2014. PA and lateral chest 08/15/2012.  FINDINGS: There is cardiomegaly and pulmonary edema. Small bilateral pleural effusions are seen. No pneumothorax. Basilar atelectasis noted.  IMPRESSION: Cardiomegaly and pulmonary edema with small bilateral pleural effusions.   Electronically Signed   By: Drusilla Kanner M.D.   On: 08/29/2014 10:14   Dg Chest Port 1 View  08/27/2014   CLINICAL DATA:  Shortness of breath  EXAM: PORTABLE CHEST - 1 VIEW  COMPARISON:  08/15/2012  FINDINGS: Cardiomegaly with pulmonary vascular congestion. No frank interstitial edema. Mild right basilar  atelectasis. No pleural effusion or pneumothorax.  IMPRESSION: Cardiomegaly with pulmonary vascular congestion. No frank interstitial edema.   Electronically Signed   By: Charline Bills M.D.   On: 08/27/2014 17:48    Scheduled Meds: . aspirin EC  81 mg Oral Daily  . diltiazem  90 mg Oral 4 times per day  . levothyroxine  100 mcg Oral QAC breakfast   Continuous Infusions: . heparin      Principal Problem:   Atrial fibrillation with RVR Active Problems:   Benign hypertensive heart disease without heart failure   Turner syndrome   Aortic insufficiency   Hypertension   New onset a-fib    Time spent: 73  min    Shomari Scicchitano, [facility]  [facility] Pager [phone]. If 7PM-7AM, please contact night-coverage at [email or web address], password [facility] 08/29/2014, 10:20 AM  LOS: 2 days

## 2014-08-29 NOTE — Progress Notes (Signed)
ANTICOAGULATION CONSULT NOTE - FOLLOW UP    HL = 0.88 (goal 0.3 - 0.7 units/mL) Heparin dosing weight = 60 kg   Assessment: 58 YOF with Afib to continue on IV heparin.  Heparin level remains supra-therapeutic despite rate adjustment this AM.  No bleeding reported and lab is drawn appropriately per RN.   Plan: - Decrease heparin gtt to 550 units/hr - Check 6 hr HL    Jahkeem Kurka D. Laney Potash, PharmD, BCPS 08/29/2014, 7:45 PM

## 2014-08-29 NOTE — Progress Notes (Signed)
Called to patients room due to patient desating in the 80's. After checking the cpap patient was wearing I noticed her oxygen connection was not connected and allowing the oxygen to flow through the mask.  This was connected earlier when I placed patient on Cpap.  Spoke with a Charity fundraiser at the desk advised him what the issue was he said he would relay the message to the patients RN.  Pt sats came up in the mid 90's no distress noted.  Pt resting well.

## 2014-08-29 NOTE — Telephone Encounter (Signed)
Thanks

## 2014-08-29 NOTE — Progress Notes (Signed)
ANTICOAGULATION CONSULT NOTE - Follow-up  Pharmacy Consult for heparin Indication: atrial fibrillation  Allergies  Allergen Reactions  . Cephalexin     Prefers not to have ? Psoriasis skin condition    Patient Measurements: Height:  (142.2 cm) Weight: 145 lb 4.8 oz (65.908 kg) IBW/kg (Calculated) : 36.3  Vital Signs: Temp: 97.6 F (36.4 C) (08/24 0600) Temp Source: Oral (08/24 0600) BP: 125/71 mmHg (08/24 0803) Pulse Rate: 74 (08/24 0803)  Labs:  Recent Labs  08/27/14 1841 08/28/14 0530 08/28/14 0935 08/28/14 1505 08/28/14 2203 08/29/14 0800  HGB 13.6 13.5  --   --   --  14.5  HCT 40.5 41.4  --   --   --  43.6  PLT 282 249  --   --   --  299  APTT 25  --   --   --   --   --   LABPROT 15.2  --   --   --   --   --   INR 1.18  --   --   --   --   --   HEPARINUNFRC  --   --   --   --  0.87* 1.10*  CREATININE 0.99 0.79  --   --   --  0.91  TROPONINI 0.05*  --  0.04* 0.04* 0.04*  --     Estimated Creatinine Clearance: 50.5 mL/min (by C-G formula based on Cr of 0.91).  Medications:  Infusions:  . heparin     Assessment: 58 yof presented to the hospital with SOB and palpitation. Found to be in afib and started on heparin IV for anticoagulation. She was not on anticoagulation PTA. CBC and INR are WNL. Heparin level is elevated at 1.1. No bleeding noted.   Goal of Therapy:  Heparin level 0.3-0.7 units/ml Monitor platelets by anticoagulation protocol: Yes   Plan:  - Hold heparin x 1 hour then resume at a decreased rate of 700 units/hr - Check an 8 hour heparin level - Daily heparin level and CBC  Cherrie Franca, Drake Leach 08/29/2014,9:41 AM

## 2014-08-29 NOTE — Progress Notes (Signed)
Went by patients room to place her on Cpap.  Pt and family member said she has a midnight med to take.  Advised I will be back and place pt on after med given.

## 2014-08-29 NOTE — Progress Notes (Signed)
Subjective: Some nausea today  Objective: Vital signs in last 24 hours: Temp:  [97.6 F (36.4 C)-98.6 F (37 C)] 97.6 F (36.4 C) (08/24 0600) Pulse Rate:  [62-97] 97 (08/24 1124) BP: (110-147)/(60-91) 132/70 mmHg (08/24 1124) SpO2:  [89 %-96 %] 91 % (08/24 1124) Weight:  [145 lb 4.8 oz (65.908 kg)] 145 lb 4.8 oz (65.908 kg) (08/24 0600) Last BM Date: 08/26/14  Intake/Output from previous day: 08/23 0701 - 08/24 0700 In: -  Out: 300 [Urine:300] Intake/Output this shift: Total I/O In: 240 [P.O.:240] Out: 725 [Urine:725]  Medications Scheduled Meds: . aspirin EC  81 mg Oral Daily  . diltiazem  90 mg Oral 4 times per day  . furosemide  40 mg Intravenous BID  . levothyroxine  100 mcg Oral QAC breakfast  . piperacillin-tazobactam (ZOSYN)  IV  3.375 g Intravenous Q8H   Continuous Infusions: . heparin 700 Units/hr (08/29/14 1116)   PRN Meds:.ondansetron (ZOFRAN) IV  PE: General appearance: alert, cooperative sleeping when I entered.  Lungs: Decreased BS Heart: irregularly irregular rhythm and No MRG Abdomen: +BS, nontender Extremities: No Pitting LEE Pulses: 2+ and symmetric Skin: Warm and dry Neurologic: Grossly normal  Lab Results:   Recent Labs  08/27/14 1841 08/28/14 0530 08/29/14 0800  WBC 10.4 9.1 15.2*  HGB 13.6 13.5 14.5  HCT 40.5 41.4 43.6  PLT 282 249 299   BMET  Recent Labs  08/27/14 1841 08/28/14 0530 08/29/14 0800  NA 141 142 137  K 3.4* 3.5 4.5  CL 104 106 102  CO2 26 27 25   GLUCOSE 90 108* 144*  BUN 32* 27* 24*  CREATININE 0.99 0.79 0.91  CALCIUM 8.5* 8.3* 8.6*   PT/INR  Recent Labs  08/27/14 1841  LABPROT 15.2  INR 1.18   Cholesterol  Recent Labs  08/29/14 0800  CHOL 163   Lipid Panel     Component Value Date/Time   CHOL 163 08/29/2014 0800   TRIG 163* 08/29/2014 0800   HDL 26* 08/29/2014 0800   CHOLHDL 6.3 08/29/2014 0800   VLDL 33 08/29/2014 0800   LDLCALC 104* 08/29/2014 0800    Assessment/Plan  59  y/o F with history of Turner's syndrome, 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    Atrial fibrillation with RVR Still in afib.  For the most part her rate is well controlled.  Seems a little elevated this morning.  Related to PNA seen on CT?  WBCs elevated. On PO cardidizem 90mg  Q6.  IV heparin.   Flat troponin at 0.04.  EF 45-50% with diffuse hypokinesis.  Moderate AI. The LA is severely dilated. Peak PA pressure .  Rapid rate during echo made interpretation more difficult.  The plan is to start Eliquis.  Planning DCCV after 3 weeks of anticoagulation.     Turner syndrome   Aortic insufficiency, moderate   Hypertension  Stable.    OSA (obstructive sleep apnea)  On CPAP   Acute respiratory failure with hypoxia   Acute systolic congestive heart failure Net fluids: -0.3L/-0.8L.  Cardiomegaly and pulmonary edema with small bilateral pleural effusions.   Leukocytosis    LOS: 2 days    HAGER, BRYAN PA-C 08/29/2014 1:23 PM  I have seen and examined the patient along with HAGER,  BRYAN PA-C.  I have reviewed the chart, notes and new data.  I agree with PA's note.  Key new complaints: sleeping comfortably Key examination changes: irregular rhythm, better rate control (80-85), diastolic murmur Key new findings / data: will plan another echo to reevaluate AI  PLAN: Continue rate control until 43 weeks of uninterrupted anticoagulation before attempt at cardioversion. Might be difficult to maintain NSR due to LA size. Will have to decide on long-term rate control vs rhythm control based on symptoms.  Thurmon Fair, MD, [facility] [phone] 08/29/2014, 2:50 PM

## 2014-08-30 DIAGNOSIS — R590 Localized enlarged lymph nodes: Secondary | ICD-10-CM

## 2014-08-30 DIAGNOSIS — R911 Solitary pulmonary nodule: Secondary | ICD-10-CM

## 2014-08-30 DIAGNOSIS — J690 Pneumonitis due to inhalation of food and vomit: Secondary | ICD-10-CM

## 2014-08-30 HISTORY — DX: Localized enlarged lymph nodes: R59.0

## 2014-08-30 LAB — CBC
HEMATOCRIT: 41.7 % (ref 36.0–46.0)
Hemoglobin: 14 g/dL (ref 12.0–15.0)
MCH: 32.9 pg (ref 26.0–34.0)
MCHC: 33.6 g/dL (ref 30.0–36.0)
MCV: 98.1 fL (ref 78.0–100.0)
PLATELETS: 249 10*3/uL (ref 150–400)
RBC: 4.25 MIL/uL (ref 3.87–5.11)
RDW: 15.4 % (ref 11.5–15.5)
WBC: 13.2 10*3/uL — ABNORMAL HIGH (ref 4.0–10.5)

## 2014-08-30 LAB — BASIC METABOLIC PANEL
Anion gap: 10 (ref 5–15)
BUN: 13 mg/dL (ref 6–20)
CHLORIDE: 96 mmol/L — AB (ref 101–111)
CO2: 34 mmol/L — AB (ref 22–32)
CREATININE: 0.73 mg/dL (ref 0.44–1.00)
Calcium: 8.5 mg/dL — ABNORMAL LOW (ref 8.9–10.3)
GFR calc Af Amer: >60 mL/min (ref >60)
GFR calc non Af Amer: >60 mL/min (ref >60)
GLUCOSE: 126 mg/dL — AB (ref 65–99)
POTASSIUM: 3.2 mmol/L — AB (ref 3.5–5.1)
SODIUM: 140 mmol/L (ref 135–145)

## 2014-08-30 LAB — HEPARIN LEVEL (UNFRACTIONATED): Heparin Unfractionated: 0.76 IU/mL — ABNORMAL HIGH (ref 0.30–0.70)

## 2014-08-30 LAB — STREP PNEUMONIAE URINARY ANTIGEN: Strep Pneumo Urinary Antigen: NEGATIVE

## 2014-08-30 MED ORDER — APIXABAN 5 MG PO TABS
5.0000 mg | ORAL_TABLET | Freq: Two times a day (BID) | ORAL | Status: DC
  Administered 2014-08-30 – 2014-09-04 (×11): 5 mg via ORAL
  Filled 2014-08-30 (×3): qty 1
  Filled 2014-08-30: qty 2
  Filled 2014-08-30 (×7): qty 1

## 2014-08-30 MED ORDER — DILTIAZEM HCL ER COATED BEADS 120 MG PO TB24
360.0000 mg | ORAL_TABLET | Freq: Every day | ORAL | Status: DC
  Administered 2014-08-30 – 2014-09-02 (×4): 360 mg via ORAL
  Filled 2014-08-30 (×10): qty 3

## 2014-08-30 MED ORDER — POTASSIUM CHLORIDE CRYS ER 20 MEQ PO TBCR
40.0000 meq | EXTENDED_RELEASE_TABLET | Freq: Once | ORAL | Status: AC
  Administered 2014-08-30: 40 meq via ORAL
  Filled 2014-08-30: qty 2

## 2014-08-30 MED ORDER — OFF THE BEAT BOOK
Freq: Once | Status: AC
  Administered 2014-08-30: 13:00:00
  Filled 2014-08-30: qty 1

## 2014-08-30 MED ORDER — POTASSIUM CHLORIDE CRYS ER 20 MEQ PO TBCR: 40.0000 meq | EXTENDED_RELEASE_TABLET | Freq: Every day | ORAL | Status: DC

## 2014-08-30 MED ORDER — LEVOTHYROXINE SODIUM 88 MCG PO TABS
88.0000 ug | ORAL_TABLET | Freq: Every day | ORAL | Status: DC
  Administered 2014-08-31 – 2014-09-04 (×5): 88 ug via ORAL
  Filled 2014-08-30 (×5): qty 1

## 2014-08-30 NOTE — Progress Notes (Signed)
ANTICOAGULATION CONSULT NOTE - Follow-up  Pharmacy Consult for heparin ->apixaban Indication: atrial fibrillation  Allergies  Allergen Reactions  . Cephalexin     Prefers not to have ? Psoriasis skin condition    Patient Measurements: Height:  (142.2 cm) Weight: 141 lb 6.4 oz (64.139 kg) IBW/kg (Calculated) : 36.3  Vital Signs: Temp: 97.9 F (36.6 C) (08/25 0546) Temp Source: Oral (08/25 0546) BP: 131/71 mmHg (08/25 0546) Pulse Rate: 104 (08/25 0546)  Labs:  Recent Labs  08/27/14 1841 08/28/14 0530 08/28/14 0935 08/28/14 1505  08/28/14 2203 08/29/14 0800 08/29/14 1920 08/30/14 0240  HGB 13.6 13.5  --   --   --   --  14.5  --  14.0  HCT 40.5 41.4  --   --   --   --  43.6  --  41.7  PLT 282 249  --   --   --   --  299  --  249  APTT 25  --   --   --   --   --   --   --   --   LABPROT 15.2  --   --   --   --   --   --   --   --   INR 1.18  --   --   --   --   --   --   --   --   HEPARINUNFRC  --   --   --   --   < > 0.87* 1.10* 0.88* 0.76*  CREATININE 0.99 0.79  --   --   --   --  0.91  --  0.73  TROPONINI 0.05*  --  0.04* 0.04*  --  0.04*  --   --   --   < > = values in this interval not displayed.  Estimated Creatinine Clearance: 56.7 mL/min (by C-G formula based on Cr of 0.73).  Assessment: 59 yof on heparin for afib. Now transitioning to apixaban. Age 59, wt 64 kg, SCr 0.72 so qualifies for standard dose of  po BID. CBC stable.  Goal of Therapy:  Prevention of CVA Monitor platelets by anticoagulation protocol: Yes   Plan:  - D/c heparin when first dose of apixaban given - D/c heparin levels - Apixaban  po BID  Christoper Fabian, PharmD, BCPS Clinical pharmacist, pager [phone] 08/30/2014,7:23 AM

## 2014-08-30 NOTE — Progress Notes (Signed)
Patient's family brought home cpap mask in. Mask is compatible with our cpap machine, pt tolerating well. RT will continue to monitor.

## 2014-08-30 NOTE — Progress Notes (Addendum)
[facility] PROGRESS NOTE  [name] WJX:[id number] DOB: 1955-07-02 DOA: 08/27/2014 PCP: [name], MD  Brief Summary  The patient is a 59 year old female with history of Turner syndrome, mild aortic valve insufficiency. She presented to her primary care doctor's office with respiratory distress and palpitations several days prior to admission. She was found to be in A. fib with RVR.  She received 20 mg of Cardizem which caused her systolic blood pressure did drop to the 70s systolic.  She was then transported by EMS to the emergency department.  8/24:  CT angio chest:  Lingular pneumonia, started zosyn.  Incidental pulm nodule.  Transitioned off dilt gtt  Assessment/Plan  Atrial fibrillation with rapid ventricular response, Heart rate of 130-140 initially.  CHA2DS2-VASc Score and unadjusted Ischemic Stroke Rate (% per year) is equal to 2.2% stroke rate/year from a score of 2, female gender and hypertension -  Appreciate cardiology assistance -  Telemetry:  A-fib rate in 100s -  Will transition to long acting diltiazem 360mg   -  continue apixaban -  Will d/c daily aspirin  -  Cardiology considering TEE with cardioversion in AM  Moderate aortic insufficiency with acute systolic heart failure -  Initially concern for bicuspid AV, however, ECHO demonstrated tricuspid valve -  Possible TEE to better visual AV -  No evidence of aneurysmal dilation of thoracic aorta on CT -  Defer timing of cardiothoracic surgery consultation versus interventionalist consultation to cardiology -  Appreciate cardiology assistance  Acute hypoxic respiratory failure secondary to acute systolic heart failure -  Mild LVH with ejection fraction of 45-50% and diffuse hypokinesis -  Chest x-ray: Bilateral pleural effusions and vascular congestion  -  Continue Lasix 40 mg IV twice a day -  Wt down almost 2kg -  -2.3L yesterday  Given nausea and vomiting, most likely aspiration pneumonia -   Continue zosyn day 2 -  S. pneumo neg and legionella pending  Elevated D-dimer -  CT angio chest:  Bilateral pleural effusions and possible lingular pneumonia with pulmonary nodule but no PE  Pulmonary nodule and mild mediastinal lymphadenopathy probably secondary to edema and pneumonia  -  Will need repeat CT chest in 6 months  Hypertension, BP stable.   -  Transition to oral dilt  Turner syndrome, with some typical features such as webbed neck, Shamarion Coots stature, shield chest, but she does not appear to have a bicuspid aortic valve  Hypothyroidism, TSH 0.370 borderline low and free T4 mildly elevated at 1.43 -  Decrease to synthroid -  Repeat TFTs in 4 weeks  Leukocytosis, afebrile. She has not received steroids.  Trending down -  CT concerning for pneumonia -  Urinalysis with large LE and 21-50 WBC but only rare bacteria -  Add on urine culture  Hypokalemia due to diuresis -  Start standing oral potassium supplementation  Diet:  Healthy heart  Access:  PIV  IVF:  Off  Proph:  apixaban  Code Status: full code Family Communication:  Patient and her mother.  Her mother voiced several concerns.  The cpap mask does not fit well.  Second, she was left too long on the bedpan overnight.  Third, her O2 sat dropped frequently overnight causing the alarm to go off so neither mom or patient got good rest.   Disposition Plan:  Pending improvement in shortness of breath with abx and ongoing diuresis, possible cardioversion   Consultants:   Cardiology, Dr. Royann Shivers  Procedures:  Echocardiogram  CT  angio chest  Antibiotics:   None   HPI/Subjective:  States her breathing is better today and has been voiding frequently.  No recent BM recorded.  Eating well and denies nausea.  CPAP mask does not fit well.     Objective: Filed Vitals:   08/29/14 2351 08/30/14 0018 08/30/14 0546 08/30/14 0557  BP: 101/68  131/71   Pulse: 104 93 104   Temp:   97.9 F (36.6 C)   TempSrc:    Oral   Resp:  20 20   Height:      Weight:    64.139 kg (141 lb 6.4 oz)  SpO2:   94%     Intake/Output Summary (Last 24 hours) at 08/30/14 1403 Last data filed at 08/30/14 1300  Gross per 24 hour  Intake 846.77 ml  Output   3460 ml  Net -2613.23 ml   Filed Weights   08/27/14 2135 08/29/14 0600 08/30/14 0557  Weight: 59.92 kg (132 lb 1.6 oz) 65.908 kg (145 lb 4.8 oz) 64.139 kg (141 lb 6.4 oz)   Body mass index is 31.72 kg/(m^2).  Exam:   General:  Adult female, mild respiratory distress with SCM retractions but no grunting today    HEENT:  NCAT, MMM, webbed neck   Cardiovascular:  IRRR, nl S1, S2 no mrg, 2+ pulses, warm extremities  Respiratory:  Diminished bilateral breath sounds with rales at the bilateral bases, no wheezes or rhonchi.    Abdomen:   NABS, soft, NT/ND  MSK:   Normal tone and bulk  Neuro:  Grossly moves all extremities  Data Reviewed: Basic Metabolic Panel:  Recent Labs Lab 08/27/14 1841 08/28/14 0530 08/29/14 0800 08/30/14 0240  NA 141 142 137 140  K 3.4* 3.5 4.5 3.2*  CL 104 106 102 96*  CO2 26 27 25  34*  GLUCOSE 90 108* 144* 126*  BUN 32* 27* 24* 13  CREATININE 0.99 0.79 0.91 0.73  CALCIUM 8.5* 8.3* 8.6* 8.5*   Liver Function Tests:  Recent Labs Lab 08/29/14 0800  AST 40  ALT 78*  ALKPHOS 115  BILITOT 0.9  PROT 5.9*  ALBUMIN 3.4*   No results for input(s): LIPASE, AMYLASE in the last 168 hours. No results for input(s): AMMONIA in the last 168 hours. CBC:  Recent Labs Lab 08/27/14 1841 08/28/14 0530 08/29/14 0800 08/30/14 0240  WBC 10.4 9.1 15.2* 13.2*  HGB 13.6 13.5 14.5 14.0  HCT 40.5 41.4 43.6 41.7  MCV 98.1 99.3 99.3 98.1  PLT 282 249 299 249    Recent Results (from the past 240 hour(s))  MRSA PCR Screening     Status: None   Collection Time: 08/27/14 11:07 PM  Result Value Ref Range Status   MRSA by PCR NEGATIVE NEGATIVE Final    Comment:        The GeneXpert MRSA Assay (FDA approved for NASAL  specimens only), is one component of a comprehensive MRSA colonization surveillance program. It is not intended to diagnose MRSA infection nor to guide or monitor treatment for MRSA infections.      Studies: Ct Angio Chest Pe W/cm &/or Wo Cm  08/29/2014   CLINICAL DATA:  Elevated D-dimer.  Extreme shortness of breath.  EXAM: CT ANGIOGRAPHY CHEST WITH CONTRAST  TECHNIQUE: Multidetector CT imaging of the chest was performed using the standard protocol during bolus administration of intravenous contrast. Multiplanar CT image reconstructions and MIPs were obtained to evaluate the vascular anatomy.  CONTRAST:  OMNIPAQUE IOHEXOL 350 MG/ML SOLN  COMPARISON:  Chest x-ray 08/29/2014  FINDINGS: Aberrant venous anatomy. The left innominate vein is not visualized. 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 Good opacification of pulmonary arteries. No filling defects in the pulmonary arteries to suggest pulmonary emboli. The aorta is un opacified. Tortuosity of the aorta with calcifications in the aortic arch and descending thoracic aorta. No evidence of aortic aneurysm. Maximum diameter 3.6 cm in the proximal descending thoracic aorta. The aorta is not opacified and. Therefore, cannot evaluate for dissection.  There are enlarged mediastinal lymph nodes. AP window lymph node has a Nonna Renninger axis diameter of 19 mm. Prevascular node has a Sabine Tenenbaum axis diameter of 11 mm. Right peritracheal node has a Braelee Herrle axis diameter of 13 mm. No hilar or axillary adenopathy.  There is cardiomegaly. There are small bilateral pleural effusions. Vascular congestion noted. There is dependent opacities in the lower lobes, likely compressive atelectasis. Airspace opacity within the lingula is concerning for pneumonia. Also within the lingula peripherally is a 7 mm nodule on image 70. This may be inflammatory but warrants followup.  Chest wall  soft tissues are unremarkable. Imaging into the upper abdomen shows no acute findings.  Review of the MIP images confirms the above findings.  IMPRESSION: No evidence of pulmonary embolus. No evidence of aortic aneurysm. The aorta is un opacified and therefore dissection cannot be completely excluded.  Small bilateral pleural effusions with compressive atelectasis in the lower lobes. Area of consolidation in the lingula is concerning for pneumonia.  7 mm lingular nodule which could be inflammatory but warrants followup. If the patient is at high risk for bronchogenic carcinoma, follow-up chest CT at 3-73months is recommended. If the patient is at low risk for bronchogenic carcinoma, follow-up chest CT at 6-12 months is recommended. This recommendation follows the consensus statement: Guidelines for Management of Small Pulmonary Nodules Detected on CT Scans: A Statement from the Fleischner Society as published in Radiology 2005; 237:395-400.  Cardiomegaly, vascular congestion.  Mild mediastinal adenopathy. Recommend attention on followup imaging.   Electronically Signed   By: Charlett Nose M.D.   On: 08/29/2014 11:18   Dg Chest Port 1 View  08/29/2014   CLINICAL DATA:  Dyspnea today.  EXAM: PORTABLE CHEST - 1 VIEW  COMPARISON:  Single view of the chest 08/27/2014. PA and lateral chest 08/15/2012.  FINDINGS: There is cardiomegaly and pulmonary edema. Small bilateral pleural effusions are seen. No pneumothorax. Basilar atelectasis noted.  IMPRESSION: Cardiomegaly and pulmonary edema with small bilateral pleural effusions.   Electronically Signed   By: Drusilla Kanner M.D.   On: 08/29/2014 10:14    Scheduled Meds: . apixaban  5 mg Oral BID  . aspirin EC  81 mg Oral Daily  . diltiazem  360 mg Oral Daily  . furosemide  40 mg Intravenous BID  . levothyroxine  100 mcg Oral QAC breakfast  . piperacillin-tazobactam (ZOSYN)  IV  3.375 g Intravenous Q8H  . [START ON 08/31/2014] potassium chloride  40 mEq Oral Daily    Continuous Infusions:    Principal Problem:   Atrial fibrillation with RVR Active Problems:   Benign hypertensive heart disease without heart failure   Turner syndrome   Aortic insufficiency   Hypertension   OSA (obstructive sleep apnea)   New onset a-fib   Acute respiratory failure with hypoxia   Acute systolic congestive heart failure   Leukocytosis    Time spent: 30 min  Renae Fickle  [facility] Pager [phone]. If 7PM-7AM, please contact night-coverage at [email or web address], password [facility] 08/30/2014, 2:03 PM  LOS: 3 days

## 2014-08-30 NOTE — Progress Notes (Signed)
ANTICOAGULATION CONSULT NOTE - FOLLOW UP    HL = 0.76 (goal 0.3 - 0.7 units/mL) Heparin dosing weight = 60 kg   Assessment: 58 YOF with Afib continues on IV heparin.  Heparin level remains supra-therapeutic despite rate adjustments.  No bleeding reported.   Plan: - Decrease heparin gtt to 450 units/hr - Check 6 hr HL  CaronChristoper FabianPharmD, BCPS Clinical pharmacist, pager [phone] 08/30/2014, 3:53 AM

## 2014-08-30 NOTE — Progress Notes (Signed)
Patient potassium 3.2 this morning.  Text page sent to [facility] with this information.

## 2014-08-30 NOTE — Progress Notes (Signed)
Patient Name: [name] Date of Encounter: 08/30/2014   SUBJECTIVE  Feels better. Denies chest pain, sob and palpitation.   CURRENT MEDS . apixaban  5 mg Oral BID  . aspirin EC  81 mg Oral Daily  . diltiazem  90 mg Oral 4 times per day  . furosemide  40 mg Intravenous BID  . levothyroxine  100 mcg Oral QAC breakfast  . piperacillin-tazobactam (ZOSYN)  IV  3.375 g Intravenous Q8H  . [START ON 08/31/2014] potassium chloride  40 mEq Oral Daily    OBJECTIVE  Filed Vitals:   08/29/14 2351 08/30/14 0018 08/30/14 0546 08/30/14 0557  BP: 101/68  131/71   Pulse: 104 93 104   Temp:   97.9 F (36.6 C)   TempSrc:   Oral   Resp:  20 20   Height:      Weight:    141 lb 6.4 oz (64.139 kg)  SpO2:   94%     Intake/Output Summary (Last 24 hours) at 08/30/14 0856 Last data filed at 08/30/14 0843  Gross per 24 hour  Intake 846.77 ml  Output   3110 ml  Net -2263.23 ml   Filed Weights   08/27/14 2135 08/29/14 0600 08/30/14 0557  Weight: 132 lb 1.6 oz (59.92 kg) 145 lb 4.8 oz (65.908 kg) 141 lb 6.4 oz (64.139 kg)    PHYSICAL EXAM  General: Pleasant, NAD. Neuro: Alert and oriented X 3. Moves all extremities spontaneously. Psych: Normal affect. HEENT:  Normal  Neck: Supple without bruits or JVD. Lungs:  Resp regular and unlabored, CTA. Heart: Irregularly irregular rhythm.. Diastolic murmurs. Abdomen: Soft, non-tender, non-distended, BS + x 4.  Extremities: No clubbing, cyanosis.  DP/PT/Radials 2+ and equal bilaterally. No pitting edema, however puffy legs.   Accessory Clinical Findings  CBC  Recent Labs  08/29/14 0800 08/30/14 0240  WBC 15.2* 13.2*  HGB 14.5 14.0  HCT 43.6 41.7  MCV 99.3 98.1  PLT 299 249   Basic Metabolic Panel  Recent Labs  08/29/14 0800 08/30/14 0240  NA 137 140  K 4.5 3.2*  CL 102 96*  CO2 25 34*  GLUCOSE 144* 126*  BUN 24* 13  CREATININE 0.91 0.73  CALCIUM 8.6* 8.5*   Liver Function Tests  Recent Labs  08/29/14 0800  AST  40  ALT 78*  ALKPHOS 115  BILITOT 0.9  PROT 5.9*  ALBUMIN 3.4*   Cardiac Enzymes  Recent Labs  08/28/14 0935 08/28/14 1505 08/28/14 2203  TROPONINI 0.04* 0.04* 0.04*   BNP Invalid input(s): POCBNP D-Dimer  Recent Labs  08/28/14 1505  DDIMER 3.14*   Hemoglobin A1C  Recent Labs  08/28/14 1505  HGBA1C 5.7*   Fasting Lipid Panel  Recent Labs  08/29/14 0800  CHOL 163  HDL 26*  LDLCALC 104*  TRIG 163*  CHOLHDL 6.3   Thyroid Function Tests  Recent Labs  08/28/14 1505  TSH 0.370    TELE  afib at rate of 80-90s. At times elevates to 120.  Radiology/Studies  Ct Angio Chest Pe W/cm &/or Wo Cm  08/29/2014   CLINICAL DATA:  Elevated D-dimer.  Extreme shortness of breath.  EXAM: CT ANGIOGRAPHY CHEST WITH CONTRAST  TECHNIQUE: Multidetector CT imaging of the chest was performed using the standard protocol during bolus administration of intravenous contrast. Multiplanar CT image reconstructions and MIPs were obtained to evaluate the vascular anatomy.  CONTRAST:  OMNIPAQUE IOHEXOL 350 MG/ML SOLN  COMPARISON:  Chest x-ray 08/29/2014  FINDINGS: Aberrant  venous anatomy. The left innominate vein is not visualized. 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 Good opacification of pulmonary arteries. No filling defects in the pulmonary arteries to suggest pulmonary emboli. The aorta is un opacified. Tortuosity of the aorta with calcifications in the aortic arch and descending thoracic aorta. No evidence of aortic aneurysm. Maximum diameter 3.6 cm in the proximal descending thoracic aorta. The aorta is not opacified and. Therefore, cannot evaluate for dissection.  There are enlarged mediastinal lymph nodes. AP window lymph node has a short axis diameter of 19 mm. Prevascular node has a short axis diameter of 11 mm. Right peritracheal node has a short axis diameter of 13 mm. No hilar or  axillary adenopathy.  There is cardiomegaly. There are small bilateral pleural effusions. Vascular congestion noted. There is dependent opacities in the lower lobes, likely compressive atelectasis. Airspace opacity within the lingula is concerning for pneumonia. Also within the lingula peripherally is a 7 mm nodule on image 70. This may be inflammatory but warrants followup.  Chest wall soft tissues are unremarkable. Imaging into the upper abdomen shows no acute findings.  Review of the MIP images confirms the above findings.  IMPRESSION: No evidence of pulmonary embolus. No evidence of aortic aneurysm. The aorta is un opacified and therefore dissection cannot be completely excluded.  Small bilateral pleural effusions with compressive atelectasis in the lower lobes. Area of consolidation in the lingula is concerning for pneumonia.  7 mm lingular nodule which could be inflammatory but warrants followup. If the patient is at high risk for bronchogenic carcinoma, follow-up chest CT at 3-14months is recommended. If the patient is at low risk for bronchogenic carcinoma, follow-up chest CT at 6-12 months is recommended. This recommendation follows the consensus statement: Guidelines for Management of Small Pulmonary Nodules Detected on CT Scans: A Statement from the Fleischner Society as published in Radiology 2005; 237:395-400.  Cardiomegaly, vascular congestion.  Mild mediastinal adenopathy. Recommend attention on followup imaging.   Electronically Signed   By: Charlett Nose M.D.   On: 08/29/2014 11:18   Dg Chest Port 1 View  08/29/2014   CLINICAL DATA:  Dyspnea today.  EXAM: PORTABLE CHEST - 1 VIEW  COMPARISON:  Single view of the chest 08/27/2014. PA and lateral chest 08/15/2012.  FINDINGS: There is cardiomegaly and pulmonary edema. Small bilateral pleural effusions are seen. No pneumothorax. Basilar atelectasis noted.  IMPRESSION: Cardiomegaly and pulmonary edema with small bilateral pleural effusions.    Electronically Signed   By: Drusilla Kanner M.D.   On: 08/29/2014 10:14   Dg Chest Port 1 View  08/27/2014   CLINICAL DATA:  Shortness of breath  EXAM: PORTABLE CHEST - 1 VIEW  COMPARISON:  08/15/2012  FINDINGS: Cardiomegaly with pulmonary vascular congestion. No frank interstitial edema. Mild right basilar atelectasis. No pleural effusion or pneumothorax.  IMPRESSION: Cardiomegaly with pulmonary vascular congestion. No frank interstitial edema.   Electronically Signed   By: Charline Bills M.D.   On: 08/27/2014 17:48    ASSESSMENT AND PLAN  59 y/o F with history of Turner's syndrome, chronic lifelong edema 2/2 Turner's syndrome, hypothyroidism, HTN, OA, OSA (reports compliance w/ CPAP), mod AI/mild AS and grade 2 DD by echo 2013 (EF 55-60%) who presented to [facility] with SOB x 1 week and was found to have new onset atrial fibrillation.  1. Atrial fibrillation with RVR - Rate relatively controlled (80-90s, at times in 120s)  on PO cardidizem 90mg  Q6. Consider switching to long acting.  - Flat troponin at 0.04. - EF 45-50% with diffuse hypokinesis. Moderate AI. The LA is severely dilated. Peak PA pressure . Rapid rate during echo made interpretation more difficult. Repeat echo once rate is stable. Needs to decide on long-term rate control vs rhythm control based on symptoms. DCCV after 3-4  weeks of anticoagulation. - Transition from heparin --> Apixaban per pharmacy (first dose given today)  2. Aortic insufficiency, moderate - as above   3. Hypertension - stable.    4. OSA (obstructive sleep apnea) - On CPAP  5. Acute systolic congestive heart failur - Net fluids: -2.2L/-3L. Cardiomegaly and pulmonary edema with small bilateral pleural effusions. 4lb weight loss in 24 hours. Continue IV lasix 40mg  BIB, transition to PO tomorrow.   6. Hypokalemia - K of 3.2 today. On daily supplement now.    Leukocytosis   Turner syndrome  Acute respiratory failure with  hypoxia  Signed, Bhagat,Bhavinkumar PA-C Pager [phone]  I have seen and examined the patient along with Bhagat,Bhavinkumar PA-C.  I have reviewed the chart, notes and new data.  I agree with PA's note.  Key new complaints: she feels better, but still appears a little tachypneic at rest Key examination changes: decrescendo diastolic murmur at LLSB 1-2/6 Key new findings / data: reviewed her echo. The aortic valve appears to be trileaflet, but there is at least moderate AI. LVEF is low and LV is somewhat spherically remodeled  PLAN: Still with mild RVR. Increase rate control meds. It is not clear whether her cardiomyopathy is tachycardia related or due to chronic AI.  TEE would be valuable in assessing the AV morphology and better quantifying the AI. It might also be an opportunity for cardioversion, if there is no left atrial thrombus. By tomorrow, she should have received 3 doses of apixaban. The patient's mother is not currently in the room to discuss these procedures. Will check back later today.  Thurmon Fair, MD, [facility] [phone] 08/30/2014, 11:27 AM

## 2014-08-31 ENCOUNTER — Inpatient Hospital Stay (HOSPITAL_COMMUNITY): Payer: Medicaid Other

## 2014-08-31 ENCOUNTER — Encounter (HOSPITAL_COMMUNITY)
Admission: EM | Disposition: A | Discharge status: Home / Self Care | Payer: Self-pay | Source: Home / Self Care | Attending: Internal Medicine

## 2014-08-31 DIAGNOSIS — I5021 Acute systolic (congestive) heart failure: Secondary | ICD-10-CM

## 2014-08-31 DIAGNOSIS — D72829 Elevated white blood cell count, unspecified: Secondary | ICD-10-CM

## 2014-08-31 DIAGNOSIS — R0600 Dyspnea, unspecified: Secondary | ICD-10-CM | POA: Insufficient documentation

## 2014-08-31 LAB — BASIC METABOLIC PANEL
ANION GAP: 8 (ref 5–15)
Anion gap: 7 (ref 5–15)
BUN: 11 mg/dL (ref 6–20)
BUN: 16 mg/dL (ref 6–20)
CALCIUM: 8.4 mg/dL — AB (ref 8.9–10.3)
CHLORIDE: 91 mmol/L — AB (ref 101–111)
CHLORIDE: 94 mmol/L — AB (ref 101–111)
CO2: 38 mmol/L — AB (ref 22–32)
CO2: 37 mmol/L — AB (ref 22–32)
CREATININE: 0.71 mg/dL (ref 0.44–1.00)
CREATININE: 0.78 mg/dL (ref 0.44–1.00)
Calcium: 8.8 mg/dL — ABNORMAL LOW (ref 8.9–10.3)
GFR calc Af Amer: >60 mL/min (ref >60)
GFR calc non Af Amer: >60 mL/min (ref >60)
GFR calc non Af Amer: >60 mL/min (ref >60)
GLUCOSE: 138 mg/dL — AB (ref 65–99)
Glucose, Bld: 114 mg/dL — ABNORMAL HIGH (ref 65–99)
POTASSIUM: 4.1 mmol/L (ref 3.5–5.1)
Potassium: 2.8 mmol/L — ABNORMAL LOW (ref 3.5–5.1)
SODIUM: 137 mmol/L (ref 135–145)
Sodium: 138 mmol/L (ref 135–145)

## 2014-08-31 LAB — LEGIONELLA ANTIGEN, URINE

## 2014-08-31 LAB — BLOOD GAS, ARTERIAL
Acid-Base Excess: 13.8 mmol/L — ABNORMAL HIGH (ref 0.0–2.0)
BICARBONATE: 38.2 meq/L — AB (ref 20.0–24.0)
DELIVERY SYSTEMS: POSITIVE
Drawn by: 275531
MODE: POSITIVE
O2 CONTENT: 6 L/min
O2 SAT: 89.5 %
PCO2 ART: 50.2 mmHg — AB (ref 35.0–45.0)
PO2 ART: 53.6 mmHg — AB (ref 80.0–100.0)
Patient temperature: 98.6
TCO2: 39.8 mmol/L (ref 0–100)
pH, Arterial: 7.494 — ABNORMAL HIGH (ref 7.350–7.450)

## 2014-08-31 LAB — URINE CULTURE
Culture: NO GROWTH
SPECIAL REQUESTS: NORMAL

## 2014-08-31 LAB — MAGNESIUM: MAGNESIUM: 2.2 mg/dL (ref 1.7–2.4)

## 2014-08-31 LAB — TROPONIN I: Troponin I: <0.03 ng/mL (ref <0.031)

## 2014-08-31 SURGERY — ECHOCARDIOGRAM, TRANSESOPHAGEAL
Anesthesia: Monitor Anesthesia Care

## 2014-08-31 MED ORDER — SODIUM CHLORIDE 0.9 % IV SOLN
3.0000 g | Freq: Four times a day (QID) | INTRAVENOUS | Status: DC
  Administered 2014-08-31 – 2014-09-03 (×13): 3 g via INTRAVENOUS
  Filled 2014-08-31 (×16): qty 3

## 2014-08-31 MED ORDER — FUROSEMIDE 40 MG PO TABS
40.0000 mg | ORAL_TABLET | Freq: Every day | ORAL | Status: DC
  Administered 2014-08-31 – 2014-09-04 (×5): 40 mg via ORAL
  Filled 2014-08-31 (×5): qty 1

## 2014-08-31 MED ORDER — METOPROLOL TARTRATE 12.5 MG HALF TABLET
12.5000 mg | ORAL_TABLET | Freq: Two times a day (BID) | ORAL | Status: DC
  Administered 2014-08-31 – 2014-09-04 (×9): 12.5 mg via ORAL
  Filled 2014-08-31 (×9): qty 1

## 2014-08-31 MED ORDER — POTASSIUM CHLORIDE CRYS ER 20 MEQ PO TBCR
40.0000 meq | EXTENDED_RELEASE_TABLET | ORAL | Status: AC
  Administered 2014-08-31 (×3): 40 meq via ORAL
  Filled 2014-08-31 (×3): qty 2

## 2014-08-31 MED ORDER — ACETAZOLAMIDE SODIUM 500 MG IJ SOLR
250.0000 mg | Freq: Two times a day (BID) | INTRAMUSCULAR | Status: DC
  Administered 2014-08-31 – 2014-09-01 (×3): 250 mg via INTRAVENOUS
  Filled 2014-08-31 (×6): qty 500

## 2014-08-31 NOTE — Progress Notes (Signed)
RN called RT due to PTS SPO2 dropping.  Pt sleepy.  Placed on CPAP with 6L Bleed in O2.  RN changing pulse ox probe.  SPO2 92%.

## 2014-08-31 NOTE — Progress Notes (Signed)
Pt desat, placed on veni mask at 45%, cont to drop into upper 70s and low 80s with rapid recovery, sleepy, apnea noted to coincide with desat, RT notified, MD notified, will cont to monitor closely. Raymon Mutton RN

## 2014-08-31 NOTE — Progress Notes (Signed)
Pts monitor showed O2 of 40%. Pt on CPAP, replaced O2 probe since pt breathing, called respiratory, increased FiO2 input to 6L, no change. Replaced telemetry cord and O2 increased to 93. Sats continue to dip and immediately climb back up. Repositioned in bed

## 2014-08-31 NOTE — Evaluation (Signed)
Physical Therapy Evaluation Patient Details Name: [name] MRN: [id number] DOB: 10/28/1955 Today's Date: 08/31/2014   History of Present Illness  [name] is a 59 y.o. female h/o turner's syndrome, mild aortic insufficiency. Patient went to her PCPs office today with respiratory distress and palpitations onset several days ago. Had been taking delsym for the SOB but this didn't help at all. Patient found to be in A.Fib with rate between 150-220  Clinical Impression  Patient presents with decreased mobility due to deficits listed in PT problem list.  She will benefit from skilled PT in the acute setting to allow return home with mother assist and HHPT if available.  Will need home oxygen due to severe hypoxemia with mobility on room air today.    Follow Up Recommendations Home health PT;Supervision/Assistance - 24 hour    Equipment Recommendations  None recommended by PT    Recommendations for Other Services OT consult     Precautions / Restrictions Precautions Precautions: Fall Precaution Comments: watch O2 sats      Mobility  Bed Mobility Overal bed mobility: Needs Assistance Bed Mobility: Supine to Sit;Sit to Supine     Supine to sit: Supervision;HOB elevated Sit to supine: Supervision   General bed mobility comments: relies on rail for supine to sit, assist for lines O2, IV; to supine cues for positioning  Transfers Overall transfer level: Needs assistance Equipment used: Rolling walker (2 wheeled) Transfers: Sit to/from Stand Sit to Stand: Supervision         General transfer comment: assist for safety, pulls up on walker, increased time and IV, O2 lines  Ambulation/Gait Ambulation/Gait assistance: Supervision;Min guard Ambulation Distance (Feet): 40 Feet (on RA, then 100' on 2L O2) Assistive device: Rolling walker (2 wheeled) Gait Pattern/deviations: Decreased stride length;Step-through pattern     General Gait Details: cues for safety  turning in room with lines (O2, IV); also mod dypnea when ambulating both on and off O2  Stairs            Wheelchair Mobility    Modified Rankin (Stroke Patients Only)       Balance Overall balance assessment: Needs assistance         Standing balance support: Bilateral upper extremity supported Standing balance-Leahy Scale: Fair Standing balance comment: supervision standing without UE support, needs walker for ambulation                             Pertinent Vitals/Pain Pain Assessment: No/denies pain     08/31/14 1415  Vital Signs  Pulse Rate (!) 113  Oxygen Therapy  SpO2 (!) 77 % (ambulating on room air)  O2 Device Room Air     08/31/14 1511  Vital Signs  Pulse Rate (!) 120  Oxygen Therapy  SpO2 (!) 86 % (ambulating on 2L O2)  O2 Device Nasal Cannula  O2 Flow Rate (L/min) 2 L/min     08/31/14 1512  Vital Signs  Pulse Rate (!) 103  Oxygen Therapy  SpO2 90 % (at rest on 2L O2)  O2 Device Nasal Cannula  O2 Flow Rate (L/min) 2 L/min      Home Living Family/patient expects to be discharged to:: Private residence Living Arrangements: Parent Available Help at Discharge: Family Type of Home: Apartment Home Access: Stairs to enter Entrance Stairs-Rails: None Entrance Stairs-Number of Steps: 1 Home Layout: One level Home Equipment: Environmental consultant - 2 wheels;Tub bench;Bedside commode  Prior Function Level of Independence: Independent with assistive device(s)               Hand Dominance        Extremity/Trunk Assessment   Upper Extremity Assessment: Generalized weakness           Lower Extremity Assessment: Generalized weakness      Cervical / Trunk Assessment: Other exceptions  Communication   Communication: No difficulties  Cognition Arousal/Alertness: Awake/alert Behavior During Therapy: WFL for tasks assessed/performed Overall Cognitive Status: History of cognitive impairments - at baseline                       General Comments General comments (skin integrity, edema, etc.): mother in the room throughout multiple questions about home O2    Exercises        Assessment/Plan    PT Assessment Patient needs continued PT services  PT Diagnosis Generalized weakness;Abnormality of gait   PT Problem List Decreased activity tolerance;Decreased mobility;Cardiopulmonary status limiting activity;Decreased strength;Decreased safety awareness  PT Treatment Interventions DME instruction;Balance training;Gait training;Functional mobility training;Stair training;Patient/family education;Therapeutic activities;Therapeutic exercise   PT Goals (Current goals can be found in the Care Plan section) Acute Rehab PT Goals Patient Stated Goal: To get stronger PT Goal Formulation: With patient/family Time For Goal Achievement: 09/07/14 Potential to Achieve Goals: Good    Frequency Min 3X/week   Barriers to discharge        Co-evaluation               End of Session Equipment Utilized During Treatment: Gait belt Activity Tolerance: Treatment limited secondary to medical complications (Comment) (hypoxemia) Patient left: in bed;with call bell/phone within reach;with family/visitor present           Time: 1345-1435 PT Time Calculation (min) (ACUTE ONLY): 50 min   Charges:   PT Evaluation $Initial PT Evaluation Tier I: 1 Procedure PT Treatments $Gait Training: 23-37 mins   PT G Codes:        [name],CYNDI 09-24-2014, 3:31 PM  [name], PT [phone] 2014-09-24

## 2014-08-31 NOTE — Progress Notes (Signed)
Paged Cards Fellow for HR going into 120s

## 2014-08-31 NOTE — Progress Notes (Signed)
Have had multiple problems with O2 sats reading low. Paged respiratory, CPAP rechecked, took pt off of remote monitor and placed on overhead for visible pleth and Resp. Rate. Respiratory's monitor showed a O2 in 90's while overhead showed it in 70s even after changing probe and tele cord. Respirations were in low/mid 20s. Pt is in no distress, no visible cyanosis, desats continue to quickly climb back to 90s, will continue to monitor

## 2014-08-31 NOTE — Progress Notes (Signed)
[facility] PROGRESS NOTE  [name] WUJ:[id number] DOB: 11-11-1955 DOA: 08/27/2014 PCP: [name], MD  Brief Summary  The patient is a 59 year old female with history of Turner syndrome, mild aortic valve insufficiency. She presented to her primary care doctor's office with respiratory distress and palpitations several days prior to admission. She was found to be in A. fib with RVR.  She received 20 mg of Cardizem which caused her systolic blood pressure did drop to the 70s systolic.  She was then transported by EMS to the emergency department.  8/24:  CT angio chest:  Lingular pneumonia, started zosyn.  Incidental pulm nodule.  Transitioned off dilt gtt 8/25 increasingly tachycardic but feeling better with diuresis 8/26:  CO2 elevated   Assessment/Plan  Atrial fibrillation with rapid ventricular response, Heart rate of 130-140 initially.  CHA2DS2-VASc Score and unadjusted Ischemic Stroke Rate (% per year) is equal to 2.2% stroke rate/year from a score of 2, female gender and hypertension -  Appreciate cardiology assistance -  Telemetry:  A-fib rate in 120s -  Continue diltiazem 360mg   -  Add metoprolol  -  continue apixaban -  Patient and mother declined cardioversion  Moderate aortic insufficiency with acute systolic heart failure -  Initially concern for bicuspid AV, however, ECHO demonstrated tricuspid valve -  Family declined TEE -  No evidence of aneurysmal dilation of thoracic aorta on CT -  Defer timing of cardiothoracic surgery consultation versus interventionalist consultation to cardiology -  Appreciate cardiology assistance  Acute hypoxic respiratory failure secondary to acute systolic heart failure, breathing initially improved this morning but she was on 4L [place] and developed worsening SOB on CPAP in evening with probable Cheyne Stokes breathing -  ABG c/w metabolic alkalosis, mild hypercarbia, and moderate to severe hypoxemia on CPAP 6L -  Started bipap  and transitioned to stepdown LOC -  Just had negative CT angio to rule out PE two days ago and has been on full a/c since -  Repeat CXR:  Effusions and edema improved, no clear infiltrate and still on abx -  ECHO:  Mild LVH with ejection fraction of 45-50% and diffuse hypokinesis -  Case discussed with Loletha Grayer, PCCM, who recommended trial of diamox, bipap -  Repeat ABG  -  Pulm consult in AM  Possible aspiration pneumonia -  Transition to unasyn day 3 of abx -  S. pneumo neg and legionella neg  Pulmonary nodule and mild mediastinal lymphadenopathy probably secondary to edema and pneumonia  -  Will need repeat CT chest in 6 months  Hypertension, BP low normal  -  Adding metoprolol so monitor closely  Turner syndrome, with some typical features such as webbed neck, Merdith Boyd stature, shield chest, but she does not appear to have a bicuspid aortic valve  Hypothyroidism, TSH 0.370 borderline low and free T4 mildly elevated at 1.43 -  Decrease to synthroid -  Repeat TFTs in 4 weeks  Leukocytosis, afebrile.  Possible pneumonia.  UCx neg.  Trending down  Hypokalemia due to diuresis, resolved with oral potassium repletion today.  Diet:  Healthy heart  Access:  PIV  IVF:  Off  Proph:  apixaban  Code Status: full code Family Communication:  Patient and her mother.   Disposition Plan:  Pending improvement in shortness of breath, O2 requirements increasing.   Consultants:   Cardiology, Dr. Royann Shivers  Procedures:  Echocardiogram  CT angio chest  Antibiotics:   None   HPI/Subjective:  States her breathing is  better.  Still no BM.  Eating well and denies nausea.  Tired.  Objective: Filed Vitals:   08/31/14 1415 08/31/14 1511 08/31/14 1512 08/31/14 1600  BP:      Pulse: 113 120 103 77  Temp:      TempSrc:      Resp:      Height:      Weight:      SpO2: 77% 86% 90% 85%    Intake/Output Summary (Last 24 hours) at 08/31/14 1707 Last data filed at 08/31/14  1242  Gross per 24 hour  Intake    970 ml  Output    700 ml  Net    270 ml   Filed Weights   08/29/14 0600 08/30/14 0557 08/31/14 0530  Weight: 65.908 kg (145 lb 4.8 oz) 64.139 kg (141 lb 6.4 oz) 63.73 kg (140 lb 8 oz)   Body mass index is 31.52 kg/(m^2).  Exam:   General:  Adult female, NAD  HEENT:  NCAT, MMM, webbed neck   Cardiovascular:  IRRR, nl S1, S2 no mrg, 2+ pulses, warm extremities  Respiratory:  Diminished bilateral breath sounds, no rales, no wheezes or rhonchi.    Abdomen:   NABS, soft, NT/ND  MSK:   Normal tone and bulk, 1+ RLE edema  Neuro:  Grossly moves all extremities  Data Reviewed: Basic Metabolic Panel:  Recent Labs Lab 08/27/14 1841 08/28/14 0530 08/29/14 0800 08/30/14 0240 08/31/14 0415  NA 141 142 137 140 137  K 3.4* 3.5 4.5 3.2* 2.8*  CL 104 106 102 96* 91*  CO2 26 27 25  34* 38*  GLUCOSE 90 108* 144* 126* 114*  BUN 32* 27* 24* 13 11  CREATININE 0.99 0.79 0.91 0.73 0.71  CALCIUM 8.5* 8.3* 8.6* 8.5* 8.4*  MG  --   --   --   --  2.2   Liver Function Tests:  Recent Labs Lab 08/29/14 0800  AST 40  ALT 78*  ALKPHOS 115  BILITOT 0.9  PROT 5.9*  ALBUMIN 3.4*   No results for input(s): LIPASE, AMYLASE in the last 168 hours. No results for input(s): AMMONIA in the last 168 hours. CBC:  Recent Labs Lab 08/27/14 1841 08/28/14 0530 08/29/14 0800 08/30/14 0240  WBC 10.4 9.1 15.2* 13.2*  HGB 13.6 13.5 14.5 14.0  HCT 40.5 41.4 43.6 41.7  MCV 98.1 99.3 99.3 98.1  PLT 282 249 299 249    Recent Results (from the past 240 hour(s))  MRSA PCR Screening     Status: None   Collection Time: 08/27/14 11:07 PM  Result Value Ref Range Status   MRSA by PCR NEGATIVE NEGATIVE Final    Comment:        The GeneXpert MRSA Assay (FDA approved for NASAL specimens only), is one component of a comprehensive MRSA colonization surveillance program. It is not intended to diagnose MRSA infection nor to guide or monitor treatment for MRSA  infections.   Urine culture     Status: None   Collection Time: 08/30/14  8:43 AM  Result Value Ref Range Status   Specimen Description URINE, RANDOM  Final   Special Requests Normal  Final   Culture NO GROWTH 1 DAY  Final   Report Status 08/31/2014 FINAL  Final     Studies: No results found.  Scheduled Meds: . ampicillin-sulbactam (UNASYN) IV  3 g Intravenous Q6H  . apixaban  5 mg Oral BID  . diltiazem  360 mg Oral Daily  . furosemide  40 mg Oral Daily  . levothyroxine  88 mcg Oral QAC breakfast  . metoprolol tartrate  12.5 mg Oral BID  . potassium chloride  40 mEq Oral Q4H   Continuous Infusions:    Principal Problem:   Atrial fibrillation with RVR Active Problems:   Benign hypertensive heart disease without heart failure   Turner syndrome   Aortic insufficiency   Hypertension   OSA (obstructive sleep apnea)   New onset a-fib   Acute respiratory failure with hypoxia   Acute systolic congestive heart failure   Leukocytosis   Aspiration pneumonia   Pulmonary nodule   LAD (lymphadenopathy), mediastinal    Time spent: 30 min    Torry Adamczak  [facility] Pager [phone]. If 7PM-7AM, please contact night-coverage at [email or web address], password [facility] 08/31/2014, 5:07 PM  LOS: 4 days

## 2014-08-31 NOTE — Progress Notes (Signed)
Spoke with Cards Fellow, no orders for HR hitting 130s then subsiding to 110s. Will continue to monitor

## 2014-08-31 NOTE — Procedures (Signed)
Pt placed on hospital CPAP with home mask with auto mode setting (max-15, min-5).  Pt is tolerating well and resting comfortably.

## 2014-08-31 NOTE — Procedures (Signed)
Pt placed on BiPAP with the following settings 12/6 & 10L.  RT will continue to monitor pt and if desats occur Rt will change from a black box BiPAP to a Vision or V60.

## 2014-08-31 NOTE — Progress Notes (Signed)
Subjective: No complaints   Objective: Vital signs in last 24 hours: Temp:  [97.5 F (36.4 C)-98.5 F (36.9 C)] 97.7 F (36.5 C) (08/26 0530) Pulse Rate:  [79-132] 121 (08/26 0800) Resp:  [18-22] 20 (08/26 0800) BP: (118-129)/(74-84) 129/78 mmHg (08/26 0436) SpO2:  [90 %-98 %] 96 % (08/26 0800) Weight:  [140 lb 8 oz (63.73 kg)] 140 lb 8 oz (63.73 kg) (08/26 0530) Weight change: -14.4 oz (-0.408 kg) Last BM Date: 08/27/14 Intake/Output from previous day: 08/25 0701 - 08/26 0700 In: 870 [P.O.:720; IV Piggyback:150] Out: 1510 [Urine:1510] Intake/Output this shift: Total I/O In: 240 [P.O.:240] Out: 250 [Urine:250]  PE: General:Pleasant affect, NAD Skin:Warm and dry, brisk capillary refill HEENT:normocephalic, sclera clear, mucus membranes moist Heart:irreg irreg with soft murmur, gallup, rub or click Lungs:clear without rales, rhonchi, or wheezes WNU:UVOZ, non tender, + BS, do not palpate liver spleen or masses Ext:no lower ext edema, 2+ pedal pulses, 2+ radial pulses Neuro:alert and oriented, MAE, follows commands, + facial symmetry Tele: a fib with rate up to 130 at times    Lab Results:  Recent Labs  08/29/14 0800 08/30/14 0240  WBC 15.2* 13.2*  HGB 14.5 14.0  HCT 43.6 41.7  PLT 299 249   BMET  Recent Labs  08/30/14 0240 08/31/14 0415  NA 140 137  K 3.2* 2.8*  CL 96* 91*  CO2 34* 38*  GLUCOSE 126* 114*  BUN 13 11  CREATININE 0.73 0.71  CALCIUM 8.5* 8.4*    Recent Labs  08/28/14 1505 08/28/14 2203  TROPONINI 0.04* 0.04*    Lab Results  Component Value Date   CHOL 163 08/29/2014   HDL 26* 08/29/2014   LDLCALC 104* 08/29/2014   TRIG 163* 08/29/2014   CHOLHDL 6.3 08/29/2014   Lab Results  Component Value Date   HGBA1C 5.7* 08/28/2014     Lab Results  Component Value Date   TSH 0.370 08/28/2014    Hepatic Function Panel  Recent Labs  08/29/14 0800  PROT 5.9*  ALBUMIN 3.4*  AST 40  ALT 78*  ALKPHOS 115  BILITOT  0.9  BILIDIR 0.2  IBILI 0.7    Recent Labs  08/29/14 0800  CHOL 163   No results for input(s): PROTIME in the last 72 hours.     Studies/Results: Ct Angio Chest Pe W/cm &/or Wo Cm  08/29/2014   CLINICAL DATA:  Elevated D-dimer.  Extreme shortness of breath.  EXAM: CT ANGIOGRAPHY CHEST WITH CONTRAST  TECHNIQUE: Multidetector CT imaging of the chest was performed using the standard protocol during bolus administration of intravenous contrast. Multiplanar CT image reconstructions and MIPs were obtained to evaluate the vascular anatomy.  CONTRAST:  OMNIPAQUE IOHEXOL 350 MG/ML SOLN  COMPARISON:  Chest x-ray 08/29/2014  FINDINGS: Aberrant venous anatomy. The left innominate vein is not visualized. 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 Good opacification of pulmonary arteries. No filling defects in the pulmonary arteries to suggest pulmonary emboli. The aorta is un opacified. Tortuosity of the aorta with calcifications in the aortic arch and descending thoracic aorta. No evidence of aortic aneurysm. Maximum diameter 3.6 cm in the proximal descending thoracic aorta. The aorta is not opacified and. Therefore, cannot evaluate for dissection.  There are enlarged mediastinal lymph nodes. AP window lymph node has a short axis diameter of 19 mm. Prevascular node has a short axis diameter of 11 mm.  Right peritracheal node has a short axis diameter of 13 mm. No hilar or axillary adenopathy.  There is cardiomegaly. There are small bilateral pleural effusions. Vascular congestion noted. There is dependent opacities in the lower lobes, likely compressive atelectasis. Airspace opacity within the lingula is concerning for pneumonia. Also within the lingula peripherally is a 7 mm nodule on image 70. This may be inflammatory but warrants followup.  Chest wall soft tissues are unremarkable. Imaging into the upper  abdomen shows no acute findings.  Review of the MIP images confirms the above findings.  IMPRESSION: No evidence of pulmonary embolus. No evidence of aortic aneurysm. The aorta is un opacified and therefore dissection cannot be completely excluded.  Small bilateral pleural effusions with compressive atelectasis in the lower lobes. Area of consolidation in the lingula is concerning for pneumonia.  7 mm lingular nodule which could be inflammatory but warrants followup. If the patient is at high risk for bronchogenic carcinoma, follow-up chest CT at 3-72months is recommended. If the patient is at low risk for bronchogenic carcinoma, follow-up chest CT at 6-12 months is recommended. This recommendation follows the consensus statement: Guidelines for Management of Small Pulmonary Nodules Detected on CT Scans: A Statement from the Fleischner Society as published in Radiology 2005; 237:395-400.  Cardiomegaly, vascular congestion.  Mild mediastinal adenopathy. Recommend attention on followup imaging.   Electronically Signed   By: Charlett Nose M.D.   On: 08/29/2014 11:18   Dg Chest Port 1 View  08/29/2014   CLINICAL DATA:  Dyspnea today.  EXAM: PORTABLE CHEST - 1 VIEW  COMPARISON:  Single view of the chest 08/27/2014. PA and lateral chest 08/15/2012.  FINDINGS: There is cardiomegaly and pulmonary edema. Small bilateral pleural effusions are seen. No pneumothorax. Basilar atelectasis noted.  IMPRESSION: Cardiomegaly and pulmonary edema with small bilateral pleural effusions.   Electronically Signed   By: Drusilla Kanner M.D.   On: 08/29/2014 10:14    Medications: I have reviewed the patient's current medications. Scheduled Meds: . apixaban  5 mg Oral BID  . diltiazem  360 mg Oral Daily  . furosemide  40 mg Oral Daily  . levothyroxine  88 mcg Oral QAC breakfast  . metoprolol tartrate  12.5 mg Oral BID  . piperacillin-tazobactam (ZOSYN)  IV  3.375 g Intravenous Q8H  . 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     Principal Problem:   Atrial fibrillation with RVR Active Problems:   Benign hypertensive heart disease without heart failure   Turner syndrome   Aortic insufficiency   Hypertension   OSA (obstructive sleep apnea)   New onset a-fib   Acute respiratory failure with hypoxia   Acute systolic congestive heart failure   Leukocytosis   Aspiration pneumonia   Pulmonary nodule   LAD (lymphadenopathy), mediastinal  1. Atrial fibrillation with RVR - Rate un controlled with any activity up to 130 on PO cardidizem 90mg  Q6. placed on long acting today Flat troponin at 0.04. - EF 45-50% with diffuse hypokinesis. Moderate AI. The LA is severely dilated. Peak PA pressure . Rapid rate during echo made interpretation  more difficult. Repeat echo once rate is stable. Needs to decide on long-term rate control vs rhythm control based on symptoms. DCCV after 3-4 weeks of anticoagulation. - Transition from heparin --> Apixaban per pharmacy started yesterday Mother discussed TEE with MD but they decided against.    2. Aortic insufficiency, moderate - as above   3. Hypertension - stable.    4. OSA (obstructive sleep apnea) - On CPAP  5. Acute systolic congestive heart failure - Net fluids: -3,698 since admit. Cardiomegaly and pulmonary edema with small bilateral pleural effusions. 4lb weight loss in 24 hours.PO beginning today at 40 mg po daily. Wt at 140 down from 145 on the 24th.  6. Hypokalemia - K of 2.8 today- Kdur ordered every 4 hours.     Leukocytosis -PNA, per IM  Turner  syndrome  LOS: 4 days   Time spent with pt. :15  minutes. [facility] R  Nurse Practitioner Certified Pager [phone] or after 5pm and on weekends call [phone] 08/31/2014, 8:50 AM   I have seen and examined the patient along with [facility] R , NP.  I have reviewed the chart, notes and new data.  I agree with NP's note.  Key new complaints: feels well Key examination changes: ventricular rate is well controlled today Key new findings / data: K 2.8, being replaced  PLAN: Continue conservative approach with rate control and anticoagulation, reevaluate for DCCV in 3 weeks and also plan to reevaluate AI and LVEF. Uncertain about severity of AI and etiology of cardiomyopathy (tachycardia related or AI?).  Thurmon Fair, MD, [facility] [phone] 08/31/2014, 10:39 AM

## 2014-09-01 DIAGNOSIS — J690 Pneumonitis due to inhalation of food and vomit: Secondary | ICD-10-CM

## 2014-09-01 DIAGNOSIS — J9622 Acute and chronic respiratory failure with hypercapnia: Secondary | ICD-10-CM

## 2014-09-01 DIAGNOSIS — I10 Essential (primary) hypertension: Secondary | ICD-10-CM

## 2014-09-01 DIAGNOSIS — R0600 Dyspnea, unspecified: Secondary | ICD-10-CM

## 2014-09-01 DIAGNOSIS — I429 Cardiomyopathy, unspecified: Secondary | ICD-10-CM

## 2014-09-01 DIAGNOSIS — I77810 Thoracic aortic ectasia: Secondary | ICD-10-CM

## 2014-09-01 LAB — POCT I-STAT 3, ART BLOOD GAS (G3+)
Acid-Base Excess: 7 mmol/L — ABNORMAL HIGH (ref 0.0–2.0)
Bicarbonate: 33.6 mEq/L — ABNORMAL HIGH (ref 20.0–24.0)
O2 SAT: 96 %
PCO2 ART: 55.4 mmHg — AB (ref 35.0–45.0)
PH ART: 7.391 (ref 7.350–7.450)
PO2 ART: 89 mmHg (ref 80.0–100.0)
TCO2: 35 mmol/L (ref 0–100)

## 2014-09-01 LAB — TROPONIN I: Troponin I: <0.03 ng/mL (ref <0.031)

## 2014-09-01 LAB — BASIC METABOLIC PANEL
ANION GAP: 7 (ref 5–15)
BUN: 17 mg/dL (ref 6–20)
CALCIUM: 8.7 mg/dL — AB (ref 8.9–10.3)
CO2: 33 mmol/L — ABNORMAL HIGH (ref 22–32)
Chloride: 98 mmol/L — ABNORMAL LOW (ref 101–111)
Creatinine, Ser: 0.75 mg/dL (ref 0.44–1.00)
GFR calc Af Amer: >60 mL/min (ref >60)
GLUCOSE: 127 mg/dL — AB (ref 65–99)
POTASSIUM: 4.4 mmol/L (ref 3.5–5.1)
SODIUM: 138 mmol/L (ref 135–145)

## 2014-09-01 LAB — MAGNESIUM: MAGNESIUM: 2.2 mg/dL (ref 1.7–2.4)

## 2014-09-01 NOTE — Progress Notes (Signed)
SUBJECTIVE: Feeling better than yesterday.     Intake/Output Summary (Last 24 hours) at 09/01/14 1020 Last data filed at 09/01/14 0455  Gross per 24 hour  Intake    220 ml  Output    725 ml  Net   -505 ml    Current Facility-Administered Medications  Medication Dose Route Frequency Provider Last Rate Last Dose  . acetaZOLAMIDE (DIAMOX) injection 250 mg  250 mg Intravenous BID Renae Fickle, MD   250 mg at 08/31/14 2211  . Ampicillin-Sulbactam (UNASYN) 3 g in sodium chloride 0.9 % 100 mL IVPB  3 g Intravenous Q6H Renae Fickle, MD   3 g at 09/01/14 0416  . apixaban (ELIQUIS) tablet 5 mg  5 mg Oral BID Titus Mould, RPH   5 mg at 08/31/14 2212  . diltiazem (CARDIZEM LA) 24 hr tablet 360 mg  360 mg Oral Daily Mihai Croitoru, MD   360 mg at 08/31/14 1024  . furosemide (LASIX) tablet 40 mg  40 mg Oral Daily Renae Fickle, MD   40 mg at 08/31/14 0900  . levothyroxine (SYNTHROID, LEVOTHROID) tablet 88 mcg  88 mcg Oral QAC breakfast Renae Fickle, MD   88 mcg at 08/31/14 0815  . metoprolol tartrate (LOPRESSOR) tablet 12.5 mg  12.5 mg Oral BID Renae Fickle, MD   12.5 mg at 08/31/14 2213  . ondansetron (ZOFRAN) injection 4 mg  4 mg Intravenous Q6H PRN Renae Fickle, MD   4 mg at 08/29/14 1326    Filed Vitals:   08/31/14 2213 09/01/14 0000 09/01/14 0452 09/01/14 0800  BP:  115/78 118/70   Pulse: 86 76 81 87  Temp:  98.3 F (36.8 C) 97.9 F (36.6 C)   TempSrc:  Oral Axillary   Resp:  18 17   Height:      Weight:   143 lb 4.8 oz (65 kg)   SpO2:  95% 93% 94%    PHYSICAL EXAM General: NAD, using CPAP. HEENT: Webbed neck. Neck: No JVD, no thyromegaly.  Lungs: Diminished with wheezes anteriorly. CV: Regular rate, irregular rhythm, normal S1/S2, no S3, no murmur.  No pretibial edema. Abdomen: Soft, obese, no distention.  Neurologic: Alert.  Extremities: No clubbing or cyanosis.   TELEMETRY: Reviewed telemetry pt in atrial fibrillation.  LABS: Basic Metabolic  Panel:  Recent Labs  08/31/14 0415 08/31/14 1819 09/01/14 0455  NA 137 138 138  K 2.8* 4.1 4.4  CL 91* 94* 98*  CO2 38* 37* 33*  GLUCOSE 114* 138* 127*  BUN 11 16 17   CREATININE 0.71 0.78 0.75  CALCIUM 8.4* 8.8* 8.7*  MG 2.2  --  2.2   Liver Function Tests: No results for input(s): AST, ALT, ALKPHOS, BILITOT, PROT, ALBUMIN in the last 72 hours. No results for input(s): LIPASE, AMYLASE in the last 72 hours. CBC:  Recent Labs  08/30/14 0240  WBC 13.2*  HGB 14.0  HCT 41.7  MCV 98.1  PLT 249   Cardiac Enzymes:  Recent Labs  08/31/14 1819 08/31/14 2316 09/01/14 0455  TROPONINI <0.03 <0.03 <0.03   BNP: Invalid input(s): POCBNP D-Dimer: No results for input(s): DDIMER in the last 72 hours. Hemoglobin A1C: No results for input(s): HGBA1C in the last 72 hours. Fasting Lipid Panel: No results for input(s): CHOL, HDL, LDLCALC, TRIG, CHOLHDL, LDLDIRECT in the last 72 hours. Thyroid Function Tests: No results for input(s): TSH, T4TOTAL, T3FREE, THYROIDAB in the last 72 hours.  Invalid input(s): FREET3 Anemia Panel: No  results for input(s): VITAMINB12, FOLATE, FERRITIN, TIBC, IRON, RETICCTPCT in the last 72 hours.  RADIOLOGY: Ct Angio Chest Pe W/cm &/or Wo Cm  08/29/2014   CLINICAL DATA:  Elevated D-dimer.  Extreme shortness of breath.  EXAM: CT ANGIOGRAPHY CHEST WITH CONTRAST  TECHNIQUE: Multidetector CT imaging of the chest was performed using the standard protocol during bolus administration of intravenous contrast. Multiplanar CT image reconstructions and MIPs were obtained to evaluate the vascular anatomy.  CONTRAST:  OMNIPAQUE IOHEXOL 350 MG/ML SOLN  COMPARISON:  Chest x-ray 08/29/2014  FINDINGS: Aberrant venous anatomy. The left innominate vein is not visualized. 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 Good opacification of pulmonary arteries. No filling defects in  the pulmonary arteries to suggest pulmonary emboli. The aorta is un opacified. Tortuosity of the aorta with calcifications in the aortic arch and descending thoracic aorta. No evidence of aortic aneurysm. Maximum diameter 3.6 cm in the proximal descending thoracic aorta. The aorta is not opacified and. Therefore, cannot evaluate for dissection.  There are enlarged mediastinal lymph nodes. AP window lymph node has a short axis diameter of 19 mm. Prevascular node has a short axis diameter of 11 mm. Right peritracheal node has a short axis diameter of 13 mm. No hilar or axillary adenopathy.  There is cardiomegaly. There are small bilateral pleural effusions. Vascular congestion noted. There is dependent opacities in the lower lobes, likely compressive atelectasis. Airspace opacity within the lingula is concerning for pneumonia. Also within the lingula peripherally is a 7 mm nodule on image 70. This may be inflammatory but warrants followup.  Chest wall soft tissues are unremarkable. Imaging into the upper abdomen shows no acute findings.  Review of the MIP images confirms the above findings.  IMPRESSION: No evidence of pulmonary embolus. No evidence of aortic aneurysm. The aorta is un opacified and therefore dissection cannot be completely excluded.  Small bilateral pleural effusions with compressive atelectasis in the lower lobes. Area of consolidation in the lingula is concerning for pneumonia.  7 mm lingular nodule which could be inflammatory but warrants followup. If the patient is at high risk for bronchogenic carcinoma, follow-up chest CT at 3-30months is recommended. If the patient is at low risk for bronchogenic carcinoma, follow-up chest CT at 6-12 months is recommended. This recommendation follows the consensus statement: Guidelines for Management of Small Pulmonary Nodules Detected on CT Scans: A Statement from the Fleischner Society as published in Radiology 2005; 237:395-400.  Cardiomegaly, vascular  congestion.  Mild mediastinal adenopathy. Recommend attention on followup imaging.   Electronically Signed   By: Charlett Nose M.D.   On: 08/29/2014 11:18   Dg Chest Port 1 View  08/31/2014   CLINICAL DATA:  Hypoxia  EXAM: PORTABLE CHEST - 1 VIEW  COMPARISON:  08/29/2014  FINDINGS: Moderate enlargement of the cardiopericardial silhouette, stable. No mediastinal or hilar masses or evidence of adenopathy.  Mild opacity noted at the right lung base likely combination of atelectasis and a small pleural effusion. No evidence of pulmonary edema. No pneumonia. No pneumothorax.  IMPRESSION: 1. Resolved pulmonary edema. 2. No new lung abnormalities.   Electronically Signed   By: Amie Portland M.D.   On: 08/31/2014 17:46   Dg Chest Port 1 View  08/29/2014   CLINICAL DATA:  Dyspnea today.  EXAM: PORTABLE CHEST - 1 VIEW  COMPARISON:  Single view of the chest 08/27/2014. PA and lateral chest 08/15/2012.  FINDINGS: There  is cardiomegaly and pulmonary edema. Small bilateral pleural effusions are seen. No pneumothorax. Basilar atelectasis noted.  IMPRESSION: Cardiomegaly and pulmonary edema with small bilateral pleural effusions.   Electronically Signed   By: Drusilla Kanner M.D.   On: 08/29/2014 10:14   Dg Chest Port 1 View  08/27/2014   CLINICAL DATA:  Shortness of breath  EXAM: PORTABLE CHEST - 1 VIEW  COMPARISON:  08/15/2012  FINDINGS: Cardiomegaly with pulmonary vascular congestion. No frank interstitial edema. Mild right basilar atelectasis. No pleural effusion or pneumothorax.  IMPRESSION: Cardiomegaly with pulmonary vascular congestion. No frank interstitial edema.   Electronically Signed   By: Charline Bills M.D.   On: 08/27/2014 17:48      ASSESSMENT AND PLAN: 1. Rapid atrial fibrillation: Rate controlled. On apixaban. Given sleep apnea and severe left atrial enlargement, I doubt ability to maintain sinus rhythm for any extended period of time. I think a rate control with anticoagulation strategy is  likely to be most successful.  2. Moderate aortic insufficiency with LVEF 45-50%: Tricuspid AV with mild aortic root dilatation by echo. Consider outpatient TEE to better characterize severity of regurgitation.  3. OSA on CPAP with witnessed apneic episodes: Contributing to atrial fib. Using CPAP.  4. Acute systolic heart failure, LVEF 45-50%: Possibly tachycardia-mediated given diffuse hypokinesis. Would expect aortic regurgitation contribution if it had been severely regurgitant. Consider outpatient TEE to better characterize severity of regurgitation. Would also consider outpatient nuclear stress test to rule out ischemic etiology. 515 cc output in last 24 hours on Lasix 40 mg. Continue at present dose.   Prentice Docker, M.D., F.A.C.C.

## 2014-09-01 NOTE — Consult Note (Addendum)
Name: [name] MRN: [id number] DOB: [birth year]    ADMISSION DATE:  08/27/2014 CONSULTATION DATE:  09/01/2014  REFERRING MD :  Malachi Bonds TRH  CHIEF COMPLAINT:   Progressive dyspnea   BRIEF PATIENT DESCRIPTION:  59 year old female primary patient of Dr. Shelle Iron with severe sleep apnea on C Pap at home Admitted 08/27/2014 with acute pulmonary edema and A. fib with RVR. Pulmonary asked to comment on hypercarbia and C Pap therapy.  There is also been a question of aspiration and inability protect the airway  SIGNIFICANT EVENTS    STUDIES:  CT scan of chest did not show pulmonary emboli but showed bilateral pulmonary edema and pleural effusions, and left lung pneumonia   HISTORY OF PRESENT ILLNESS:   59 year old female initially admitted from the primary care office and then transported to the emergency room secondary to acute dyspnea and atrial fibrillation with heart rate 1 5220. Patient also had hypotension initially. Patient was initially admitted to triad hospitalist service and given rate control. Cardiology was also consult.  Progressive hypoxemia was noted. Noted home the patient's on oxygen at night only with C Pap. The patient is not on oxygen during the day. Because of infiltrate seen on x-ray Unasyn was added to the patient's coverage.    The patient's been noted to have progressive hypoxia and hypercarbia on blood gases. On this basis pulmonary was consulted.    PAST MEDICAL HISTORY :   has a past medical history of Turner's syndrome; Aortic insufficiency; Hypothyroidism; Periorbital edema; Hypertension; Cervix abnormality (1993); Dermatitis (05-26-12); Edema of both legs; Speech disorder; Difficult intubation; Abdominal aortic stenosis; and Acute systolic congestive heart failure (08/29/2014).  has past surgical history that includes Total hip arthroplasty; US ECHOCARDIOGRAPHY (06/20/2009); Dilation and curettage of uterus (1993); Cervical cone biopsy (1993); Hernia repair;  Colposcopy; Joint replacement; and Total knee arthroplasty (Right, 05/31/2012). Prior to Admission medications   Medication Sig Start Date End Date Taking? Authorizing Provider  Cholecalciferol (VITAMIN D3) 2000 UNITS capsule Take 2,000 Units by mouth daily.   Yes Historical Provider, MD  furosemide (LASIX) 40 MG tablet TAKE ONE TABLET BY MOUTH ONCE DAILY BEFORE BREAKFAST 08/20/14  Yes Cassell Clement, MD  KLOR-CON M10 10 MEQ tablet TAKE ONE TABLET BY MOUTH TWICE DAILY 08/13/14  Yes Cassell Clement, MD  levothyroxine (SYNTHROID, LEVOTHROID) 100 MCG tablet Take 1 tablet (100 mcg total) by mouth daily before breakfast. 05/23/13  Yes Cassell Clement, MD  metoprolol (LOPRESSOR) 50 MG tablet Take 25 mg by mouth daily.   Yes Historical Provider, MD   Allergies  Allergen Reactions  . Cephalexin     Prefers not to have ? Psoriasis skin condition    FAMILY HISTORY:  family history includes Atrial fibrillation in her mother; Breast cancer in her cousin; Cancer in her mother; Diabetes in her maternal uncle; Hypertension in her mother. SOCIAL HISTORY:  reports that she has never smoked. She has never used smokeless tobacco. She reports that she does not drink alcohol or use illicit drugs.  REVIEW OF SYSTEMS:   Not obtainable   SUBJECTIVE:  Patient currently sleeping with nasal cannula in place in no distress  VITAL SIGNS: Temp:  [97.9 F (36.6 C)-98.3 F (36.8 C)] 97.9 F (36.6 C) (08/27 0452) Pulse Rate:  [76-120] 87 (08/27 0800) Resp:  [17-22] 17 (08/27 0452) BP: (109-122)/(66-78) 118/70 mmHg (08/27 0452) SpO2:  [77 %-95 %] 94 % (08/27 0800) Weight:  [65 kg (143 lb 4.8 oz)] 65 kg (143 lb 4.8 oz) (08/27 0452)  PHYSICAL EXAMINATION: General:  Sleeping with no acute distress Neuro:  No focal deficits HEENT:  Dry mucous membranes, turners syndrome facies Cardiovascular:  Irregular regular normal S1-S2 Lungs:  Distant breath sounds Abdomen:  Soft nontender bowel sounds active no  organomegaly Musculoskeletal:  Full range of motion no joint deformity Skin:  Clear   Recent Labs Lab 08/31/14 0415 08/31/14 1819 09/01/14 0455  NA 137 138 138  K 2.8* 4.1 4.4  CL 91* 94* 98*  CO2 38* 37* 33*  BUN 11 16 17   CREATININE 0.71 0.78 0.75  GLUCOSE 114* 138* 127*    Recent Labs Lab 08/28/14 0530 08/29/14 0800 08/30/14 0240  HGB 13.5 14.5 14.0  HCT 41.4 43.6 41.7  WBC 9.1 15.2* 13.2*  PLT 249 299 249   Dg Chest Port 1 View  08/31/2014   CLINICAL DATA:  Hypoxia  EXAM: PORTABLE CHEST - 1 VIEW  COMPARISON:  08/29/2014  FINDINGS: Moderate enlargement of the cardiopericardial silhouette, stable. No mediastinal or hilar masses or evidence of adenopathy.  Mild opacity noted at the right lung base likely combination of atelectasis and a small pleural effusion. No evidence of pulmonary edema. No pneumonia. No pneumothorax.  IMPRESSION: 1. Resolved pulmonary edema. 2. No new lung abnormalities.   Electronically Signed   By: Amie Portland M.D.   On: 08/31/2014 17:46    ASSESSMENT / PLAN:  #1 obstructive sleep apnea with significant poor respiratory drive and acute on chronic hypercarbic and hypoxic respiratory failure.     The patient currently is only on C Pap at home. I'm unable to determine the settings of the C Pap the patient's record at this time. In any case because of hypercarbia and hypoxemia this patient will likely need bilevel support at home. We will change settings to BiPAP for now in the hospital setting.  Note in the past the patient's been unable to perform pulmonary function testing secondary to her mental status   Recommendations   Change C Pap to BiPAP with oxygen supplementation. The patient should wear this    during sleep at night and as needed during daytime   Pulmonary will follow-up with you during this admission and help coordinate the    ordering of the bilevel support at home   No indication for bronchial dilator therapy at this time   The patient  will likely need oxygen therapy during the day as well at home   Continue to keep the patient's lungs dry with diuresis as indicated  #2 aspiration pneumonia   This appears to be a real diagnosis and I do agree with current antibiotics coverage   Shan Levans MD Beeper  [phone]  Cell  [phone]  If no response or cell goes to voicemail, call beeper [phone]  Pulmonary and Critical Care Medicine [facility] Pager: [phone]  09/01/2014, 10:28 AM

## 2014-09-01 NOTE — Discharge Instructions (Signed)
Atrial Fibrillation °Atrial fibrillation is a type of irregular heart rhythm (arrhythmia). During atrial fibrillation, the upper chambers of the heart (atria) quiver continuously in a chaotic pattern. This causes an irregular and often rapid heart rate.  °Atrial fibrillation is the result of the heart becoming overloaded with disorganized signals that tell it to beat. These signals are normally released one at a time by a part of the right atrium called the sinoatrial node. They then travel from the atria to the lower chambers of the heart (ventricles), causing the atria and ventricles to contract and pump blood as they pass. In atrial fibrillation, parts of the atria outside of the sinoatrial node also release these signals. This results in two problems. First, the atria receive so many signals that they do not have time to fully contract. Second, the ventricles, which can only receive one signal at a time, beat irregularly and out of rhythm with the atria.  °There are three types of atrial fibrillation:  °· Paroxysmal. Paroxysmal atrial fibrillation starts suddenly and stops on its own within a week. °· Persistent. Persistent atrial fibrillation lasts for more than a week. It may stop on its own or with treatment. °· Permanent. Permanent atrial fibrillation does not go away. Episodes of atrial fibrillation may lead to permanent atrial fibrillation. °Atrial fibrillation can prevent your heart from pumping blood normally. It increases your risk of stroke and can lead to heart failure.  °CAUSES  °· Heart conditions, including a heart attack, heart failure, coronary artery disease, and heart valve conditions.   °· Inflammation of the sac that surrounds the heart (pericarditis). °· Blockage of an artery in the lungs (pulmonary embolism). °· Pneumonia or other infections. °· Chronic lung disease. °· Thyroid problems, especially if the thyroid is overactive (hyperthyroidism). °· Caffeine, excessive alcohol use, and use  of some illegal drugs.   °· Use of some medicines, including certain decongestants and diet pills. °· Heart surgery.   °· Birth defects.   °Sometimes, no cause can be found. When this happens, the atrial fibrillation is called lone atrial fibrillation. The risk of complications from atrial fibrillation increases if you have lone atrial fibrillation and you are age 60 years or older. °RISK FACTORS °· Heart failure. °· Coronary artery disease. °· Diabetes mellitus.   °· High blood pressure (hypertension).   °· Obesity.   °· Other arrhythmias.   °· Increased age. °SIGNS AND SYMPTOMS  °· A feeling that your heart is beating rapidly or irregularly.   °· A feeling of discomfort or pain in your chest.   °· Shortness of breath.   °· Sudden light-headedness or weakness.   °· Getting tired easily when exercising.   °· Urinating more often than normal (mainly when atrial fibrillation first begins).   °In paroxysmal atrial fibrillation, symptoms may start and suddenly stop. °DIAGNOSIS  °Your health care provider may be able to detect atrial fibrillation when taking your pulse. Your health care provider may have you take a test called an ambulatory electrocardiogram (ECG). An ECG records your heartbeat patterns over a 24-hour period. You may also have other tests, such as: °· Transthoracic echocardiogram (TTE). During echocardiography, sound waves are used to evaluate how blood flows through your heart. °· Transesophageal echocardiogram (TEE). °· Stress test. There is more than one type of stress test. If a stress test is needed, ask your health care provider about which type is best for you. °· Chest X-ray exam. °· Blood tests. °· Computed tomography (CT). °TREATMENT  °Treatment may include: °· Treating any underlying conditions. For example, if you   have an overactive thyroid, treating the condition may correct atrial fibrillation.  Taking medicine. Medicines may be given to control a rapid heart rate or to prevent blood  clots, heart failure, or a stroke.  Having a procedure to correct the rhythm of the heart:  Electrical cardioversion. During electrical cardioversion, a controlled, low-energy shock is delivered to the heart through your skin. If you have chest pain, very low blood pressure, or sudden heart failure, this procedure may need to be done as an emergency.  Catheter ablation. During this procedure, heart tissues that send the signals that cause atrial fibrillation are destroyed.  Surgical ablation. During this surgery, thin lines of heart tissue that carry the abnormal signals are destroyed. This procedure can either be an open-heart surgery or a minimally invasive surgery. With the minimally invasive surgery, small cuts are made to access the heart instead of a large opening.  Pulmonary venous isolation. During this surgery, tissue around the veins that carry blood from the lungs (pulmonary veins) is destroyed. This tissue is thought to carry the abnormal signals. HOME CARE INSTRUCTIONS   Take medicines only as directed by your health care provider. Some medicines can make atrial fibrillation worse or recur.  If blood thinners were prescribed by your health care provider, take them exactly as directed. Too much blood-thinning medicine can cause bleeding. If you take too little, you will not have the needed protection against stroke and other problems.  Perform blood tests at home if directed by your health care provider. Perform blood tests exactly as directed.  Quit smoking if you smoke.  Do not drink alcohol.  Do not drink caffeinated beverages such as coffee, soda, and some teas. You may drink decaffeinated coffee, soda, or tea.   Maintain a healthy weight.Do not use diet pills unless your health care provider approves. They may make heart problems worse.   Follow diet instructions as directed by your health care provider.  Exercise regularly as directed by your health care  provider.  Keep all follow-up visits as directed by your health care provider. This is important. PREVENTION  The following substances can cause atrial fibrillation to recur:   Caffeinated beverages.  Alcohol.  Certain medicines, especially those used for breathing problems.  Certain herbs and herbal medicines, such as those containing ephedra or ginseng.  Illegal drugs, such as cocaine and amphetamines. Sometimes medicines are given to prevent atrial fibrillation from recurring. Proper treatment of any underlying condition is also important in helping prevent recurrence.  SEEK MEDICAL CARE IF:  You notice a change in the rate, rhythm, or strength of your heartbeat.  You suddenly begin urinating more frequently.  You tire more easily when exerting yourself or exercising. SEEK IMMEDIATE MEDICAL CARE IF:   You have chest pain, abdominal pain, sweating, or weakness.  You feel nauseous.  You have shortness of breath.  You suddenly have swollen feet and ankles.  You feel dizzy.  Your face or limbs feel numb or weak.  You have a change in your vision or speech. MAKE SURE YOU:   Understand these instructions.  Will watch your condition.  Will get help right away if you are not doing well or get worse. Document Released: 12/22/2004 Document Revised: 05/08/2013 Document Reviewed: 02/02/2012 [facility] Patient Information 2015 [place], [place]. This information is not intended to replace advice given to you by your health care provider. Make sure you discuss any questions you have with your health care provider. _________________________________________________________________________________________________________________________________________ Information on my medicine -  ELIQUIS (apixaban)  This medication education was reviewed with me or my healthcare representative as part of my discharge preparation.  The pharmacist that spoke with me during my hospital stay was:   [place] Pink, [facility]  Why was Eliquis prescribed for you? Eliquis was prescribed for you to reduce the risk of a blood clot forming that can cause a stroke if you have a medical condition called atrial fibrillation (a type of irregular heartbeat).  What do You need to know about Eliquis ? Take your Eliquis TWICE DAILY - one tablet in the morning and one tablet in the evening with or without food. If you have difficulty swallowing the tablet whole please discuss with your pharmacist how to take the medication safely.  Take Eliquis exactly as prescribed by your doctor and DO NOT stop taking Eliquis without talking to the doctor who prescribed the medication.  Stopping may increase your risk of developing a stroke.  Refill your prescription before you run out.  After discharge, you should have regular check-up appointments with your healthcare provider that is prescribing your Eliquis.  In the future your dose may need to be changed if your kidney function or weight changes by a significant amount or as you get older.  What do you do if you miss a dose? If you miss a dose, take it as soon as you remember on the same day and resume taking twice daily.  Do not take more than one dose of ELIQUIS at the same time to make up a missed dose.  Important Safety Information A possible side effect of Eliquis is bleeding. You should call your healthcare provider right away if you experience any of the following: ? Bleeding from an injury or your nose that does not stop. ? Unusual colored urine (red or dark brown) or unusual colored stools (red or black). ? Unusual bruising for unknown reasons. ? A serious fall or if you hit your head (even if there is no bleeding).  Some medicines may interact with Eliquis and might increase your risk of bleeding or clotting while on Eliquis. To help avoid this, consult your healthcare provider or pharmacist prior to using any new prescription or non-prescription  medications, including herbals, vitamins, non-steroidal anti-inflammatory drugs (NSAIDs) and supplements.  This website has more information on Eliquis (apixaban): http://[email or web address].com/eliquis/home

## 2014-09-01 NOTE — Progress Notes (Addendum)
[facility] PROGRESS NOTE  [name] AVW:[id number] DOB: [birth year] DOA: 08/27/2014 PCP: [name], MD  Brief Summary  The patient is a 59 year old female with history of Turner syndrome, mild aortic valve insufficiency. She presented to her primary care doctor's office with respiratory distress and palpitations several days prior to admission. She was found to be in A. fib with RVR.  She received 20 mg of Cardizem which caused her systolic blood pressure did drop to the 70s systolic.  She was then transported by EMS to the emergency department.  8/24:  CT angio chest:  Lingular pneumonia, started zosyn.  Incidental pulm nodule.  Transitioned off dilt gtt 8/25:  Increasingly tachycardic but feeling better with diuresis 8/26:  CO2 elevated   Assessment/Plan  Atrial fibrillation with rapid ventricular response, Heart rate of 130-140 initially.  CHA2DS2-VASc Score and unadjusted Ischemic Stroke Rate (% per year) is equal to 2.2% stroke rate/year from a score of 2, female gender and hypertension -  Appreciate cardiology assistance -  Telemetry:  A-fib rate in 120s -  Continue diltiazem 360mg  and metoprolol -  Continue apixaban -  Patient and mother declined cardioversion -  Outpatient stress test  Moderate aortic insufficiency with acute systolic heart failure -  Initially concern for bicuspid AV, however, ECHO demonstrated tricuspid valve -  Family declined TEE at this time -  No evidence of aneurysmal dilation of thoracic aorta on CT -  Defer timing of cardiothoracic surgery consultation versus interventionalist consultation to cardiology -  Appreciate cardiology assistance  Acute hypoxic respiratory failure secondary to acute systolic heart failure, breathing initially improved this morning but she was on 4L [place] and developed worsening SOB on CPAP in evening with probable Cheyne Stokes breathing -   ABG c/w metabolic alkalosis, mild hypercarbia, and moderate to severe  hypoxemia on CPAP 6L -  Bipap being management by pulmonology, appreciate assistance -  Just had negative CT angio to rule out PE two days ago and has been on full a/c since -  Repeat CXR:  Effusions and edema improved, no clear infiltrate and still on abx -  ECHO:  Mild LVH with ejection fraction of 45-50% and diffuse hypokinesis -  Continue diamox  Aspiration pneumonia -  Transition to unasyn day 4 of abx -  S. 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  Possible pneumonia.  UCx neg.  Trending down  Hypokalemia due to diuresis, resolved with oral potassium repletion today.  Diet:  Healthy heart  Access:  PIV  IVF:  Off  Proph:  apixaban  Code Status: full code Family Communication:  Patient and her mother.   Disposition Plan:  Pending improvement in shortness of breath, stable O2 requirements   Consultants:   Cardiology, Dr. Royann Shivers  Procedures:  Echocardiogram  CT angio chest  Antibiotics:   None   HPI/Subjective:  States her breathing is better.  Had BM.  Eating well and denies nausea.  Wants to go home.  Objective: Filed Vitals:   08/31/14 2045 08/31/14 2213 09/01/14 0000 09/01/14 0452  BP: 122/76  115/78 118/70  Pulse: 81 86 76 81  Temp: 98 F (36.7 C)  98.3 F (36.8 C) 97.9 F (36.6 C)  TempSrc: Oral  Oral Axillary  Resp: 18  18 17   Height:      Weight:    65 kg (143 lb 4.8 oz)  SpO2: 94%  95% 93%    Intake/Output Summary (Last 24 hours) at  09/01/14 0717 Last data filed at 09/01/14 0455  Gross per 24 hour  Intake    460 ml  Output    975 ml  Net   -515 ml   Filed Weights   08/30/14 0557 08/31/14 0530 09/01/14 0452  Weight: 64.139 kg (141 lb 6.4 oz) 63.73 kg (140 lb 8 oz) 65 kg (143 lb 4.8 oz)   Body mass index is 32.15 kg/(m^2).  Exam:   General:  Adult female, NAD  HEENT:  NCAT, MMM, webbed neck   Cardiovascular:  IRRR, nl S1, S2 no mrg, 2+ pulses, warm extremities  Respiratory:  Rales at the left base, no wheezes or rhonchi.    Abdomen:   NABS, soft, NT/ND  MSK:   Normal tone and bulk, 1+ RLE edema  Neuro:  Grossly moves all extremities  Data Reviewed: Basic Metabolic Panel:  Recent Labs Lab 08/29/14 0800 08/30/14 0240 08/31/14 0415 08/31/14 1819 09/01/14 0455  NA 137 140 137 138 138  K 4.5 3.2* 2.8* 4.1 4.4  CL 102 96* 91* 94* 98*  CO2 25 34* 38* 37* 33*  GLUCOSE 144* 126* 114* 138* 127*  BUN 24* 13 11 16 17   CREATININE 0.91 0.73 0.71 0.78 0.75  CALCIUM 8.6* 8.5* 8.4* 8.8* 8.7*  MG  --   --  2.2  --  2.2   Liver Function Tests:  Recent Labs Lab 08/29/14 0800  AST 40  ALT 78*  ALKPHOS 115  BILITOT 0.9  PROT 5.9*  ALBUMIN 3.4*   No results for input(s): LIPASE, AMYLASE in the last 168 hours. No results for input(s): AMMONIA in the last 168 hours. CBC:  Recent Labs Lab 08/27/14 1841 08/28/14 0530 08/29/14 0800 08/30/14 0240  WBC 10.4 9.1 15.2* 13.2*  HGB 13.6 13.5 14.5 14.0  HCT 40.5 41.4 43.6 41.7  MCV 98.1 99.3 99.3 98.1  PLT 282 249 299 249    Recent Results (from the past 240 hour(s))  MRSA PCR Screening     Status: None   Collection Time: 08/27/14 11:07 PM  Result Value Ref Range Status   MRSA by PCR NEGATIVE NEGATIVE Final    Comment:        The GeneXpert MRSA Assay (FDA approved for NASAL specimens only), is one component of a comprehensive MRSA colonization surveillance program. It is not intended to diagnose MRSA infection nor to guide or monitor  treatment for MRSA infections.   Urine culture     Status: None   Collection Time: 08/30/14  8:43 AM  Result Value Ref Range Status   Specimen Description URINE, RANDOM  Final   Special Requests Normal  Final   Culture NO GROWTH 1 DAY  Final   Report Status 08/31/2014 FINAL  Final     Studies: Dg Chest Port 1 View  08/31/2014   CLINICAL DATA:  Hypoxia  EXAM: PORTABLE CHEST - 1 VIEW  COMPARISON:  08/29/2014  FINDINGS: Moderate enlargement of the cardiopericardial silhouette, stable. No mediastinal or hilar masses  or evidence of adenopathy.  Mild opacity noted at the right lung base likely combination of atelectasis and a small pleural effusion. No evidence of pulmonary edema. No pneumonia. No pneumothorax.  IMPRESSION: 1. Resolved pulmonary edema. 2. No new lung abnormalities.   Electronically Signed   By: Amie Portland M.D.   On: 08/31/2014 17:46    Scheduled Meds: . acetaZOLAMIDE  250 mg Intravenous BID  . ampicillin-sulbactam (UNASYN) IV  3 g Intravenous Q6H  . apixaban  5 mg Oral BID  . diltiazem  360 mg Oral Daily  . furosemide  40 mg Oral Daily  . levothyroxine  88 mcg Oral QAC breakfast  . metoprolol tartrate  12.5 mg Oral BID   Continuous Infusions:    Principal Problem:   Atrial fibrillation with RVR Active Problems:   Benign hypertensive heart disease without heart failure   Turner syndrome   Aortic insufficiency   Hypertension   OSA (obstructive sleep apnea)   New onset a-fib   Acute respiratory failure with hypoxia   Acute systolic congestive heart failure   Leukocytosis   Aspiration pneumonia   Pulmonary nodule   LAD (lymphadenopathy), mediastinal   Dyspnea    Time spent: 30 min    Nevaan Bunton  [facility] Pager [phone]. If 7PM-7AM, please contact night-coverage at [email or web address], password [facility] 09/01/2014, 7:17 AM  LOS: 5 days

## 2014-09-01 NOTE — Progress Notes (Signed)
Pt had intermittent timed apneic pauses lasting from 12 to 26 seconds during night while on CPAP at 12/6 setting. No visible distress or signs of cyanosis

## 2014-09-02 DIAGNOSIS — J9622 Acute and chronic respiratory failure with hypercapnia: Secondary | ICD-10-CM

## 2014-09-02 DIAGNOSIS — J9621 Acute and chronic respiratory failure with hypoxia: Secondary | ICD-10-CM

## 2014-09-02 LAB — BASIC METABOLIC PANEL
ANION GAP: 6 (ref 5–15)
BUN: 21 mg/dL — ABNORMAL HIGH (ref 6–20)
CHLORIDE: 99 mmol/L — AB (ref 101–111)
CO2: 31 mmol/L (ref 22–32)
Calcium: 8.8 mg/dL — ABNORMAL LOW (ref 8.9–10.3)
Creatinine, Ser: 0.73 mg/dL (ref 0.44–1.00)
GFR calc Af Amer: >60 mL/min (ref >60)
GFR calc non Af Amer: >60 mL/min (ref >60)
GLUCOSE: 120 mg/dL — AB (ref 65–99)
POTASSIUM: 4.4 mmol/L (ref 3.5–5.1)
Sodium: 136 mmol/L (ref 135–145)

## 2014-09-02 LAB — MAGNESIUM: Magnesium: 2.4 mg/dL (ref 1.7–2.4)

## 2014-09-02 NOTE — Progress Notes (Signed)
SATURATION QUALIFICATIONS: (This note is used to comply with regulatory documentation for home oxygen)  Patient Saturations on Room Air at Rest = 91%  Patient Saturations on Room Air while Ambulating = 84%  Patient Saturations on 3 Liters of oxygen while Ambulating = 92%  Please briefly explain why patient needs home oxygen: Once we arrived back to the room off of 02 pt sats where 88%, for about five minutes and then they went to 93%, RN placed pt on 3l instead of 4, will continue to monitor and wean if sats permit.

## 2014-09-02 NOTE — Progress Notes (Signed)
Name: [name] MRN: [id number] DOB: May 01, 1955    ADMISSION DATE:  08/27/2014 CONSULTATION DATE:  09/02/2014  REFERRING MD :  Malachi Bonds [facility]  CHIEF COMPLAINT:   Progressive dyspnea   BRIEF PATIENT DESCRIPTION:  59 year old female primary patient of Dr. Shelle Iron with severe sleep apnea on C Pap at home Admitted 08/27/2014 with acute pulmonary edema and A. fib with RVR. Pulmonary asked to comment on hypercarbia and C Pap therapy.  There is also been a question of aspiration and inability protect the airway  SIGNIFICANT EVENTS    STUDIES:  CT scan of chest did not show pulmonary emboli but showed bilateral pulmonary edema and pleural effusions, and left lung pneumonia    SUBJECTIVE:  More alert. Less dyspneic  VITAL SIGNS: Temp:  [97.2 F (36.2 C)-98.9 F (37.2 C)] 98.9 F (37.2 C) (08/28 0001) Pulse Rate:  [55-69] 55 (08/28 0549) Resp:  [18-22] 18 (08/28 0549) BP: (97-113)/(54-68) 97/68 mmHg (08/28 0549) SpO2:  [94 %-97 %] 94 % (08/28 0220) Weight:  [66.407 kg (146 lb 6.4 oz)] 66.407 kg (146 lb 6.4 oz) (08/28 0549)  PHYSICAL EXAMINATION: General: awake alert sitting in chair Neuro:  No focal deficits HEENT:   turners syndrome facies Cardiovascular:  Irregular regular normal S1-S2 Lungs:  Distant breath sounds Abdomen:  Soft nontender bowel sounds active no organomegaly Musculoskeletal:  Full range of motion no joint deformity Skin:  Clear   Recent Labs Lab 08/31/14 1819 09/01/14 0455 09/02/14 0342  NA 138 138 136  K 4.1 4.4 4.4  CL 94* 98* 99*  CO2 37* 33* 31  BUN 16 17 21*  CREATININE 0.78 0.75 0.73  GLUCOSE 138* 127* 120*    Recent Labs Lab 08/28/14 0530 08/29/14 0800 08/30/14 0240  HGB 13.5 14.5 14.0  HCT 41.4 43.6 41.7  WBC 9.1 15.2* 13.2*  PLT 249 299 249   Dg Chest Port 1 View  08/31/2014   CLINICAL DATA:  Hypoxia  EXAM: PORTABLE CHEST - 1 VIEW  COMPARISON:  08/29/2014  FINDINGS: Moderate enlargement of the cardiopericardial silhouette,  stable. No mediastinal or hilar masses or evidence of adenopathy.  Mild opacity noted at the right lung base likely combination of atelectasis and a small pleural effusion. No evidence of pulmonary edema. No pneumonia. No pneumothorax.  IMPRESSION: 1. Resolved pulmonary edema. 2. No new lung abnormalities.   Electronically Signed   By: Amie Portland M.D.   On: 08/31/2014 17:46    ASSESSMENT / PLAN:  #1 obstructive sleep apnea with significant poor respiratory drive and acute on chronic hypercarbic and hypoxic respiratory failure.     Improved with bipap settings. I will order cpap changed to bipap on d/c  Need to determine oxygen needs at home   Recommendations   Cont  BiPAP with oxygen supplementation. The patient should wear this    during sleep at night and as needed during daytime   Pulmonary will follow-up with you during this admission and help coordinate the    ordering of the bilevel support at home   No indication for bronchial dilator therapy at this time   The patient will likely need oxygen therapy during the day as well at home, will assess   Continue to keep the patient's lungs dry with diuresis as indicated  #2 aspiration pneumonia   This appears to be a real diagnosis and I do agree with current antibiotics coverage   Shan Levans MD Beeper  [phone]  Cell  [phone]  If no  response or cell goes to voicemail, call beeper [phone]  Pulmonary and Critical Care Medicine [facility] Pager: [phone]  09/02/2014, 10:59 AM

## 2014-09-02 NOTE — Progress Notes (Signed)
[facility] PROGRESS NOTE  ARBOR LEER JWJ:[id number] DOB: [birth year] DOA: 08/27/2014 PCP: [name], MD  Brief Summary  The patient is a 59 year old female with history of Turner syndrome, mild aortic valve insufficiency. She presented to her primary care doctor's office with respiratory distress and palpitations several days prior to admission. She was found to be in A. fib with RVR.  She received 20 mg of Cardizem which caused her systolic blood pressure did drop to the 70s systolic.  She was then transported by EMS to the emergency department.  8/24:  CT angio chest:  Lingular pneumonia, started zosyn.  Incidental pulm nodule.  Transitioned off dilt gtt 8/25:  Increasingly tachycardic but feeling better with diuresis 8/26:  CO2 elevated and increased hypoxia.  Bipap, diamox started 8/27:  pulm consulted, bipap adjusted 8/28:  D/c diamox.  O2 weaned to 3L  Assessment/Plan  Atrial fibrillation with rapid ventricular response, Heart rate of 130-140 initially.  CHA2DS2-VASc Score and unadjusted Ischemic Stroke Rate (% per year) is equal to 2.2% stroke rate/year from a score of 2, female gender and hypertension -  Appreciate cardiology assistance -  Telemetry:  A-fib rate in 120s -  Continue diltiazem 360mg  and metoprolol -  Continue apixaban -  Patient and mother declined cardioversion -  Outpatient stress test  Moderate aortic insufficiency with acute systolic heart failure, appears euvolemic -  Initially concern for bicuspid AV, however, ECHO demonstrated tricuspid valve -  Family declined TEE at this time -  No evidence of aneurysmal dilation of thoracic aorta on CT -  Defer timing of cardiothoracic surgery consultation versus interventionalist consultation to cardiology -  Appreciate cardiology assistance  Acute hypoxic respiratory failure secondary to acute systolic heart failure and aspiration pneumonia, appears euvolemic, O2 weaned to 3L -  Just had negative CT  angio to rule out PE two days ago and has been on full a/c since -  CXR:  Effusions and edema improved, no clear infiltrate and still on abx -  ECHO:  Mild LVH with ejection fraction of 45-50% and diffuse hypokinesis -  D/c diamox and continue oral lasix  OSA with significant hypoxic events even on bipap -  Ongoing bipap adjustments by pulmonology, appreciate assistance  Aspiration pneumonia -  Transition to unasyn day 5 of 7 of abx -  S. pneumo neg and legionella neg  Pulmonary nodule and mild mediastinal lymphadenopathy probably secondary to edema and pneumonia  -  Will need repeat CT chest in 6 months  Hypertension, BP low normal  -  Adding metoprolol so monitor closely  Turner syndrome, with some typical features such as webbed neck, Simranjit Thayer stature, shield chest, but she does not appear to have a bicuspid aortic valve  Hypothyroidism, TSH 0.370 borderline low and free T4 mildly elevated at 1.43 -  Decrease to synthroid -  Repeat TFTs in 4 weeks  Leukocytosis, afebrile.  Possible pneumonia.  UCx neg.  Trended down  Hypokalemia due to diuresis, resolved with oral potassium repletion.  Diet:  Healthy heart  Access:  PIV  IVF:  Off  Proph:  apixaban  Code Status: full code Family Communication:  Patient alone this morning.     Disposition Plan:  Possible discharge on Monday with bipap for home on probably 3L [place] during day   Consultants:   Cardiology, Dr. Royann Shivers  Procedures:  Echocardiogram  CT angio chest  CXR  Antibiotics:   None   HPI/Subjective:  States her breathing is better but still  concerned about her night time low O2 sat  Objective: Filed Vitals:   09/01/14 2122 09/02/14 0001 09/02/14 0220 09/02/14 0549  BP:  109/54  97/68  Pulse: 62 67 66 55  Temp:  98.9 F (37.2 C)    TempSrc:  Oral    Resp:  18  18  Height:      Weight:    66.407 kg (146 lb 6.4 oz)  SpO2: 97% 97% 94%     Intake/Output Summary (Last 24 hours) at 09/02/14  1248 Last data filed at 09/02/14 0424  Gross per 24 hour  Intake      0 ml  Output     75 ml  Net    -75 ml   Filed Weights   08/31/14 0530 09/01/14 0452 09/02/14 0549  Weight: 63.73 kg (140 lb 8 oz) 65 kg (143 lb 4.8 oz) 66.407 kg (146 lb 6.4 oz)   Body mass index is 32.84 kg/(m^2).  Exam:   General:  Adult female, NAD  HEENT:  NCAT, MMM, webbed neck   Cardiovascular:  IRRR, nl S1, S2 no mrg, 2+ pulses, warm extremities  Respiratory:  Persistent rales at the left base, no wheezes or rhonchi.    Abdomen:   NABS, soft, NT/ND  MSK:   Normal tone and bulk, 1+ RLE edema  Neuro:  Grossly moves all extremities  Data Reviewed: Basic Metabolic Panel:  Recent Labs Lab 08/30/14 0240 08/31/14 0415 08/31/14 1819 09/01/14 0455 09/02/14 0342  NA 140 137 138 138 136  K 3.2* 2.8* 4.1 4.4 4.4  CL 96* 91* 94* 98* 99*  CO2 34* 38* 37* 33* 31  GLUCOSE 126* 114* 138* 127* 120*  BUN 13 11 16 17  21*  CREATININE 0.73 0.71 0.78 0.75 0.73  CALCIUM 8.5* 8.4* 8.8* 8.7* 8.8*  MG  --  2.2  --  2.2 2.4   Liver Function Tests:  Recent Labs Lab 08/29/14 0800  AST 40  ALT 78*  ALKPHOS 115  BILITOT 0.9  PROT 5.9*  ALBUMIN 3.4*   No results for input(s): LIPASE, AMYLASE in the last 168 hours. No results for input(s): AMMONIA in the last 168 hours. CBC:  Recent Labs Lab 08/27/14 1841 08/28/14 0530 08/29/14 0800 08/30/14 0240  WBC 10.4 9.1 15.2* 13.2*  HGB 13.6 13.5 14.5 14.0  HCT 40.5 41.4 43.6 41.7  MCV 98.1 99.3 99.3 98.1  PLT 282 249 299 249    Recent Results (from the past 240 hour(s))  MRSA PCR Screening     Status: None   Collection Time: 08/27/14 11:07 PM  Result Value Ref Range Status   MRSA by PCR NEGATIVE NEGATIVE Final    Comment:        The GeneXpert MRSA Assay (FDA approved for NASAL specimens only), is one component of a comprehensive MRSA colonization surveillance program. It is not intended to diagnose MRSA infection nor to guide or monitor  treatment for MRSA infections.   Urine culture     Status: None   Collection Time: 08/30/14  8:43 AM  Result Value Ref Range Status   Specimen Description URINE, RANDOM  Final   Special Requests Normal  Final   Culture NO GROWTH 1 DAY  Final   Report Status 08/31/2014 FINAL  Final     Studies: Dg Chest Port 1 View  08/31/2014   CLINICAL DATA:  Hypoxia  EXAM: PORTABLE CHEST - 1 VIEW  COMPARISON:  08/29/2014  FINDINGS: Moderate enlargement of the cardiopericardial silhouette,  stable. No mediastinal or hilar masses or evidence of adenopathy.  Mild opacity noted at the right lung base likely combination of atelectasis and a small pleural effusion. No evidence of pulmonary edema. No pneumonia. No pneumothorax.  IMPRESSION: 1. Resolved pulmonary edema. 2. No new lung abnormalities.   Electronically Signed   By: Amie Portland M.D.   On: 08/31/2014 17:46    Scheduled Meds: . ampicillin-sulbactam (UNASYN) IV  3 g Intravenous Q6H  . apixaban  5 mg Oral BID  . diltiazem  360 mg Oral Daily  . furosemide  40 mg Oral Daily  . levothyroxine  88 mcg Oral QAC breakfast  . metoprolol tartrate  12.5 mg Oral BID   Continuous Infusions:    Principal Problem:   Atrial fibrillation with RVR Active Problems:   Benign hypertensive heart disease without heart failure   Turner syndrome   Aortic insufficiency   Hypertension   OSA (obstructive sleep apnea)   New onset a-fib   Acute and chronic respiratory failure with hypoxia   Acute systolic congestive heart failure   Leukocytosis   Aspiration pneumonia   Pulmonary nodule   LAD (lymphadenopathy), mediastinal   Dyspnea   Acute on chronic respiratory failure with hypercapnia    Time spent: 30 min    Quirino Kakos  [facility] Pager [phone]. If 7PM-7AM, please contact night-coverage at [email or web address], password [facility] 09/02/2014, 12:48 PM  LOS: 6 days

## 2014-09-02 NOTE — Progress Notes (Signed)
Increased settings per RN informing RT of pt desat periods. Pt tolerating well at this time

## 2014-09-02 NOTE — Progress Notes (Signed)
Patient Name: [name] Date of Encounter: 09/02/2014  Primary cardiologist: Dr. Patty Sermons   Principal Problem:   Atrial fibrillation with RVR Active Problems:   Benign hypertensive heart disease without heart failure   Turner syndrome   Aortic insufficiency   Hypertension   OSA (obstructive sleep apnea)   New onset a-fib   Acute and chronic respiratory failure with hypoxia   Acute systolic congestive heart failure   Leukocytosis   Aspiration pneumonia   Pulmonary nodule   LAD (lymphadenopathy), mediastinal   Dyspnea   Acute on chronic respiratory failure with hypercapnia    SUBJECTIVE  Denies any CP. Still a little SOB. Family at bedside  CURRENT MEDS . ampicillin-sulbactam (UNASYN) IV  3 g Intravenous Q6H  . apixaban  5 mg Oral BID  . diltiazem  360 mg Oral Daily  . furosemide  40 mg Oral Daily  . levothyroxine  88 mcg Oral QAC breakfast  . metoprolol tartrate  12.5 mg Oral BID    OBJECTIVE  Filed Vitals:   09/01/14 2122 09/02/14 0001 09/02/14 0220 09/02/14 0549  BP:  109/54  97/68  Pulse: 62 67 66 55  Temp:  98.9 F (37.2 C)    TempSrc:  Oral    Resp:  18  18  Height:      Weight:    146 lb 6.4 oz (66.407 kg)  SpO2: 97% 97% 94%     Intake/Output Summary (Last 24 hours) at 09/02/14 0958 Last data filed at 09/02/14 0424  Gross per 24 hour  Intake      0 ml  Output     75 ml  Net    -75 ml   Filed Weights   08/31/14 0530 09/01/14 0452 09/02/14 0549  Weight: 140 lb 8 oz (63.73 kg) 143 lb 4.8 oz (65 kg) 146 lb 6.4 oz (66.407 kg)    PHYSICAL EXAM  General: Pleasant, NAD. Neuro: Alert and oriented X 3. Moves all extremities spontaneously. Psych: Normal affect. HEENT:  Normal  Neck: Supple without bruits. Unable to assess JVD Lungs:  Resp regular and unlabored. Difficult to hear breath sound.  Heart: irregular. no s3, s4, or murmurs. Abdomen: Soft, non-tender, non-distended, BS + x 4.  Extremities: No clubbing, cyanosis or edema.  DP/PT/Radials 2+ and equal bilaterally.  Accessory Clinical Findings  Basic Metabolic Panel  Recent Labs  09/01/14 0455 09/02/14 0342  NA 138 136  K 4.4 4.4  CL 98* 99*  CO2 33* 31  GLUCOSE 127* 120*  BUN 17 21*  CREATININE 0.75 0.73  CALCIUM 8.7* 8.8*  MG 2.2 2.4   Cardiac Enzymes  Recent Labs  08/31/14 1819 08/31/14 2316 09/01/14 0455  TROPONINI <0.03 <0.03 <0.03    TELE A-fib with HR 60-70s    ECG  No new EKG  Echocardiogram 08/28/2014  LV EF: 45% -  50%  ------------------------------------------------------------------- Indications:   Atrial fibrillation - 427.31.  ------------------------------------------------------------------- History:  PMH: Hypothyroidism. Dermatitis. Bilateral leg edema. Dyspnea. Risk factors: Hypertension.  ------------------------------------------------------------------- Study Conclusions  - Left ventricle: The cavity size was normal. Wall thickness was increased in a pattern of mild LVH. Systolic function was mildly reduced. The estimated ejection fraction was in the range of 45% to 50%. Diffuse hypokinesis. - Aortic valve: There was moderate regurgitation. - Ascending aorta: The ascending aorta was mildly dilated. - Mitral valve: Calcified annulus. There was mild regurgitation. - Left atrium: The atrium was severely dilated. - Right ventricle: Systolic function was mildly reduced. -  Right atrium: The atrium was mildly dilated. - Pulmonary arteries: Systolic pressure was mildly increased. PA peak pressure: 43 mm Hg (S).  Impressions:  - Technically difficult due to atrial fibrillation and tachycardia; ;mild global reduction in LV function; severe LAE; mild RAE; trileaflet aortic valve with thickening; AI difficult to quantitate but at least moderate; mild MR and TR; mildly elevated pulmonary pressure; suggest FU study once patient&'s HR improved of sinus reestablished.      Radiology/Studies  Ct Angio Chest Pe W/cm &/or Wo Cm  08/29/2014   CLINICAL DATA:  Elevated D-dimer.  Extreme shortness of breath.  EXAM: CT ANGIOGRAPHY CHEST WITH CONTRAST  TECHNIQUE: Multidetector CT imaging of the chest was performed using the standard protocol during bolus administration of intravenous contrast. Multiplanar CT image reconstructions and MIPs were obtained to evaluate the vascular anatomy.  CONTRAST:  OMNIPAQUE IOHEXOL 350 MG/ML SOLN  COMPARISON:  Chest x-ray 08/29/2014  FINDINGS: Aberrant venous anatomy. The left innominate vein is not visualized. 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 Good opacification of pulmonary arteries. No filling defects in the pulmonary arteries to suggest pulmonary emboli. The aorta is un opacified. Tortuosity of the aorta with calcifications in the aortic arch and descending thoracic aorta. No evidence of aortic aneurysm. Maximum diameter 3.6 cm in the proximal descending thoracic aorta. The aorta is not opacified and. Therefore, cannot evaluate for dissection.  There are enlarged mediastinal lymph nodes. AP window lymph node has a short axis diameter of 19 mm. Prevascular node has a short axis diameter of 11 mm. Right peritracheal node has a short axis diameter of 13 mm. No hilar or axillary adenopathy.  There is cardiomegaly. There are small bilateral pleural effusions. Vascular congestion noted. There is dependent opacities in the lower lobes, likely compressive atelectasis. Airspace opacity within the lingula is concerning for pneumonia. Also within the lingula peripherally is a 7 mm nodule on image 70. This may be inflammatory but warrants followup.  Chest wall soft tissues are unremarkable. Imaging into the upper abdomen shows no acute findings.  Review of the MIP images confirms the above findings.  IMPRESSION: No evidence of pulmonary embolus. No  evidence of aortic aneurysm. The aorta is un opacified and therefore dissection cannot be completely excluded.  Small bilateral pleural effusions with compressive atelectasis in the lower lobes. Area of consolidation in the lingula is concerning for pneumonia.  7 mm lingular nodule which could be inflammatory but warrants followup. If the patient is at high risk for bronchogenic carcinoma, follow-up chest CT at 3-32months is recommended. If the patient is at low risk for bronchogenic carcinoma, follow-up chest CT at 6-12 months is recommended. This recommendation follows the consensus statement: Guidelines for Management of Small Pulmonary Nodules Detected on CT Scans: A Statement from the Fleischner Society as published in Radiology 2005; 237:395-400.  Cardiomegaly, vascular congestion.  Mild mediastinal adenopathy. Recommend attention on followup imaging.   Electronically Signed   By: Charlett Nose M.D.   On: 08/29/2014 11:18   Dg Chest Port 1 View  08/31/2014   CLINICAL DATA:  Hypoxia  EXAM: PORTABLE CHEST - 1 VIEW  COMPARISON:  08/29/2014  FINDINGS: Moderate enlargement of the cardiopericardial silhouette, stable. No mediastinal or hilar masses or evidence of adenopathy.  Mild opacity noted at the right lung base likely combination of atelectasis and a small pleural effusion. No evidence of pulmonary edema. No pneumonia. No pneumothorax.  IMPRESSION:  1. Resolved pulmonary edema. 2. No new lung abnormalities.   Electronically Signed   By: Amie Portland M.D.   On: 08/31/2014 17:46   Dg Chest Port 1 View  08/29/2014   CLINICAL DATA:  Dyspnea today.  EXAM: PORTABLE CHEST - 1 VIEW  COMPARISON:  Single view of the chest 08/27/2014. PA and lateral chest 08/15/2012.  FINDINGS: There is cardiomegaly and pulmonary edema. Small bilateral pleural effusions are seen. No pneumothorax. Basilar atelectasis noted.  IMPRESSION: Cardiomegaly and pulmonary edema with small bilateral pleural effusions.   Electronically Signed    By: Drusilla Kanner M.D.   On: 08/29/2014 10:14   Dg Chest Port 1 View  08/27/2014   CLINICAL DATA:  Shortness of breath  EXAM: PORTABLE CHEST - 1 VIEW  COMPARISON:  08/15/2012  FINDINGS: Cardiomegaly with pulmonary vascular congestion. No frank interstitial edema. Mild right basilar atelectasis. No pleural effusion or pneumothorax.  IMPRESSION: Cardiomegaly with pulmonary vascular congestion. No frank interstitial edema.   Electronically Signed   By: Charline Bills M.D.   On: 08/27/2014 17:48    ASSESSMENT AND PLAN  1. Rapid atrial fibrillation on eliquis  - focused on rate control for now. Severely dilated LA on echo.   - rate controlled on telemetry, continue BB and dilatiazem.              - Likely can be discharged soon.   2. Moderate aortic insufficiency with LVEF 45-50%: Tricuspid AV with mild aortic root dilatation by echo. Consider outpatient TEE to better characterize severity of regurgitation.  3. OSA on CPAP with witenessed apneic episodes: on CPAP  4. Acute systolic HF, LVEF 45-50%  - difficult to assess based on physical exam, weight and I/O does not seems accurate, CXR 2 days ago showed resolution of HF  - placed on PO lasix. Nurse to start weaning O2, likely need home O2.  5. Turner syndrome  Signed, Amedeo Plenty Pager: [phone]  The patient was seen and examined, and I agree with the assessment and plan as documented above, with modifications as noted below. Feeling about the same as yesterday. Pulmonary evaluating for home oxygen.  1. Rate controlled atrial fibrillation. On apixaban. Given sleep apnea and severe left atrial enlargement, I doubt ability to maintain sinus rhythm for any extended period of time. I think a rate control with anticoagulation strategy is likely to be most successful.  2. Aortic regurgitation, LVEF 45-50%: Consider outpatient TEE to better characterize severity of aortic regurgitation.  3. OSA: Contributing to atrial  fibrillation.  4. Acute systolic heart failure, LVEF 45-50%: Possibly tachycardia-mediated given diffuse hypokinesis. Would expect aortic regurgitation contribution if it had been severely regurgitant. Consider outpatient TEE to better characterize severity of regurgitation. Would also consider outpatient nuclear stress test to rule out ischemic etiology.  Should be able to discharge soon.

## 2014-09-03 LAB — BASIC METABOLIC PANEL
ANION GAP: 7 (ref 5–15)
BUN: 17 mg/dL (ref 6–20)
CHLORIDE: 99 mmol/L — AB (ref 101–111)
CO2: 32 mmol/L (ref 22–32)
Calcium: 8.5 mg/dL — ABNORMAL LOW (ref 8.9–10.3)
Creatinine, Ser: 0.73 mg/dL (ref 0.44–1.00)
GFR calc non Af Amer: >60 mL/min (ref >60)
Glucose, Bld: 156 mg/dL — ABNORMAL HIGH (ref 65–99)
Potassium: 3.5 mmol/L (ref 3.5–5.1)
Sodium: 138 mmol/L (ref 135–145)

## 2014-09-03 LAB — CBC
HCT: 43.5 % (ref 36.0–46.0)
HEMOGLOBIN: 14 g/dL (ref 12.0–15.0)
MCH: 32.3 pg (ref 26.0–34.0)
MCHC: 32.2 g/dL (ref 30.0–36.0)
MCV: 100.2 fL — ABNORMAL HIGH (ref 78.0–100.0)
Platelets: 242 10*3/uL (ref 150–400)
RBC: 4.34 MIL/uL (ref 3.87–5.11)
RDW: 15.1 % (ref 11.5–15.5)
WBC: 12.6 10*3/uL — ABNORMAL HIGH (ref 4.0–10.5)

## 2014-09-03 LAB — MAGNESIUM: Magnesium: 2.2 mg/dL (ref 1.7–2.4)

## 2014-09-03 MED ORDER — LEVOTHYROXINE SODIUM 88 MCG PO TABS: 88.0000 ug | ORAL_TABLET | Freq: Every day | ORAL | Status: DC

## 2014-09-03 MED ORDER — METOPROLOL TARTRATE 25 MG PO TABS: 12.5000 mg | ORAL_TABLET | Freq: Two times a day (BID) | ORAL | Status: DC

## 2014-09-03 MED ORDER — AMOXICILLIN-POT CLAVULANATE 875-125 MG PO TABS: 1.0000 | ORAL_TABLET | Freq: Two times a day (BID) | ORAL | Status: DC

## 2014-09-03 MED ORDER — DILTIAZEM HCL ER COATED BEADS 360 MG PO CP24: 360.0000 mg | ORAL_CAPSULE | Freq: Every day | ORAL | Status: DC

## 2014-09-03 MED ORDER — DILTIAZEM HCL ER COATED BEADS 180 MG PO CP24
360.0000 mg | ORAL_CAPSULE | Freq: Every day | ORAL | Status: DC
  Administered 2014-09-03 – 2014-09-04 (×2): 360 mg via ORAL
  Filled 2014-09-03 (×2): qty 2

## 2014-09-03 MED ORDER — APIXABAN 5 MG PO TABS: 5.0000 mg | ORAL_TABLET | Freq: Two times a day (BID) | ORAL | Status: DC

## 2014-09-03 MED ORDER — AMOXICILLIN-POT CLAVULANATE 875-125 MG PO TABS
1.0000 | ORAL_TABLET | Freq: Two times a day (BID) | ORAL | Status: DC
  Administered 2014-09-03 – 2014-09-04 (×2): 1 via ORAL
  Filled 2014-09-03 (×2): qty 1

## 2014-09-03 NOTE — Progress Notes (Signed)
[facility] PROGRESS NOTE  [name]:[id number] DOB: 1955-05-08 DOA: 08/27/2014 PCP: [name], MD  Brief Summary  The patient is a 59 year old female with history of Turner syndrome, mild aortic valve insufficiency. She presented to her primary care doctor's office with respiratory distress and palpitations several days prior to admission. She was found to be in A. fib with RVR.  She received 20 mg of Cardizem which caused her systolic blood pressure did drop to the 70s systolic.  She was then transported by EMS to the emergency department.  8/24:  CT angio chest:  Lingular pneumonia, started zosyn.  Incidental pulm nodule.  Transitioned off dilt gtt 8/25:  Increasingly tachycardic but feeling better with diuresis 8/26:  CO2 elevated and increased hypoxia.  Bipap, diamox started 8/27:  pulm consulted, bipap adjusted 8/28:  D/c diamox.  O2 weaned to 3L 8/29:  Required maximal bipap settings overnight, too high to go home on.  Pulm to wean down as titrated tonight  Assessment/Plan  Atrial fibrillation with rapid ventricular response, Heart rate of 130-140 initially.  CHA2DS2-VASc Score and unadjusted Ischemic Stroke Rate (% per year) is equal to 2.2% stroke rate/year from a score of 2, female gender and hypertension -  Appreciate cardiology assistance -  Telemetry:  A-fib rate in 120s -  Continue diltiazem 360mg  and metoprolol -  Continue apixaban -  Patient and mother declined cardioversion -  Outpatient stress test  Moderate aortic insufficiency with acute systolic heart failure, appears euvolemic -  Initially concern for bicuspid AV, however, ECHO demonstrated tricuspid valve -  Family declined TEE at this time -  No evidence of aneurysmal dilation of thoracic aorta on CT -  Defer timing of cardiothoracic surgery consultation versus interventionalist consultation to cardiology -  Appreciate cardiology assistance  Acute hypoxic respiratory failure secondary to  acute systolic heart failure and aspiration pneumonia, appears euvolemic, O2 weaned to 3L -  Just had negative CT angio to rule out PE two days ago and has been on full a/c since -  CXR:  Effusions and edema improved, no clear infiltrate and still on abx -  ECHO:  Mild LVH with ejection fraction of 45-50% and diffuse hypokinesis -  D/c diamox and continue oral lasix  OSA with significant hypoxic events even on bipap -  Ongoing bipap adjustments by pulmonology, appreciate assistance  Aspiration pneumonia -  Transition to unasyn day 6 of 7 of abx -  S. pneumo neg and legionella neg -  SLP consult:  Appreciate recs  Pulmonary nodule and mild mediastinal lymphadenopathy probably secondary to edema and pneumonia  -  Will need repeat CT chest in 6 months  Hypertension, BP low normal  -  Continue metoprolol so monitor closely  Turner syndrome, with some typical features such as webbed neck, Solash Tullo stature, shield chest, but she does not appear to have a bicuspid aortic valve  Hypothyroidism, TSH 0.370 borderline low and free T4 mildly elevated at 1.43 -  Decrease to synthroid -  Repeat TFTs in 4 weeks  Leukocytosis, afebrile.  Possible pneumonia.  UCx neg.  Trended down  Hypokalemia due to diuresis, resolved with oral potassium repletion.  Diet:  Healthy heart  Access:  PIV  IVF:  Off  Proph:  apixaban  Code Status: full code Family Communication:  Patient alone this morning.     Disposition Plan:  Need to adjust bipap settings tonight, but likely home tomorrow   Consultants:   Cardiology, Dr. Royann Shivers  Pulm:  Drs. Delford Field and Marchelle Gearing  Procedures:  Echocardiogram  CT angio chest  CXR  Antibiotics:   None   HPI/Subjective:  Increased to bipap 20/10 with R 20 and 15L last night which helped but settings are too high to be discharged on.  Otherwise feeling well and wants to gone home.    Objective: Filed Vitals:   09/03/14 0400 09/03/14 0500 09/03/14 0600  09/03/14 0927  BP:  117/62  119/62  Pulse: 56 51 49 58  Temp:  97.9 F (36.6 C)    TempSrc:  Axillary    Resp:  16    Height:      Weight:  65.862 kg (145 lb 3.2 oz)    SpO2: 98% 95% 97% 95%    Intake/Output Summary (Last 24 hours) at 09/03/14 1321 Last data filed at 09/03/14 0928  Gross per 24 hour  Intake   1260 ml  Output    350 ml  Net    910 ml   Filed Weights   09/01/14 0452 09/02/14 0549 09/03/14 0500  Weight: 65 kg (143 lb 4.8 oz) 66.407 kg (146 lb 6.4 oz) 65.862 kg (145 lb 3.2 oz)   Body mass index is 32.57 kg/(m^2).  Exam:   General:  Adult female, NAD  HEENT:  NCAT, MMM, webbed neck   Cardiovascular:  IRRR, nl S1, S2 no mrg, 2+ pulses, warm extremities  Respiratory:  Mildly diminished at the left base, but clearing, and no wheezes or rhonchi.    Abdomen:   NABS, soft, NT/ND  MSK:   Normal tone and bulk, 1+ RLE edema  Neuro:  Grossly moves all extremities  Data Reviewed: Basic Metabolic Panel:  Recent Labs Lab 08/31/14 0415 08/31/14 1819 09/01/14 0455 09/02/14 0342 09/03/14 0243  NA 137 138 138 136 138  K 2.8* 4.1 4.4 4.4 3.5  CL 91* 94* 98* 99* 99*  CO2 38* 37* 33* 31 32  GLUCOSE 114* 138* 127* 120* 156*  BUN 11 16 17  21* 17  CREATININE 0.71 0.78 0.75 0.73 0.73  CALCIUM 8.4* 8.8* 8.7* 8.8* 8.5*  MG 2.2  --  2.2 2.4 2.2   Liver Function Tests:  Recent Labs Lab 08/29/14 0800  AST 40  ALT 78*  ALKPHOS 115  BILITOT 0.9  PROT 5.9*  ALBUMIN 3.4*   No results for input(s): LIPASE, AMYLASE in the last 168 hours. No results for input(s): AMMONIA in the last 168 hours. CBC:  Recent Labs Lab 08/27/14 1841 08/28/14 0530 08/29/14 0800 08/30/14 0240  WBC 10.4 9.1 15.2* 13.2*  HGB 13.6 13.5 14.5 14.0  HCT 40.5 41.4 43.6 41.7  MCV 98.1 99.3 99.3 98.1  PLT 282 249 299 249    Recent Results (from the past 240 hour(s))  MRSA PCR Screening     Status: None   Collection Time: 08/27/14 11:07 PM  Result Value Ref Range Status   MRSA  by PCR NEGATIVE NEGATIVE Final    Comment:        The GeneXpert MRSA Assay (FDA approved for NASAL specimens only), is one component of a comprehensive MRSA colonization surveillance program. It is not intended to diagnose MRSA infection nor to guide or monitor treatment for MRSA infections.   Urine culture     Status: None   Collection Time: 08/30/14  8:43 AM  Result Value Ref Range Status   Specimen Description URINE, RANDOM  Final   Special Requests Normal  Final   Culture NO GROWTH 1 DAY  Final  Report Status 08/31/2014 FINAL  Final     Studies: No results found.  Scheduled Meds: . ampicillin-sulbactam (UNASYN) IV  3 g Intravenous Q6H  . apixaban  5 mg Oral BID  . diltiazem  360 mg Oral Daily  . furosemide  40 mg Oral Daily  . levothyroxine  88 mcg Oral QAC breakfast  . metoprolol tartrate  12.5 mg Oral BID   Continuous Infusions:    Principal Problem:   Atrial fibrillation with RVR Active Problems:   Benign hypertensive heart disease without heart failure   Turner syndrome   Aortic insufficiency   Hypertension   OSA (obstructive sleep apnea)   New onset a-fib   Acute and chronic respiratory failure with hypoxia   Acute systolic congestive heart failure   Leukocytosis   Aspiration pneumonia   Pulmonary nodule   LAD (lymphadenopathy), mediastinal   Dyspnea   Adult hypothyroidism   Turner's syndrome   Acute on chronic respiratory failure with hypercapnia    Time spent: 30 min    Jazzie Trampe  [facility] Pager [phone]. If 7PM-7AM, please contact night-coverage at [email or web address], password [facility] 09/03/2014, 1:21 PM  LOS: 7 days

## 2014-09-03 NOTE — Progress Notes (Signed)
   Name: [name] MRN: [id number] DOB: 1955/10/04    ADMISSION DATE:  08/27/2014 CONSULTATION DATE:  09/03/2014  REFERRING MD :  Malachi Bonds TRH  CHIEF COMPLAINT:   Progressive dyspnea   BRIEF PATIENT DESCRIPTION:  59 year old female primary patient of Dr. Shelle Iron with severe sleep apnea on C Pap at home Admitted 08/27/2014 with acute pulmonary edema and A. fib with RVR. Pulmonary asked to comment on hypercarbia and C Pap therapy.  There is also been a question of aspiration and inability protect the airway  SIGNIFICANT EVENTS    STUDIES:  CT scan of chest did not show pulmonary emboli but showed bilateral pulmonary edema and pleural effusions, and left lung pneumonia  09/02/14:More alert. Less dyspneic    SUBJECTIVE/OVERNIGHT/INTERVAL HX 09/03/14 - Mom reports no issues. Denies questions. RN says overnight patient did get BiPAP. She is feeling well. Primary MD wondering about discharge home  VITAL SIGNS: Temp:  [97.9 F (36.6 C)-98.7 F (37.1 C)] 97.9 F (36.6 C) (08/29 0500) Pulse Rate:  [49-91] 58 (08/29 0927) Resp:  [16-18] 16 (08/29 0500) BP: (110-119)/(58-68) 119/62 mmHg (08/29 0927) SpO2:  [90 %-98 %] 95 % (08/29 0927) Weight:  [65.862 kg (145 lb 3.2 oz)] 65.862 kg (145 lb 3.2 oz) (08/29 0500)  PHYSICAL EXAMINATION: General: awake alert sitting in bed Neuro:  No focal deficits HEENT:   turners syndrome facies. Mallampatti class 4, Short big neck with lot of adipose Cardiovascular:  Irregular regular normal S1-S2 Lungs:  Distant breath sounds Abdomen:  Soft nontender bowel sounds active no organomegaly Musculoskeletal:  Full range of motion no joint deformity Skin:  Clear   Recent Labs Lab 09/01/14 0455 09/02/14 0342 09/03/14 0243  NA 138 136 138  K 4.4 4.4 3.5  CL 98* 99* 99*  CO2 33* 31 32  BUN 17 21* 17  CREATININE 0.75 0.73 0.73  GLUCOSE 127* 120* 156*    Recent Labs Lab 08/28/14 0530 08/29/14 0800 08/30/14 0240  HGB 13.5 14.5 14.0  HCT 41.4  43.6 41.7  WBC 9.1 15.2* 13.2*  PLT 249 299 249   No results found.  ASSESSMENT / PLAN:  #1 obstructive sleep apnea with significant poor respiratory drive and acute on chronic hypercarbic and hypoxic respiratory failure.    -  Subjectively  Improved with bipap settings.  Need to determine oxygen needs at home   Recommendations   Cont  BiPAP (current seettings - go home on this) with oxygen supplementation  QHS + PRN - cae mgmt consulted   O2 supplementation at home   Needs opd sleep fu and primary pulm fu - Dr Shelle Iron has left prctice - so assigned to Dr Vassie Loll     Future Appointments Date Time Provider Department Center  09/13/2014 1:45 PM Nyoka Cowden, MD [facility] None  10/19/2014 3:45 PM Cassell Clement, MD [facility]  10/24/2014 9:00 AM Oretha Milch, MD [facility] None      #2 aspiration pneumonia   Per primary team Likely real dix    PCCM will sign off  Dr. Kalman Shan, M.D., [facility].C.P Pulmonary and Critical Care Medicine Staff Physician [facility] Pager: [phone], If no answer or between  15:00h - 7:00h: call [phone]  09/03/2014 10:07 AM

## 2014-09-03 NOTE — Progress Notes (Signed)
Patient Name: [name] Date of Encounter: 09/03/2014  Principal Problem:   Atrial fibrillation with RVR Active Problems:   Benign hypertensive heart disease without heart failure   Turner syndrome   Aortic insufficiency   Hypertension   OSA (obstructive sleep apnea)   New onset a-fib   Acute and chronic respiratory failure with hypoxia   Acute systolic congestive heart failure   Leukocytosis   Aspiration pneumonia   Pulmonary nodule   LAD (lymphadenopathy), mediastinal   Dyspnea   Acute on chronic respiratory failure with hypercapnia    SUBJECTIVE  Feels better. Denies chest pain. Low oxygen saturation with ambulation at room air, being evaluating for home oxygen use.   CURRENT MEDS . ampicillin-sulbactam (UNASYN) IV  3 g Intravenous Q6H  . apixaban  5 mg Oral BID  . diltiazem  360 mg Oral Daily  . furosemide  40 mg Oral Daily  . levothyroxine  88 mcg Oral QAC breakfast  . metoprolol tartrate  12.5 mg Oral BID    OBJECTIVE  Filed Vitals:   09/03/14 0000 09/03/14 0400 09/03/14 0500 09/03/14 0600  BP:   117/62   Pulse: 58 56 51 49  Temp:   97.9 F (36.6 C)   TempSrc:   Axillary   Resp:   16   Height:      Weight:   145 lb 3.2 oz (65.862 kg)   SpO2: 91% 98% 95% 97%    Intake/Output Summary (Last 24 hours) at 09/03/14 0827 Last data filed at 09/03/14 0759  Gross per 24 hour  Intake   1880 ml  Output    350 ml  Net   1530 ml   Filed Weights   09/01/14 0452 09/02/14 0549 09/03/14 0500  Weight: 143 lb 4.8 oz (65 kg) 146 lb 6.4 oz (66.407 kg) 145 lb 3.2 oz (65.862 kg)    PHYSICAL EXAM  General: Pleasant, NAD. Neuro: Alert and oriented X 3. Moves all extremities spontaneously. Psych: Normal affect. HEENT:  Normal  Neck: Supple without bruits. Hard to assess JVD due to grith.  Lungs:  Resp regular and unlabored. Diminished breath sound bibasilar.  Heart: irregular no s3, s4, or murmurs. Abdomen: Soft, non-tender, non-distended, BS + x 4.   Extremities: No clubbing, cyanosis or edema. DP/PT/Radials 2+ and equal bilaterally.  Accessory Clinical Findings  Basic Metabolic Panel  Recent Labs  09/02/14 0342 09/03/14 0243  NA 136 138  K 4.4 3.5  CL 99* 99*  CO2 31 32  GLUCOSE 120* 156*  BUN 21* 17  CREATININE 0.73 0.73  CALCIUM 8.8* 8.5*  MG 2.4 2.2   Cardiac Enzymes  Recent Labs  08/31/14 1819 08/31/14 2316 09/01/14 0455  TROPONINI <0.03 <0.03 <0.03    TELE  Afib at rate 50-60s. Occasionally in high 40s.    ASSESSMENT AND PLAN  1. Atrial fibrillation. - Rate controlled, Occasionally in high 40s. Continue BB and diltiazem. On apixaban.  - Goal for rate control given sleep apnea and severe left atrial enlargement.  2. Aortic regurgitation with LVEF 45-50%: Consider outpatient TEE to better characterize severity of aortic regurgitation.  3. OSA: On CPAP. This contributing to atrial fibrillation. Had witenessed apneic episodes.   4. Acute systolic heart failure, LVEF 45-50%:  - Appears euvolemic. Possibly tachycardia-mediated given diffuse hypokinesis.  - Will need home oxygen  - Consider outpatient nuclear stress test to rule out ischemic etiology.  5. Aspiration pneumonia - per primary - Leucocytosis trending down  6. Pulmonary  nodule on CT - per primary Signed, Bhagat,Bhavinkumar PA-C Pager [phone]  History and all data above reviewed.  Patient examined.  I agree with the findings as above.  The patient exam reveals [id number]  ,  Lungs: Clear  ,  Abd: Positive bowel sounds, no rebound no guarding, Ext No edema  .  All available labs, radiology testing, previous records reviewed. Agree with documented assessment and plan. Atrial fib:  Continue current the therapy.  I left a message for our staff to arrange follow up in our office.      Fayrene Fearing Maleeha Halls  12:06 PM  09/03/2014

## 2014-09-03 NOTE — Progress Notes (Signed)
Physical Therapy Treatment Patient Details Name: [name] MRN: [id number] DOB: 1955/03/22 Today's Date: 09/03/2014    History of Present Illness [name] is a 59 y.o. female h/o turner's syndrome, mild aortic insufficiency. Patient went to her PCPs office today with respiratory distress and palpitations onset several days ago. Had been taking delsym for the SOB but this didn't help at all. Patient found to be in A.Fib with rate between 150-220    PT Comments    Pt was able to do a significant amount of activity today, however, she is limited by generalized weakness/deconditioning, and DOE.  O2 sats on 4 L O2 [place] dropped as low as 77% and generally stayed around 82-84 throughout mobility ON oxygen.  She only came back up into the 90s with seated rest break and verbal cues for pursed lip breathing. PT will continue to follow acutely.   Follow Up Recommendations  Home health PT;Supervision/Assistance - 24 hour     Equipment Recommendations  Other (comment) (will likely need home O2)    Recommendations for Other Services  NA     Precautions / Restrictions Precautions Precautions: Fall;Other (comment) Precaution Comments: monitor O2 sats during gait.  Restrictions Weight Bearing Restrictions: No    Mobility  Bed Mobility Overal bed mobility: Needs Assistance Bed Mobility: Supine to Sit     Supine to sit: Min assist     General bed mobility comments: Min assist to support trunk to get to sitting EOB from flat bed.  Pt attempted several times on her own without success.   Transfers Overall transfer level: Needs assistance Equipment used: Rolling walker (2 wheeled) Transfers: Sit to/from Stand Sit to Stand: Min assist Stand pivot transfers: Min guard       General transfer comment: Min assist to support trunk to stand, especially from lower recliner and lower toilet.  Min guard from higher bed.   Ambulation/Gait Ambulation/Gait assistance: Min  assist Ambulation Distance (Feet): 60 Feet Assistive device: Rolling walker (2 wheeled) Gait Pattern/deviations: Step-through pattern;Shuffle;Trunk flexed Gait velocity: decreased Gait velocity interpretation: Below normal speed for age/gender General Gait Details: Pt with slow, shuffling gait speed, some difficulty maneuvering the RW around obstacles in the room.  Assist needed at trunk and to help steer RW.           Balance Overall balance assessment: Needs assistance Sitting-balance support: Feet supported;Bilateral upper extremity supported Sitting balance-Leahy Scale: Fair     Standing balance support: Bilateral upper extremity supported Standing balance-Leahy Scale: Poor                      Cognition Arousal/Alertness: Lethargic Behavior During Therapy: WFL for tasks assessed/performed Overall Cognitive Status: History of cognitive impairments - at baseline (mom reports she seems more lethargic today)                         General Comments General comments (skin integrity, edema, etc.): O2 sats as low as 77% on 4 L O2 [place] during gait.  Pt is a mouth breather and has to be reminded to close her mouth and purse lip breathe during mobility to help maintain sats.       Pertinent Vitals/Pain Pain Assessment: No/denies pain    Home Living Family/patient expects to be discharged to:: Private residence Living Arrangements: Parent Available Help at Discharge: Family;Available 24 hours/day Type of Home: Apartment Home Access: Stairs to enter Entrance Stairs-Rails: None Home Layout: One level  Home Equipment: Walker - 2 wheels;Tub bench;Bedside commode      Prior Function Level of Independence: Independent with assistive device(s)          PT Goals (current goals can now be found in the care plan section) Acute Rehab PT Goals Patient Stated Goal: to go home with her mom Progress towards PT goals: Progressing toward goals    Frequency  Min  3X/week    PT Plan Current plan remains appropriate       End of Session Equipment Utilized During Treatment: Oxygen Activity Tolerance: Treatment limited secondary to medical complications (Comment) (limited by DOE and weakness. ) Patient left: in chair;with call bell/phone within reach     Time: 1342-1421 PT Time Calculation (min) (ACUTE ONLY): 39 min  Charges:  $Gait Training: 23-37 mins $Therapeutic Activity: 8-22 mins                      [name], PT, DPT [phone]   09/03/2014, 2:32 PM

## 2014-09-03 NOTE — Discharge Summary (Signed)
Physician Discharge Summary  MASSA PE ZOX:[id number] DOB: Dec 07, 1955 DOA: 08/27/2014  PCP: [name], MD  Admit date: 08/27/2014 Discharge date: 09/04/2014  Recommendations for Outpatient Follow-up:  1. F/u with Dr.Wert in 1 week and Dr. Vassie Loll in 3 months or sooner as needed for sleep study and PFTs 2. Started on bipap at night 20/10, rate 18, 3L  3. Home O2 continuous 3L 4. Started on diltiazem and metoprolol was decreased for rate control of a-fib 5. Started on anticoagulation 6. F/u with cardiology in 2 weeks for outpatient stress test 7. Repeat CT chest February 2017 to reevaluate pulmonary nodule 8. PCP in mid-September, please repeat TFTs at adjust synthroid at that time  Discharge Diagnoses:  Principal Problem:   Atrial fibrillation with RVR Active Problems:   Benign hypertensive heart disease without heart failure   Turner syndrome   Aortic insufficiency   Hypertension   OSA (obstructive sleep apnea)   New onset a-fib   Acute and chronic respiratory failure with hypoxia   Acute systolic congestive heart failure   Leukocytosis   Aspiration pneumonia   Pulmonary nodule   LAD (lymphadenopathy), mediastinal   Dyspnea   Adult hypothyroidism   Turner's syndrome   Acute on chronic respiratory failure with hypercapnia   Discharge Condition: stable, improved  Diet recommendation: healthy heart  Wt Readings from Last 3 Encounters:  09/04/14 67.586 kg (149 lb)  06/06/14 64.774 kg (142 lb 12.8 oz)  03/20/14 66.134 kg (145 lb 12.8 oz)    History of present illness:  The patient is a 59 year old female with history of Turner syndrome, mild aortic valve insufficiency. She presented to her primary care doctor's office with respiratory distress and palpitations several days prior to admission. She was found to be in A. fib with RVR. She received 20 mg of Cardizem which caused her systolic blood pressure did drop to the 70s systolic. She was then transported by EMS to  the emergency department.  8/24: CT angio chest: Lingular pneumonia, started zosyn. Incidental pulm nodule. Transitioned off dilt gtt 8/25: Increasingly tachycardic but feeling better with diuresis 8/26: CO2 elevated and increased hypoxia. Bipap, diamox started 8/27: pulm consulted, bipap adjusted 8/28: D/c diamox. O2 weaned to 3L, bipap adjusted  Hospital Course:   Atrial fibrillation with rapid ventricular response, Heart rate of 130-140 initially. CHA2DS2-VASc Score and unadjusted Ischemic Stroke Rate (% per year) is equal to 2.2% stroke rate/year from a score of 2, female gender and hypertension.  She was seen by cardiology and was initially placed on a diltiazem gtt but quickly transitioned to oral diltiazem.  Her rate remained elevated so some metoprolol was resumed but at a lower dose than her home dose.  She was anticoagulated with apixaban.  ECHO demonstrated no regional wall motion abnormalities and cardiology felt that her a-fib may have been triggered by underlying pneumonia, however, they also recommended an outpatient stress test.  Cardiology recommended TEE with cardioversion, however, the patient and her mother declined.    Moderate aortic insufficiency.  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  CT demonstrated no aneurysmal dilation of the thoracic aorta.  Defer timing of cardiothoracic surgery consultation versus interventionalist consultation to cardiology as outpatient.    Acute hypoxic respiratory failure secondary to acute systolic heart failure and aspiration pneumonia.  CT angio was negative for PE.  ECHO demonstrated EF of 45-50%.  She was diuresed and treated  with antibiotics, but continued to require oxygen.  She was initially diuresed with lasix, but due to metabolic alkalosis, was transitioned to IV diamox at the recommendation of pulmonology for a few days.  Her  effusions and edema on CXR improved.  She required 3L O2 at discharge during the day.  Her home lasix was increased to twice daily.  She is on beta blocker.  She will follow up with cardiology in a few weeks for stress test.  Consider addition of ACEI if she is not hypotensive.    Severe OSA with hypoxia to the 70s and pauses longer than 20 seconds in her breathing overnight.  She was seen by pulmonology who changed her cpap to bipap and adjusted her settings.  Her recommended bipap settings at the time of discharge were 20/10, 3L.     Aspiration pneumonia v. CAP.  Initially treated with broad spectrum antibiotics, but narrowed to unasyn.  S. pneumo neg and legionella neg.  She should continue a 7-day course of antibiotics with Augmentin, last doses on 8/30, then stop.    Pulmonary nodule and mild mediastinal lymphadenopathy probably secondary to edema and pneumonia.  Will need repeat CT chest in 6 months, February of 2017.  Hypertension, BP low normal  - Adding metoprolol so monitor closely  Turner syndrome, with some typical features such as webbed neck, Zein Helbing stature, shield chest, but she does not appear to have a bicuspid aortic valve  Hypothyroidism, TSH 0.370 borderline low and free T4 mildly elevated at 1.43.  Synthroid decreased to .  Repeat TFTs in 4 weeks  Leukocytosis, afebrile. Possible pneumonia. UCx neg. Trended down with antibiotics.    Hypokalemia due to diuresis, resolved with oral potassium repletion.  Consultants:  Cardiology, Dr. Royann Shivers   Pulmonology, Drs Delford Field and Marchelle Gearing  Procedures:  Echocardiogram  CT angio chest  CXR   Antibiotics:  None  Discharge Exam: Filed Vitals:   09/04/14 0850  BP: 112/50  Pulse:   Temp:   Resp:    Filed Vitals:   09/04/14 0145 09/04/14 0558 09/04/14 0559 09/04/14 0850  BP:  115/47  112/50  Pulse: 74 77 65   Temp:  97.5 F (36.4 C)    TempSrc:  Oral    Resp: 18 18 18    Height:      Weight:  67.586  kg (149 lb)    SpO2: 96% 96% 94%      General: Adult female, mildly SOB after getting up with physical therapy.    HEENT: NCAT, MMM, webbed neck   Cardiovascular: IRRR, nl S1, S2 no mrg, 2+ pulses, warm extremities  Respiratory: diminished at the bilateral bases, no wheezes or rhonchi.   Abdomen: NABS, soft, NT/ND  MSK: Normal tone and bulk, 1+ RLE edema  Neuro: Grossly moves all extremities  Discharge Instructions      Discharge Instructions    (HEART FAILURE PATIENTS) Call MD:  Anytime you have any of the following symptoms: 1) 3 pound weight gain in 24 hours or 5 pounds in 1 week 2) shortness of breath, with or without a dry hacking cough 3) swelling in the hands, feet or stomach 4) if you have to sleep on extra pillows at night in order to breathe.    Complete by:  As directed      Call MD for:  difficulty breathing, headache or visual disturbances    Complete by:  As directed      Call MD for:  extreme fatigue  Complete by:  As directed      Call MD for:  hives    Complete by:  As directed      Call MD for:  persistant dizziness or light-headedness    Complete by:  As directed      Call MD for:  persistant nausea and vomiting    Complete by:  As directed      Call MD for:  severe uncontrolled pain    Complete by:  As directed      Call MD for:  temperature >100.4    Complete by:  As directed      Diet - low sodium heart healthy    Complete by:  As directed      Discharge instructions    Complete by:  As directed   Please continue Augmentin, your next dose is due this evening and your last doses are due on 8/30.  Please start taking diltiazem for your heart rate.  We have reduced your metoprolol to 12.5mg  twice a day instead of 25 mg once a day for now.  Follow up with cardiology in 2 weeks to reevaluate these medications and schedule a stress test.  Atrial fibrillation increases your risk of stroke.  You have been started on the blood thinner apixaban.   If you notice bleeding, particularly blood in your stools or have any bleeding that will not stop, please come immediately to the hospital or call 911.  You had a small nodule in your lung which may be due to pneumonia or fluid, but you will need a CT scan in February to make sure the nodule looks the same.   Your thyroid hormone was a little elevated so I have reduced your synthroid slightly for the next few weeks.  Please have your primary care doctor check you thyroid levels in 3-4 weeks at your next appointment and make further adjustments if needed.     Increase activity slowly    Complete by:  As directed             Medication List    TAKE these medications        apixaban 5 MG Tabs tablet  Commonly known as:  ELIQUIS  Take 1 tablet (5 mg total) by mouth 2 (two) times daily.     diltiazem 360 MG 24 hr capsule  Commonly known as:  CARDIZEM CD  Take 1 capsule (360 mg total) by mouth daily.     furosemide 40 MG tablet  Commonly known as:  LASIX  Take 1 tablet (40 mg total) by mouth 2 (two) times daily.     KLOR-CON M10 10 MEQ tablet  Generic drug:  potassium chloride  TAKE ONE TABLET BY MOUTH TWICE DAILY     levothyroxine 88 MCG tablet  Commonly known as:  SYNTHROID, LEVOTHROID  Take 1 tablet (88 mcg total) by mouth daily before breakfast.     metoprolol tartrate 25 MG tablet  Commonly known as:  LOPRESSOR  Take 0.5 tablets (12.5 mg total) by mouth 2 (two) times daily.     Vitamin D3 2000 UNITS capsule  Take 2,000 Units by mouth daily.       Follow-up Information    Follow up with ANDY,CAMILLE L, MD. Schedule an appointment as soon as possible for a visit in 1 month.   Specialty:  Family Medicine   Contact information:   [address] [place] [address] [phone]  Follow up with Oretha Milch., MD. Schedule an appointment as soon as possible for a visit in 2 weeks.   Specialty:  Pulmonary Disease   Contact information:   [address] [place] [address] [phone]       Follow up with Ronny Flurry, MD. Schedule an appointment as soon as possible for a visit in 2 weeks.   Specialty:  Cardiology   Contact information:   [address] [place] [address] [phone]       Follow up with [facility] On 09/05/2014.   Why:  Sleep Study @ 8:00 pm.    Contact information:   [address] [place] [address] [phone]      Follow up with [facility].   Why:  BIPAP   Contact information:   [address] [place] [address] [phone]        The results of significant diagnostics from this hospitalization (including imaging, microbiology, ancillary and laboratory) are listed below for reference.    Significant Diagnostic Studies: Ct Angio Chest Pe W/cm &/or Wo Cm  08/29/2014   CLINICAL DATA:  Elevated D-dimer.  Extreme shortness of breath.  EXAM: CT ANGIOGRAPHY CHEST WITH CONTRAST  TECHNIQUE: Multidetector CT imaging of the chest was performed using the standard protocol during bolus administration of intravenous contrast. Multiplanar CT image reconstructions and MIPs were obtained to evaluate the vascular anatomy.  CONTRAST:  OMNIPAQUE IOHEXOL 350 MG/ML SOLN  COMPARISON:  Chest x-ray 08/29/2014  FINDINGS: Aberrant venous anatomy. The left innominate vein is not visualized. 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 Good opacification of pulmonary arteries. No filling defects in the pulmonary arteries to suggest pulmonary emboli. The aorta is un opacified. Tortuosity of the aorta with calcifications in the aortic arch and descending thoracic aorta. No evidence of aortic aneurysm. Maximum diameter 3.6 cm in the proximal descending thoracic aorta. The aorta is not opacified and. Therefore, cannot evaluate for dissection.  There  are enlarged mediastinal lymph nodes. AP window lymph node has a Sadarius Norman axis diameter of 19 mm. Prevascular node has a Mikenzi Raysor axis diameter of 11 mm. Right peritracheal node has a Tevyn Codd axis diameter of 13 mm. No hilar or axillary adenopathy.  There is cardiomegaly. There are small bilateral pleural effusions. Vascular congestion noted. There is dependent opacities in the lower lobes, likely compressive atelectasis. Airspace opacity within the lingula is concerning for pneumonia. Also within the lingula peripherally is a 7 mm nodule on image 70. This may be inflammatory but warrants followup.  Chest wall soft tissues are unremarkable. Imaging into the upper abdomen shows no acute findings.  Review of the MIP images confirms the above findings.  IMPRESSION: No evidence of pulmonary embolus. No evidence of aortic aneurysm. The aorta is un opacified and therefore dissection cannot be completely excluded.  Small bilateral pleural effusions with compressive atelectasis in the lower lobes. Area of consolidation in the lingula is concerning for pneumonia.  7 mm lingular nodule which could be inflammatory but warrants followup. If the patient is at high risk for bronchogenic carcinoma, follow-up chest CT at 3-42months is recommended. If the patient is at low risk for bronchogenic carcinoma, follow-up chest CT at 6-12 months is recommended. This recommendation follows the consensus statement: Guidelines for Management of Small Pulmonary Nodules Detected on CT Scans: A Statement  from the Fleischner Society as published in Radiology 2005; 237:395-400.  Cardiomegaly, vascular congestion.  Mild mediastinal adenopathy. Recommend attention on followup imaging.   Electronically Signed   By: Charlett Nose M.D.   On: 08/29/2014 11:18   Dg Chest Port 1 View  08/31/2014   CLINICAL DATA:  Hypoxia  EXAM: PORTABLE CHEST - 1 VIEW  COMPARISON:  08/29/2014  FINDINGS: Moderate enlargement of the cardiopericardial silhouette, stable. No  mediastinal or hilar masses or evidence of adenopathy.  Mild opacity noted at the right lung base likely combination of atelectasis and a small pleural effusion. No evidence of pulmonary edema. No pneumonia. No pneumothorax.  IMPRESSION: 1. Resolved pulmonary edema. 2. No new lung abnormalities.   Electronically Signed   By: Amie Portland M.D.   On: 08/31/2014 17:46   Dg Chest Port 1 View  08/29/2014   CLINICAL DATA:  Dyspnea today.  EXAM: PORTABLE CHEST - 1 VIEW  COMPARISON:  Single view of the chest 08/27/2014. PA and lateral chest 08/15/2012.  FINDINGS: There is cardiomegaly and pulmonary edema. Small bilateral pleural effusions are seen. No pneumothorax. Basilar atelectasis noted.  IMPRESSION: Cardiomegaly and pulmonary edema with small bilateral pleural effusions.   Electronically Signed   By: Drusilla Kanner M.D.   On: 08/29/2014 10:14   Dg Chest Port 1 View  08/27/2014   CLINICAL DATA:  Shortness of breath  EXAM: PORTABLE CHEST - 1 VIEW  COMPARISON:  08/15/2012  FINDINGS: Cardiomegaly with pulmonary vascular congestion. No frank interstitial edema. Mild right basilar atelectasis. No pleural effusion or pneumothorax.  IMPRESSION: Cardiomegaly with pulmonary vascular congestion. No frank interstitial edema.   Electronically Signed   By: Charline Bills M.D.   On: 08/27/2014 17:48    Microbiology: Recent Results (from the past 240 hour(s))  MRSA PCR Screening     Status: None   Collection Time: 08/27/14 11:07 PM  Result Value Ref Range Status   MRSA by PCR NEGATIVE NEGATIVE Final    Comment:        The GeneXpert MRSA Assay (FDA approved for NASAL specimens only), is one component of a comprehensive MRSA colonization surveillance program. It is not intended to diagnose MRSA infection nor to guide or monitor treatment for MRSA infections.   Urine culture     Status: None   Collection Time: 08/30/14  8:43 AM  Result Value Ref Range Status   Specimen Description URINE, RANDOM  Final    Special Requests Normal  Final   Culture NO GROWTH 1 DAY  Final   Report Status 08/31/2014 FINAL  Final     Labs: Basic Metabolic Panel:  Recent Labs Lab 08/31/14 0415 08/31/14 1819 09/01/14 0455 09/02/14 0342 09/03/14 0243 09/04/14 0537  NA 137 138 138 136 138 135  K 2.8* 4.1 4.4 4.4 3.5 3.5  CL 91* 94* 98* 99* 99* 97*  CO2 38* 37* 33* 31 32 31  GLUCOSE 114* 138* 127* 120* 156* 110*  BUN 11 16 17  21* 17 17  CREATININE 0.71 0.78 0.75 0.73 0.73 0.62  CALCIUM 8.4* 8.8* 8.7* 8.8* 8.5* 8.5*  MG 2.2  --  2.2 2.4 2.2 2.0   Liver Function Tests:  Recent Labs Lab 08/29/14 0800  AST 40  ALT 78*  ALKPHOS 115  BILITOT 0.9  PROT 5.9*  ALBUMIN 3.4*   No results for input(s): LIPASE, AMYLASE in the last 168 hours. No results for input(s): AMMONIA in the last 168 hours. CBC:  Recent Labs Lab 08/29/14 0800  08/30/14 0240 09/03/14 1700  WBC 15.2* 13.2* 12.6*  HGB 14.5 14.0 14.0  HCT 43.6 41.7 43.5  MCV 99.3 98.1 100.2*  PLT 299 249 242   Cardiac Enzymes:  Recent Labs Lab 08/28/14 1505 08/28/14 2203 08/31/14 1819 08/31/14 2316 09/01/14 0455  TROPONINI 0.04* 0.04* <0.03 <0.03 <0.03   BNP: BNP (last 3 results) No results for input(s): BNP in the last 8760 hours.  ProBNP (last 3 results) No results for input(s): PROBNP in the last 8760 hours.  CBG: No results for input(s): GLUCAP in the last 168 hours.  Time coordinating discharge: 35 minutes  Signed:  Ammar Moffatt  [facility] 09/04/2014, 11:48 AM

## 2014-09-03 NOTE — Progress Notes (Signed)
Call from [place] R[facility] BiPAP was icbnreaed to 20/10 and bleed in of 100%/15L O2 due to desats but she says pulse ox dips not properly documented with lowesst amount we could see is 91% but likely aptient desaturated more.   That is a lot of o2  rec pccm back on list Hold dc home Monitor with povernight pulse ox - goal > 86%, ABG in AM  Dr. Kalman Shan, M.D., [facility] Pulmonary and Critical Care Medicine Staff Physician [facility] Pager: [phone], If no answer or between  15:00h - 7:00h: call [phone]  09/03/2014 11:03 AM

## 2014-09-03 NOTE — Evaluation (Signed)
Clinical/Bedside Swallow Evaluation Patient Details  Name: [name] MRN: [id number] Date of Birth: 1955/07/19  Today's Date: 09/03/2014 Time: SLP Start Time (ACUTE ONLY): 1115 SLP Stop Time (ACUTE ONLY): 1134 SLP Time Calculation (min) (ACUTE ONLY): 19 min  Past Medical History:  Past Medical History  Diagnosis Date  . Turner's syndrome   . Aortic insufficiency     a. Mod AI by echo 2013.  Marland Kitchen Hypothyroidism   . Periorbital edema   . Hypertension   . Cervix abnormality 1993    MILD ATYPICAL ADENOMATOUS HYPERPLASIA OF CERVIX  . Dermatitis 05-26-12    all over  since 9'13  . Edema of both legs     a. right greater than left, due to Turner's syndrome.  Marland Kitchen Speech disorder     occ. "stutter"  . Difficult intubation     will plan on spinal anesthesia"small mouth/airway"  . Abdominal aortic stenosis     a. Mild AS bye cho 2013.  Marland Kitchen Acute systolic congestive heart failure 08/29/2014   Past Surgical History:  Past Surgical History  Procedure Laterality Date  . Total hip arthroplasty      LEFT  . US echocardiography  06/20/2009    EF 55-60%  . Dilation and curettage of uterus  1993    CONE BIOPSY  . Cervical cone biopsy  1993  . Hernia repair    . Colposcopy    . Joint replacement      hip replacement  . Total knee arthroplasty Right 05/31/2012    Procedure: RIGHT TOTAL KNEE ARTHROPLASTY;  Surgeon: Shelda Pal, MD;  Location: [facility];  Service: Orthopedics;  Laterality: Right;   HPI:  [name] is a 59 y.o. female h/o turner's syndrome, mild aortic insufficiency. Patient went to her PCPs office today with respiratory distress and palpitations onset several days ago. Had been taking delsym for the SOB but this didn't help at all. Patient found to be in A.Fib with rate between 150-220. Pt has been having desaturations with concern on imaging for PNA, therefore swallow evaluation was ordered.   Assessment / Plan / Recommendation Clinical Impression  Pt's wallow appears  to be [facility] with swift initiation and consistent exhalation post-swallow. However, current respiratory status and SOB does increase her risk of aspiration. Would continue with regular diet and thin liquids, but SLP provided education to pt and mother about energy conservation, avoiding dry foods, and taking breaks PRN to account for SOB. Will f/u briefly for tolerance and reinforcement of recommended strategies.    Aspiration Risk  Mild    Diet Recommendation Age appropriate regular solids;Thin   Medication Administration: Whole meds with liquid Compensations: Slow rate;Small sips/bites    Other  Recommendations Oral Care Recommendations: Oral care BID   Follow Up Recommendations       Frequency and Duration min 1 x/week  1 week   Pertinent Vitals/Pain n/a    SLP Swallow Goals     Swallow Study Prior Functional Status  Type of Home: Apartment Available Help at Discharge: Family;Available 24 hours/day    General Other Pertinent Information: [name] is a 59 y.o. female h/o turner's syndrome, mild aortic insufficiency. Patient went to her PCPs office today with respiratory distress and palpitations onset several days ago. Had been taking delsym for the SOB but this didn't help at all. Patient found to be in A.Fib with rate between 150-220. Pt has been having desaturations with concern on imaging for PNA, therefore swallow evaluation  was ordered. Type of Study: Bedside swallow evaluation Previous Swallow Assessment: none in chart Diet Prior to this Study: Regular;Thin liquids Temperature Spikes Noted: No Respiratory Status: Supplemental O2 delivered via (comment) ([place]) History of Recent Intubation: No Behavior/Cognition: Alert;Cooperative;Pleasant mood;Requires cueing Oral Cavity - Dentition: Adequate natural dentition/normal for age Self-Feeding Abilities: Able to feed self Patient Positioning: Upright in bed Baseline Vocal Quality: Normal    Oral/Motor/Sensory Function  Overall Oral Motor/Sensory Function: Appears within functional limits for tasks assessed   Ice Chips Ice chips: Not tested   Thin Liquid Thin Liquid: Within functional limits Presentation: Self Fed;Straw    Nectar Thick Nectar Thick Liquid: Not tested   Honey Thick Honey Thick Liquid: Not tested   Puree Puree: Within functional limits Presentation: Self Fed;Spoon   Solid    Solid: Within functional limits Presentation: Self Fed      Maxcine Ham, M.A. CCC-SLP [phone]  Maxcine Ham 09/03/2014,11:58 AM

## 2014-09-03 NOTE — Care Management Note (Addendum)
Case Management Note  Patient Details  Name: [name] MRN: [id number] Date of Birth: 1955/03/05  Subjective/Objective:   Pt admitted for A Fib. Pt is from home with mother.                  Action/Plan: CM did speak with pt in regards to [facility] services. Pt does not qualify for [facility] PT Services due to insurance and she does not have a qualifying diagnosis. Staff RN aware and she is to make MD aware.   Expected Discharge Date:    CM did set pt up with an outpatient Sleep Study for Wednesday 09-05-14 at 8 pm.  DME Bipap and 02 to be provided by [facility]. Referral was made. No further needs from CM at this time.            Expected Discharge Plan:  Home/Self Care  In-House Referral:     Discharge planning Services  CM Consult  Post Acute Care Choice:  NA Choice offered to:  NA  DME Arranged:  Bipap, Oxygen DME Agency:  [facility]  HH Arranged:  NA HH Agency:  NA  Status of Service:  Completed, signed off  Medicare Important Message Given:    Date Medicare IM Given:    Medicare IM give by:    Date Additional Medicare IM Given:    Additional Medicare Important Message give by:     If discussed at Long Length of Stay Meetings, dates discussed:    Additional Comments:1203 08-30-14 Tomi Bamberger, RN,BSN [phone] CM did receive referral for Co pay for apixaban (ELIQUIS) tablet 5 mg. Benefits check in process and will make pt aware once completed. Co pay should be $3.00. CM will provide pt with 30 day free card. Pt will need Rx for 30 day free no refills. Pt uses [facility]: medication is available. Pt is from home with Mother. Pt has DME RW, BSC and 02 2L via [facility]. Pt's mother takes her to appointments. CM will ask CSW to assist with Medicaid Transportation information. No further needs from CM at this time.    Gala Lewandowsky, RN 09/03/2014, 11:26 AM

## 2014-09-03 NOTE — Evaluation (Signed)
Occupational Therapy Evaluation Patient Details Name: [name] MRN: [id number] DOB: [birth year] Today's Date: 09/03/2014    History of Present Illness [name] is a 59 y.o. female h/o turner's syndrome, mild aortic insufficiency. Patient went to her PCPs office today with respiratory distress and palpitations onset several days ago. Had been taking delsym for the SOB but this didn't help at all. Patient found to be in A.Fib with rate between 150-220   Clinical Impression   Pt admitted with above. Pt independent with ADLs, PTA. Feel pt will benefit from acute OT to increase independence prior to d/c. Recommending HHOT.    Follow Up Recommendations  Home health OT;Supervision/Assistance - 24 hour    Equipment Recommendations  Other (comment) (AE)    Recommendations for Other Services       Precautions / Restrictions Precautions Precautions: Fall Precaution Comments: watch O2 sats Restrictions Weight Bearing Restrictions: No      Mobility Bed Mobility Overal bed mobility: Needs Assistance Bed Mobility: Supine to Sit     Supine to sit: Supervision        Transfers Overall transfer level: Needs assistance Equipment used: Rolling walker (2 wheeled) Transfers: Sit to/from [employer] Sit to Stand: Supervision Stand pivot transfers: Min guard            Balance  Used RW for ambulation/stand pivot transfer.                                          ADL Overall ADL's : Needs assistance/impaired     Grooming: Wash/dry face;Set up;Supervision/safety;Standing               Lower Body Dressing: Maximal assistance;Sit to/from stand   Toilet Transfer: Min guard;Ambulation;RW;Stand-pivot;BSC;Supervision/safety (supervision for sit to stand transfer)   Toileting- Architect and Hygiene: Min guard;Sit to/from stand       Functional mobility during ADLs: Min guard;Rolling walker General ADL Comments:  Educated on energy conservation and deep breathing technique. Explained AE is available to help with LB ADLs.     Vision  Pt wears glasses.   Perception     Praxis      Pertinent Vitals/Pain Pain Assessment: No/denies pain; Pt on 3L of O2 at beginning of session and O2 at 97%. Ambulated to sink on RA and pt washed her face and returned to sitting EOB, and O2 dropped to around 87% (monitory had difficult time reading O2-unsure of accuracy). Placed pt back on 3L of O2.     Hand Dominance     Extremity/Trunk Assessment Upper Extremity Assessment Upper Extremity Assessment: Overall WFL for tasks assessed   Lower Extremity Assessment Lower Extremity Assessment: Defer to PT evaluation   Cervical / Trunk Assessment Cervical / Trunk Assessment: Other exceptions Cervical / Trunk Exceptions: webbed neck and other Turner's presentations   Communication Communication Communication: No difficulties   Cognition Arousal/Alertness: Awake/alert Behavior During Therapy: WFL for tasks assessed/performed Overall Cognitive Status: History of cognitive impairments - at baseline                     General Comments       Exercises       Shoulder Instructions      Home Living Family/patient expects to be discharged to:: Private residence Living Arrangements: Parent Available Help at Discharge: Family;Available 24 hours/day Type of Home: Apartment Home Access:  Stairs to enter [employer] of Steps: 1 Entrance Stairs-Rails: None Home Layout: One level     Bathroom Shower/Tub: Tub/shower unit         Home Equipment: Environmental consultant - 2 wheels;Tub bench;Bedside commode          Prior Functioning/Environment Level of Independence: Independent with assistive device(s)             OT Diagnosis: Generalized weakness   OT Problem List: Decreased strength;Cardiopulmonary status limiting activity;Decreased knowledge of precautions;Decreased knowledge of use of DME or  AE;Decreased range of motion;Decreased activity tolerance   OT Treatment/Interventions: Self-care/ADL training;DME and/or AE instruction;Energy conservation;Therapeutic exercise;Therapeutic activities;Patient/family education;Balance training;Cognitive remediation/compensation    OT Goals(Current goals can be found in the care plan section) Acute Rehab OT Goals Patient Stated Goal: not stated OT Goal Formulation: With patient/family Time For Goal Achievement: 09/10/14 Potential to Achieve Goals: Good ADL Goals Pt Will Perform Upper Body Bathing: with set-up;sitting Pt Will Perform Lower Body Bathing: with set-up;sit to/from stand;with adaptive equipment Pt Will Perform Lower Body Dressing: with set-up;with adaptive equipment;sit to/from stand Pt Will Transfer to Toilet: with supervision;ambulating Additional ADL Goal #1: Pt will independently verbalize 3/3 energy conservation techniques and  incorporate in session.  OT Frequency: Min 2X/week   Barriers to D/C:            Co-evaluation              End of Session Equipment Utilized During Treatment: Gait belt;Rolling walker Nurse Communication: Other (comment) (pt trying to have BM-nursing student present; O2 sat)  Activity Tolerance: Patient tolerated treatment well Patient left: Other (comment) (on [facility] with nursing student)   Time: 1610-9604 OT Time Calculation (min): 18 min Charges:  OT General Charges $OT Visit: 1 Procedure OT Evaluation $Initial OT Evaluation Tier I: 1 Procedure G-Codes:    Gloriann Riede L Sep 18, 2014, 11:06 AM

## 2014-09-04 LAB — BASIC METABOLIC PANEL
Anion gap: 7 (ref 5–15)
BUN: 17 mg/dL (ref 6–20)
CALCIUM: 8.5 mg/dL — AB (ref 8.9–10.3)
CHLORIDE: 97 mmol/L — AB (ref 101–111)
CO2: 31 mmol/L (ref 22–32)
CREATININE: 0.62 mg/dL (ref 0.44–1.00)
Glucose, Bld: 110 mg/dL — ABNORMAL HIGH (ref 65–99)
Potassium: 3.5 mmol/L (ref 3.5–5.1)
SODIUM: 135 mmol/L (ref 135–145)

## 2014-09-04 LAB — BLOOD GAS, ARTERIAL
Acid-Base Excess: 7.2 mmol/L — ABNORMAL HIGH (ref 0.0–2.0)
BICARBONATE: 31.2 meq/L — AB (ref 20.0–24.0)
DELIVERY SYSTEMS: POSITIVE
Drawn by: 40530
EXPIRATORY PAP: 10
INSPIRATORY PAP: 20
O2 CONTENT: 3 L/min
O2 Saturation: 94.2 %
PATIENT TEMPERATURE: 97.9
PH ART: 7.463 — AB (ref 7.350–7.450)
TCO2: 32.6 mmol/L (ref 0–100)
pCO2 arterial: 44 mmHg (ref 35.0–45.0)
pO2, Arterial: 63.4 mmHg — ABNORMAL LOW (ref 80.0–100.0)

## 2014-09-04 LAB — MAGNESIUM: MAGNESIUM: 2 mg/dL (ref 1.7–2.4)

## 2014-09-04 MED ORDER — FUROSEMIDE 40 MG PO TABS: 40.0000 mg | ORAL_TABLET | Freq: Two times a day (BID) | ORAL | Status: DC

## 2014-09-04 MED ORDER — POTASSIUM CHLORIDE CRYS ER 20 MEQ PO TBCR
20.0000 meq | EXTENDED_RELEASE_TABLET | Freq: Once | ORAL | Status: AC
  Administered 2014-09-04: 20 meq via ORAL
  Filled 2014-09-04: qty 1

## 2014-09-04 NOTE — Progress Notes (Signed)
Name: [name] MRN: [id number] DOB: [birth year]    ADMISSION DATE:  08/27/2014 CONSULTATION DATE:  09/04/2014  REFERRING MD :  Malachi Bonds TRH  CHIEF COMPLAINT:   Progressive dyspnea   BRIEF PATIENT DESCRIPTION:  59 year old female primary patient of Dr. Shelle Iron with severe sleep apnea on C Pap at home Admitted 08/27/2014 with acute pulmonary edema and A. fib with RVR. Pulmonary asked to comment on hypercarbia and C Pap therapy.  There is also been a question of aspiration and inability protect the airway  SIGNIFICANT EVENTS    STUDIES:  CT scan of chest did not show pulmonary emboli but showed bilateral pulmonary edema and pleural effusions, and left lung pneumonia  09/02/14:More alert. Less dyspneic  09/03/14 - Mom reports no issues. Denies questions. RN says overnight patient did get BiPAP. She is feeling well. Primary MD wondering about discharge home   SUBJECTIVE/OVERNIGHT/INTERVAL HX 8/3/0/16 - discharge held yesterday due to concerns that night before needed very high flow o2. RT asked to check continuous pulse ox last night but only able to do spot check on 3L [place] due to lack of machine avaialbilty to do continuous check. Spot check on 3L fine. Needed 20/10 bipap setting. ABG this AM fine. Mom still puzzled by dyspnea onset. Medicaid patient so cannot get home health services for PT. Mom does not want inpatient rehab  VITAL SIGNS: Temp:  [97.5 F (36.4 C)-97.9 F (36.6 C)] 97.5 F (36.4 C) (08/30 0558) Pulse Rate:  [54-90] 65 (08/30 0559) Resp:  [17-18] 18 (08/30 0559) BP: (112-126)/(47-64) 112/50 mmHg (08/30 0850) SpO2:  [82 %-99 %] 94 % (08/30 0559) Weight:  [149 lb (67.586 kg)] 149 lb (67.586 kg) (08/30 0558)  PHYSICAL EXAMINATION: General: awake alert sitting in bed Neuro:  No focal deficits HEENT:   turners syndrome facies. Mallampatti class 4, Short big neck with lot of adipose Cardiovascular:  Irregular regular normal S1-S2 Lungs:  Distant breath  sounds Abdomen:  Soft nontender bowel sounds active no organomegaly Musculoskeletal:  Full range of motion no joint deformity Skin:  Clear    PULMONARY  Recent Labs Lab 08/31/14 1645 09/01/14 0831 09/04/14 0455  PHART 7.494* 7.391 7.463*  PCO2ART 50.2* 55.4* 44.0  PO2ART 53.6* 89.0 63.4*  HCO3 38.2* 33.6* 31.2*  TCO2 39.8 35 32.6  O2SAT 89.5 96.0 94.2    CBC  Recent Labs Lab 08/29/14 0800 08/30/14 0240 09/03/14 1700  HGB 14.5 14.0 14.0  HCT 43.6 41.7 43.5  WBC 15.2* 13.2* 12.6*  PLT 299 249 242    COAGULATION No results for input(s): INR in the last 168 hours.  CARDIAC   Recent Labs Lab 08/28/14 1505 08/28/14 2203 08/31/14 1819 08/31/14 2316 09/01/14 0455  TROPONINI 0.04* 0.04* <0.03 <0.03 <0.03   No results for input(s): PROBNP in the last 168 hours.   CHEMISTRY  Recent Labs Lab 08/31/14 0415 08/31/14 1819 09/01/14 0455 09/02/14 0342 09/03/14 0243 09/04/14 0537  NA 137 138 138 136 138 135  K 2.8* 4.1 4.4 4.4 3.5 3.5  CL 91* 94* 98* 99* 99* 97*  CO2 38* 37* 33* 31 32 31  GLUCOSE 114* 138* 127* 120* 156* 110*  BUN 21* 17 17  CREATININE 0.71 0.78 0.75 0.73 0.73 0.62  CALCIUM 8.4* 8.8* 8.7* 8.8* 8.5* 8.5*  MG 2.2  --  2.2 2.4 2.2 2.0   Estimated Creatinine Clearance: 58.3 mL/min (by C-G formula based on Cr of 0.62).   LIVER  Recent Labs Lab 08/29/14  0800  AST 40  ALT 78*  ALKPHOS 115  BILITOT 0.9  PROT 5.9*  ALBUMIN 3.4*     INFECTIOUS No results for input(s): LATICACIDVEN, PROCALCITON in the last 168 hours.   ENDOCRINE CBG (last 3)  No results for input(s): GLUCAP in the last 72 hours.       IMAGING x48h  - image(s) personally visualized  -   highlighted in bold No results found.  8/26 - resolved pulm edema - xray not visualized 09/04/2014    ASSESSMENT / PLAN:  #1 obstructive sleep apnea with significant poor respiratory drive and acute on chronic hypercarbic and hypoxic respiratory failure.    -   Subjectively and Objectively  Improved with bipap settings 20/10 and 3L o2   Recommendations   Cont  BiPAP (current seettings 20/10 - go home on this) with 3L oxygen supplementation  QHS + PRN - cae mgmt consulted   3L O2 supplementation at home   Needs opd sleep fu and primary pulm fu - Dr Shelle Iron has left prctice - so assigned to Dr Vassie Loll - see below     Future Appointments Date Time Provider Department Center  09/13/2014 1:45 PM Nyoka Cowden, MD [facility] None  10/19/2014 3:45 PM Cassell Clement, MD [facility]  10/24/2014 9:00 AM Oretha Milch, MD [facility] None      #2 aspiration pneumonia   Per primary team Likely real dix    PCCM will sign off/  Care plan d/w Dr Malachi Bonds primary team at bedside    Dr. Kalman Shan, M.D., [facility].C.P Pulmonary and Critical Care Medicine Staff Physician [facility] Pager: [phone], If no answer or between  15:00h - 7:00h: call [phone]  09/04/2014 9:39 AM

## 2014-09-04 NOTE — Progress Notes (Addendum)
Patient Name: [name] Date of Encounter: 09/04/2014  Principal Problem:   Atrial fibrillation with RVR Active Problems:   Benign hypertensive heart disease without heart failure   Turner syndrome   Aortic insufficiency   Hypertension   OSA (obstructive sleep apnea)   New onset a-fib   Acute and chronic respiratory failure with hypoxia   Acute systolic congestive heart failure   Leukocytosis   Aspiration pneumonia   Pulmonary nodule   LAD (lymphadenopathy), mediastinal   Dyspnea   Adult hypothyroidism   Turner's syndrome   Acute on chronic respiratory failure with hypercapnia     Primary Cardiologist: Dr. Patty Sermons Patient Profile: 59yo female w/ PMH of Turner's Syndrome, HTN, hypothyroidism, OSA (on CPAP)  and mild aortic insufficiency admitted on 09/04/14 for increased shortness of breath and found to have new onset atrial fibrillation w/RVR.  SUBJECTIVE: Denies any chest pain or palpitations. Patient's mother reports she is having some shortness of breath when walking around the room.  OBJECTIVE Filed Vitals:   09/04/14 0145 09/04/14 0558 09/04/14 0559 09/04/14 0850  BP:  115/47  112/50  Pulse: 74 77 65   Temp:  97.5 F (36.4 C)    TempSrc:  Oral    Resp: Height:      Weight:  149 lb (67.586 kg)    SpO2: 96% 96% 94%     Intake/Output Summary (Last 24 hours) at 09/04/14 0915 Last data filed at 09/04/14 0850  Gross per 24 hour  Intake   1420 ml  Output      0 ml  Net   1420 ml   Filed Weights   09/02/14 0549 09/03/14 0500 09/04/14 0558  Weight: 146 lb 6.4 oz (66.407 kg) 145 lb 3.2 oz (65.862 kg) 149 lb (67.586 kg)    PHYSICAL EXAM General: Caucasian, female in no acute distress. Head: Normocephalic, atraumatic.  Neck: Supple without bruits, JVD not elevated. Lungs:  Resp regular and unlabored, decreased breath sounds at bases bilaterally. No wheezing or rales appreciated. Heart: Irregularly irregular, no S3, S4, or murmur; no  rub. Abdomen: Soft, non-tender, non-distended with normoactive bowel sounds. No hepatomegaly. No rebound/guarding. No obvious abdominal masses. Extremities: No clubbing or cyanosis.  Trace edema bilaterally. Distal pedal pulses are 2+ bilaterally. Neuro: Alert and oriented X 3. Moves all extremities spontaneously. Psych: Normal affect.   LABS: CBC: Recent Labs  09/03/14 1700  WBC 12.6*  HGB 14.0  HCT 43.5  MCV 100.2*  PLT 242   Basic Metabolic Panel: Recent Labs  09/03/14 0243 09/04/14 0537  NA 138 135  K 3.5 3.5  CL 99* 97*  CO2 32 31  GLUCOSE 156* 110*  BUN 17 17  CREATININE 0.73 0.62  CALCIUM 8.5* 8.5*  MG 2.2 2.0   TELE: Atrial Fibrillation with rate in 70's -80's.         ECHO: 08/28/14 Study Conclusions - Left ventricle: The cavity size was normal. Wall thickness was increased in a pattern of mild LVH. Systolic function was mildly reduced. The estimated ejection fraction was in the range of 45% to 50%. Diffuse hypokinesis. - Aortic valve: There was moderate regurgitation. - Ascending aorta: The ascending aorta was mildly dilated. - Mitral valve: Calcified annulus. There was mild regurgitation. - Left atrium: The atrium was severely dilated. - Right ventricle: Systolic function was mildly reduced. - Right atrium: The atrium was mildly dilated. - Pulmonary arteries: Systolic pressure was mildly increased. PA peak pressure:  43 mm Hg (S).  Impressions: - Technically difficult due to atrial fibrillation and tachycardia; ;mild global reduction in LV function; severe LAE; mild RAE; trileaflet aortic valve with thickening; AI difficult to quantitate but at least moderate; mild MR and TR; mildly elevated pulmonary pressure; suggest FU study once patient&'s HR improved of sinus reestablished.   Current Medications:  . amoxicillin-clavulanate  1 tablet Oral BID  . apixaban  5 mg Oral BID  . diltiazem  360 mg Oral Daily  . furosemide  40 mg  Oral Daily  . levothyroxine  88 mcg Oral QAC breakfast  . metoprolol tartrate  12.5 mg Oral BID      ASSESSMENT AND PLAN: 1. New onset Atrial Fibrillation w/ RVR - Rate has been in 70's -80's over the past 24 hours. Currently on Metoprolol 12.5mg  BID and Diltiazem 360mg  daily.  - This patients CHA2DS2-VASc Score and unadjusted Ischemic Stroke Rate (% per year) is equal to 2.2 % stroke rate/year from a score of 2 (Female gender and HTN). She is currently on Eliquis. - DCCV declined by patient and mother.  2. Acute Systolic Heart Failure  - Echo on 08/28/14 showed EF 45-50%. - Currently on Lasix 40mg  daily. Continue to monitor BMET and replete K+ as needed. K+ 3.5 on 09/04/14, therefore given this AM. - consider outpatient nuclear stress test to rule out ischemic etiology  3. Aortic Regurgitation with LVEF 45-50% - Declined TEE at this time. Consider outpatient TEE to better characterize aortic regurgitation severity  4. HTN - BP has been 112/47 - 126/43 in the past 24 hours.  5. Pulmonary Nodule  - on CT performed 08/29/14. Will need follow-up CT in 6 months. - per IM  6. Turner Syndrome - per IM  7. OSA - on CPAP  8. Aspiration Pneumonia - On Augmentin  - per IM  Signed, Wandra Mannan , PA-C 9:15 AM 09/04/2014 Pager: [phone] Agree with above assessment. She is anticoagulated with eliquis.  Her rate is controlled on lopressor and diltiazem. l.Exam today reveals only scattered rales and a soft murmur of aortic insufficiency grade 2/6. No significant edema.  As an outpatient we will consider gradual transition down on diltiazem dose and change to toprol in view of her EF of 45-50%.

## 2014-09-04 NOTE — Progress Notes (Addendum)
Sp02 95% @ 2038 on  3L02 Sp02 97% @ 2238 on BIPAP 3L02 Sp02 96% @ 0145 on BIPAP 3L02 Sp02 94% @ 0455 on BIPAP 3L 02 Sp02 94% @ 0539 on BIPAP 3L02

## 2014-09-04 NOTE — Progress Notes (Signed)
Pt discharged home with mother. All discharge paperwork gone over with pt and mother. They both verbalized understanding. Pt and mother instructed to go to pharmacy to pick up all prescription medications. IVs have been removed.

## 2014-09-04 NOTE — Progress Notes (Signed)
SLP Cancellation Note  Patient Details Name: [name] MRN: [id number] DOB: 1955-10-04   Cancelled treatment:       Reason Eval/Treat Not Completed: Patient at procedure or test/unavailable. SLP will follow up as able.  Fae Pippin, M.A., CCC-SLP [phone]  [name] 09/04/2014, 2:48 PM

## 2014-09-04 NOTE — Progress Notes (Signed)
Occupational Therapy Treatment Patient Details Name: [name] MRN: [id number] DOB: 1955-10-11 Today's Date: 09/04/2014    History of present illness [name] is a 59 y.o. female h/o turner's syndrome, mild aortic insufficiency. Patient went to her PCPs office today with respiratory distress and palpitations onset several days ago. Had been taking delsym for the SOB but this didn't help at all. Patient found to be in A.Fib with rate between 150-220   OT comments  Patient making progress towards goals. Pt continues to sound SOB, but 02 sats remained greater than 90% entire session (at rest pt on 3 L/min, during activity pt on RA). Pt takes extra time, but able to perform at an overall supervision>min guard level for functional mobility and transfers. Continue to recommend 24/7 supervision and HHOT to maximize ADL independence and safety throughout her house. Pt's mother reports that she can provide the needed about of supervision/assistance post acute d/c.    Follow Up Recommendations  Home health OT;Supervision/Assistance - 24 hour    Equipment Recommendations  Other (comment) (AE)    Recommendations for Other Services  None at this time   Precautions / Restrictions Precautions Precautions: Fall;Other (comment) Precaution Comments: monitor O2 sats during gait.  Restrictions Weight Bearing Restrictions: No    Mobility Bed Mobility Overal bed mobility: Needs Assistance Bed Mobility: Supine to Sit     Supine to sit: Supervision Sit to supine: Supervision   General bed mobility comments: Supervision for safety, extra time needed for independence. Minimal use of bed rails with HOB flat.   Transfers Overall transfer level: Needs assistance Equipment used: Rolling walker (2 wheeled) Transfers: Sit to/from Stand Sit to Stand: Min guard General transfer comment: Min guard for safety. Pt required cues to use RW, pt attempted to stand up and ambulate without RW. Cues for  hand placement and technique for safety.     Balance Overall balance assessment: Needs assistance Sitting-balance support: No upper extremity supported;Feet supported Sitting balance-Leahy Scale: Fair     Standing balance support: Bilateral upper extremity supported;During functional activity Standing balance-Leahy Scale: Fair Standing balance comment: RW for safety and balance   ADL Overall ADL's : Needs assistance/impaired General ADL Comments: Pt found supine in bed. Pt engaged in bed mobility, sat EOB, then after ~5 minute break ambulated > sink for grooming tasks of washing face and brushing teeth. Pt sounded SOB, but 02 sats greater than 90% entire session. Pt on 3 liters at rest and on RA during activity. Educated patient and mother on importance of pursed lip breathing as an energy conservation technique and importance of sitting during showers. Pt and mother can be educated more on energy conservation and will plan for further education during next OT session. Pt takes extra time to complete tasks and was independent PTA, recommending HHOT post acute d/c.      Cognition   Behavior During Therapy: WFL for tasks assessed/performed Overall Cognitive Status: History of cognitive impairments - at baseline                 Pertinent Vitals/ Pain       Pain Assessment: No/denies pain   Frequency Min 2X/week     Progress Toward Goals  OT Goals(current goals can now befound in the care plan section)  Progress towards OT goals: Progressing toward goals     Plan Discharge plan remains appropriate    End of Session Equipment Utilized During Treatment: Rolling walker;Oxygen (3 L/min)   Activity Tolerance Patient  tolerated treatment well   Patient Left in bed;with call bell/phone within reach;with family/visitor present    Time: 5956-3875 OT Time Calculation (min): 30 min  Charges: OT General Charges $OT Visit: 1 Procedure OT Treatments $Self Care/Home Management :  23-37 mins  Jameon Deller , MS, OTR/L, CLT Pager: [phone]  09/04/2014, 9:03 AM

## 2014-09-05 ENCOUNTER — Ambulatory Visit (HOSPITAL_BASED_OUTPATIENT_CLINIC_OR_DEPARTMENT_OTHER): Payer: Medicaid Other | Attending: Internal Medicine | Admitting: Radiology

## 2014-09-05 VITALS — Ht <= 58 in | Wt 141.0 lb

## 2014-09-05 DIAGNOSIS — I4892 Unspecified atrial flutter: Secondary | ICD-10-CM | POA: Diagnosis not present

## 2014-09-05 DIAGNOSIS — I4891 Unspecified atrial fibrillation: Secondary | ICD-10-CM | POA: Diagnosis not present

## 2014-09-05 DIAGNOSIS — R0683 Snoring: Secondary | ICD-10-CM | POA: Diagnosis not present

## 2014-09-05 DIAGNOSIS — G4733 Obstructive sleep apnea (adult) (pediatric): Secondary | ICD-10-CM | POA: Insufficient documentation

## 2014-09-13 ENCOUNTER — Encounter (HOSPITAL_BASED_OUTPATIENT_CLINIC_OR_DEPARTMENT_OTHER): Payer: Medicaid Other | Admitting: Pulmonary Disease

## 2014-09-13 ENCOUNTER — Encounter: Payer: Self-pay | Admitting: Internal Medicine

## 2014-09-13 ENCOUNTER — Ambulatory Visit (INDEPENDENT_AMBULATORY_CARE_PROVIDER_SITE_OTHER): Payer: Medicaid Other | Admitting: Internal Medicine

## 2014-09-13 VITALS — BP 110/60 | HR 75 | Ht <= 58 in | Wt 156.0 lb

## 2014-09-13 DIAGNOSIS — G4733 Obstructive sleep apnea (adult) (pediatric): Secondary | ICD-10-CM

## 2014-09-13 DIAGNOSIS — E669 Obesity, unspecified: Secondary | ICD-10-CM

## 2014-09-13 DIAGNOSIS — I351 Nonrheumatic aortic (valve) insufficiency: Secondary | ICD-10-CM | POA: Diagnosis not present

## 2014-09-13 DIAGNOSIS — J9612 Chronic respiratory failure with hypercapnia: Secondary | ICD-10-CM

## 2014-09-13 MED ORDER — SPIRONOLACTONE 25 MG PO TABS: ORAL_TABLET | ORAL | Status: DC

## 2014-09-13 NOTE — Progress Notes (Signed)
Patient Name: [name], [name] Date: 09/05/2014 Gender: Female D.O.B: Sep 05, 1955 Age (years): 58 Referring Provider: Renae Fickle Height (inches): 58 Interpreting Physician: Cyril Mourning MD, ABSM Weight (lbs): 141 RPSGT: Armen Pickup BMI: 29 MRN: [id number] Neck Size: 19.50   CLINICAL INFORMATION The patient is referred for a BiPAP titration to treat sleep apnea. NPSG 08/29/12:  Severe obstructive sleep apnea/hypopnea syndrome, with an AHI of 79 She had persistent events on CPAP, hence BiPAP titration was ordered  SLEEP STUDY TECHNIQUE As per the AASM Manual for the Scoring of Sleep and Associated Events v2.3 (April 2016) with a hypopnea requiring 4% desaturations.  The channels recorded and monitored were frontal, central and occipital EEG, electrooculogram (EOG), submentalis EMG (chin), nasal and oral airflow, thoracic and abdominal wall motion, anterior tibialis EMG, snore microphone, electrocardiogram, and pulse oximetry. Bilevel positive airway pressure (BPAP) was initiated at the beginning of the study and titrated to treat sleep-disordered breathing.  MEDICATIONS Medications administered by patient during sleep study : No sleep medicine administered.  RESPIRATORY PARAMETERS Optimal IPAP Pressure (cm): 25 AHI at Optimal Pressure (/hr) 47.7 Optimal EPAP Pressure (cm): 20   Overall Minimal O2 (%): 68.00 Minimal O2 at Optimal Pressure (%): 84.0 SLEEP ARCHITECTURE Start Time: 10:48:24 PM Stop Time: 4:51:35 AM Total Time (min): 363.2 Total Sleep Time (min): 347.4 Sleep Latency (min): 0.3 Sleep Efficiency (%): 95.7 REM Latency (min): 189.5 WASO (min): 15.5 Stage N1 (%): 9.48 Stage N2 (%): 82.61 Stage N3 (%): 1.15 Stage R (%): 6.76 Supine (%): 100.00 Arousal Index (/hr): 59.4       CARDIAC DATA The 2 lead EKG demonstrated sinus rhythm. The mean heart rate was 85.33 beats per minute. Other EKG findings include: Atrial Fibrillation and PVCs, Atrial Flutter.   LEG MOVEMENT  DATA The total Periodic Limb Movements of Sleep (PLMS) were 0. The PLMS index was 0.00. A PLMS index of <15 is considered normal in adults.  IMPRESSIONS An optimal BiPAP pressure was selected for this patient ( 25 /20 cm of water) Central sleep apnea was not noted during this titration (CAI = 4.8/h). Severe oxygen desaturations were observed during this titration (min O2 = 68.00%). The patient snored with Soft snoring volume. 2-lead EKG demonstrated: Atrial Fibrillation and PVCs, Atrial Flutter Clinically significant periodic limb movements were not noted during this study. Arousals associated with PLMs were rare.   DIAGNOSIS Obstructive Sleep Apnea (327.23 [G47.33 ICD-10])   RECOMMENDATIONS Trial of BiPAP therapy on 25/20 cm H2O with a Small size Resmed Full Face Mask AirFit F10 mask and heated humidification. This may be a high pressure and cause patient discomfort. Hence may prefer auto BiPAP. If persistent events on BiPAP download, ASV can be considered. Avoid alcohol, sedatives and other CNS depressants that may worsen sleep apnea and disrupt normal sleep architecture. Sleep hygiene should be reviewed to assess factors that may improve sleep quality. Weight management and regular exercise should be initiated or continued. Return  for re-evaluation after 4 weeks of therapy  Cyril Mourning MD. FCCP. [facility]

## 2014-09-13 NOTE — Patient Instructions (Addendum)
Aldactone 25 mg twice daily and the swelling should improve.   You will need your kidney function rechecked one week after you start the aldactone   Keep appt with Dr Vassie Loll

## 2014-09-13 NOTE — Progress Notes (Signed)
Patient ID: MARYBELLA ETHIER, female   DOB: January 05, 1956,  MRN: [id number]    Brief patient profile:  71 yowf never smoker with Turner's syndrome previously followed by Dr Shelle Iron for osa  On CPAP but dc'd on  bipap and 02 3lpm p admit:  Admit date: 08/27/2014 Discharge date: 09/04/2014  Recommendations for Outpatient Follow-up:  1. F/u with Dr.Hong Moring in 1 week and Dr. Vassie Loll in 3 months or sooner as needed for sleep study and PFTs 2. Started on bipap at night 20/10, rate 18, 3L  3. Home O2 continuous 3L 4. Started on diltiazem and metoprolol was decreased for rate control of a-fib 5. Started on anticoagulation 6. F/u with cardiology in 2 weeks for outpatient stress test 7. Repeat CT chest February 2017 to reevaluate pulmonary nodule 8. PCP in mid-September, please repeat TFTs at adjust synthroid at that time  Discharge Diagnoses:  Principal Problem:  Atrial fibrillation with RVR Active Problems:  Benign hypertensive heart disease without heart failure  Turner syndrome  Aortic insufficiency  Hypertension  OSA (obstructive sleep apnea)  New onset a-fib  Acute and chronic respiratory failure with hypoxia  Acute systolic congestive heart failure  Leukocytosis  Aspiration pneumonia  Pulmonary nodule  LAD (lymphadenopathy), mediastinal  Dyspnea  Adult hypothyroidism  Turner's syndrome  Acute on chronic respiratory failure with hypercapnia                History of present illness:  The patient is a 59 year old female with history of Turner syndrome, mild aortic valve insufficiency. She presented to her primary care doctor's office with respiratory distress and palpitations several days prior to admission. She was found to be in A. fib with RVR. She received 20 mg of Cardizem which caused her systolic blood pressure did drop to the 70s systolic. She was then transported by EMS to the emergency department.  8/24: CT angio chest: Lingular pneumonia, started  zosyn. Incidental pulm nodule. Transitioned off dilt gtt 8/25: Increasingly tachycardic but feeling better with diuresis 8/26: CO2 elevated and increased hypoxia. Bipap, diamox started 8/27: pulm consulted, bipap adjusted 8/28: D/c diamox. O2 weaned to 3L, bipap adjusted  Hospital Course:   Atrial fibrillation with rapid ventricular response, Heart rate of 130-140 initially. CHA2DS2-VASc Score and unadjusted Ischemic Stroke Rate (% per year) is equal to 2.2% stroke rate/year from a score of 2, female gender and hypertension. She was seen by cardiology and was initially placed on a diltiazem gtt but quickly transitioned to oral diltiazem. Her rate remained elevated so some metoprolol was resumed but at a lower dose than her home dose. She was anticoagulated with apixaban. ECHO demonstrated no regional wall motion abnormalities and cardiology felt that her a-fib may have been triggered by underlying pneumonia, however, they also recommended an outpatient stress test. Cardiology recommended TEE with cardioversion, however, the patient and her mother declined.   Moderate aortic insufficiency. 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 CT demonstrated no aneurysmal dilation of the thoracic aorta. Defer timing of cardiothoracic surgery consultation versus interventionalist consultation to cardiology as outpatient.   Acute hypoxic respiratory failure secondary to acute systolic heart failure and aspiration pneumonia. CT angio was negative for PE. ECHO demonstrated EF of 45-50%. She was diuresed and treated with antibiotics, but continued to require oxygen. She was initially diuresed with lasix, but due to metabolic alkalosis, was transitioned to IV diamox at the recommendation of pulmonology  for a few days. Her effusions and edema on CXR improved. She required 3L O2 at discharge during the day.  Her home lasix was increased to twice daily. She is on beta blocker. She will follow up with cardiology in a few weeks for stress test. Consider addition of ACEI if she is not hypotensive.   Severe OSA with hypoxia to the 70s and pauses longer than 20 seconds in her breathing overnight. She was seen by pulmonology who changed her cpap to bipap and adjusted her settings. Her recommended bipap settings at the time of discharge were 20/10, 3L.   Aspiration pneumonia v. CAP. Initially treated with broad spectrum antibiotics, but narrowed to unasyn. S. pneumo neg and legionella neg. She should continue a 7-day course of antibiotics with Augmentin, last doses on 8/30, then stop.   Pulmonary nodule and mild mediastinal lymphadenopathy probably secondary to edema and pneumonia. Will need repeat CT chest in 6 months, February of 2017.  Hypertension, BP low normal  - Adding metoprolol so monitor closely  Turner syndrome, with some typical features such as webbed neck, short stature, shield chest, but she does not appear to have a bicuspid aortic valve  Hypothyroidism, TSH 0.370 borderline low and free T4 mildly elevated at 1.43. Synthroid decreased to . Repeat TFTs in 4 weeks  Leukocytosis, afebrile. Possible pneumonia. UCx neg. Trended down with antibiotics.   Hypokalemia due to diuresis, resolved with oral potassium repletion.  Consultants:  Cardiology, Dr. Royann Shivers  Pulmonology, Drs Delford Field and Marchelle Gearing  Procedures:  Echocardiogram  CT angio chest  CXR  Antibiotics:  None  Discharge Exam: Filed Vitals:   09/04/14 0850  BP: 112/50  Pulse:   Temp:   Resp:    Filed Vitals:   09/04/14 0145 09/04/14 0558 09/04/14 0559 09/04/14 0850  BP:  115/47  112/50  Pulse: 74 77 65   Temp:  97.5 F (36.4 C)    TempSrc:  Oral    Resp: Height:      Weight:  67.586 kg (149 lb)    SpO2: 96% 96%  94%      General: Adult female, mildly SOB after getting up with physical therapy.   HEENT: NCAT, MMM, webbed neck   Cardiovascular: IRRR, nl S1, S2 no mrg, 2+ pulses, warm extremities  Respiratory: diminished at the bilateral bases, no wheezes or rhonchi.   Abdomen: NABS, soft, NT/ND  MSK: Normal tone and bulk, 1+ RLE edema  Neuro: Grossly moves all extremities  Discharge Instructions      Discharge Instructions    (HEART FAILURE PATIENTS) Call MD: Anytime you have any of the following symptoms: 1) 3 pound weight gain in 24 hours or 5 pounds in 1 week 2) shortness of breath, with or without a dry hacking cough 3) swelling in the hands, feet or stomach 4) if you have to sleep on extra pillows at night in order to breathe.  Complete by: As directed      Call MD for: difficulty breathing, headache or visual disturbances  Complete by: As directed      Call MD for: extreme fatigue  Complete by: As directed      Call MD for: hives  Complete by: As directed      Call MD for: persistant dizziness or light-headedness   Complete by: As directed      Call MD for: persistant nausea and vomiting  Complete by: As directed      Call MD for:  severe uncontrolled pain  Complete by: As directed      Call MD for: temperature >100.4  Complete by: As directed      Diet - low sodium heart healthy  Complete by: As directed      Discharge instructions  Complete by: As directed   Please continue Augmentin, your next dose is due this evening and your last doses are due on 8/30. Please start taking diltiazem for your heart rate. We have reduced your metoprolol to 12.5mg  twice a day instead of 25 mg once a day for now. Follow up with cardiology in 2 weeks to reevaluate these medications and schedule a stress test. Atrial fibrillation increases your risk of stroke. You have been started on  the blood thinner apixaban. If you notice bleeding, particularly blood in your stools or have any bleeding that will not stop, please come immediately to the hospital or call 911. You had a small nodule in your lung which may be due to pneumonia or fluid, but you will need a CT scan in February to make sure the nodule looks the same. Your thyroid hormone was a little elevated so I have reduced your synthroid slightly for the next few weeks. Please have your primary care doctor check you thyroid levels in 3-4 weeks at your next appointment and make further adjustments if needed.                09/13/2014 1st   office visit/ Jaedin Trumbo/ transition of care  re: chronic resp failure/ hypercabia secondary to OHS Chief Complaint  Patient presents with  . HFU    Pt states her breathing has improved some since hospital d/c, but not back to her normal baseline. She has had some swelling in her legs occ.     No obvious day to day or daytime variability or assoc chronic cough or cp or chest tightness, subjective wheeze or overt sinus or hb symptoms. No unusual exp hx or h/o childhood pna/ asthma or knowledge of premature birth.  Sleeping ok without nocturnal  or early am exacerbation  of respiratory  c/o's or need for noct saba. Also denies any obvious fluctuation of symptoms with weather or environmental changes or other aggravating or alleviating factors except as outlined above   Current Medications, Allergies, Complete Past Medical History, Past Surgical History, Family History, and Social History were reviewed in [employer] record.  ROS  The following are not active complaints unless bolded sore throat, dysphagia, dental problems, itching, sneezing,  nasal congestion or excess/ purulent secretions, ear ache,   fever, chills, sweats, unintended wt loss, classically pleuritic or exertional cp, hemoptysis,  orthopnea pnd or leg swelling, presyncope, palpitations, abdominal pain,  anorexia, nausea, vomiting, diarrhea  or change in bowel or bladder habits, change in stools or urine, dysuria,hematuria,  rash, arthralgias, visual complaints, headache, numbness, weakness or ataxia or problems with walking or coordination,  change in mood/affect or memory.      Objective:   Physical Exam  W/c bound wf nad on 3lpm   Wt Readings from Last 3 Encounters:  09/13/14 156 lb (70.761 kg)  09/05/14 141 lb (63.957 kg)  09/04/14 149 lb (67.586 kg)    Vital signs reviewed        HEENT: nl dentition, turbinates, and orophanx. Nl external ear canals without cough reflex   NECK :  Short/ turner's stature without JVD/Nodes/TM/ nl carotid upstrokes bilaterally   LUNGS: no acc muscle use, clear to A and  P bilaterally without cough on insp or exp maneuvers   CV:  RRR  no s3 or murmur or increase in P2,  1+ bialterally sym edema   ABD:  soft and nontender with nl excursion in the supine position. No bruits or organomegaly, bowel sounds nl  MS:  warm without deformities, calf tenderness, cyanosis or clubbing  SKIN: warm and dry without lesions    NEURO:  alert, approp, no deficits     I personally reviewed images and agree with radiology impression as follows:  CXR: 08/31/14 1. Resolved pulmonary edema. 2. No new lung abnormalities.    Chemistry      Component Value Date/Time   NA 135 09/04/2014 0537   K 3.5 09/04/2014 0537   CL 97* 09/04/2014 0537   CO2 31 09/04/2014 0537   BUN 17 09/04/2014 0537   CREATININE 0.62 09/04/2014 0537      Component Value Date/Time   CALCIUM 8.5* 09/04/2014 0537   ALKPHOS 115 08/29/2014 0800   AST 40 08/29/2014 0800   ALT 78* 08/29/2014 0800   BILITOT 0.9 08/29/2014 0800          Assessment:

## 2014-09-14 ENCOUNTER — Telehealth: Payer: Self-pay | Admitting: Cardiology

## 2014-09-14 NOTE — Telephone Encounter (Signed)
New message    Pt went to Dr Sherene Sires yesterday and legs swollen Pt already takes furosemide and Dr Sherene Sires added spironolactone for the swelling Please call to discuss

## 2014-09-14 NOTE — Telephone Encounter (Signed)
Spoke with mother and she just wanted  Dr. Patty Sermons to be aware of changes Will forward for review

## 2014-09-15 NOTE — Telephone Encounter (Signed)
Okay with change

## 2014-09-16 DIAGNOSIS — E669 Obesity, unspecified: Secondary | ICD-10-CM

## 2014-09-16 HISTORY — DX: Obesity, unspecified: E66.9

## 2014-09-16 NOTE — Assessment & Plan Note (Addendum)
-   05/2012 > 2lpm started at [facility] with HCO3 33-35 on bmets c/w hypercarbia. - PSS 08/29/12 Severe obstructive sleep apnea/hypopnea syndrome, with an AHI of 79 > see  - Placed on 24h 02 3lpm at d/c 09/04/14 and bipap with f/u per Vassie Loll planned  Complicated now by fluid retention and  AI/afib with TDS but could also very well have PH  rec add aldactone 25 bid and recheck bmet in 1 week  No change 02/ bipap for now  I had an extended discussion with the patient and mom reviewing all relevant studies completed to date and  lasting 25 minutes of a visit    Each maintenance medication was reviewed in detail including most importantly the difference between maintenance and prns and under what circumstances the prns are to be triggered using an action plan format that is not reflected in the computer generated alphabetically organized AVS.    Please see instructions for details which were reviewed in writing and the patient given a copy highlighting the part that I personally wrote and discussed at today's ov.

## 2014-09-16 NOTE — Assessment & Plan Note (Signed)
Body mass index is 34.99    Lab Results  Component Value Date   TSH 0.370 08/28/2014     Contributing to gerd tendency/ doe/reviewed need  achieve and maintain neg calorie balance > defer f/u primary care including intermittently monitoring thyroid status

## 2014-09-16 NOTE — Assessment & Plan Note (Addendum)
Echo    08/28/14 Left ventricle: The cavity size was normal. Wall thickness was increased in a pattern of mild LVH. Systolic function was mildly reduced. The estimated ejection fraction was in the range of 45% to 50%. Diffuse hypokinesis. - Aortic valve: There was moderate regurgitation. - Ascending aorta: The ascending aorta was mildly dilated. - Mitral valve: Calcified annulus. There was mild regurgitation. - Left atrium: The atrium was severely dilated. - Right ventricle: Systolic function was mildly reduced. - Right atrium: The atrium was mildly dilated. - Pulmonary arteries: Systolic pressure was mildly increased. PA peak pressure: 43 mm Hg (S).  Likely decompensated with RAF Added aldactone as still edematous on lasix bid - See instructions for specific recommendations which were reviewed directly with the patient who was given a copy with highlighter outlining the key components.

## 2014-09-16 NOTE — Assessment & Plan Note (Signed)
NPSG 08/29/12:  Severe obstructive sleep apnea/hypopnea syndrome, with an AHI of 79 Download 04/2013:  Good compliance, AHI 17/hr, ++mask leaks Download 06/2014:  Good compliance, AHI 12 with significant mask leak - placed on bipap at d/c 09/04/14 > f/u Vassie Loll planned  Reviewed plan of care with pt/ mother 09/13/2014

## 2014-09-17 ENCOUNTER — Telehealth: Payer: Self-pay | Admitting: Internal Medicine

## 2014-09-17 ENCOUNTER — Telehealth: Payer: Self-pay | Admitting: Pulmonary Disease

## 2014-09-17 NOTE — Telephone Encounter (Signed)
Advised mother

## 2014-09-17 NOTE — Telephone Encounter (Signed)
Error wrong dr.Stanley A Dalton ° ° °

## 2014-09-17 NOTE — Telephone Encounter (Signed)
Called # listed and number rings busy x 3 wcb

## 2014-09-17 NOTE — Telephone Encounter (Signed)
[name] with [facility] called.  Patient was going to be set up with a bipap that the hospitalist ordered.  She states the patient's mother is wanting to speak to someone here and she wanted to be sure we knew.

## 2014-09-17 NOTE — Telephone Encounter (Signed)
Called and spoke with pt's mother Mother wanted to make sure that Dr Vassie Loll was the one that placed order for pt's BIPAP Informed mother that per RA notes, Dr Vassie Loll is aware that pt is to begin BiPAP  Mother asked if [facility] could come to their home to pick up old CPAP and deliver new BIPAP Informed mother that I would contact [name] with [facility] to ask and have [facility] call them back directly.   Spoke with [name] from [facility] and was told she would contact pt about delivery  Nothing further is needed

## 2014-09-20 ENCOUNTER — Telehealth: Payer: Self-pay | Admitting: Pulmonary Disease

## 2014-09-20 NOTE — Telephone Encounter (Signed)
Samyria's mom is returning phone call - [phone].

## 2014-09-20 NOTE — Telephone Encounter (Signed)
Called and spoke with pt's mother Pt inquiring if [facility] had received orders for pt to start BiPap machine Informed pt's mother that order has been sent to [facility] Mother voiced no other concerns  Nothing further is needed at this time

## 2014-09-20 NOTE — Telephone Encounter (Signed)
lmtcb X1 for pt's mother 

## 2014-09-21 ENCOUNTER — Telehealth: Payer: Self-pay | Admitting: Cardiology

## 2014-09-21 NOTE — Telephone Encounter (Signed)
New message      Saw PCP this am.  BP was a little low. Want [name] to know

## 2014-09-21 NOTE — Telephone Encounter (Signed)
Blood pressure 130's/74 and concerned that was low Advised patient blood pressure ok

## 2014-09-24 ENCOUNTER — Telehealth: Payer: Self-pay | Admitting: Cardiology

## 2014-09-24 NOTE — Telephone Encounter (Signed)
Busy, will try again

## 2014-09-24 NOTE — Telephone Encounter (Signed)
Mother just wanted to let  Dr. Patty Sermons know patient was given Tramadol by PCP for ankle/lower leg pain Patient did see PCP Friday regarding leg

## 2014-09-24 NOTE — Telephone Encounter (Signed)
New message   Pt c/o swelling: STAT is pt has developed SOB within 24 hours  1. How long have you been experiencing swelling? Last week  2. Where is the swelling located? Both legs  3.  Are you currently taking a "fluid pill"? furosemide and spironolactone   4.  Are you currently SOB? No  5.  Have you traveled recently?No  Pt having swelling and pain in left leg around calf and ankle

## 2014-09-27 ENCOUNTER — Telehealth: Payer: Self-pay | Admitting: Cardiology

## 2014-09-27 MED ORDER — DILTIAZEM HCL ER COATED BEADS 360 MG PO CP24: 360.0000 mg | ORAL_CAPSULE | Freq: Every day | ORAL | Status: DC

## 2014-09-27 NOTE — Telephone Encounter (Signed)
Refilled as requested  

## 2014-09-27 NOTE — Telephone Encounter (Signed)
New message      Calling to see if Dr Patty Sermons will refill diltiazem ER   cap.  She was prescribed this while in the hosp.  Please call

## 2014-10-01 NOTE — Telephone Encounter (Signed)
Okay 

## 2014-10-02 ENCOUNTER — Telehealth: Payer: Self-pay | Admitting: *Deleted

## 2014-10-02 MED ORDER — APIXABAN 5 MG PO TABS: 5.0000 mg | ORAL_TABLET | Freq: Two times a day (BID) | ORAL | Status: DC

## 2014-10-02 NOTE — Telephone Encounter (Signed)
Wants eliquis refill, was in the hospital and they put her on it, Winter Jocelyn called and wants you to call her back bc she wants Dr. Patty Sermons to pick up the RX, please call her @ the number in this chart, thanks

## 2014-10-02 NOTE — Telephone Encounter (Signed)
Spoke with mother and will send over Rx For Eliquis

## 2014-10-05 ENCOUNTER — Other Ambulatory Visit: Payer: Self-pay

## 2014-10-05 MED ORDER — LEVOTHYROXINE SODIUM 88 MCG PO TABS: 88.0000 ug | ORAL_TABLET | Freq: Every day | ORAL | Status: DC

## 2014-10-09 ENCOUNTER — Telehealth: Payer: Self-pay | Admitting: Internal Medicine

## 2014-10-09 DIAGNOSIS — J9612 Chronic respiratory failure with hypercapnia: Secondary | ICD-10-CM

## 2014-10-09 NOTE — Telephone Encounter (Signed)
Pt mother called in. Requesting an order for pt to be evaluated for a POC through [facility]. Please advise MW thanks

## 2014-10-09 NOTE — Telephone Encounter (Signed)
Order has been placed for O2 titration. Pt's mother is aware. Nothing further was needed.

## 2014-10-09 NOTE — Telephone Encounter (Signed)
Fine with me to do ambulatory 02 titration

## 2014-10-10 ENCOUNTER — Telehealth: Payer: Self-pay | Admitting: Cardiology

## 2014-10-10 NOTE — Telephone Encounter (Signed)
New Message      Pt's mother calling stating that she needs for [name] to call her back asap because her daughter is having a problem, will not disclose what issues are going on. Please call back and advise.

## 2014-10-10 NOTE — Telephone Encounter (Signed)
Spoke with patients mother and patient has been having problems with getting "gagged on sputum" Patient has appointment with PCP tomorrow, office closed today Nurse at PCP recommended Mucinex and mother wanted to make sure this was ok  Advised mother ok to use Mucinex plain twice a day as needed

## 2014-10-11 ENCOUNTER — Telehealth: Payer: Self-pay | Admitting: Internal Medicine

## 2014-10-11 NOTE — Telephone Encounter (Signed)
Spoke with pt's mother. Advised her that she needs to follow MW's recommendations. She verbalized understanding. Nothing further was needed.

## 2014-10-12 ENCOUNTER — Inpatient Hospital Stay (HOSPITAL_COMMUNITY)
Admission: AD | Admit: 2014-10-12 | Discharge: 2014-10-16 | DRG: 641 | Disposition: A | Discharge status: Home / Self Care | Payer: Medicaid Other | Source: Ambulatory Visit | Attending: Internal Medicine | Admitting: Internal Medicine

## 2014-10-12 ENCOUNTER — Telehealth: Payer: Self-pay | Admitting: Cardiology

## 2014-10-12 DIAGNOSIS — Q969 Turner's syndrome, unspecified: Secondary | ICD-10-CM | POA: Diagnosis not present

## 2014-10-12 DIAGNOSIS — R911 Solitary pulmonary nodule: Secondary | ICD-10-CM | POA: Diagnosis present

## 2014-10-12 DIAGNOSIS — J9612 Chronic respiratory failure with hypercapnia: Secondary | ICD-10-CM | POA: Diagnosis present

## 2014-10-12 DIAGNOSIS — Z9981 Dependence on supplemental oxygen: Secondary | ICD-10-CM | POA: Diagnosis not present

## 2014-10-12 DIAGNOSIS — I10 Essential (primary) hypertension: Secondary | ICD-10-CM | POA: Diagnosis present

## 2014-10-12 DIAGNOSIS — E871 Hypo-osmolality and hyponatremia: Secondary | ICD-10-CM | POA: Diagnosis present

## 2014-10-12 DIAGNOSIS — I5032 Chronic diastolic (congestive) heart failure: Secondary | ICD-10-CM

## 2014-10-12 DIAGNOSIS — G4733 Obstructive sleep apnea (adult) (pediatric): Secondary | ICD-10-CM | POA: Diagnosis present

## 2014-10-12 DIAGNOSIS — I480 Paroxysmal atrial fibrillation: Secondary | ICD-10-CM | POA: Diagnosis present

## 2014-10-12 DIAGNOSIS — E032 Hypothyroidism due to medicaments and other exogenous substances: Secondary | ICD-10-CM | POA: Diagnosis not present

## 2014-10-12 DIAGNOSIS — E860 Dehydration: Secondary | ICD-10-CM | POA: Diagnosis present

## 2014-10-12 DIAGNOSIS — T501X5A Adverse effect of loop [high-ceiling] diuretics, initial encounter: Secondary | ICD-10-CM | POA: Diagnosis present

## 2014-10-12 DIAGNOSIS — N390 Urinary tract infection, site not specified: Secondary | ICD-10-CM | POA: Diagnosis present

## 2014-10-12 DIAGNOSIS — J9611 Chronic respiratory failure with hypoxia: Secondary | ICD-10-CM | POA: Diagnosis present

## 2014-10-12 DIAGNOSIS — R1110 Vomiting, unspecified: Secondary | ICD-10-CM

## 2014-10-12 DIAGNOSIS — R590 Localized enlarged lymph nodes: Secondary | ICD-10-CM | POA: Diagnosis present

## 2014-10-12 DIAGNOSIS — R198 Other specified symptoms and signs involving the digestive system and abdomen: Secondary | ICD-10-CM | POA: Insufficient documentation

## 2014-10-12 DIAGNOSIS — E039 Hypothyroidism, unspecified: Secondary | ICD-10-CM | POA: Diagnosis present

## 2014-10-12 DIAGNOSIS — B961 Klebsiella pneumoniae [K. pneumoniae] as the cause of diseases classified elsewhere: Secondary | ICD-10-CM | POA: Diagnosis present

## 2014-10-12 DIAGNOSIS — I110 Hypertensive heart disease with heart failure: Secondary | ICD-10-CM | POA: Diagnosis present

## 2014-10-12 DIAGNOSIS — I5022 Chronic systolic (congestive) heart failure: Secondary | ICD-10-CM | POA: Diagnosis present

## 2014-10-12 DIAGNOSIS — M1990 Unspecified osteoarthritis, unspecified site: Secondary | ICD-10-CM | POA: Diagnosis present

## 2014-10-12 DIAGNOSIS — K449 Diaphragmatic hernia without obstruction or gangrene: Secondary | ICD-10-CM | POA: Diagnosis present

## 2014-10-12 DIAGNOSIS — J392 Other diseases of pharynx: Secondary | ICD-10-CM | POA: Diagnosis not present

## 2014-10-12 DIAGNOSIS — Z96649 Presence of unspecified artificial hip joint: Secondary | ICD-10-CM

## 2014-10-12 LAB — COMPREHENSIVE METABOLIC PANEL
ALBUMIN: 3.6 g/dL (ref 3.5–5.0)
ALK PHOS: 96 U/L (ref 38–126)
ALT: 14 U/L (ref 14–54)
ANION GAP: 11 (ref 5–15)
AST: 22 U/L (ref 15–41)
BILIRUBIN TOTAL: 1.4 mg/dL — AB (ref 0.3–1.2)
BUN: 23 mg/dL — ABNORMAL HIGH (ref 6–20)
CALCIUM: 9.4 mg/dL (ref 8.9–10.3)
CO2: 27 mmol/L (ref 22–32)
Chloride: 84 mmol/L — ABNORMAL LOW (ref 101–111)
Creatinine, Ser: 0.85 mg/dL (ref 0.44–1.00)
GLUCOSE: 91 mg/dL (ref 65–99)
POTASSIUM: 4.4 mmol/L (ref 3.5–5.1)
Sodium: 122 mmol/L — ABNORMAL LOW (ref 135–145)
TOTAL PROTEIN: 6.6 g/dL (ref 6.5–8.1)

## 2014-10-12 LAB — CBC WITH DIFFERENTIAL/PLATELET
BASOS ABS: 0 10*3/uL (ref 0.0–0.1)
BASOS PCT: 0 %
Eosinophils Absolute: 0 10*3/uL (ref 0.0–0.7)
Eosinophils Relative: 0 %
HEMATOCRIT: 38 % (ref 36.0–46.0)
HEMOGLOBIN: 14 g/dL (ref 12.0–15.0)
LYMPHS PCT: 13 %
Lymphs Abs: 1.7 10*3/uL (ref 0.7–4.0)
MCH: 33.2 pg (ref 26.0–34.0)
MCHC: 36.8 g/dL — ABNORMAL HIGH (ref 30.0–36.0)
MCV: 90 fL (ref 78.0–100.0)
MONO ABS: 2.1 10*3/uL — AB (ref 0.1–1.0)
Monocytes Relative: 15 %
NEUTROS ABS: 9.7 10*3/uL — AB (ref 1.7–7.7)
NEUTROS PCT: 72 %
Platelets: 412 10*3/uL — ABNORMAL HIGH (ref 150–400)
RBC: 4.22 MIL/uL (ref 3.87–5.11)
RDW: 13 % (ref 11.5–15.5)
WBC: 13.5 10*3/uL — AB (ref 4.0–10.5)

## 2014-10-12 LAB — URINALYSIS, ROUTINE W REFLEX MICROSCOPIC
BILIRUBIN URINE: NEGATIVE
Glucose, UA: NEGATIVE mg/dL
Ketones, ur: NEGATIVE mg/dL
NITRITE: NEGATIVE
PROTEIN: NEGATIVE mg/dL
SPECIFIC GRAVITY, URINE: 1.02 (ref 1.005–1.030)
UROBILINOGEN UA: 1 mg/dL (ref 0.0–1.0)
pH: 6 (ref 5.0–8.0)

## 2014-10-12 LAB — BASIC METABOLIC PANEL
ANION GAP: 11 (ref 5–15)
BUN: 22 mg/dL — ABNORMAL HIGH (ref 6–20)
CALCIUM: 8.6 mg/dL — AB (ref 8.9–10.3)
CO2: 27 mmol/L (ref 22–32)
Chloride: 83 mmol/L — ABNORMAL LOW (ref 101–111)
Creatinine, Ser: 0.93 mg/dL (ref 0.44–1.00)
GLUCOSE: 170 mg/dL — AB (ref 65–99)
POTASSIUM: 3.9 mmol/L (ref 3.5–5.1)
Sodium: 121 mmol/L — ABNORMAL LOW (ref 135–145)

## 2014-10-12 LAB — URINE MICROSCOPIC-ADD ON

## 2014-10-12 LAB — OSMOLALITY, URINE: OSMOLALITY UR: 609 mosm/kg (ref 390–1090)

## 2014-10-12 LAB — CORTISOL: CORTISOL PLASMA: 23.7 ug/dL

## 2014-10-12 LAB — OSMOLALITY: OSMOLALITY: 261 mosm/kg — AB (ref 275–300)

## 2014-10-12 LAB — SODIUM, URINE, RANDOM: Sodium, Ur: 20 mmol/L

## 2014-10-12 LAB — TSH: TSH: 3.481 u[IU]/mL (ref 0.350–4.500)

## 2014-10-12 LAB — CREATININE, URINE, RANDOM: CREATININE, URINE: 91.12 mg/dL

## 2014-10-12 MED ORDER — ONDANSETRON HCL 4 MG PO TABS: 4.0000 mg | ORAL_TABLET | Freq: Four times a day (QID) | ORAL | Status: DC | PRN

## 2014-10-12 MED ORDER — SALINE SPRAY 0.65 % NA SOLN
1.0000 | NASAL | Status: DC | PRN
  Filled 2014-10-12: qty 44

## 2014-10-12 MED ORDER — SENNOSIDES-DOCUSATE SODIUM 8.6-50 MG PO TABS: 1.0000 | ORAL_TABLET | Freq: Every evening | ORAL | Status: DC | PRN

## 2014-10-12 MED ORDER — BISACODYL 10 MG RE SUPP
10.0000 mg | Freq: Every day | RECTAL | Status: DC | PRN
Start: 2014-10-12 — End: 2014-10-16

## 2014-10-12 MED ORDER — LEVOTHYROXINE SODIUM 88 MCG PO TABS: 88.0000 ug | ORAL_TABLET | Freq: Every day | ORAL | Status: DC

## 2014-10-12 MED ORDER — SODIUM CHLORIDE 0.9 % IV SOLN
INTRAVENOUS | Status: AC
  Administered 2014-10-12: 17:00:00 via INTRAVENOUS

## 2014-10-12 MED ORDER — LEVOTHYROXINE SODIUM 100 MCG PO TABS
100.0000 ug | ORAL_TABLET | Freq: Every day | ORAL | Status: DC
  Administered 2014-10-13 – 2014-10-16 (×4): 100 ug via ORAL
  Filled 2014-10-12 (×5): qty 1

## 2014-10-12 MED ORDER — ACETAMINOPHEN 325 MG PO TABS: 650.0000 mg | ORAL_TABLET | Freq: Four times a day (QID) | ORAL | Status: DC | PRN

## 2014-10-12 MED ORDER — ONDANSETRON HCL 4 MG/2ML IJ SOLN: 4.0000 mg | Freq: Four times a day (QID) | INTRAMUSCULAR | Status: DC | PRN

## 2014-10-12 MED ORDER — HYDROCERIN EX CREA
TOPICAL_CREAM | Freq: Two times a day (BID) | CUTANEOUS | Status: DC
  Administered 2014-10-12: 22:00:00 via TOPICAL
  Administered 2014-10-12: 1 via TOPICAL
  Administered 2014-10-13 (×2): via TOPICAL
  Administered 2014-10-14 – 2014-10-15 (×3): 1 via TOPICAL
  Administered 2014-10-15 – 2014-10-16 (×2): via TOPICAL
  Filled 2014-10-12: qty 113

## 2014-10-12 MED ORDER — GUAIFENESIN ER 600 MG PO TB12
1200.0000 mg | ORAL_TABLET | Freq: Two times a day (BID) | ORAL | Status: DC
  Administered 2014-10-12 – 2014-10-16 (×7): 1200 mg via ORAL
  Filled 2014-10-12 (×6): qty 2

## 2014-10-12 MED ORDER — TRAMADOL HCL 50 MG PO TABS: 50.0000 mg | ORAL_TABLET | Freq: Four times a day (QID) | ORAL | Status: DC | PRN

## 2014-10-12 MED ORDER — ALUM & MAG HYDROXIDE-SIMETH 200-200-20 MG/5ML PO SUSP: 30.0000 mL | Freq: Four times a day (QID) | ORAL | Status: DC | PRN

## 2014-10-12 MED ORDER — METOPROLOL TARTRATE 12.5 MG HALF TABLET: 12.5000 mg | ORAL_TABLET | Freq: Two times a day (BID) | ORAL | Status: DC

## 2014-10-12 MED ORDER — APIXABAN 5 MG PO TABS
5.0000 mg | ORAL_TABLET | Freq: Two times a day (BID) | ORAL | Status: DC
Start: 2014-10-12 — End: 2014-10-14
  Administered 2014-10-12 – 2014-10-13 (×4): 5 mg via ORAL
  Filled 2014-10-12 (×4): qty 1

## 2014-10-12 MED ORDER — ACETAMINOPHEN 650 MG RE SUPP: 650.0000 mg | Freq: Four times a day (QID) | RECTAL | Status: DC | PRN

## 2014-10-12 MED ORDER — FLUTICASONE PROPIONATE 50 MCG/ACT NA SUSP
2.0000 | Freq: Every day | NASAL | Status: DC
  Administered 2014-10-12 – 2014-10-16 (×5): 2 via NASAL
  Filled 2014-10-12: qty 16

## 2014-10-12 MED ORDER — VITAMIN D 1000 UNITS PO TABS
2000.0000 [IU] | ORAL_TABLET | Freq: Every day | ORAL | Status: DC
Start: 2014-10-12 — End: 2014-10-16
  Administered 2014-10-12 – 2014-10-16 (×5): 2000 [IU] via ORAL
  Filled 2014-10-12 (×7): qty 2

## 2014-10-12 MED ORDER — METOPROLOL TARTRATE 12.5 MG HALF TABLET
12.5000 mg | ORAL_TABLET | Freq: Two times a day (BID) | ORAL | Status: DC
  Administered 2014-10-12 – 2014-10-16 (×8): 12.5 mg via ORAL
  Filled 2014-10-12 (×8): qty 1

## 2014-10-12 MED ORDER — DILTIAZEM HCL ER COATED BEADS 180 MG PO CP24: 360.0000 mg | ORAL_CAPSULE | Freq: Every day | ORAL | Status: DC

## 2014-10-12 MED ORDER — ENOXAPARIN SODIUM 40 MG/0.4ML ~~LOC~~ SOLN: 40.0000 mg | SUBCUTANEOUS | Status: DC

## 2014-10-12 MED ORDER — SODIUM CHLORIDE 0.9 % IJ SOLN
3.0000 mL | Freq: Two times a day (BID) | INTRAMUSCULAR | Status: DC
  Administered 2014-10-13 – 2014-10-16 (×6): 3 mL via INTRAVENOUS

## 2014-10-12 MED ORDER — ZOLPIDEM TARTRATE 5 MG PO TABS: 5.0000 mg | ORAL_TABLET | Freq: Every evening | ORAL | Status: DC | PRN

## 2014-10-12 NOTE — Telephone Encounter (Signed)
Spoke with patients mother regarding patients appointment with PCP yesterday Patient is being admitted to the hospital for low sodium

## 2014-10-12 NOTE — Progress Notes (Signed)
Pt uses BIPAP at bedtime. It as at bedside. Pt was not ready to go at @ 2145. Pt said she will let RN know when she is ready. RN will call RT.

## 2014-10-12 NOTE — H&P (Signed)
[facility] History and Physical                                                                                    [name], is a 59 y.o. female  MRN: [id number]   DOB - 1955/04/24  Admit Date - 10/12/2014  Outpatient Primary MD for the patient is ANDY,CAMILLE L, MD  Pulmonologist:  Dr. Sherene Sires.  Referring Physician:  Dr. Mardelle Matte  Chief Complaint:  Abnormal labs   HPI  [name]  is a 59 y.o. female, with turner's syndrome, mod aortic insufficiency, and hypothyroidism, who was admitted in August with new onset afib, systolic heart failure and aspiration pneumonia.  She presents as a direct admit from home due to a sodium of 121 drawn at her PCP's office on 10/11/2014.  History is gathered from both the patient and her mother as the patient is slightly difficult to understand.  The patient does not report feeling poorly, but does complain of "gagging" up clear sputum several times a day.  This started approximately 1 week ago.  It sometimes occurs when she is eating and sometimes occurs just at rest.  She denies chest pain, abdominal pain, changes in bowel movements and dysuria.  She has had one episode of vomiting when she ate a chicken salad sandwich for dinner last night.  The patient denies hematemesis and hemoptysis. She has not had cough or fever.   With regard to the low sodium - the patient and her mother have noticed no confusion, and they do not complain of seizure activity.  The patient was started on lasix in August 2016 and was started on spironolactone on 9/8.  Her thyroid medication dose was decreased in August 2016.  She has no known history of hyponatremia.  The patient was a direct admit.  CBC, CMET, TSH, Cortisol, U/A, Urine studies, CXR and EKG are pending.  Review of Systems   In addition to the HPI above, ++ Per patient her leg swelling has decreased while on spironolactone and lasix.  Her right leg has been swollen more than her left leg for many years. No  Fever-chills, No Headache, No changes with Vision or hearing, No Chest pain, Cough or Shortness of Breath, No Abdominal pain, No Nausea, Bowel movements are regular, No Blood in stool or Urine, No dysuria, No new skin rashes or bruises, No new joints pains-aches,  No new weakness, tingling, numbness in any extremity, No recent weight gain or loss, A full 10 point Review of Systems was done, except as stated above, all other Review of Systems were negative.  Past Medical History  Past Medical History  Diagnosis Date  . Turner's syndrome   . Aortic insufficiency     a. Mod AI by echo 2013.  Marland Kitchen Hypothyroidism   . Periorbital edema   . Hypertension   . Cervix abnormality 1993    MILD ATYPICAL ADENOMATOUS HYPERPLASIA OF CERVIX  . Dermatitis 05-26-12    all over  since 9'13  . Edema of both legs     a. right greater than left, due to Turner's syndrome.  Marland Kitchen Speech disorder     occ. "stutter"  .  Difficult intubation     will plan on spinal anesthesia"small mouth/airway"  . Abdominal aortic stenosis     a. Mild AS bye cho 2013.  Marland Kitchen Acute systolic congestive heart failure 08/29/2014    Past Surgical History  Procedure Laterality Date  . Total hip arthroplasty      LEFT  . US echocardiography  06/20/2009    EF 55-60%  . Dilation and curettage of uterus  1993    CONE BIOPSY  . Cervical cone biopsy  1993  . Hernia repair    . Colposcopy    . Joint replacement      hip replacement  . Total knee arthroplasty Right 05/31/2012    Procedure: RIGHT TOTAL KNEE ARTHROPLASTY;  Surgeon: Shelda Pal, MD;  Location: [facility];  Service: Orthopedics;  Laterality: Right;      Social History Social History  Substance Use Topics  . Smoking status: Never Smoker   . Smokeless tobacco: Never Used  . Alcohol Use: No  Lives at home with her mother.  Walked independently prior to august of 2016, now uses a walker.  Has been on 3L of oxygen 24/7 since august 2016.  Family History Family History   Problem Relation Age of Onset  . Hypertension Mother   . Cancer Mother     SKIN CANCER  . Breast cancer Cousin     MATERNAL COUSIN  . Diabetes Maternal Uncle   . Atrial fibrillation Mother     Prior to Admission medications   Medication Sig Start Date End Date Taking? Authorizing Provider  apixaban (ELIQUIS) 5 MG TABS tablet Take 1 tablet (5 mg total) by mouth 2 (two) times daily. 10/02/14   Cassell Clement, MD  Cholecalciferol (VITAMIN D3) 2000 UNITS capsule Take 2,000 Units by mouth daily.    Historical Provider, MD  diltiazem (CARDIZEM CD) 360 MG 24 hr capsule Take 1 capsule (360 mg total) by mouth daily. 09/27/14   Cassell Clement, MD  furosemide (LASIX) 40 MG tablet Take 1 tablet (40 mg total) by mouth 2 (two) times daily. 09/04/14   Renae Fickle, MD  KLOR-CON M10 10 MEQ tablet TAKE ONE TABLET BY MOUTH TWICE DAILY 08/13/14   Cassell Clement, MD  levothyroxine (SYNTHROID, LEVOTHROID) 88 MCG tablet Take 1 tablet (88 mcg total) by mouth daily before breakfast. 10/05/14   Cassell Clement, MD  metoprolol tartrate (LOPRESSOR) 25 MG tablet Take 0.5 tablets (12.5 mg total) by mouth 2 (two) times daily. 09/03/14   Renae Fickle, MD  spironolactone (ALDACTONE) 25 MG tablet One twice daily 09/13/14   Nyoka Cowden, MD  traMADol (ULTRAM) 50 MG tablet Take by mouth as needed for moderate pain.    Historical Provider, MD    Allergies  Allergen Reactions  . Cephalexin     Prefers not to have ? Psoriasis skin condition    Physical Exam  Vitals  Blood pressure 84/62, pulse 79, temperature 98.4 F (36.9 C), temperature source Oral, resp. rate 18, weight 62.732 kg (138 lb 4.8 oz), SpO2 98 %.   General: pleasant female,  lying in bed in NAD, mother at bedside.  Stutters.    Psych:  Normal affect and insight, Not Suicidal or Homicidal, Awake Alert, Oriented X 3.  Neuro:   No F.N deficits, ALL C.Nerves Intact, Strength 5/5 all 4 extremities, Sensation intact all 4 extremities.  ENT:   Ears and Eyes appear Normal, Conjunctivae clear, PER. Moist oral mucosa without erythema or exudates.  Neck:  Thick, no  lymphadenopathy.  Respiratory:  Symmetrical chest wall movement, Good air movement bilaterally, CTAB.  Cardiac:  RRR, No Murmurs, no jvp.  RLE + edema.  Abdomen:  Positive bowel sounds, Soft, Non tender, Non distended,  No masses appreciated  Skin:  No Cyanosis, Normal Skin Turgor, No Skin Rash or Bruise. Skin generally dry and rough  Extremities:  Able to move all 4. 5/5 strength in each,  no effusions.  Data Review Per PCP sodium was 121 on 10/6.  Urinalysis : pending  Imaging results:   No results found.  My personal review of EKG: pending   Assessment & Plan  Principal Problem:   Hyponatremia Active Problems:   Pulmonary nodule   LAD (lymphadenopathy), mediastinal   Osteoarthritis   Hypertension   OSA (obstructive sleep apnea)   H/O total hip arthroplasty   Adult hypothyroidism   Turner's syndrome   Atrial fibrillation (HCC)   Chronic systolic congestive heart failure (HCC)   Hyponatremia Likely due to diuretics.  Will also investigate for SIADH, recurrent aspiration pna.  Concerned about known pulmonary nodules and mediastinal lymphadenopathy.   Check urine na, urine creatinine, urine osmols, serum osmols and bmet, cortisol, TSH, Chest xray.  Patient appears euvolemic or slightly hypovolemic but not fluid overloaded - doubt CHF.  Weight is down to 138 today from 149 on 8/30. Will hold diuretics for now and start gentle IVF after urine and serum labs have been collected.  Systolic Heart Failure Currently stable.  Diagnosed 08/2014.    08/28/14 echo results:  Left ventricle: The cavity size was normal. Wall thickness was increased in a pattern of mild LVH. Systolic function was mildly reduced. The estimated ejection fraction was in the range of 45% to 50%. Diffuse hypokinesis. - Aortic valve: There was moderate regurgitation. - Ascending  aorta: The ascending aorta was mildly dilated. - Mitral valve: Calcified annulus. There was mild regurgitation. - Left atrium: The atrium was severely dilated. - Right ventricle: Systolic function was mildly reduced. - Right atrium: The atrium was mildly dilated. - Pulmonary arteries: Systolic pressure was mildly increased. PA peak pressure: 43 mm Hg (S).  Hold diuretics due to hyponatremia.  Continue metoprolol with hold parameters due to current hypotension.  Is not currently on an Ace-I. 09/04/14 67.586 kg (149 lb)  06/06/14 64.774 kg (142 lb 12.8 oz)  03/20/14 66.134 kg (145 lb 12.8 oz)        10/12/14 1300  Weight: 62.732 kg (138 lb 4.8 oz)    Hypotension in the setting of chronic hypertension. Likely due to dehydration.  Will give gentle IVF.  Hold diltiazem for now.  Place hold parameters on metoprolol.  Regurgitation of saliva / gagging Uncertain etiology.  Will check esophagram to look for stenosis or obstruction.  Check swallow eval as this may be continued aspiration.  Will also check cortisol / TSH.  Aspiration precautions.  In case this is due to post nasal drip, we will order nasal saline spray, flonase, and mucinex.  Atrial Fibrillation  Currently rate controlled.  On Eliquis.  Will continue. Need to place hold parameters on metoprolol and temporarily hold diltiazem due to low BP.  Hypothyroidism Recently had synthroid dose adjusted down.  TSH one month ago was 0.370.  Continue current synthroid dose.  Severe OSA Continue BIPAP QHS.  Settings are in Dr. Thurston Hole recent office note.  Chronic Hypoxia Continue oxygen via N/C at 3L.  Turners Syndrome Presumably stable.     Consultants Called:  none  Family Communication:  Mother at bedside.  Code Status:  Full  Condition:  Stable  Potential Disposition:  To home in 2-3 days if sodium improves and patient remains other wise stable.  Time spent in minutes : [address]   [facility] Algis Downs,  [place] on 10/12/2014 at 4:25 PM Between 7am to 7pm - Pager - [phone] After 7pm go to [email or web address] - password [email or web address] And look for the night coverage person covering me after hours

## 2014-10-12 NOTE — Telephone Encounter (Signed)
New message      Talk to [name] about a problem [name] is having

## 2014-10-12 NOTE — Progress Notes (Signed)
[name] [id number] Admission Data: 10/12/2014 3:13 PM Attending Provider: Leatha Gilding, MD  WUJ:WJXB,JYNWGNF L, MD Consults/ Treatment Team:    [name] is a 59 y.o. female patient is a direct admit awake, alert  & orientated  X 3,  Prior, VSS - Weight 62.732 kg (138 lb 4.8 oz)., O2    3 L nasal cannular, no c/o shortness of breath, no c/o chest pain, no distress noted.    Allergies:   Allergies  Allergen Reactions  . Cephalexin     Prefers not to have ? Psoriasis skin condition     Past Medical History  Diagnosis Date  . Turner's syndrome   . Aortic insufficiency     a. Mod AI by echo 2013.  Marland Kitchen Hypothyroidism   . Periorbital edema   . Hypertension   . Cervix abnormality 1993    MILD ATYPICAL ADENOMATOUS HYPERPLASIA OF CERVIX  . Dermatitis 05-26-12    all over  since 9'13  . Edema of both legs     a. right greater than left, due to Turner's syndrome.  Marland Kitchen Speech disorder     occ. "stutter"  . Difficult intubation     will plan on spinal anesthesia"small mouth/airway"  . Abdominal aortic stenosis     a. Mild AS bye cho 2013.  Marland Kitchen Acute systolic congestive heart failure 08/29/2014    Pt orientation to unit, room and routine. Information packet given to patient/family and safety video watched.  Admission INP armband ID verified with patient/family, and in place. SR up x 2, fall risk assessment complete with Patient and family verbalizing understanding of risks associated with falls. Pt verbalizes an understanding of how to use the call bell and to call for help before getting out of bed.  Skin, clean-dry- intact without evidence of bruising, or skin tears.   No evidence of skin break down noted on exam.     Will cont to monitor and assist as needed.  Kern Reap, RN 10/12/2014 3:13 PM

## 2014-10-13 ENCOUNTER — Encounter (HOSPITAL_COMMUNITY): Payer: Self-pay | Admitting: *Deleted

## 2014-10-13 ENCOUNTER — Inpatient Hospital Stay (HOSPITAL_COMMUNITY): Payer: Medicaid Other

## 2014-10-13 DIAGNOSIS — G4733 Obstructive sleep apnea (adult) (pediatric): Secondary | ICD-10-CM

## 2014-10-13 DIAGNOSIS — I480 Paroxysmal atrial fibrillation: Secondary | ICD-10-CM

## 2014-10-13 DIAGNOSIS — J392 Other diseases of pharynx: Secondary | ICD-10-CM

## 2014-10-13 DIAGNOSIS — Q969 Turner's syndrome, unspecified: Secondary | ICD-10-CM

## 2014-10-13 DIAGNOSIS — J9621 Acute and chronic respiratory failure with hypoxia: Secondary | ICD-10-CM

## 2014-10-13 DIAGNOSIS — J9622 Acute and chronic respiratory failure with hypercapnia: Secondary | ICD-10-CM

## 2014-10-13 DIAGNOSIS — E871 Hypo-osmolality and hyponatremia: Principal | ICD-10-CM

## 2014-10-13 DIAGNOSIS — N390 Urinary tract infection, site not specified: Secondary | ICD-10-CM | POA: Diagnosis present

## 2014-10-13 LAB — BASIC METABOLIC PANEL
Anion gap: 9 (ref 5–15)
BUN: 19 mg/dL (ref 6–20)
CHLORIDE: 89 mmol/L — AB (ref 101–111)
CO2: 29 mmol/L (ref 22–32)
Calcium: 8.6 mg/dL — ABNORMAL LOW (ref 8.9–10.3)
Creatinine, Ser: 0.67 mg/dL (ref 0.44–1.00)
GFR calc Af Amer: >60 mL/min (ref >60)
GFR calc non Af Amer: >60 mL/min (ref >60)
Glucose, Bld: 90 mg/dL (ref 65–99)
POTASSIUM: 4.4 mmol/L (ref 3.5–5.1)
Sodium: 127 mmol/L — ABNORMAL LOW (ref 135–145)

## 2014-10-13 LAB — CBC
HEMATOCRIT: 31.9 % — AB (ref 36.0–46.0)
Hemoglobin: 11.5 g/dL — ABNORMAL LOW (ref 12.0–15.0)
MCH: 33 pg (ref 26.0–34.0)
MCHC: 36.1 g/dL — ABNORMAL HIGH (ref 30.0–36.0)
MCV: 91.4 fL (ref 78.0–100.0)
Platelets: 310 10*3/uL (ref 150–400)
RBC: 3.49 MIL/uL — ABNORMAL LOW (ref 3.87–5.11)
RDW: 13 % (ref 11.5–15.5)
WBC: 10.3 10*3/uL (ref 4.0–10.5)

## 2014-10-13 MED ORDER — CIPROFLOXACIN HCL 500 MG PO TABS
500.0000 mg | ORAL_TABLET | Freq: Two times a day (BID) | ORAL | Status: DC
  Administered 2014-10-13 – 2014-10-16 (×7): 500 mg via ORAL
  Filled 2014-10-13 (×7): qty 1

## 2014-10-13 MED ORDER — IOHEXOL 350 MG/ML SOLN: 150.0000 mL | Freq: Once | INTRAVENOUS | Status: DC | PRN

## 2014-10-13 MED ORDER — SODIUM CHLORIDE 0.9 % IV SOLN
INTRAVENOUS | Status: AC
  Administered 2014-10-13: 08:00:00 via INTRAVENOUS

## 2014-10-13 NOTE — Progress Notes (Addendum)
PROGRESS NOTE  [name] ZOX:[id number] DOB: 02/03/1955 DOA: 10/12/2014 PCP: [name], MD  HPI/Recap of past 24 hours:  Returned from esophogram, laying in bed, denies pain, no n/v, no gagging, mother in room  Assessment/Plan: Principal Problem:   Hyponatremia Active Problems:   Osteoarthritis   Hypertension   OSA (obstructive sleep apnea)   Pulmonary nodule   LAD (lymphadenopathy), mediastinal   H/O total hip arthroplasty   Adult hypothyroidism   Turner's syndrome   Atrial fibrillation (HCC)   Chronic systolic congestive heart failure (HCC)   Gagging episode   UTI (urinary tract infection)  Hyponatremia/Hypotension in the setting of chronic hypertension. Likely due to dehydration. s/p gentle IVF. Hold diltiazem for now. Place hold parameters on metoprolol. Hold diuretics improving  Regurgitation of saliva / gagging  esophagram + smooth narrowing at GE junction, moderate size hiatal hernia. swallow eval pending   Aspiration precautions/elevate head of bed Npo after midnight, GI consult.  Atrial Fibrillation  Currently rate controlled. On Eliquis. Will continue. Need to place hold parameters on metoprolol and temporarily hold diltiazem due to low BP.  Hypothyroidism Recently had synthroid dose adjusted down. TSH one month ago was 0.370. Continue current synthroid dose.  Severe OSA Continue BIPAP QHS. Settings are in Dr. Thurston Hole recent office note.  Chronic Hypoxia Continue oxygen via N/C at 3L.  Turners Syndrome Presumably stable.   UTI: urine culture pending, on cipro.  Chronic respiratory failure with hypoxia and hypercapnia, on home oxygen and bipap at night.  Code Status: full  Family Communication: patient and mother  Disposition Plan: remain in the hospita   Consultants:  GI  Procedures:  Esophogram on 10/8  Antibiotics:  cipro   Objective: BP 105/54 mmHg  Pulse 73  Temp(Src) 98.5 F (36.9 C) (Oral)  Resp 18   Ht  (1.422 m)  Wt 140 lb 8 oz (63.73 kg)  BMI 31.52 kg/m2  SpO2 100%  Intake/Output Summary (Last 24 hours) at 10/13/14 1939 Last data filed at 10/13/14 1838  Gross per 24 hour  Intake    600 ml  Output   1575 ml  Net   -975 ml   Filed Weights   10/12/14 1300 10/13/14 0500  Weight: 138 lb 4.8 oz (62.732 kg) 140 lb 8 oz (63.73 kg)    Exam:   General:  NAD  Cardiovascular: RRR  Respiratory: CTABL  Abdomen: Soft/ND/NT, positive BS  Musculoskeletal: chronic right lower extremity mild edema  Neuro: at baseline, some dysarthria,   Data Reviewed: Basic Metabolic Panel:  Recent Labs Lab 10/12/14 1519 10/12/14 2046 10/13/14 0457  NA 122* 121* 127*  K 4.4 3.9 4.4  CL 84* 83* 89*  CO2 GLUCOSE 91 170* 90  BUN 23* 22* 19  CREATININE 0.85 0.93 0.67  CALCIUM 9.4 8.6* 8.6*   Liver Function Tests:  Recent Labs Lab 10/12/14 1519  AST 22  ALT 14  ALKPHOS 96  BILITOT 1.4*  PROT 6.6  ALBUMIN 3.6   No results for input(s): LIPASE, AMYLASE in the last 168 hours. No results for input(s): AMMONIA in the last 168 hours. CBC:  Recent Labs Lab 10/12/14 1519 10/13/14 0457  WBC 13.5* 10.3  NEUTROABS 9.7*  --   HGB 14.0 11.5*  HCT 38.0 31.9*  MCV 90.0 91.4  PLT 412* 310   Cardiac Enzymes:   No results for input(s): CKTOTAL, CKMB, CKMBINDEX, TROPONINI in the last 168 hours. BNP (last 3 results) No results for  input(s): BNP in the last 8760 hours.  ProBNP (last 3 results) No results for input(s): PROBNP in the last 8760 hours.  CBG: No results for input(s): GLUCAP in the last 168 hours.  Recent Results (from the past 240 hour(s))  Urine culture     Status: None (Preliminary result)   Collection Time: 10/12/14  5:58 PM  Result Value Ref Range Status   Specimen Description URINE, CLEAN CATCH  Final   Special Requests NONE  Final   Culture >=100,000 COLONIES/mL GRAM NEGATIVE RODS  Final   Report Status PENDING  Incomplete     Studies: Dg  Chest 2 View  10/13/2014   CLINICAL DATA:  Two episodes of gagging.  EXAM: ESOPHOGRAM/BARIUM SWALLOW  TECHNIQUE: Single contrast examination was performed using thin barium suspension.  FLUOROSCOPY TIME:  Fluoroscopy Time:  1 minutes and 35 seconds.  COMPARISON:  CT of the chest dated 08/29/2014  FINDINGS: Initial chest radiograph demonstrates mildly enlarged mediastinal silhouette. There is cardiomegaly. There are small bilateral pleural effusions and mild increased interstitial markings.  Please note that the esophagram is limited due to patient's inability to safely stand. Therefore, single contrast study with thin barium suspension was performed.  Upon opacification, the esophagus demonstrates torturous contour, without evidence of filling defects. There is a moderate hiatal hernia. The gastroesophageal junction demonstrates smooth narrowing, as the 13 mm barium tablet was not able to pass into the stomach.  Mild stasis of contrast is noted in bilateral piriform sinuses and valleculae.  Post procedural two-view radiograph of the chest demonstrates similar findings of cardiomegaly and small bilateral pleural effusions. The barium tablet is no longer visualized. There is no evidence of aspiration.  IMPRESSION: Torturous esophagus with a moderate in size hiatal hernia.  Smooth narrowing at the gastroesophageal junction, with inability of the 13 mm barium tablet to pass.  Mild stasis in bilateral piriform sinuses and valleculae, without evidence of frank aspiration.  Correlation with upper endoscopy is recommended.  Cardiomegaly and small bilateral pleural effusions.   Electronically Signed   By: Ted Mcalpine M.D.   On: 10/13/2014 16:13   Dg Esophagus  10/13/2014   CLINICAL DATA:  Two episodes of gagging.  EXAM: ESOPHOGRAM/BARIUM SWALLOW  TECHNIQUE: Single contrast examination was performed using thin barium suspension.  FLUOROSCOPY TIME:  Fluoroscopy Time:  1 minutes and 35 seconds.  COMPARISON:  CT of  the chest dated 08/29/2014  FINDINGS: Initial chest radiograph demonstrates mildly enlarged mediastinal silhouette. There is cardiomegaly. There are small bilateral pleural effusions and mild increased interstitial markings.  Please note that the esophagram is limited due to patient's inability to safely stand. Therefore, single contrast study with thin barium suspension was performed.  Upon opacification, the esophagus demonstrates torturous contour, without evidence of filling defects. There is a moderate hiatal hernia. The gastroesophageal junction demonstrates smooth narrowing, as the 13 mm barium tablet was not able to pass into the stomach.  Mild stasis of contrast is noted in bilateral piriform sinuses and valleculae.  Post procedural two-view radiograph of the chest demonstrates similar findings of cardiomegaly and small bilateral pleural effusions. The barium tablet is no longer visualized. There is no evidence of aspiration.  IMPRESSION: Torturous esophagus with a moderate in size hiatal hernia.  Smooth narrowing at the gastroesophageal junction, with inability of the 13 mm barium tablet to pass.  Mild stasis in bilateral piriform sinuses and valleculae, without evidence of frank aspiration.  Correlation with upper endoscopy is recommended.  Cardiomegaly and small bilateral pleural  effusions.   Electronically Signed   By: Ted Mcalpine M.D.   On: 10/13/2014 16:13    Scheduled Meds: . apixaban  5 mg Oral BID  . cholecalciferol  2,000 Units Oral Daily  . ciprofloxacin  500 mg Oral BID  . fluticasone  2 spray Each Nare Daily  . guaiFENesin  1,200 mg Oral BID  . hydrocerin   Topical BID  . levothyroxine  100 mcg Oral QAC breakfast  . metoprolol tartrate  12.5 mg Oral BID  . sodium chloride  3 mL Intravenous Q12H    Continuous Infusions:    Time spent:  Marlet Korte MD, PhD  [facility] Pager [phone]. If 7PM-7AM, please contact night-coverage at [email or web address], password  [facility] 10/13/2014, 7:39 PM  LOS: 1 day

## 2014-10-13 NOTE — Evaluation (Addendum)
SLP Cancellation Note  Patient Details Name: [name] MRN: [id number] DOB: [birth year]   Cancelled treatment:       Reason Eval/Treat Not Completed: Patient at procedure or test/unavailable (pt being bathed by hospital staff, will continue efforts)  Note order for pt to have an esophagram, may be beneficial for SLP to wait until after esophagram for eval.    Donavan Burnet, MS [facility] SLP [phone]

## 2014-10-13 NOTE — Progress Notes (Signed)
U/A appears infected.  Oral Cipro started.  Culture pending.  Algis Downs, PA-C [facility] Pager: [phone]

## 2014-10-14 DIAGNOSIS — K449 Diaphragmatic hernia without obstruction or gangrene: Secondary | ICD-10-CM

## 2014-10-14 DIAGNOSIS — I10 Essential (primary) hypertension: Secondary | ICD-10-CM

## 2014-10-14 LAB — CBC
HEMATOCRIT: 37.8 % (ref 36.0–46.0)
Hemoglobin: 13.4 g/dL (ref 12.0–15.0)
MCH: 33.1 pg (ref 26.0–34.0)
MCHC: 35.4 g/dL (ref 30.0–36.0)
MCV: 93.3 fL (ref 78.0–100.0)
Platelets: 313 10*3/uL (ref 150–400)
RBC: 4.05 MIL/uL (ref 3.87–5.11)
RDW: 13 % (ref 11.5–15.5)
WBC: 9.5 10*3/uL (ref 4.0–10.5)

## 2014-10-14 LAB — COMPREHENSIVE METABOLIC PANEL
ALBUMIN: 3.1 g/dL — AB (ref 3.5–5.0)
ALT: 13 U/L — ABNORMAL LOW (ref 14–54)
ANION GAP: 9 (ref 5–15)
AST: 21 U/L (ref 15–41)
Alkaline Phosphatase: 87 U/L (ref 38–126)
BILIRUBIN TOTAL: 0.5 mg/dL (ref 0.3–1.2)
BUN: 14 mg/dL (ref 6–20)
CO2: 28 mmol/L (ref 22–32)
Calcium: 8.9 mg/dL (ref 8.9–10.3)
Chloride: 93 mmol/L — ABNORMAL LOW (ref 101–111)
Creatinine, Ser: 0.7 mg/dL (ref 0.44–1.00)
GFR calc Af Amer: >60 mL/min (ref >60)
GFR calc non Af Amer: >60 mL/min (ref >60)
GLUCOSE: 95 mg/dL (ref 65–99)
POTASSIUM: 4.4 mmol/L (ref 3.5–5.1)
SODIUM: 130 mmol/L — AB (ref 135–145)
TOTAL PROTEIN: 6 g/dL — AB (ref 6.5–8.1)

## 2014-10-14 NOTE — Progress Notes (Signed)
Patient placed on CPAP auto and . 3L O2 bleed in. RT will continue to monitor as needed.

## 2014-10-14 NOTE — Progress Notes (Signed)
Pt does not want to wear CPAP/Bipap tonight. Pt and family stated she tried to wear last night and had to be placed on N/C because she did not tolerate. Pt states home mask is more comfortable to wear than hospital machine. Pt and family encouraged to call if pt changes mind. No distress noted at this time.

## 2014-10-14 NOTE — Progress Notes (Signed)
PROGRESS NOTE  [name] ZOX:[id number] DOB: [birth year] DOA: 10/12/2014 PCP: [name], MD  HPI/Recap of past 24 hours:   laying in bed, denies pain, no n/v, no gagging, mother in room  Assessment/Plan: Principal Problem:   Hyponatremia Active Problems:   Osteoarthritis   Hypertension   OSA (obstructive sleep apnea)   Pulmonary nodule   LAD (lymphadenopathy), mediastinal   H/O total hip arthroplasty   Adult hypothyroidism   Turner's syndrome   Atrial fibrillation (HCC)   Chronic systolic congestive heart failure (HCC)   Gagging episode   UTI (urinary tract infection)  Hyponatremia/Hypotension in the setting of chronic hypertension. Likely due to dehydration. s/p gentle IVF. Hold diltiazem for now. Place hold parameters on metoprolol. Hold diuretics Improving, na [phone]  Regurgitation of saliva / gagging  esophagram + smooth narrowing at GE junction, moderate size hiatal hernia.   Aspiration precautions/elevate head of bed GI input appreciated, hold apixaban, EGD early next week. Currently on soft diet as tolerated.  Atrial Fibrillation  NSR since admission, on home dose lopressor, cardizem held since admission due to low bp on admission.  Likely able to restart cardizem when bp more stable.  Hypothyroidism Recently had synthroid dose adjusted down. TSH one month ago was 0.370. Continue current synthroid dose.  Severe OSA Continue BIPAP QHS. Settings are in Dr. Thurston Hole recent office note.  Chronic Hypoxia Continue oxygen via N/C at 3L.  Turners Syndrome Presumably stable.   UTI: urine culture g-rods, continue cipro.  Chronic respiratory failure with hypoxia and hypercapnia, on home oxygen and bipap at night.  Code Status: full  Family Communication: patient and mother  Disposition Plan: remain in the hospital   Consultants:  GI  Procedures:  Esophogram 10/8  Planned EGD early next  week  Antibiotics:  cipro   Objective: BP 132/60 mmHg  Pulse 73  Temp(Src) 97.6 F (36.4 C) (Oral)  Resp 20  Ht  (1.422 m)  Wt 140 lb 8 oz (63.73 kg)  BMI 31.52 kg/m2  SpO2 100%  Intake/Output Summary (Last 24 hours) at 10/14/14 1708 Last data filed at 10/14/14 0903  Gross per 24 hour  Intake    243 ml  Output   1050 ml  Net   -807 ml   Filed Weights   10/12/14 1300 10/13/14 0500  Weight: 138 lb 4.8 oz (62.732 kg) 140 lb 8 oz (63.73 kg)    Exam:   General:  NAD  Cardiovascular: RRR  Respiratory: CTABL  Abdomen: Soft/ND/NT, positive BS  Musculoskeletal: chronic right lower extremity mild edema  Neuro: at baseline, some dysarthria,   Data Reviewed: Basic Metabolic Panel:  Recent Labs Lab 10/12/14 1519 10/12/14 2046 10/13/14 0457 10/14/14 0558  NA 122* 121* 127* 130*  K 4.4 3.9 4.4 4.4  CL 84* 83* 89* 93*  CO2 GLUCOSE 91 170* 90 95  BUN 23* 22* 19 14  CREATININE 0.85 0.93 0.67 0.70  CALCIUM 9.4 8.6* 8.6* 8.9   Liver Function Tests:  Recent Labs Lab 10/12/14 1519 10/14/14 0558  AST 22 21  ALT 14 13*  ALKPHOS 96 87  BILITOT 1.4* 0.5  PROT 6.6 6.0*  ALBUMIN 3.6 3.1*   No results for input(s): LIPASE, AMYLASE in the last 168 hours. No results for input(s): AMMONIA in the last 168 hours. CBC:  Recent Labs Lab 10/12/14 1519 10/13/14 0457 10/14/14 0558  WBC 13.5* 10.3 9.5  NEUTROABS 9.7*  --   --  HGB 14.0 11.5* 13.4  HCT 38.0 31.9* 37.8  MCV 90.0 91.4 93.3  PLT 412* 310 313   Cardiac Enzymes:   No results for input(s): CKTOTAL, CKMB, CKMBINDEX, TROPONINI in the last 168 hours. BNP (last 3 results) No results for input(s): BNP in the last 8760 hours.  ProBNP (last 3 results) No results for input(s): PROBNP in the last 8760 hours.  CBG: No results for input(s): GLUCAP in the last 168 hours.  Recent Results (from the past 240 hour(s))  Urine culture     Status: None (Preliminary result)   Collection Time:  10/12/14  5:58 PM  Result Value Ref Range Status   Specimen Description URINE, CLEAN CATCH  Final   Special Requests NONE  Final   Culture >=100,000 COLONIES/mL GRAM NEGATIVE RODS  Final   Report Status PENDING  Incomplete     Studies: No results found.  Scheduled Meds: . cholecalciferol  2,000 Units Oral Daily  . ciprofloxacin  500 mg Oral BID  . fluticasone  2 spray Each Nare Daily  . guaiFENesin  1,200 mg Oral BID  . hydrocerin   Topical BID  . levothyroxine  100 mcg Oral QAC breakfast  . metoprolol tartrate  12.5 mg Oral BID  . sodium chloride  3 mL Intravenous Q12H    Continuous Infusions:    Time spent:  Josaphine Shimamoto MD, PhD  [facility] Pager [phone]. If 7PM-7AM, please contact night-coverage at [email or web address], password [facility] 10/14/2014, 5:08 PM  LOS: 2 days

## 2014-10-14 NOTE — Consult Note (Signed)
Subjective:   HPI  The patient is a 59 year old female with a history of Turner's syndrome, moderate aortic insufficiency and hypothyroidism. We are asked to see her in regards to an abnormal barium swallow which shows a tortuous esophagus, moderate size hiatal hernia, and a smooth tapering of the distal esophagus at the level of the esophagogastric junction. A 13 mm barium tablet was not able to pass through this area. Endoscopic evaluation was recommended. The patient does complain of spitting and gagging. I could not get her to admit to food sticking in the esophagus. Denies hematemesis or heartburn.  The patient has been taking Eliquis.     Past Medical History  Diagnosis Date  . Turner's syndrome   . Aortic insufficiency     a. Mod AI by echo 2013.  Marland Kitchen Hypothyroidism   . Periorbital edema   . Hypertension   . Cervix abnormality 1993    MILD ATYPICAL ADENOMATOUS HYPERPLASIA OF CERVIX  . Dermatitis 05-26-12    all over  since 9'13  . Edema of both legs     a. right greater than left, due to Turner's syndrome.  Marland Kitchen Speech disorder     occ. "stutter"  . Difficult intubation     will plan on spinal anesthesia"small mouth/airway"  . Abdominal aortic stenosis     a. Mild AS bye cho 2013.  Marland Kitchen Acute systolic congestive heart failure (HCC) 08/29/2014   Past Surgical History  Procedure Laterality Date  . Total hip arthroplasty      LEFT  . US echocardiography  06/20/2009    EF 55-60%  . Dilation and curettage of uterus  1993    CONE BIOPSY  . Cervical cone biopsy  1993  . Hernia repair    . Colposcopy    . Joint replacement      hip replacement  . Total knee arthroplasty Right 05/31/2012    Procedure: RIGHT TOTAL KNEE ARTHROPLASTY;  Surgeon: Shelda Pal, MD;  Location: [facility];  Service: Orthopedics;  Laterality: Right;   Social History   Social History  . Marital Status: Single    Spouse Name: N/A  . Number of Children: N/A  . Years of Education: N/A   Occupational  History  . N/A    Social History Main Topics  . Smoking status: Never Smoker   . Smokeless tobacco: Never Used  . Alcohol Use: No  . Drug Use: No  . Sexual Activity: No   Other Topics Concern  . Not on file   Social History Narrative   family history includes Atrial fibrillation in her mother; Breast cancer in her cousin; Cancer in her mother; Diabetes in her maternal uncle; Hypertension in her mother.  Current facility-administered medications:  .  acetaminophen (TYLENOL) tablet 650 mg, 650 mg, Oral, Q6H PRN **OR** acetaminophen (TYLENOL) suppository 650 mg, 650 mg, Rectal, Q6H PRN, Tora Kindred York, PA-C .  alum & mag hydroxide-simeth (MAALOX/MYLANTA) 200-200-20 MG/5ML suspension 30 mL, 30 mL, Oral, Q6H PRN, Tora Kindred York, PA-C .  bisacodyl (DULCOLAX) suppository 10 mg, 10 mg, Rectal, Daily PRN, Stephani Police, PA-C .  cholecalciferol (VITAMIN D) tablet 2,000 Units, 2,000 Units, Oral, Daily, Stephani Police, PA-C, 2,000 Units at 10/14/14 1002 .  ciprofloxacin (CIPRO) tablet 500 mg, 500 mg, Oral, BID, Marianne L York, PA-C, 500 mg at 10/14/14 1002 .  fluticasone (FLONASE) 50 MCG/ACT nasal spray 2 spray, 2 spray, Each Nare, Daily, Tora Kindred York, PA-C, 2 spray at 10/14/14 1003 .  guaiFENesin (MUCINEX) 12 hr tablet 1,200 mg, 1,200 mg, Oral, BID, Marianne L York, PA-C, 1,200 mg at 10/14/14 1002 .  hydrocerin (EUCERIN) cream, , Topical, BID, Stephani Police, PA-C, 1 application at 10/14/14 1000 .  iohexol (OMNIPAQUE) 350 MG/ML injection 150 mL, 150 mL, Oral, Once PRN, Loreli Slot, MD .  levothyroxine (SYNTHROID, LEVOTHROID) tablet 100 mcg, 100 mcg, Oral, QAC breakfast, Bertram Millard, RPH, 100 mcg at 10/14/14 1002 .  metoprolol tartrate (LOPRESSOR) tablet 12.5 mg, 12.5 mg, Oral, BID, Marianne L York, PA-C, 12.5 mg at 10/14/14 1002 .  ondansetron (ZOFRAN) tablet 4 mg, 4 mg, Oral, Q6H PRN **OR** ondansetron (ZOFRAN) injection 4 mg, 4 mg, Intravenous, Q6H PRN, Tora Kindred York,  PA-C .  senna-docusate (Senokot-S) tablet 1 tablet, 1 tablet, Oral, QHS PRN, Tora Kindred York, PA-C .  sodium chloride (OCEAN) 0.65 % nasal spray 1 spray, 1 spray, Each Nare, PRN, Tora Kindred York, PA-C .  sodium chloride 0.9 % injection 3 mL, 3 mL, Intravenous, Q12H, Marianne L York, PA-C, 3 mL at 10/14/14 1003 .  traMADol (ULTRAM) tablet 50 mg, 50 mg, Oral, Q6H PRN, Stephani Police, PA-C Allergies  Allergen Reactions  . Cephalexin     Prefers not to have ? Psoriasis skin condition     Objective:     BP 144/59 mmHg  Pulse 67  Temp(Src) 97.7 F (36.5 C) (Oral)  Resp 18  Ht  (1.422 m)  Wt 63.73 kg (140 lb 8 oz)  BMI 31.52 kg/m2  SpO2 100%  She is in no distress  Heart regular rhythm  Lungs clear  Abdomen: Bowel sounds present, soft, nontender  Laboratory No components found for: D1    Assessment:     Abnormal barium swallow. Rule out distal esophageal stricture      Plan:     We will plan for an EGD with the possibility of an esophageal dilation in a couple of days. Her mother was not present today when I saw her. The patient would like for [place] to speak with her mother and her together to further discuss this. I have asked the primary care team to hold the Eliquis, as this needs to be held prior to endoscopic intervention.

## 2014-10-14 NOTE — Evaluation (Addendum)
SLP Cancellation Note  Patient Details Name: [name] MRN: [id number] DOB: 06-13-1955   Cancelled treatment:       Reason Eval/Treat Not Completed: Other (comment) (per Dr Warren Danes note from 10/8 pm, pt to be NPO after midnight and GI consult indicated due to results of esophagram)  SLP was paged by Florentina Addison, pt's nurse, and SLP reviewed finding of esophagram and MD note indicating plan of care with RN.    Donavan Burnet, MS [facility] SLP [phone]

## 2014-10-15 ENCOUNTER — Telehealth: Payer: Self-pay | Admitting: Cardiology

## 2014-10-15 DIAGNOSIS — I5022 Chronic systolic (congestive) heart failure: Secondary | ICD-10-CM

## 2014-10-15 MED ORDER — PANTOPRAZOLE SODIUM 40 MG PO TBEC
40.0000 mg | DELAYED_RELEASE_TABLET | Freq: Every day | ORAL | Status: DC
Start: 2014-10-15 — End: 2014-10-16
  Administered 2014-10-15 – 2014-10-16 (×2): 40 mg via ORAL
  Filled 2014-10-15 (×3): qty 1

## 2014-10-15 NOTE — Progress Notes (Signed)
Utilization review completed. Pinkey Mcjunkin, RN, BSN. 

## 2014-10-15 NOTE — Telephone Encounter (Signed)
Mother just wanted to update on daughter Patient still in hospital and having Endoscopy tomorrow

## 2014-10-15 NOTE — Progress Notes (Signed)
PROGRESS NOTE  [name] JXB:[id number] DOB: Jul 25, 1955 DOA: 10/12/2014 PCP: [name], MD  HPI/Recap of past 24 hours:   laying in bed, denies pain, no n/v, no gagging, tolerating soft diet, mother in room  Assessment/Plan: Principal Problem:   Hyponatremia Active Problems:   Osteoarthritis   Hypertension   OSA (obstructive sleep apnea)   Pulmonary nodule   LAD (lymphadenopathy), mediastinal   H/O total hip arthroplasty   Adult hypothyroidism   Turner's syndrome   Atrial fibrillation (HCC)   Chronic systolic congestive heart failure (HCC)   Gagging episode   UTI (urinary tract infection)  Hyponatremia/Hypotension in the setting of chronic hypertension. Likely due to dehydration. s/p gentle IVF. Hold diltiazem for now. Place hold parameters on metoprolol. Hold diuretics Improving, na [phone] Repeat bmp in am, continue hold diuretics for now, may consider resume lasix and spironolactone at lower dose at discharge.  Regurgitation of saliva / gagging  esophagram + smooth narrowing at GE junction, moderate size hiatal hernia.   Aspiration precautions/elevate head of bed GI input appreciated, hold apixaban, EGD to be determined by GI. Currently on soft diet as tolerated.  Atrial Fibrillation, paroxysmal ( new diagnosis last hospitalization in 08/2014) NSR since admission, on home dose lopressor, cardizem held since admission due to low bp on admission.  Per last cardiology note, they plan to taper off cardizem and titrate betablocker, will d/c cardizem. Patient has appointment with cardiology dr Charlestine Massed this Friday. apixaban held sinc 10/9 due to possible egd.  Hypothyroidism,  Recently had synthroid dose adjusted down. TSH one month ago was 0.370. Continue current synthroid dose.  Severe OSA Continue BIPAP QHS. Settings are in Dr. Thurston Hole recent office note.   Turners Syndrome Presumably stable.   UTI: urine culture g-rods, continue  cipro.  Chronic respiratory failure with hypoxia and hypercapnia, on home oxygen 3L, bipap at night.  Code Status: full  Family Communication: patient and mother  Disposition Plan: remain in the hospital   Consultants:  GI  Procedures:  Esophogram 10/8  EGD? to be determined by GI  Antibiotics:  cipro   Objective: BP 140/57 mmHg  Pulse 73  Temp(Src) 98.2 F (36.8 C) (Oral)  Resp 18  Ht  (1.422 m)  Wt 140 lb 8 oz (63.73 kg)  BMI 31.52 kg/m2  SpO2 100%  Intake/Output Summary (Last 24 hours) at 10/15/14 1143 Last data filed at 10/15/14 1101  Gross per 24 hour  Intake    636 ml  Output    200 ml  Net    436 ml   Filed Weights   10/12/14 1300 10/13/14 0500  Weight: 138 lb 4.8 oz (62.732 kg) 140 lb 8 oz (63.73 kg)    Exam:   General:  NAD  Cardiovascular: RRR  Respiratory: CTABL  Abdomen: Soft/ND/NT, positive BS  Musculoskeletal: chronic right lower extremity mild edema  Neuro: at baseline, some dysarthria,   Data Reviewed: Basic Metabolic Panel:  Recent Labs Lab 10/12/14 1519 10/12/14 2046 10/13/14 0457 10/14/14 0558  NA 122* 121* 127* 130*  K 4.4 3.9 4.4 4.4  CL 84* 83* 89* 93*  CO2 GLUCOSE 91 170* 90 95  BUN 23* 22* 19 14  CREATININE 0.85 0.93 0.67 0.70  CALCIUM 9.4 8.6* 8.6* 8.9   Liver Function Tests:  Recent Labs Lab 10/12/14 1519 10/14/14 0558  AST 22 21  ALT 14 13*  ALKPHOS 96 87  BILITOT 1.4* 0.5  PROT 6.6 6.0*  ALBUMIN 3.6 3.1*   No results for input(s): LIPASE, AMYLASE in the last 168 hours. No results for input(s): AMMONIA in the last 168 hours. CBC:  Recent Labs Lab 10/12/14 1519 10/13/14 0457 10/14/14 0558  WBC 13.5* 10.3 9.5  NEUTROABS 9.7*  --   --   HGB 14.0 11.5* 13.4  HCT 38.0 31.9* 37.8  MCV 90.0 91.4 93.3  PLT 412* 310 313   Cardiac Enzymes:   No results for input(s): CKTOTAL, CKMB, CKMBINDEX, TROPONINI in the last 168 hours. BNP (last 3 results) No results for input(s):  BNP in the last 8760 hours.  ProBNP (last 3 results) No results for input(s): PROBNP in the last 8760 hours.  CBG: No results for input(s): GLUCAP in the last 168 hours.  Recent Results (from the past 240 hour(s))  Urine culture     Status: None (Preliminary result)   Collection Time: 10/12/14  5:58 PM  Result Value Ref Range Status   Specimen Description URINE, CLEAN CATCH  Final   Special Requests NONE  Final   Culture >=100,000 COLONIES/mL GRAM NEGATIVE RODS  Final   Report Status PENDING  Incomplete     Studies: No results found.  Scheduled Meds: . cholecalciferol  2,000 Units Oral Daily  . ciprofloxacin  500 mg Oral BID  . fluticasone  2 spray Each Nare Daily  . guaiFENesin  1,200 mg Oral BID  . hydrocerin   Topical BID  . levothyroxine  100 mcg Oral QAC breakfast  . metoprolol tartrate  12.5 mg Oral BID  . pantoprazole  40 mg Oral Daily  . sodium chloride  3 mL Intravenous Q12H    Continuous Infusions:    Time spent:  Donna Benegas MD, PhD  [facility] Pager [phone]. If 7PM-7AM, please contact night-coverage at [email or web address], password [facility] 10/15/2014, 11:43 AM  LOS: 3 days

## 2014-10-15 NOTE — Care Management Note (Addendum)
Case Management Note  Patient Details  Name: [name] MRN: [id number] Date of Birth: 1955-06-20  Subjective/Objective:                  Date-10-15-14 Monday Initial Assessment Spoke with patient at the bedside along with Ivor Costa [phone].  Introduced self as Sports coach and explained role in discharge planning and how to be reached.  Verified patient lives at home in [place] [facility] with mother.  Verified patient anticipates to go home with family at time of discharge and will have full- time supervision by mother at this time to best of their knowledge.  Patient has DME walker, BPAP and Oxygen through [facility]. Expressed potential need for no other DME.  Patient denied needing help with their medication.  Patient is driven by mother or friend to MD appointments.  Verified patient has PCP Cammille Mardelle Matte. Patient states they currently receive HH services through no one.  Patient was provided choice and selected [facility]  For home health services if needed after DC.  Plan: CM will continue to follow for discharge planning and [facility] resources.   Lawerance Sabal RN BSN CM [phone]   Action/Plan:  10-16-14 Discharge to home in care of mother, No CM needs identified.   Expected Discharge Date:                  Expected Discharge Plan:  Home/Self Care  In-House Referral:     Discharge planning Services  CM Consult  Post Acute Care Choice:    Choice offered to:     DME Arranged:    DME Agency:     HH Arranged:    HH Agency:     Status of Service:  In process, will continue to follow  Medicare Important Message Given:    Date Medicare IM Given:    Medicare IM give by:    Date Additional Medicare IM Given:    Additional Medicare Important Message give by:     If discussed at Long Length of Stay Meetings, dates discussed:    Additional Comments:  Lawerance Sabal, RN 10/15/2014, 3:04 PM

## 2014-10-15 NOTE — Progress Notes (Signed)
Dwyane Dee 9:44 AM  Subjective: Patient seen and examined and case discussed with her mother and discussed with my partner Dr. Evette Cristal and currently she is not having any swallowing problems or any further gagging  Objective: Vital signs stable afebrile no acute distress lungs are clear decreased heart sounds abdomen is soft nontender barium swallow reviewed  Assessment: Seemingly improved  Plan: I would watch her eat for another day or 2 and I discussed endoscopy with possible dilation with the patient and her mother and they are both comfortable holding off if she has no further gagging and it's hard to know if the gagging was oral pharyngeal or due  to her esophagus or some other problem and I'm surprised she's not on a pump inhibitor and will continue to follow with you and consider a speech therapy swallowing eval if questions about aspiration continue  [facility]  Pager [phone] After 5PM or if no answer call [phone]

## 2014-10-15 NOTE — Telephone Encounter (Signed)
New message ° ° ° ° °Talk to [name] °

## 2014-10-16 LAB — BASIC METABOLIC PANEL
ANION GAP: 8 (ref 5–15)
BUN: 12 mg/dL (ref 6–20)
CALCIUM: 9.2 mg/dL (ref 8.9–10.3)
CO2: 29 mmol/L (ref 22–32)
Chloride: 95 mmol/L — ABNORMAL LOW (ref 101–111)
Creatinine, Ser: 0.65 mg/dL (ref 0.44–1.00)
GLUCOSE: 112 mg/dL — AB (ref 65–99)
POTASSIUM: 5 mmol/L (ref 3.5–5.1)
SODIUM: 132 mmol/L — AB (ref 135–145)

## 2014-10-16 LAB — URINE CULTURE

## 2014-10-16 MED ORDER — CIPROFLOXACIN HCL 500 MG PO TABS: 500.0000 mg | ORAL_TABLET | Freq: Two times a day (BID) | ORAL | Status: DC

## 2014-10-16 MED ORDER — FUROSEMIDE 40 MG PO TABS: 40.0000 mg | ORAL_TABLET | Freq: Every day | ORAL | Status: DC

## 2014-10-16 MED ORDER — FAMOTIDINE 20 MG PO TABS: 20.0000 mg | ORAL_TABLET | Freq: Two times a day (BID) | ORAL | Status: DC

## 2014-10-16 MED ORDER — METOPROLOL TARTRATE 25 MG PO TABS: 25.0000 mg | ORAL_TABLET | Freq: Two times a day (BID) | ORAL | Status: DC

## 2014-10-16 NOTE — Discharge Summary (Signed)
Discharge Summary  [name] WUJ:[id number] DOB: Jul 25, 1955  PCP: [name], MD  Admit date: 10/12/2014 Discharge date: 10/16/2014  Time spent: <67mins  Recommendations for Outpatient Follow-up:  1. F/u with PMD on 10/25 for hospital discharge follow up. 2. F/u with Gastroenterologist Dr. Ewing Schlein in three weeks. 3. F/u with cardiology Dr. Patty Sermons on 10/14.   Discharge Diagnoses:  Active Hospital Problems   Diagnosis Date Noted  . Hyponatremia 10/12/2014  . UTI (urinary tract infection) 10/13/2014  . Atrial fibrillation (HCC) 10/12/2014  . Chronic systolic congestive heart failure (HCC) 10/12/2014  . Gagging episode   . Pulmonary nodule 08/30/2014  . LAD (lymphadenopathy), mediastinal 08/30/2014  . OSA (obstructive sleep apnea) 09/10/2012  . Turner's syndrome 06/04/2011  . H/O total hip arthroplasty 06/04/2011  . Adult hypothyroidism 06/04/2011  . Hypertension   . Osteoarthritis 10/16/2010    Resolved Hospital Problems   Diagnosis Date Noted Date Resolved  No resolved problems to display.    Discharge Condition: stable  Diet recommendation: heart healthy/soft diet  Filed Weights   10/12/14 1300 10/13/14 0500 10/16/14 0348  Weight: 138 lb 4.8 oz (62.732 kg) 140 lb 8 oz (63.73 kg) 137 lb 11.2 oz (62.46 kg)    History of present illness:  [name] is a 59 y.o. female, with turner's syndrome, mod aortic insufficiency, and hypothyroidism, who was admitted in August with new onset afib, systolic heart failure and aspiration pneumonia. She presents as a direct admit from home due to a sodium of 121 drawn at her PCP's office on 10/11/2014. History is gathered from both the patient and her mother as the patient is slightly difficult to understand. The patient does not report feeling poorly, but does complain of "gagging" up clear sputum several times a day. This started approximately 1 week ago. It sometimes occurs when she is eating and sometimes occurs just  at rest. She denies chest pain, abdominal pain, changes in bowel movements and dysuria. She has had one episode of vomiting when she ate a chicken salad sandwich for dinner last night. The patient denies hematemesis and hemoptysis. She has not had cough or fever. With regard to the low sodium - the patient and her mother have noticed no confusion, and they do not complain of seizure activity. The patient was started on lasix in August 2016 and was started on spironolactone on 9/8. Her thyroid medication dose was decreased in August 2016. She has no known history of hyponatremia.  The patient was a direct admit. CBC, CMET, TSH, Cortisol, U/A, Urine studies, CXR and EKG are pending.   Hospital Course:  Principal Problem:   Hyponatremia Active Problems:   Osteoarthritis   Hypertension   OSA (obstructive sleep apnea)   Pulmonary nodule   LAD (lymphadenopathy), mediastinal   H/O total hip arthroplasty   Adult hypothyroidism   Turner's syndrome   Atrial fibrillation (HCC)   Chronic systolic congestive heart failure (HCC)   Gagging episode   UTI (urinary tract infection)  Hyponatremia/Hypotension in the setting of chronic hypertension. Likely due to dehydration. s/p gentle IVF. Hold diltiazem held since admission and discontinued at discharge.  diuretics held since admission and resumed at lower dose at discharge. Improving, na [id number] Outpatient follow up with pmd and cardiology arranged.   Regurgitation of saliva / gagging esophagram + smooth narrowing at GE junction, moderate size hiatal hernia.  Aspiration precautions/elevate head of bed GI input appreciated, initially plan for EGD with holding apixaban, patient has been tolerating soft diet. GI  decided to not to proceed with EGD, apixaban resumed, patient is discharged on soft diet with outpatient follow up. Patient has swallow eval, recommended soft diet.  Atrial Fibrillation, paroxysmal ( new diagnosis last  hospitalization in 08/2014) NSR since admission, on home dose lopressor, cardizem held since admission due to low bp on admission.  Per last cardiology note, they plan to taper off cardizem and titrate betablocker, d/c cardizem.  Patient has appointment with cardiology dr Charlestine Massed this Friday 10/14. apixaban held sinc 10/9 due to possible egd, resumed at discharge.   Hypothyroidism,  Recently had synthroid dose adjusted down. TSH one month ago was 0.370. Continue current synthroid dose.  Severe OSA Continue BIPAP QHS. Settings are in Dr. Thurston Hole recent office note.   Turners Syndrome Presumably stable.   UTI: urine culture g-rods (klebsiella pneumo), sensitive to  cipro.  Chronic respiratory failure with hypoxia and hypercapnia, on home oxygen 3L, bipap at night.  Code Status: full  Family Communication: patient and mother  Disposition Plan: home on 10/11   Consultants:  [facility]  Procedures:  Esophogram 10/8   Antibiotics:  cipro  Discharge Exam: BP 156/58 mmHg  Pulse 72  Temp(Src) 97.9 F (36.6 C) (Oral)  Resp 16  Ht 4\' 8"  (1.422 m)  Wt 137 lb 11.2 oz (62.46 kg)  BMI 30.89 kg/m2  SpO2 100%   General: NAD  Cardiovascular: RRR  Respiratory: CTABL  Abdomen: Soft/ND/NT, positive BS  Musculoskeletal: chronic right lower extremity mild edema  Neuro: at baseline, some dysarthria,   Discharge Instructions You were cared for by a hospitalist during your hospital stay. If you have any questions about your discharge medications or the care you received while you were in the hospital after you are discharged, you can call the unit and asked to speak with the hospitalist on call if the hospitalist that took care of you is not available. Once you are discharged, your primary care physician will handle any further medical issues. Please note that NO REFILLS for any discharge medications will be authorized once you are discharged, as it is imperative that you  return to your primary care physician (or establish a relationship with a primary care physician if you do not have one) for your aftercare needs so that they can reassess your need for medications and monitor your lab values.  Discharge Instructions    Diet - low sodium heart healthy    Complete by:  As directed      Increase activity slowly    Complete by:  As directed             Medication List    STOP taking these medications        diltiazem 360 MG 24 hr capsule  Commonly known as:  CARDIZEM CD      TAKE these medications        apixaban 5 MG Tabs tablet  Commonly known as:  ELIQUIS  Take 1 tablet (5 mg total) by mouth 2 (two) times daily.     ciprofloxacin 500 MG tablet  Commonly known as:  CIPRO  Take 1 tablet (500 mg total) by mouth 2 (two) times daily.     famotidine 20 MG tablet  Commonly known as:  PEPCID  Take 1 tablet (20 mg total) by mouth 2 (two) times daily.     furosemide 40 MG tablet  Commonly known as:  LASIX  Take 1 tablet (40 mg total) by mouth daily.     KLOR-CON  M10 10 MEQ tablet  Generic drug:  potassium chloride  TAKE ONE TABLET BY MOUTH TWICE DAILY     levothyroxine 100 MCG tablet  Commonly known as:  SYNTHROID, LEVOTHROID  Take 100 mcg by mouth daily before breakfast.     metoprolol tartrate 25 MG tablet  Commonly known as:  LOPRESSOR  Take 1 tablet (25 mg total) by mouth 2 (two) times daily.     spironolactone 25 MG tablet  Commonly known as:  ALDACTONE  One twice daily     traMADol 50 MG tablet  Commonly known as:  ULTRAM  Take 50 mg by mouth every 6 (six) hours as needed for moderate pain.     Vitamin D3 2000 UNITS capsule  Take 2,000 Units by mouth daily.       Allergies  Allergen Reactions  . Cephalexin     Prefers not to have ? Psoriasis skin condition       Follow-up Information    Follow up with ANDY,CAMILLE L, MD In 2 weeks.   Specialty:  Family Medicine   Why:  hospital discharge follow up   Contact  information:   [address] [place] [address] [phone]       Follow up with [facility], MD In 3 weeks.   Specialty:  Gastroenterology   Why:  esophageal narrowing   Contact information:   [address] [place] [address] [phone]       Follow up with Ronny Flurry, MD On 10/19/2014.   Specialty:  Cardiology   Contact information:   [address] [place] [address] [phone]        The results of significant diagnostics from this hospitalization (including imaging, microbiology, ancillary and laboratory) are listed below for reference.    Significant Diagnostic Studies: Dg Chest 2 View  10/13/2014   CLINICAL DATA:  Two episodes of gagging.  EXAM: ESOPHOGRAM/BARIUM SWALLOW  TECHNIQUE: Single contrast examination was performed using thin barium suspension.  FLUOROSCOPY TIME:  Fluoroscopy Time:  1 minutes and 35 seconds.  COMPARISON:  CT of the chest dated 08/29/2014  FINDINGS: Initial chest radiograph demonstrates mildly enlarged mediastinal silhouette. There is cardiomegaly. There are small bilateral pleural effusions and mild increased interstitial markings.  Please note that the esophagram is limited due to patient's inability to safely stand. Therefore, single contrast study with thin barium suspension was performed.  Upon opacification, the esophagus demonstrates torturous contour, without evidence of filling defects. There is a moderate hiatal hernia. The gastroesophageal junction demonstrates smooth narrowing, as the 13 mm barium tablet was not able to pass into the stomach.  Mild stasis of contrast is noted in bilateral piriform sinuses and valleculae.  Post procedural two-view radiograph of the chest demonstrates similar findings of cardiomegaly and small bilateral pleural effusions. The barium tablet is no longer visualized. There is no evidence of aspiration.  IMPRESSION: Torturous esophagus with a moderate in  size hiatal hernia.  Smooth narrowing at the gastroesophageal junction, with inability of the 13 mm barium tablet to pass.  Mild stasis in bilateral piriform sinuses and valleculae, without evidence of frank aspiration.  Correlation with upper endoscopy is recommended.  Cardiomegaly and small bilateral pleural effusions.   Electronically Signed   By: Ted Mcalpine M.D.   On: 10/13/2014 16:13   Dg Esophagus  10/13/2014   CLINICAL DATA:  Two episodes of gagging.  EXAM: ESOPHOGRAM/BARIUM SWALLOW  TECHNIQUE: Single contrast examination was performed  using thin barium suspension.  FLUOROSCOPY TIME:  Fluoroscopy Time:  1 minutes and 35 seconds.  COMPARISON:  CT of the chest dated 08/29/2014  FINDINGS: Initial chest radiograph demonstrates mildly enlarged mediastinal silhouette. There is cardiomegaly. There are small bilateral pleural effusions and mild increased interstitial markings.  Please note that the esophagram is limited due to patient's inability to safely stand. Therefore, single contrast study with thin barium suspension was performed.  Upon opacification, the esophagus demonstrates torturous contour, without evidence of filling defects. There is a moderate hiatal hernia. The gastroesophageal junction demonstrates smooth narrowing, as the 13 mm barium tablet was not able to pass into the stomach.  Mild stasis of contrast is noted in bilateral piriform sinuses and valleculae.  Post procedural two-view radiograph of the chest demonstrates similar findings of cardiomegaly and small bilateral pleural effusions. The barium tablet is no longer visualized. There is no evidence of aspiration.  IMPRESSION: Torturous esophagus with a moderate in size hiatal hernia.  Smooth narrowing at the gastroesophageal junction, with inability of the 13 mm barium tablet to pass.  Mild stasis in bilateral piriform sinuses and valleculae, without evidence of frank aspiration.  Correlation with upper endoscopy is recommended.   Cardiomegaly and small bilateral pleural effusions.   Electronically Signed   By: Ted Mcalpine M.D.   On: 10/13/2014 16:13    Microbiology: Recent Results (from the past 240 hour(s))  Urine culture     Status: None (Preliminary result)   Collection Time: 10/12/14  5:58 PM  Result Value Ref Range Status   Specimen Description URINE, CLEAN CATCH  Final   Special Requests NONE  Final   Culture >=100,000 COLONIES/mL GRAM NEGATIVE RODS  Final   Report Status PENDING  Incomplete     Labs: Basic Metabolic Panel:  Recent Labs Lab 10/12/14 1519 10/12/14 2046 10/13/14 0457 10/14/14 0558 10/16/14 0649  NA 122* 121* 127* 130* 132*  K 4.4 3.9 4.4 4.4 5.0  CL 84* 83* 89* 93* 95*  CO2 GLUCOSE 91 170* 90 95 112*  BUN 23* 22* CREATININE 0.85 0.93 0.67 0.70 0.65  CALCIUM 9.4 8.6* 8.6* 8.9 9.2   Liver Function Tests:  Recent Labs Lab 10/12/14 1519 10/14/14 0558  AST 22 21  ALT 14 13*  ALKPHOS 96 87  BILITOT 1.4* 0.5  PROT 6.6 6.0*  ALBUMIN 3.6 3.1*   No results for input(s): LIPASE, AMYLASE in the last 168 hours. No results for input(s): AMMONIA in the last 168 hours. CBC:  Recent Labs Lab 10/12/14 1519 10/13/14 0457 10/14/14 0558  WBC 13.5* 10.3 9.5  NEUTROABS 9.7*  --   --   HGB 14.0 11.5* 13.4  HCT 38.0 31.9* 37.8  MCV 90.0 91.4 93.3  PLT 412* 310 313   Cardiac Enzymes: No results for input(s): CKTOTAL, CKMB, CKMBINDEX, TROPONINI in the last 168 hours. BNP: BNP (last 3 results) No results for input(s): BNP in the last 8760 hours.  ProBNP (last 3 results) No results for input(s): PROBNP in the last 8760 hours.  CBG: No results for input(s): GLUCAP in the last 168 hours.     SignedAlbertine Grates MD, PhD  [facility] 10/16/2014, 10:08 AM

## 2014-10-16 NOTE — Progress Notes (Signed)
[name] 9:15 AM  Subjective: Patient doing well without any new problems and is swallowing fine however she is nothing by mouth this morning possibly for a speech therapy evaluation but not for endoscopy please see yesterday's note for details and she is swallowing fine water through a straw this morning  Objective: Vital signs stable afebrile no acute distress abdomen is soft nontender  Assessment: Gagging questionable etiology improved in patient with abnormal barium swallow  Plan: Happy to see back in the office if swallowing problems or gagging returns or if endoscopy and possible dilation is needed otherwise she seems to be doing fine and please call us back if we can be of any further assistance unfortunately her mother was not present this morning during my visit that her case was discussed with the patient and her nurse  [facility] E  Pager [phone] After 5PM or if no answer call [phone]

## 2014-10-16 NOTE — Evaluation (Signed)
Clinical/Bedside Swallow Evaluation Patient Details  Name: [name] MRN: [id number] Date of Birth: [birth year]  Today's Date: 10/16/2014 Time: SLP Start Time (ACUTE ONLY): 1018 SLP Stop Time (ACUTE ONLY): 1027 SLP Time Calculation (min) (ACUTE ONLY): 9 min  Past Medical History:  Past Medical History  Diagnosis Date  . Turner's syndrome   . Aortic insufficiency     a. Mod AI by echo 2013.  Marland Kitchen Hypothyroidism   . Periorbital edema   . Hypertension   . Cervix abnormality 1993    MILD ATYPICAL ADENOMATOUS HYPERPLASIA OF CERVIX  . Dermatitis 05-26-12    all over  since 9'13  . Edema of both legs     a. right greater than left, due to Turner's syndrome.  Marland Kitchen Speech disorder     occ. "stutter"  . Difficult intubation     will plan on spinal anesthesia"small mouth/airway"  . Abdominal aortic stenosis     a. Mild AS bye cho 2013.  Marland Kitchen Acute systolic congestive heart failure (HCC) 08/29/2014   Past Surgical History:  Past Surgical History  Procedure Laterality Date  . Total hip arthroplasty      LEFT  . US echocardiography  06/20/2009    EF 55-60%  . Dilation and curettage of uterus  1993    CONE BIOPSY  . Cervical cone biopsy  1993  . Hernia repair    . Colposcopy    . Joint replacement      hip replacement  . Total knee arthroplasty Right 05/31/2012    Procedure: RIGHT TOTAL KNEE ARTHROPLASTY;  Surgeon: Shelda Pal, MD;  Location: [facility];  Service: Orthopedics;  Laterality: Right;   HPI:  The patient is a 59 year old female with a history of Turner's syndrome, moderate aortic insufficiency and hypothyroidism. Recently she has been having trouble with gagging. Abnormal barium swallow 10/08 shows a tortuous esophagus, moderate size hiatal hernia, and a smooth tapering of the distal esophagus at the level of the esophagogastric junction. A 13 mm barium tablet was not able to pass through this area. No evidence of aspiration was noted.   Assessment / Plan /  Recommendation Clinical Impression  Pt has an immediate cough x1 with initial, large straw sip of thin liquids. Min cues provided by SLP for pacing resulted in no further signs of difficulty. Recommend Dys 3 diet and thin liquids with use of esophageal precautions, which were reviewed with the pt using teachback methods. Will f/u briefly as she remains in house for tolerance and utilization of strategies.    Aspiration Risk  Mild    Diet Recommendation Dysphagia 3 (Mech soft);Thin   Medication Administration: Crushed with puree Compensations: Slow rate;Small sips/bites;Follow solids with liquid    Other  Recommendations Oral Care Recommendations: Oral care BID   Follow Up Recommendations   tba, none anticipated    Frequency and Duration min 2x/week  1 week   Pertinent Vitals/Pain n/a    SLP Swallow Goals     Swallow Study Prior Functional Status       General Other Pertinent Information: The patient is a 59 year old female with a history of Turner's syndrome, moderate aortic insufficiency and hypothyroidism. Recently she has been having trouble with gagging. Abnormal barium swallow 10/08 shows a tortuous esophagus, moderate size hiatal hernia, and a smooth tapering of the distal esophagus at the level of the esophagogastric junction. A 13 mm barium tablet was not able to pass through this area. No evidence of aspiration was noted. Type  of Study: Bedside swallow evaluation Previous Swallow Assessment: BSE 09/03/14 recommending regular diet and thin liquids Diet Prior to this Study: NPO Temperature Spikes Noted: No Respiratory Status: Room air History of Recent Intubation: No Behavior/Cognition: Alert;Cooperative;Pleasant mood Self-Feeding Abilities: Able to feed self Patient Positioning: Upright in bed Baseline Vocal Quality: Normal    Oral/Motor/Sensory Function Overall Oral Motor/Sensory Function: Appears within functional limits for tasks assessed   Ice Chips Ice chips:  Not tested   Thin Liquid Thin Liquid: Impaired Presentation: Cup;Self Fed;Straw Pharyngeal  Phase Impairments: Cough - Immediate    Nectar Thick Nectar Thick Liquid: Not tested   Honey Thick Honey Thick Liquid: Not tested   Puree Puree: Within functional limits Presentation: Self Fed;Spoon   Solid    Solid: Within functional limits Presentation: Self Fed      Maxcine Ham, M.A. CCC-SLP [phone]  Maxcine Ham 10/16/2014,11:18 AM

## 2014-10-19 ENCOUNTER — Ambulatory Visit (INDEPENDENT_AMBULATORY_CARE_PROVIDER_SITE_OTHER): Payer: Medicaid Other | Admitting: Cardiology

## 2014-10-19 ENCOUNTER — Encounter: Payer: Self-pay | Admitting: Cardiology

## 2014-10-19 VITALS — BP 112/68 | HR 77 | Ht <= 58 in | Wt 138.8 lb

## 2014-10-19 DIAGNOSIS — I480 Paroxysmal atrial fibrillation: Secondary | ICD-10-CM | POA: Diagnosis not present

## 2014-10-19 DIAGNOSIS — I10 Essential (primary) hypertension: Secondary | ICD-10-CM

## 2014-10-19 DIAGNOSIS — E871 Hypo-osmolality and hyponatremia: Secondary | ICD-10-CM | POA: Diagnosis not present

## 2014-10-19 DIAGNOSIS — Q969 Turner's syndrome, unspecified: Secondary | ICD-10-CM | POA: Diagnosis not present

## 2014-10-19 LAB — BASIC METABOLIC PANEL
BUN: 16 mg/dL (ref 7–25)
CHLORIDE: 92 mmol/L — AB (ref 98–110)
CO2: 30 mmol/L (ref 20–31)
CREATININE: 0.81 mg/dL (ref 0.50–1.05)
Calcium: 9.7 mg/dL (ref 8.6–10.4)
Glucose, Bld: 71 mg/dL (ref 65–99)
POTASSIUM: 4.8 mmol/L (ref 3.5–5.3)
Sodium: 132 mmol/L — ABNORMAL LOW (ref 135–146)

## 2014-10-19 NOTE — Progress Notes (Signed)
Cardiology Office Note   Date:  10/19/2014   ID:  [name], DOB Nov 15, 1955, MRN [id number]  PCP:  [name], MD  Cardiologist: Cassell Clement MD  No chief complaint on file.     History of Present Illness: [name] is a 59 y.o. female who presents for scheduled follow-up visit. This patient has a history of Turner's syndrome.  She has a history of moderate aortic insufficiency.  She has a history of hypoxemia and sleep apnea.  She has a past history of paroxysmal atrial fibrillation.  She is on long-term anticoagulation with Apixaban 5 mg twice a day.  She has not had any TIA symptoms.  Her last echocardiogram on 08/28/14 showed an ejection fraction of 45-50% with moderate aortic insufficiency and mild mitral regurgitation and pulmonary artery pressure of 43.  He was hospitalized from 10/12/14 until 10/16/14 for respiratory difficulties.  She was found to be hyponatremic with a serum sodium of 122.  This responded to gentle hydration with saline and at discharge her serum sodium was up to 132. In the hospital she saw Dr. May God for GI and he plans to see her again as an outpatient and consider endoscopy She is followed by pulmonary.  She is on nasal oxygen around-the-clock and at night she uses a BiPAP at home    Past Medical History  Diagnosis Date  . Turner's syndrome   . Aortic insufficiency     a. Mod AI by echo 2013.  Marland Kitchen Hypothyroidism   . Periorbital edema   . Hypertension   . Cervix abnormality 1993    MILD ATYPICAL ADENOMATOUS HYPERPLASIA OF CERVIX  . Dermatitis 05-26-12    all over  since 9'13  . Edema of both legs     a. right greater than left, due to Turner's syndrome.  Marland Kitchen Speech disorder     occ. "stutter"  . Difficult intubation     will plan on spinal anesthesia"small mouth/airway"  . Abdominal aortic stenosis     a. Mild AS bye cho 2013.  Marland Kitchen Acute systolic congestive heart failure (HCC) 08/29/2014    Past Surgical History  Procedure  Laterality Date  . Total hip arthroplasty      LEFT  . US echocardiography  06/20/2009    EF 55-60%  . Dilation and curettage of uterus  1993    CONE BIOPSY  . Cervical cone biopsy  1993  . Hernia repair    . Colposcopy    . Joint replacement      hip replacement  . Total knee arthroplasty Right 05/31/2012    Procedure: RIGHT TOTAL KNEE ARTHROPLASTY;  Surgeon: Shelda Pal, MD;  Location: [facility];  Service: Orthopedics;  Laterality: Right;     Current Outpatient Prescriptions  Medication Sig Dispense Refill  . apixaban (ELIQUIS) 5 MG TABS tablet Take 1 tablet (5 mg total) by mouth 2 (two) times daily. 60 tablet 5  . Cholecalciferol (VITAMIN D3) 2000 UNITS capsule Take 2,000 Units by mouth daily.    . ciprofloxacin (CIPRO) 500 MG tablet Take 1 tablet (500 mg total) by mouth 2 (two) times daily. 6 tablet 0  . famotidine (PEPCID) 20 MG tablet Take 1 tablet (20 mg total) by mouth 2 (two) times daily. 60 tablet 0  . furosemide (LASIX) 40 MG tablet Take 1 tablet (40 mg total) by mouth daily. 60 tablet 0  . KLOR-CON M10 10 MEQ tablet TAKE ONE TABLET BY MOUTH TWICE DAILY 60  tablet 1  . levothyroxine (SYNTHROID, LEVOTHROID) 100 MCG tablet Take 100 mcg by mouth daily before breakfast.    . metoprolol tartrate (LOPRESSOR) 25 MG tablet Take 1 tablet (25 mg total) by mouth 2 (two) times daily. 45 tablet 0  . spironolactone (ALDACTONE) 25 MG tablet Take 25 mg by mouth 2 (two) times daily.    . traMADol (ULTRAM) 50 MG tablet Take 50 mg by mouth every 6 (six) hours as needed for moderate pain.     . [DISCONTINUED] potassium chloride (K-DUR) 10 MEQ tablet TAKE ONE TABLET BY MOUTH TWICE DAILY 60 tablet 5   No current facility-administered medications for this visit.    Allergies:   Cephalexin    Social History:  The patient  reports that she has never smoked. She has never used smokeless tobacco. She reports that she does not drink alcohol or use illicit drugs.   Family History:  The patient's  family history includes Atrial fibrillation in her mother; Breast cancer in her cousin; Cancer in her mother; Diabetes in her maternal uncle; Hypertension in her mother.    ROS:  Please see the history of present illness.   Otherwise, review of systems are positive for none.   All other systems are reviewed and negative.    PHYSICAL EXAM: VS:  BP 112/68 mmHg  Pulse 77  Ht  (1.422 m)  Wt 138 lb 12.8 oz (62.959 kg)  BMI 31.14 kg/m2 , BMI Body mass index is 31.14 kg/(m^2). GEN: Well nourished, well developed, in no acute distress HEENT: normal Neck: no JVD, carotid bruits, or masses Cardiac: RRR; no murmurs, rubs, or gallops,no edema  Respiratory:  clear to auscultation bilaterally, normal work of breathing GI: soft, nontender, nondistended, + BS MS: no deformity or atrophy Skin: warm and dry, no rash Neuro:  Strength and sensation are intact Psych: euthymic mood, full affect   EKG:  EKG is ordered today. The ekg ordered today demonstrates normal sinus rhythm with short PR interval   Recent Labs: 09/04/2014: Magnesium 2.0 10/12/2014: TSH 3.481 10/14/2014: ALT 13*; Hemoglobin 13.4; Platelets 313 10/16/2014: BUN 12; Creatinine, Ser 0.65; Potassium 5.0; Sodium 132*    Lipid Panel    Component Value Date/Time   CHOL 163 08/29/2014 0800   TRIG 163* 08/29/2014 0800   HDL 26* 08/29/2014 0800   CHOLHDL 6.3 08/29/2014 0800   VLDL 33 08/29/2014 0800   LDLCALC 104* 08/29/2014 0800      Wt Readings from Last 3 Encounters:  10/19/14 138 lb 12.8 oz (62.959 kg)  10/16/14 137 lb 11.2 oz (62.46 kg)  09/13/14 156 lb (70.761 kg)         ASSESSMENT AND PLAN:  1.  Paroxysmal atrial fibrillation on long-term Apixaban 2.  Recent hyponatremia secondary to total body salt depletion secondary to diuretics 3.  Turner's syndrome 4.  Moderate aortic insufficiency 5.  Chronic systolic congestive heart failure 6.  Chronic respiratory failure with hypercapnia and obstructive sleep  apnea   Current medicines are reviewed at length with the patient today.  The patient does not have concerns regarding medicines.  The following changes have been made:  no change  Labs/ tests ordered today include:   Orders Placed This Encounter  Procedures  . Basic metabolic panel  . EKG 12-Lead    Disposition: Continue current medication.  We are checking a basal metabolic panel today.  Recheck in 4 months for follow-up office visit EKG and basal metabolic panel  Signed, Cassell Clement MD  10/19/2014 6:15 PM    [facility] [address], [place], [place]  [address] Phone: [phone]; Fax: [phone]

## 2014-10-19 NOTE — Patient Instructions (Signed)
Medication Instructions:  Your physician recommends that you continue on your current medications as directed. Please refer to the Current Medication list given to you today.  Labwork: bmet  Testing/Procedures: none  Follow-Up: Your physician recommends that you schedule a follow-up appointment in: 4 month ov/ekg with Dawayne PatriciaLori G NP or Bing NeighborsScott W PA

## 2014-10-21 NOTE — Progress Notes (Signed)
Quick Note:  Please report to patient. The recent labs are stable. Continue same medication and careful diet. ______ 

## 2014-10-24 ENCOUNTER — Telehealth: Payer: Self-pay | Admitting: Pulmonary Disease

## 2014-10-24 ENCOUNTER — Ambulatory Visit: Payer: Medicaid Other | Admitting: Pulmonary Disease

## 2014-10-24 DIAGNOSIS — G4733 Obstructive sleep apnea (adult) (pediatric): Secondary | ICD-10-CM

## 2014-10-24 NOTE — Telephone Encounter (Signed)
Change to auto BiPAP settings IPAP 10-25 EPAP 5-15  Arrange F/U with DL in 2 weeks

## 2014-10-24 NOTE — Telephone Encounter (Signed)
Called and spoke to pt's mother. Informed her of the recs per RA. Orders placed. Appt scheduled for 12/2014 (appt today was cancelled). Pt aware to contact office for sooner appt to check it pt  Can be worked in sooner. Nothing further needed at this time.

## 2014-10-25 ENCOUNTER — Telehealth: Payer: Self-pay | Admitting: Pulmonary Disease

## 2014-10-25 DIAGNOSIS — G4733 Obstructive sleep apnea (adult) (pediatric): Secondary | ICD-10-CM

## 2014-10-25 NOTE — Telephone Encounter (Signed)
Pt is also requesting a new mask  Dr Vassie LollAlva please advise

## 2014-10-25 NOTE — Telephone Encounter (Signed)
Ok for both

## 2014-10-25 NOTE — Telephone Encounter (Signed)
Spoke with pt. States that she is not using the humidifier and would like it to be picked up.  RA - please advise. Thanks.

## 2014-10-25 NOTE — Telephone Encounter (Signed)
[facility]  Attempted to call pt to notify of orders Line busy and voicemail available   Will call back later

## 2014-10-26 ENCOUNTER — Telehealth: Payer: Self-pay | Admitting: Pulmonary Disease

## 2014-10-26 ENCOUNTER — Other Ambulatory Visit: Payer: Self-pay | Admitting: Cardiology

## 2014-10-26 NOTE — Telephone Encounter (Signed)
Pt calling regarding her order for BiPAP settings change. [facility] and wants to know when they will be contacting her. Sent msg to [employer]issa.  Awaiting response.

## 2014-10-26 NOTE — Telephone Encounter (Signed)
Pt returned call. Informed her of the new mask order and the humidifier being picked up. Pt verbalized understanding and denied any further questions or concerns at this time.

## 2014-10-26 NOTE — Telephone Encounter (Signed)
lmtcb

## 2014-10-26 NOTE — Telephone Encounter (Signed)
Per [name]: Auto BiPAP orders must have an EPAP Min, IPAP Max and PS setting. We cannot set ranges for IPAP or EPAP ourselves. Our RT manager's recommendation for this pt would be IPAP Max 25, EPAP Min, with a PS 5.   Pt notified. Nothing further needed.

## 2014-11-02 ENCOUNTER — Encounter: Payer: Self-pay | Admitting: Pulmonary Disease

## 2014-11-02 ENCOUNTER — Telehealth: Payer: Self-pay | Admitting: Pulmonary Disease

## 2014-11-02 DIAGNOSIS — J9612 Chronic respiratory failure with hypercapnia: Secondary | ICD-10-CM

## 2014-11-02 NOTE — Telephone Encounter (Signed)
Donna CowdenMichael B Wert, MD at 10/09/2014 1:20 PM     Status: Signed       Expand All Collapse All   Fine with me to do ambulatory 02 titration            Tommie SamsMindy S Silva, CMA at 10/09/2014 12:51 PM     Status: Signed       Expand All Collapse All   Pt mother called in. Requesting an order for pt to be evaluated for a [facility]. Please advise MW thanks      --  Called spoke w/ mother. She reports she never received a call [facility]. I have sent [name] a message to check on this.

## 2014-11-02 NOTE — Telephone Encounter (Signed)
Order for Ambulatory 02 titration has been entered. Patients mother notified. Nothing further needed.

## 2014-11-08 ENCOUNTER — Other Ambulatory Visit: Payer: Self-pay | Admitting: Cardiology

## 2014-11-08 MED ORDER — METOPROLOL TARTRATE 25 MG PO TABS: 25.0000 mg | ORAL_TABLET | Freq: Two times a day (BID) | ORAL | Status: DC

## 2014-11-12 ENCOUNTER — Telehealth: Payer: Self-pay | Admitting: Pulmonary Disease

## 2014-11-12 NOTE — Telephone Encounter (Signed)
Attempted to contact patient, line busy. wcb 

## 2014-11-13 NOTE — Telephone Encounter (Signed)
Spoke with pt's mother, states she could not remember the conversation that she had earlier with Robynn PaneElise.  Pt's mother wants a poc for pt for ambulation, but wants to know how much she would have to pay out of pocket for this.  I advised that we do not bill for poc's and would not be able to tell her this info, but that it would be discussed at [facility] ov tomorrow.    Will await call after ov.

## 2014-11-13 NOTE — Telephone Encounter (Signed)
[phone], pt mother cb

## 2014-11-13 NOTE — Telephone Encounter (Signed)
Called and spoke to pt's mother. Pt has an appt with [facility] on 11/9 and pt will call back after to decide if they want a new O2 system. Will await call.

## 2014-11-14 ENCOUNTER — Other Ambulatory Visit: Payer: Self-pay

## 2014-11-14 MED ORDER — FAMOTIDINE 20 MG PO TABS: 20.0000 mg | ORAL_TABLET | Freq: Two times a day (BID) | ORAL | Status: DC

## 2014-11-15 NOTE — Telephone Encounter (Signed)
Spoke with mother and patient just needed Rx for Pepcid which her PCP took care of

## 2014-12-12 ENCOUNTER — Ambulatory Visit (INDEPENDENT_AMBULATORY_CARE_PROVIDER_SITE_OTHER): Payer: Medicaid Other | Admitting: Pulmonary Disease

## 2014-12-12 ENCOUNTER — Encounter: Payer: Self-pay | Admitting: Pulmonary Disease

## 2014-12-12 VITALS — BP 142/82 | HR 84 | Ht 59.0 in | Wt 139.6 lb

## 2014-12-12 DIAGNOSIS — G4733 Obstructive sleep apnea (adult) (pediatric): Secondary | ICD-10-CM | POA: Diagnosis not present

## 2014-12-12 DIAGNOSIS — J9612 Chronic respiratory failure with hypercapnia: Secondary | ICD-10-CM | POA: Diagnosis not present

## 2014-12-12 NOTE — Patient Instructions (Addendum)
For mask fitting session, please call sleep Center at [phone] for appointment We will see if we can adjust mask leak He can stop using oxygen in the daytime-continue using oxygen during sleep

## 2014-12-12 NOTE — Progress Notes (Signed)
   Subjective:    Patient ID: [name], female    DOB: Aug 03, 1955, 59 y.o.   MRN: [id number]  HPI  [age] yowf never smoker with Turner's syndrome previously followed by Dr Shelle Ironlance for osa On CPAP but dc'd on bipap and 02 3lpm p admit 08/2014 due to hypercarbic resp failure & CHF/ atrial fibn. Prior to this, she was on noct O2 only  12/12/2014  Chief Complaint  Patient presents with  . Sleep Apnea    patient wearing CPAP every night; wears 3L oxygen with CPAP.  Mask is making a lot of noise, thinks it may be leaking.   Accompanied by mother, [name] On prior download it was noted that she had some breakthrough events because of significant mask leak. She is maintained on 3 L of oxygen since her discharge-wonders if she still needs this. She is feeling good She has diuresed well from her discharge weight of 15o over current weight of 139 pounds She is noted to have systolic CHF with EF 45% Breathing is much improved  She did not desaturate on walking on room air today  Significant tests/ events  NPSG 08/29/12:  Severe, with an AHI of 79 Download 04/2013:  Good compliance, AHI 17/hr, + leaks Download 06/2014:  Good compliance, AHI 12 with significant mask leak Bipap titration 08/2014-optimal 25/20 with small fullface mask  - placed on autobipap  09/04/14 > download 12/2014 AHI 20/h, leak ++, avg 18/13, excellent usage  Review of Systems neg for any significant sore throat, dysphagia, itching, sneezing, nasal congestion or excess/ purulent secretions, fever, chills, sweats, unintended wt loss, pleuritic or exertional cp, hempoptysis, orthopnea pnd or change in chronic leg swelling. Also denies presyncope, palpitations, heartburn, abdominal pain, nausea, vomiting, diarrhea or change in bowel or urinary habits, dysuria,hematuria, rash, arthralgias, visual complaints, headache, numbness weakness or ataxia.     Objective:   Physical Exam  Gen. Pleasant, obese, in no distress ENT - no  lesions, no post nasal drip Neck: No JVD, no thyromegaly, no carotid bruits Lungs: no use of accessory muscles, no dullness to percussion, decreased without rales or rhonchi  Cardiovascular: Rhythm regular, heart sounds  normal, no murmurs or gallops, no peripheral edema Musculoskeletal: No deformities, no cyanosis or clubbing , no tremors       Assessment & Plan:

## 2014-12-12 NOTE — Assessment & Plan Note (Addendum)
For mask fitting session, please call sleep Center at [phone] for appointment We will see if we can adjust mask leak   Weight loss encouraged, compliance with goal of at least 4-6 hrs every night is the expectation. Advised against medications with sedative side effects Cautioned against driving when sleepy - understanding that sleepiness will vary on a day to day basis

## 2014-12-13 NOTE — Assessment & Plan Note (Signed)
-  can stop using oxygen in the daytime-continue using oxygen during sleep

## 2014-12-17 ENCOUNTER — Telehealth: Payer: Self-pay | Admitting: Cardiology

## 2014-12-17 NOTE — Telephone Encounter (Signed)
Mother wanted to know if patient should be taking Diltiazem Advised that was d/c when she left the hospital in October

## 2014-12-17 NOTE — Telephone Encounter (Signed)
New message ° ° ° ° °Talk to Melinda about her medications °

## 2014-12-20 ENCOUNTER — Telehealth: Payer: Self-pay | Admitting: Cardiology

## 2014-12-20 ENCOUNTER — Other Ambulatory Visit: Payer: Self-pay | Admitting: *Deleted

## 2014-12-20 MED ORDER — FUROSEMIDE 40 MG PO TABS: 40.0000 mg | ORAL_TABLET | Freq: Every day | ORAL | Status: DC

## 2014-12-20 NOTE — Telephone Encounter (Signed)
New message ° ° ° ° °Talk to [name] about her medications °

## 2014-12-20 NOTE — Telephone Encounter (Signed)
Mother called just to confirm Lasix sent to pharmacy

## 2015-01-01 ENCOUNTER — Telehealth: Payer: Self-pay | Admitting: Pulmonary Disease

## 2015-01-01 NOTE — Telephone Encounter (Signed)
Called spoke with pt mother. She reports they are getting a bill each month for $210 for the BIPAP. She reports pt can't afford this every month. When they contact the DME they were told this is the part they have to pay after the insurance pays it's part. She reports when pt had the CPAP machine, they did not have to pay anything monthly and now they own the CPAP. Requesting recs from RA as per mother they can't keep the BIPAP d/t the monthly payment. Please advise thanks

## 2015-01-03 ENCOUNTER — Telehealth: Payer: Self-pay | Admitting: Pulmonary Disease

## 2015-01-03 NOTE — Telephone Encounter (Signed)
Called spoke with pt mother. She reports one of the boxes weren't checked on the handicap placard form. She reports she just can't jump in the car and come up here bc we didn't do something right. She then d/c'd the call. Will sign off message

## 2015-01-07 NOTE — Telephone Encounter (Signed)
Ideally she should be on BiPAP-this had better results than the CPAP Please investigate with DME why she has such a high bill ? Steroid need documentation I can provide to decrease this ?

## 2015-01-08 NOTE — Telephone Encounter (Signed)
lmtcb x1 for pt's mother. 

## 2015-01-09 NOTE — Telephone Encounter (Signed)
Called [name] with [facility] and she gave number for billing dept- [phone]  Spoke with Larita FifeLynn in the billing dept  She states that the bill is higher since there is no documentation that CPAP was failed  She is going to reach out to [place], who specializes in the CPAP/BIPAP dept for advise on what we can do to help  Will await her call back

## 2015-01-09 NOTE — Telephone Encounter (Signed)
Spoke with [name].  She said that she is being billed private pay because she was discharged out of the hospital on BiPAP.  She was only titrated to BiPAP and did not do CPAP failure.  She will  need to have CPAP Titration done to show failure on CPAP machine.   Dr. Vassie LollAlva, ok to order CPAP Titration? Please advise.

## 2015-01-09 NOTE — Telephone Encounter (Signed)
[name] returned call [phone] °

## 2015-01-10 NOTE — Telephone Encounter (Signed)
lmtcb x1 for [name]. 

## 2015-01-10 NOTE — Telephone Encounter (Signed)
   please let [facility] know - She had persistent events on CPAP, hence BiPAP titration was ordered  she also required very high pressure - hence needs BiPAP   If they have a problem,  We will send to a different DME

## 2015-01-11 NOTE — Telephone Encounter (Signed)
[name]  [name], CMA           Thanks Mindy.  I have passed this information along to [employer]ie, our Arts development officerT manager. Awaiting his response

## 2015-01-11 NOTE — Telephone Encounter (Signed)
I have sent [name] a staff message will await response.

## 2015-01-14 NOTE — Telephone Encounter (Signed)
lmtcb X1 for [name].  

## 2015-01-15 NOTE — Telephone Encounter (Signed)
Awaiting message from [place]issa.

## 2015-01-16 ENCOUNTER — Encounter: Payer: Self-pay | Admitting: Pulmonary Disease

## 2015-01-16 NOTE — Telephone Encounter (Signed)
Still [facility] message from [facility].

## 2015-01-16 NOTE — Telephone Encounter (Signed)
[name]  [name], CMA           I have submitted all of the documentation that I can find from this patient's hospital admission to our respiratory manager. He will review and determine if it will meet [facility] requirements. The patient was started on bipap in the hospital. She was set up with home bipap at discharge.  The problem has been that the pt only had a bipap titration study done post discharge instead of having a study showing that she failed cpap and then had to be moved to bipap.  I'll let you know what I find out.

## 2015-01-17 ENCOUNTER — Telehealth: Payer: Self-pay | Admitting: Pulmonary Disease

## 2015-01-17 NOTE — Telephone Encounter (Signed)
Spoke with pt's mother, states that her cp[facility] is [facility].  I advised that we are still working with [facility] to see what needs to be done to get pt's bipap covered by insurance, and that we would keep her updated as we have updates to give.  Will close this message as this problem is being followed up through another open telephone encounter.  Nothing further needed at this time.

## 2015-01-18 NOTE — Telephone Encounter (Signed)
lmtcb x1 for [name]. 

## 2015-01-18 NOTE — Telephone Encounter (Signed)
[name] returning call.[name] °

## 2015-01-18 NOTE — Telephone Encounter (Signed)
Spoke with [name], states she was able to pull hospital notes and [facility].  States that CMN is being to our office for bipap.   lmtcb X1 for pt's mother to make aware of this.   Will continue to hold to ensure cmn has been received.

## 2015-01-23 NOTE — Telephone Encounter (Signed)
I just checked Dr. Reginia Naas CMN folder and we have not received the CMN. I sent [name], [facility] a staff message to get someone to resend to the office

## 2015-01-23 NOTE — Telephone Encounter (Signed)
Spoke with the pt's mother and notified of that hospital notes have been submitted to insurance  Will forward to [place] to check on status of CMN

## 2015-02-18 ENCOUNTER — Ambulatory Visit: Payer: Medicaid Other | Admitting: Cardiology

## 2015-02-21 ENCOUNTER — Ambulatory Visit: Payer: Medicaid Other | Admitting: Cardiology

## 2015-02-25 ENCOUNTER — Ambulatory Visit: Payer: Medicaid Other | Admitting: Cardiology

## 2015-03-01 ENCOUNTER — Ambulatory Visit (INDEPENDENT_AMBULATORY_CARE_PROVIDER_SITE_OTHER): Payer: Medicaid Other | Admitting: Cardiology

## 2015-03-01 VITALS — BP 140/70 | HR 78 | Ht <= 58 in | Wt 142.8 lb

## 2015-03-01 DIAGNOSIS — I351 Nonrheumatic aortic (valve) insufficiency: Secondary | ICD-10-CM

## 2015-03-01 DIAGNOSIS — I10 Essential (primary) hypertension: Secondary | ICD-10-CM

## 2015-03-01 DIAGNOSIS — I480 Paroxysmal atrial fibrillation: Secondary | ICD-10-CM | POA: Diagnosis not present

## 2015-03-01 DIAGNOSIS — I119 Hypertensive heart disease without heart failure: Secondary | ICD-10-CM | POA: Diagnosis not present

## 2015-03-01 DIAGNOSIS — Q969 Turner's syndrome, unspecified: Secondary | ICD-10-CM

## 2015-03-01 LAB — BASIC METABOLIC PANEL
BUN: 21 mg/dL (ref 7–25)
CALCIUM: 9.3 mg/dL (ref 8.6–10.4)
CO2: 27 mmol/L (ref 20–31)
CREATININE: 0.8 mg/dL (ref 0.50–0.99)
Chloride: 101 mmol/L (ref 98–110)
GLUCOSE: 103 mg/dL — AB (ref 65–99)
Potassium: 4.1 mmol/L (ref 3.5–5.3)
Sodium: 136 mmol/L (ref 135–146)

## 2015-03-01 NOTE — Patient Instructions (Signed)
Medication Instructions:  Your physician recommends that you continue on your current medications as directed. Please refer to the Current Medication list given to you today.  Labwork: bmet  Testing/Procedures: none  Follow-Up: Your physician wants you to follow-up in: 6 month ov with Dr Prescott Parma will receive a reminder letter in the mail two months in advance. If you don't receive a letter, please call our office to schedule the follow-up appointment.  Any Other Special Instructions Will Be Listed Below (If Applicable).     If you need a refill on your cardiac medications before your next appointment, please call your pharmacy.

## 2015-03-01 NOTE — Progress Notes (Signed)
Cardiology Office Note   Date:  03/01/2015   ID:  [name], [name] 1955-07-05, MRN [id number]  PCP:  [name], MD  Cardiologist: Cassell Clement MD  Chief Complaint  Patient presents with  . scheduled follow up      History of Present Illness: [name] is a 60 y.o. female who presents for a scheduled follow-up office visit.  This patient has a history of Turner's syndrome. She has a history of moderate aortic insufficiency. She has a history of hypoxemia and sleep apnea. She has a past history of paroxysmal atrial fibrillation. She is on long-term anticoagulation with Apixaban 5 mg twice a day. She has not had any TIA symptoms. Her last echocardiogram on 08/28/14 showed an ejection fraction of 45-50% with moderate aortic insufficiency and mild mitral regurgitation and pulmonary artery pressure of 43. He was hospitalized from 10/12/14 until 10/16/14 for respiratory difficulties. She was found to be hyponatremic with a serum sodium of 122. This responded to gentle hydration with saline and at discharge her serum sodium was up to 132. She is followed by pulmonary. She is on nasal oxygen around-the-clock and at night she uses a BiPAP at home since last visit she has been feeling well.  Her appetite is good.  Her weight is up 3 pounds.  She has not been experiencing any observable fluid retention.  She denies any chest pain.  She has not had any peripheral edema.  She is on spironolactone and furosemide and we are checking blood work today . He has not been aware of any recurrent atrial fibrillation.  She has not been aware of any bleeding from her Apixaban.   Past Medical History  Diagnosis Date  . Turner's syndrome   . Aortic insufficiency     a. Mod AI by echo 2013.  Marland Kitchen Hypothyroidism   . Periorbital edema   . Hypertension   . Cervix abnormality 1993    MILD ATYPICAL ADENOMATOUS HYPERPLASIA OF CERVIX  . Dermatitis 05-26-12    all over  since 9'13  . Edema of  both legs     a. right greater than left, due to Turner's syndrome.  Marland Kitchen Speech disorder     occ. "stutter"  . Difficult intubation     will plan on spinal anesthesia"small mouth/airway"  . Abdominal aortic stenosis     a. Mild AS bye cho 2013.  Marland Kitchen Acute systolic congestive heart failure (HCC) 08/29/2014    Past Surgical History  Procedure Laterality Date  . Total hip arthroplasty      LEFT  . US echocardiography  06/20/2009    EF 55-60%  . Dilation and curettage of uterus  1993    CONE BIOPSY  . Cervical cone biopsy  1993  . Hernia repair    . Colposcopy    . Joint replacement      hip replacement  . Total knee arthroplasty Right 05/31/2012    Procedure: RIGHT TOTAL KNEE ARTHROPLASTY;  Surgeon: Shelda Pal, MD;  Location: [facility];  Service: Orthopedics;  Laterality: Right;     Current Outpatient Prescriptions  Medication Sig Dispense Refill  . apixaban (ELIQUIS) 5 MG TABS tablet Take 1 tablet (5 mg total) by mouth 2 (two) times daily. 60 tablet 5  . Cholecalciferol (VITAMIN D3) 2000 UNITS capsule Take 2,000 Units by mouth daily.    . famotidine (PEPCID) 20 MG tablet Take 1 tablet (20 mg total) by mouth 2 (two) times daily. 60 tablet  11  . furosemide (LASIX) 40 MG tablet Take 1 tablet (40 mg total) by mouth daily. 30 tablet 9  . KLOR-CON M10 10 MEQ tablet TAKE ONE TABLET BY MOUTH TWICE DAILY 60 tablet 6  . levothyroxine (SYNTHROID, LEVOTHROID) 88 MCG tablet Take 88 mcg by mouth daily before breakfast.    . metoprolol tartrate (LOPRESSOR) 25 MG tablet Take 1 tablet (25 mg total) by mouth 2 (two) times daily. 60 tablet 11  . spironolactone (ALDACTONE) 25 MG tablet Take 25 mg by mouth 2 (two) times daily.    . traMADol (ULTRAM) 50 MG tablet Take 50 mg by mouth every 6 (six) hours as needed for moderate pain.     . [DISCONTINUED] potassium chloride (K-DUR) 10 MEQ tablet TAKE ONE TABLET BY MOUTH TWICE DAILY 60 tablet 5   No current facility-administered medications for this visit.     Allergies:   Cephalexin    Social History:  The patient  reports that she has never smoked. She has never used smokeless tobacco. She reports that she does not drink alcohol or use illicit drugs.   Family History:  The patient's family history includes Atrial fibrillation in her mother; Breast cancer in her cousin; Cancer in her mother; Diabetes in her maternal uncle; Hypertension in her mother.    ROS:  Please see the history of present illness.   Otherwise, review of systems are positive for none.   All other systems are reviewed and negative.    PHYSICAL EXAM: VS:  BP 140/70 mmHg  Pulse 78  Ht  (1.422 m)  Wt 142 lb 12.8 oz (64.774 kg)  BMI 32.03 kg/m2 , BMI Body mass index is 32.03 kg/(m^2). GEN: Well nourished, well developed, in no acute distress HEENT: normal.  There is a webbed neck consistent with Turner's syndrome Neck: no JVD, carotid bruits, or masses Cardiac: RRR; there is a grade 2/6 decrescendo murmur of aortic insufficiency at the left sternal edge. No  rubs, or gallops,no edema  Respiratory:  clear to auscultation bilaterally, normal work of breathing GI: soft, nontender, nondistended, + BS MS: no deformity or atrophy Skin: warm and dry, no rash Neuro:  Strength and sensation are intact Psych: euthymic mood, full affect   EKG:  EKG is ordered today. The ekg ordered today demonstrates normal sinus rhythm.  Nonspecific ST abnormality.  Since prior tracing of 10/19/14, no significant change  Recent Labs: 09/04/2014: Magnesium 2.0 10/12/2014: TSH 3.481 10/14/2014: ALT 13*; Hemoglobin 13.4; Platelets 313 10/19/2014: BUN 16; Creat 0.81; Potassium 4.8; Sodium 132*    Lipid Panel    Component Value Date/Time   CHOL 163 08/29/2014 0800   TRIG 163* 08/29/2014 0800   HDL 26* 08/29/2014 0800   CHOLHDL 6.3 08/29/2014 0800   VLDL 33 08/29/2014 0800   LDLCALC 104* 08/29/2014 0800      Wt Readings from Last 3 Encounters:  03/01/15 142 lb 12.8 oz (64.774 kg)   12/12/14 139 lb 9.6 oz (63.322 kg)  10/19/14 138 lb 12.8 oz (62.959 kg)        ASSESSMENT AND PLAN:   1. Paroxysmal atrial fibrillation on long-term Apixaban. currently in normal sinus rhythm  2. Recent hyponatremia secondary to total body salt depletion secondary to diuretics. we are rechecking a BMET today  3. Turner's syndrome 4. Moderate aortic insufficiency 5. Chronic systolic congestive heart failure, clinically euvolemic on current diuretic therapy  6. Chronic respiratory failure with hypercapnia and obstructive sleep apnea, followed by pulmonary   Current  medicines are reviewed at length with the patient today.  The patient does not have concerns regarding medicines.  The following changes have been made:  no change  Labs/ tests ordered today include:   Orders Placed This Encounter  Procedures  . Basic metabolic panel  . EKG 12-Lead     Disposition:   Continue current medication.  Return in 6 months for office visit and EKG with Dr.Nahser  Signed, Cassell Clement MD 03/01/2015 3:15 PM    [facility] [address] [place], [place], [place]  [address] Phone: [phone]; Fax: [phone]

## 2015-03-03 NOTE — Progress Notes (Signed)
Quick Note:  Please report to patient. The recent labs are stable. Continue same medication and careful diet. ______ 

## 2015-04-01 ENCOUNTER — Other Ambulatory Visit: Payer: Self-pay | Admitting: Cardiology

## 2015-04-03 ENCOUNTER — Encounter: Payer: Self-pay | Admitting: Pulmonary Disease

## 2015-04-03 ENCOUNTER — Ambulatory Visit (INDEPENDENT_AMBULATORY_CARE_PROVIDER_SITE_OTHER): Payer: Medicaid Other | Admitting: Pulmonary Disease

## 2015-04-03 VITALS — BP 118/64 | HR 80 | Ht <= 58 in | Wt 144.2 lb

## 2015-04-03 DIAGNOSIS — G4733 Obstructive sleep apnea (adult) (pediatric): Secondary | ICD-10-CM

## 2015-04-03 DIAGNOSIS — J9612 Chronic respiratory failure with hypercapnia: Secondary | ICD-10-CM | POA: Diagnosis not present

## 2015-04-03 NOTE — Assessment & Plan Note (Addendum)
  Your bipap is on auto settings- 3L blended , avg pr 18/13 We could increase the pressure but I doubt she would tolerate that. We may have to titrate incomplete titration for better compliance here  Weight loss encouraged, compliance with goal of at least 4-6 hrs every night is the expectation. Advised against medications with sedative side effects Cautioned against driving when sleepy - understanding that sleepiness will vary on a day to day basis

## 2015-04-03 NOTE — Patient Instructions (Signed)
Check oxygen levels on biPAP - do not use oxygen that night Your bipap is on auto settings-

## 2015-04-03 NOTE — Progress Notes (Signed)
   Subjective:    Patient ID: [name], female    DOB: 1955/10/28, 60 y.o.   MRN: [id number]  HPI  [age] yowf never smoker with Turner's syndrome previously followed by Dr Shelle Ironlance for osa On CPAP but dc'd on bipap and 02 3lpm p admit 08/2014 due to hypercarbic resp failure & CHF/ atrial fibn.   04/03/2015  Chief Complaint  Patient presents with  . Follow-up    Doing well on BiPAP; Patient no longer uses oxygen during day, only uses it at night.  [facility] requesting that patient have o2 qualification again.      Accompanied by mother, [name] On prior download it was noted that she had some breakthrough events because of significant mask leak. She is maintained on 3 L of oxygen since her discharge- Able to stay off O2 daytime She has received a letter from DME about O2 certification  Download 03/2015 >> on auto bipap-avg 18/13, residual AHI 15/h , good usage, Previous download has also shown residual obstructive events She has systolic CHF with EF 45% Breathing is much improved  She did not desaturate on walking on room air last visit  Significant tests/ events  NPSG 08/29/12:  Severe, with an AHI of 79 Download 04/2013:  Good compliance, AHI 17/hr, + leaks Download 06/2014:  Good compliance, AHI 12 with significant mask leak Bipap titration 08/2014-optimal 25/20 with small fullface mask  - placed on autobipap  09/04/14 > download 12/2014 AHI 20/h, leak ++, avg 18/13, excellent usage   Review of Systems Patient denies significant dyspnea,cough, hemoptysis,  chest pain, palpitations, pedal edema, orthopnea, paroxysmal nocturnal dyspnea, lightheadedness, nausea, vomiting, abdominal or  leg pains      Objective:   Physical Exam  Gen. Pleasant, obese, in no distress, uses walker ENT - no lesions, no post nasal drip Neck: No JVD, no thyromegaly, no carotid bruits Lungs: no use of accessory muscles, no dullness to percussion, decreased without rales or rhonchi  Cardiovascular: Rhythm  regular, heart sounds  normal, no murmurs or gallops, no peripheral edema Musculoskeletal: No deformities, no cyanosis or clubbing , no tremors       Assessment & Plan:

## 2015-04-03 NOTE — Assessment & Plan Note (Signed)
Check oxygen levels on biPAP  To re-qualify- do not use oxygen that night

## 2015-04-04 ENCOUNTER — Telehealth: Payer: Self-pay | Admitting: Pulmonary Disease

## 2015-04-04 NOTE — Telephone Encounter (Signed)
Spoke with [name], needing clarification of test being done.  Advised that an order for an ONO on BiPAP WITHOUT O2 was placed and [facility]. Explained why Dr Vassie LollAlva said to NOT use the O2 during the test and why were are doing the test with just the BiPAP. Expressed understanding. [name] states that they have an app[facility] on Tuesday [facility]09/[facility]. Aware that she can call our office if there are any further questions once she gets the device home. Nothing further needed.

## 2015-04-11 ENCOUNTER — Other Ambulatory Visit: Payer: Self-pay | Admitting: Cardiology

## 2015-04-12 ENCOUNTER — Ambulatory Visit: Payer: Medicaid Other | Admitting: Adult Health

## 2015-04-12 NOTE — Telephone Encounter (Signed)
Left message needs to get from PCP

## 2015-04-26 ENCOUNTER — Encounter: Payer: Self-pay | Admitting: Pulmonary Disease

## 2015-04-29 ENCOUNTER — Telehealth: Payer: Self-pay | Admitting: Pulmonary Disease

## 2015-04-29 NOTE — Telephone Encounter (Signed)
Spoke with pt's mother and she wanted to know who had ordered the CT scan while pt was in the hospital 8/16. Advised mother which physician had ordered the CT and that it was likely a hospitalist since she does not recognize the name as one of her current physicians. Also advised mother that the CT can be reviewed at their next OV. Pt's mother is agreeable to this. Nothing further needed at this time.

## 2015-05-15 ENCOUNTER — Telehealth: Payer: Self-pay | Admitting: Pulmonary Disease

## 2015-05-15 DIAGNOSIS — G4733 Obstructive sleep apnea (adult) (pediatric): Secondary | ICD-10-CM

## 2015-05-15 NOTE — Telephone Encounter (Signed)
Attempted to contact [name], but phone was busy, will call back.

## 2015-05-16 NOTE — Telephone Encounter (Signed)
Spoke with pt's mother - requesting results of recent ONO.  Dr. Vassie LollAlva, please advise.  Thank you.

## 2015-05-16 NOTE — Telephone Encounter (Signed)
ONO on CPAP/RA no desatn Can dc O2

## 2015-05-17 ENCOUNTER — Telehealth: Payer: Self-pay | Admitting: Pulmonary Disease

## 2015-05-17 NOTE — Telephone Encounter (Signed)
Attempted to contact pt. No answer, no option to leave a message. Will try back.  

## 2015-05-17 NOTE — Telephone Encounter (Signed)
Pt aware of results of ONO - aware to not use O2 at night.  DME: [facility] aware to d/c noct O2.

## 2015-05-20 NOTE — Telephone Encounter (Signed)
[phone], pt mother cb °

## 2015-05-20 NOTE — Telephone Encounter (Signed)
Pt needing clarification of results of ONO. Aware that [facility]   Oretha Milchakesh Alva V, MD at 05/16/2015 5:36 PM     Status: Signed       Expand All Collapse All   ONO on CPAP/RA no desatn Can dc O2         Nothing further needed.

## 2015-05-21 ENCOUNTER — Telehealth: Payer: Self-pay | Admitting: Pulmonary Disease

## 2015-05-21 NOTE — Telephone Encounter (Signed)
Oxygen saturation about 92%

## 2015-05-21 NOTE — Telephone Encounter (Signed)
Spoke with patient Mother, wanting to know the specific numbers from the ONO Mother is very concerned with discontinuing the O2. I have explained that with the BiPAP her breathing is controlled and her O2 levels are staying at the expected level - pt does not need additional O2 at night according to the results of the ONO. Please advise Dr Vassie LollAlva. Thanks.    Oretha Milchakesh Alva V, MD at 05/16/2015 5:36 PM     Status: Signed       Expand All Collapse All  ONO on CPAP/RA no desatn Can dc O2

## 2015-05-21 NOTE — Telephone Encounter (Signed)
Called, spoke with pt's mother.  Discussed below per RA.  Mother verbalized understanding and voiced no further questions or concerns at this time.  States DME is scheduled to d/c o2 tomorrow.

## 2015-05-24 ENCOUNTER — Other Ambulatory Visit: Payer: Self-pay | Admitting: Cardiovascular Disease

## 2015-05-24 MED ORDER — POTASSIUM CHLORIDE CRYS ER 10 MEQ PO TBCR: 10.0000 meq | EXTENDED_RELEASE_TABLET | Freq: Two times a day (BID) | ORAL | Status: DC

## 2015-05-29 ENCOUNTER — Encounter: Payer: Self-pay | Admitting: Pulmonary Disease

## 2015-08-19 ENCOUNTER — Encounter: Payer: Self-pay | Admitting: Physician Assistant

## 2015-08-22 ENCOUNTER — Other Ambulatory Visit: Payer: Self-pay | Admitting: Cardiovascular Disease

## 2015-09-01 NOTE — Progress Notes (Signed)
Cardiology Office Note:    Date:  09/02/2015   ID:  [name], DOB 1955-08-08, MRN [id number]  PCP:  [name], MD  Cardiologist:  Dr. Cassell Clement >> Dr. Delane Ginger   Electrophysiologist:  N/a Pulmonologist: Dr. Vassie Loll   Referring MD: [name], MD   No chief complaint on file. FU AFib, AI, CHF  History of Present Illness:    [name] is a 60 y.o. female with a hx of Turner's syndrome, mod AI, combined systolic and diastolic CHF, hypoxemia, sleep apnea, PAF, prior hyponatremia 2/2 total body Na depletion from diuretics.  She is on long term anticoagulation with Apixaban.  Echo in 8/16 demonstrated EF 45-50%. This was done in the setting of AF with RVR and interpretation was more difficult. Last seen by Dr. Cassell Clement in 2/17.  FU was recommended with Dr. Delane Ginger.   She returns for FU.  She is here with her mother who is also a prior patient of Dr. Patty Sermons.  She is doing well. She denies chest pain or significant dyspnea.  She has chronic R leg edema without change.  She denies orthopnea, PND, syncope. She has occ BRBPR that is probably from hemorrhoids.  She is due for FU colonoscopy soon. She uses BiPap at night for OSA.    Prior CV studies that were reviewed today include:    Echo 08/28/14 Mild LVH, EF 45-50%, diffuse HK, moderate AI, mild dilated ascending aorta, MAC, mild MR, severe LAE, mildly reduced RVSF, mild RAE, PASP 43 mmHg  Echo 10/13 EF 55-60%, normal wall motion, grade 2 diastolic dysfunction, mild aortic stenosis, moderate aortic insufficiency, moderate LAE, mild RAE   Past Medical History:  Diagnosis Date  . Abdominal aortic stenosis    a. Mild AS bye cho 2013.  Marland Kitchen Acute systolic congestive heart failure (HCC) 08/29/2014  . Aortic insufficiency    a. Mod AI by echo 2013.  Marland Kitchen Cervix abnormality 1993   MILD ATYPICAL ADENOMATOUS HYPERPLASIA OF CERVIX  . Dermatitis 05-26-12   all over  since 9'13  . Difficult intubation    will  plan on spinal anesthesia"small mouth/airway"  . Edema of both legs    a. right greater than left, due to Turner's syndrome.  Marland Kitchen Hypertension   . Hypothyroidism   . Periorbital edema   . Speech disorder    occ. "stutter"  . Turner's syndrome     Past Surgical History:  Procedure Laterality Date  . CERVICAL CONE BIOPSY  1993  . COLPOSCOPY    . DILATION AND CURETTAGE OF UTERUS  1993   CONE BIOPSY  . HERNIA REPAIR    . JOINT REPLACEMENT     hip replacement  . TOTAL HIP ARTHROPLASTY     LEFT  . TOTAL KNEE ARTHROPLASTY Right 05/31/2012   Procedure: RIGHT TOTAL KNEE ARTHROPLASTY;  Surgeon: Shelda Pal, MD;  Location: [facility];  Service: Orthopedics;  Laterality: Right;  . US ECHOCARDIOGRAPHY  06/20/2009   EF 55-60%    Current Medications: Outpatient Medications Prior to Visit  Medication Sig Dispense Refill  . Cholecalciferol (VITAMIN D3) 2000 UNITS capsule Take 2,000 Units by mouth daily.    Marland Kitchen ELIQUIS 5 MG TABS tablet TAKE ONE TABLET BY MOUTH TWICE DAILY 60 tablet 10  . furosemide (LASIX) 40 MG tablet Take 1 tablet (40 mg total) by mouth daily. 30 tablet 9  . levothyroxine (SYNTHROID, LEVOTHROID) 88 MCG tablet Take 88 mcg by mouth daily before breakfast.    .  metoprolol tartrate (LOPRESSOR) 25 MG tablet Take 1 tablet (25 mg total) by mouth 2 (two) times daily. 60 tablet 11  . potassium chloride (KLOR-CON M10) 10 MEQ tablet Take 1 tablet (10 mEq total) by mouth 2 (two) times daily. 90 tablet 1  . spironolactone (ALDACTONE) 25 MG tablet Take 25 mg by mouth 2 (two) times daily.    . traMADol (ULTRAM) 50 MG tablet Take 50 mg by mouth every 6 (six) hours as needed for moderate pain.     . famotidine (PEPCID) 20 MG tablet Take 1 tablet (20 mg total) by mouth 2 (two) times daily. 60 tablet 11   No facility-administered medications prior to visit.       Allergies:   Cephalexin   Social History   Social History  . Marital status: Single    Spouse name: N/A  . Number of children:  N/A  . Years of education: N/A   Occupational History  . N/A Not Employed   Social History Main Topics  . Smoking status: Never Smoker  . Smokeless tobacco: Never Used  . Alcohol use No  . Drug use: No  . Sexual activity: No   Other Topics Concern  . None   Social History Narrative  . None     Family History:  The patient's family history includes Atrial fibrillation in her mother; Breast cancer in her cousin; Cancer in her mother; Diabetes in her maternal uncle; Hypertension in her mother.   ROS:   Please see the history of present illness.    ROS All other systems reviewed and are negative.   EKGs/Labs/Other Test Reviewed:    EKG:  EKG is ordered today.  The ekg ordered today demonstrates NSR, HR 68, normal axis, nonspecific ST-T wave changes, QTc 435 ms, similar to prior tracing   Recent Labs: 09/04/2014: Magnesium 2.0 10/12/2014: TSH 3.481 10/14/2014: ALT 13; Hemoglobin 13.4; Platelets 313 03/01/2015: BUN 21; Creat 0.80; Potassium 4.1; Sodium 136   Recent Lipid Panel    Component Value Date/Time   CHOL 163 08/29/2014 0800   TRIG 163 (H) 08/29/2014 0800   HDL 26 (L) 08/29/2014 0800   CHOLHDL 6.3 08/29/2014 0800   VLDL 33 08/29/2014 0800   LDLCALC 104 (H) 08/29/2014 0800     Physical Exam:    VS:  BP 124/78   Pulse 68   Ht 4\' 8"  (1.422 m)   Wt 148 lb 1.9 oz (67.2 kg)   BMI 33.21 kg/m     Wt Readings from Last 3 Encounters:  09/02/15 148 lb 1.9 oz (67.2 kg)  04/03/15 144 lb 3.2 oz (65.4 kg)  03/01/15 142 lb 12.8 oz (64.8 kg)     Physical Exam  Constitutional: She is oriented to person, place, and time. She appears well-developed and well-nourished. No distress.  HENT:  Head: Atraumatic.  Eyes: No scleral icterus.  Neck: No JVD present.  Webbed neck c/w Turner syndrome   Cardiovascular: Normal rate, S1 normal and S2 normal.   No murmur heard. Pulmonary/Chest: Effort normal. She has no wheezes. She has no rales.  Abdominal: Soft. There is no  tenderness.  Musculoskeletal: She exhibits edema.  1+ RLE edema; trace-1+ LLE edema  Neurological: She is alert and oriented to person, place, and time.  Skin: Skin is warm and dry.  Psychiatric: She has a normal mood and affect.    ASSESSMENT:    1. Paroxysmal atrial fibrillation (HCC)   2. Aortic insufficiency   3. Chronic combined systolic  and diastolic congestive heart failure (HCC)   4. Essential hypertension   5. Turner syndrome    PLAN:    In order of problems listed above:  1. PAF - Maintaining NSR. She remains on Apixaban.  Will get FU BMET, CBC today.    2. Aortic Insufficiency - Clinically stable.  Will arrange FU Echo.  3. Chronic combined systolic and diastolic CHF - Volume stable.  Check FU BMET today.  Will get repeat echo.  Of note, low EF previously noted in setting of AF with RVR.  4. HTN - BP is well controlled on current regimen.    5. Turner Syndrome - Echo in 8/16 with ascending aorta 38.15 mm.  AV has been noted to be trileaflet. She does have mod AI by Echo in 8/16.  Will get FU Echo.     Medication Adjustments/Labs and Tests Ordered: Current medicines are reviewed at length with the patient today.  Concerns regarding medicines are outlined above.  Medication changes, Labs and Tests ordered today are outlined in the Patient Instructions noted below. Patient Instructions  Medication Instructions:  Your physician recommends that you continue on your current medications as directed. Please refer to the Current Medication list given to you today. Labwork: TODAY BMET, CBC W/DIFF Testing/Procedures: Your physician has requested that you have an echocardiogram. Echocardiography is a painless test that uses sound waves to create images of your heart. It provides your doctor with information about the size and shape of your heart and how well your heart's chambers and valves are working. This procedure takes approximately one hour. There are no restrictions for  this procedure. Follow-Up: Your physician wants you to follow-up in: 6 MONTHS WITH [place], PAC. You will receive a reminder letter in the mail two months in advance. If you don't receive a letter, please call our office to schedule the follow-up appointment. Any Other Special Instructions Will Be Listed Below (If Applicable). If you need a refill on your cardiac medications before your next appointment, please call your pharmacy.  Signed, Tereso Newcomer, PA-C  09/02/2015 4:42 PM    [facility] [address] [place], [place], [place]  [address] Phone: [phone]; Fax: [phone]

## 2015-09-02 ENCOUNTER — Ambulatory Visit (INDEPENDENT_AMBULATORY_CARE_PROVIDER_SITE_OTHER): Payer: Medicaid Other | Admitting: Physician Assistant

## 2015-09-02 ENCOUNTER — Encounter: Payer: Self-pay | Admitting: Physician Assistant

## 2015-09-02 VITALS — BP 124/78 | HR 68 | Ht <= 58 in | Wt 148.1 lb

## 2015-09-02 DIAGNOSIS — Q969 Turner's syndrome, unspecified: Secondary | ICD-10-CM

## 2015-09-02 DIAGNOSIS — I351 Nonrheumatic aortic (valve) insufficiency: Secondary | ICD-10-CM | POA: Diagnosis not present

## 2015-09-02 DIAGNOSIS — I10 Essential (primary) hypertension: Secondary | ICD-10-CM

## 2015-09-02 DIAGNOSIS — I5042 Chronic combined systolic (congestive) and diastolic (congestive) heart failure: Secondary | ICD-10-CM | POA: Diagnosis not present

## 2015-09-02 DIAGNOSIS — I480 Paroxysmal atrial fibrillation: Secondary | ICD-10-CM

## 2015-09-02 LAB — CBC WITH DIFFERENTIAL/PLATELET
BASOS ABS: 0 {cells}/uL (ref 0–200)
Basophils Relative: 0 %
EOS ABS: 121 {cells}/uL (ref 15–500)
Eosinophils Relative: 1 %
HCT: 42.1 % (ref 35.0–45.0)
Hemoglobin: 14.8 g/dL (ref 11.7–15.5)
LYMPHS PCT: 19 %
Lymphs Abs: 2299 cells/uL (ref 850–3900)
MCH: 34.3 pg — AB (ref 27.0–33.0)
MCHC: 35.2 g/dL (ref 32.0–36.0)
MCV: 97.7 fL (ref 80.0–100.0)
MONOS PCT: 14 %
MPV: 10 fL (ref 7.5–12.5)
Monocytes Absolute: 1694 cells/uL — ABNORMAL HIGH (ref 200–950)
Neutro Abs: 7986 cells/uL — ABNORMAL HIGH (ref 1500–7800)
Neutrophils Relative %: 66 %
PLATELETS: 337 10*3/uL (ref 140–400)
RBC: 4.31 MIL/uL (ref 3.80–5.10)
RDW: 12.8 % (ref 11.0–15.0)
WBC: 12.1 10*3/uL — ABNORMAL HIGH (ref 3.8–10.8)

## 2015-09-02 LAB — BASIC METABOLIC PANEL
BUN: 22 mg/dL (ref 7–25)
CALCIUM: 9.9 mg/dL (ref 8.6–10.4)
CHLORIDE: 95 mmol/L — AB (ref 98–110)
CO2: 30 mmol/L (ref 20–31)
CREATININE: 0.85 mg/dL (ref 0.50–0.99)
Glucose, Bld: 77 mg/dL (ref 65–99)
Potassium: 4.5 mmol/L (ref 3.5–5.3)
Sodium: 134 mmol/L — ABNORMAL LOW (ref 135–146)

## 2015-09-02 NOTE — Patient Instructions (Addendum)
Medication Instructions:  Your physician recommends that you continue on your current medications as directed. Please refer to the Current Medication list given to you today. Labwork: TODAY BMET, CBC W/DIFF Testing/Procedures: Your physician has requested that you have an echocardiogram. Echocardiography is a painless test that uses sound waves to create images of your heart. It provides your doctor with information about the size and shape of your heart and how well your heart's chambers and valves are working. This procedure takes approximately one hour. There are no restrictions for this procedure. Follow-Up: Your physician wants you to follow-up in: 6 MONTHS WITH [place], PAC. You will receive a reminder letter in the mail two months in advance. If you don't receive a letter, please call our office to schedule the follow-up appointment. Any Other Special Instructions Will Be Listed Below (If Applicable). If you need a refill on your cardiac medications before your next appointment, please call your pharmacy.

## 2015-09-03 ENCOUNTER — Telehealth: Payer: Self-pay | Admitting: *Deleted

## 2015-09-03 NOTE — Telephone Encounter (Signed)
Pt notified of lab results and findings by phone with verbal understanding. Pt advised to f/u w/PCP in regards to WBC elevated. Pt agreeable. I will fax results to PCP.

## 2015-09-03 NOTE — Telephone Encounter (Signed)
Lmtcb to go over lab results and recommendation.

## 2015-09-15 ENCOUNTER — Other Ambulatory Visit: Payer: Self-pay | Admitting: Internal Medicine

## 2015-09-16 ENCOUNTER — Other Ambulatory Visit: Payer: Self-pay

## 2015-09-16 MED ORDER — FUROSEMIDE 40 MG PO TABS: 40.0000 mg | ORAL_TABLET | Freq: Every day | ORAL | 11 refills | Status: DC

## 2015-09-18 ENCOUNTER — Other Ambulatory Visit: Payer: Self-pay | Admitting: Internal Medicine

## 2015-09-18 ENCOUNTER — Telehealth: Payer: Self-pay | Admitting: Pulmonary Disease

## 2015-09-18 MED ORDER — SPIRONOLACTONE 25 MG PO TABS: 25.0000 mg | ORAL_TABLET | Freq: Two times a day (BID) | ORAL | 0 refills | Status: DC

## 2015-09-18 NOTE — Telephone Encounter (Signed)
Ok x one refill but whoever is dosing her other diuretics and monitoring them should be refilling in future ? Cardiology

## 2015-09-18 NOTE — Telephone Encounter (Signed)
Spoke with pt's mother and advised of refill and MW's request to defer further refills to cardiology. Rx sent. Nothing further needed.

## 2015-09-18 NOTE — Telephone Encounter (Signed)
Spoke with pt's mother and she is requesting refill on Spironolactone. She states that pt has one more day of doses left. She needs sent in today please.  MW - Please advise on refill. Thanks!   LOV  09/13/14 with you, 04/03/15 with RA for OSA  Instructions   Aldactone 25 mg twice daily and the swelling should improve.   You will need your kidney function rechecked one week after you start the aldactone   Keep appt with Dr Vassie LollAlva

## 2015-09-24 ENCOUNTER — Other Ambulatory Visit (HOSPITAL_COMMUNITY): Payer: Medicaid Other

## 2015-10-01 ENCOUNTER — Ambulatory Visit: Payer: Medicaid Other | Admitting: Adult Health

## 2015-10-04 ENCOUNTER — Ambulatory Visit: Payer: Medicaid Other | Admitting: Adult Health

## 2015-10-08 ENCOUNTER — Ambulatory Visit (HOSPITAL_COMMUNITY): Payer: Medicaid Other | Attending: Cardiovascular Disease

## 2015-10-08 ENCOUNTER — Other Ambulatory Visit: Payer: Self-pay

## 2015-10-08 ENCOUNTER — Encounter: Payer: Self-pay | Admitting: Physician Assistant

## 2015-10-08 DIAGNOSIS — G4733 Obstructive sleep apnea (adult) (pediatric): Secondary | ICD-10-CM | POA: Insufficient documentation

## 2015-10-08 DIAGNOSIS — I110 Hypertensive heart disease with heart failure: Secondary | ICD-10-CM | POA: Insufficient documentation

## 2015-10-08 DIAGNOSIS — I351 Nonrheumatic aortic (valve) insufficiency: Secondary | ICD-10-CM | POA: Diagnosis not present

## 2015-10-08 DIAGNOSIS — Z6833 Body mass index (BMI) 33.0-33.9, adult: Secondary | ICD-10-CM | POA: Diagnosis not present

## 2015-10-08 DIAGNOSIS — E669 Obesity, unspecified: Secondary | ICD-10-CM | POA: Diagnosis not present

## 2015-10-08 DIAGNOSIS — Q969 Turner's syndrome, unspecified: Secondary | ICD-10-CM | POA: Diagnosis not present

## 2015-10-08 DIAGNOSIS — I509 Heart failure, unspecified: Secondary | ICD-10-CM | POA: Insufficient documentation

## 2015-10-09 ENCOUNTER — Telehealth: Payer: Self-pay | Admitting: *Deleted

## 2015-10-09 NOTE — Telephone Encounter (Signed)
DPR on file ok to s/w pt's mother. Mother has been notified of pt's echo results and findings by phone with verbal understanding.

## 2015-10-14 ENCOUNTER — Encounter: Payer: Self-pay | Admitting: Adult Health

## 2015-10-15 ENCOUNTER — Ambulatory Visit (INDEPENDENT_AMBULATORY_CARE_PROVIDER_SITE_OTHER): Payer: Medicaid Other | Admitting: Adult Health

## 2015-10-15 ENCOUNTER — Encounter: Payer: Self-pay | Admitting: Adult Health

## 2015-10-15 VITALS — BP 124/72 | HR 79 | Temp 98.6°F | Ht <= 58 in | Wt 149.0 lb

## 2015-10-15 DIAGNOSIS — G4733 Obstructive sleep apnea (adult) (pediatric): Secondary | ICD-10-CM

## 2015-10-15 DIAGNOSIS — R911 Solitary pulmonary nodule: Secondary | ICD-10-CM | POA: Diagnosis not present

## 2015-10-15 NOTE — Assessment & Plan Note (Signed)
Download shows excellent compliance on BiPAP. However, patient continues to have breakthrough events. AHI 28. Patient will be set up for a BiPAP titration study. Also, hopefully they can look at her mask to help with a better fitting mask.   Plan  Patient Instructions  Continue on BIPAP At bedtime  .  Work on weight loss  Do not drive if sleepy  We are setting you up for a BIPAP titration study .  Set up for a CT chest to follow lung nodule .  follow up Dr. Vassie LollAlva  In 6 months and As needed

## 2015-10-15 NOTE — Patient Instructions (Signed)
Continue on BIPAP At bedtime  .  Work on weight loss  Do not drive if sleepy  We are setting you up for a BIPAP titration study .  Set up for a CT chest to follow lung nodule .  follow up Dr. Vassie LollAlva  In 6 months and As needed

## 2015-10-15 NOTE — Assessment & Plan Note (Signed)
Lingular lung nodule in never smoker , found incidentally on CT chest 08/2014  Will set up for follow up CT chest w/o contrast

## 2015-10-15 NOTE — Progress Notes (Signed)
Subjective:    Patient ID: [name], female    DOB: 03-04-1955, 60 y.o.   MRN: [id number]  HPI 60 year old female never smoker with Turner syndrome followed for obstructive sleep apnea on BiPAP with 3 L of oxygen She has systolic congestive heart failure (EF 45%)   TEST  NPSG 08/29/12:  Severe, with an AHI of 79 Download 04/2013:  Good compliance, AHI 17/hr, + leaks Download 06/2014:  Good compliance, AHI 12 with significant mask leak Bipap titration 08/2014-optimal 25/20 with small fullface mask  - placed on autobipap  09/04/14 > download 12/2014 AHI 20/h, leak ++, avg 18/13, excellent usage  10/15/2015 follow-up obstructive sleep apnea on BiPAP Patient presents for a six-month follow-up for obstructive sleep apnea. Patient is accompanied by her mother. Patient says that she is doing well on BiPAP. Download shows Excellent compliance with avg usage at 5h , AHI 28, +++Leaks.  On Vauto IPAP 25, EPAP 5 , PS 5  ONO on BIPAP showed no desats. Nocturnal O2 stopped in April 2017 .  She says it makes a lot of noice.  She has a small full face mask. Patient does have a lot of mask leaks, she has went to sleep lab for mask fitting but has not helped.  We discussed setting up for a BIPAP titration study to see if we can get better control .   Pt says she had a CT chest 08/2014 that showed a small nodule . Chart review shows CT chest done on 08/29/2014 with a 7 mm lingular nodule. Patient is a never smoker. We discussed a follow-up CT to follow this nodule. She denies any hemoptysis or unintentional weight loss.     Past Medical History:  Diagnosis Date  . Abdominal aortic stenosis    a. Mild AS bye cho 2013.  Marland Kitchen. Acute systolic congestive heart failure (HCC) 08/29/2014  . Aortic insufficiency    a. Mod AI by echo 2013.//  b. Echo 10/17: EF 60-65%, normal wall motion, grade 1 diastolic dysfunction, moderate AI, ascending aorta 35 mm, aortic root 30 mm  . Cervix abnormality 1993   MILD  ATYPICAL ADENOMATOUS HYPERPLASIA OF CERVIX  . Dermatitis 05-26-12   all over  since 9'13  . Difficult intubation    will plan on spinal anesthesia"small mouth/airway"  . Edema of both legs    a. right greater than left, due to Turner's syndrome.  Marland Kitchen. Hypertension   . Hypothyroidism   . Periorbital edema   . Speech disorder    occ. "stutter"  . Turner's syndrome    Current Outpatient Prescriptions on File Prior to Visit  Medication Sig Dispense Refill  . Cholecalciferol (VITAMIN D3) 2000 UNITS capsule Take 2,000 Units by mouth daily.    Marland Kitchen. ELIQUIS 5 MG TABS tablet TAKE ONE TABLET BY MOUTH TWICE DAILY 60 tablet 10  . famotidine (PEPCID) 40 MG tablet Take 40 mg by mouth 2 (two) times daily.    . furosemide (LASIX) 40 MG tablet Take 1 tablet (40 mg total) by mouth daily. 30 tablet 11  . levothyroxine (SYNTHROID, LEVOTHROID) 88 MCG tablet Take 88 mcg by mouth daily before breakfast.    . metoprolol tartrate (LOPRESSOR) 25 MG tablet Take 1 tablet (25 mg total) by mouth 2 (two) times daily. 60 tablet 11  . potassium chloride (KLOR-CON M10) 10 MEQ tablet Take 1 tablet (10 mEq total) by mouth 2 (two) times daily. 90 tablet 1  . spironolactone (ALDACTONE) 25 MG tablet Take 1  tablet (25 mg total) by mouth 2 (two) times daily. 60 tablet 0  . traMADol (ULTRAM) 50 MG tablet Take 50 mg by mouth every 6 (six) hours as needed for moderate pain.     . [DISCONTINUED] potassium chloride (K-DUR) 10 MEQ tablet TAKE ONE TABLET BY MOUTH TWICE DAILY 60 tablet 5   No current facility-administered medications on file prior to visit.     Review of Systems Constitutional:   No  weight loss, night sweats,  Fevers, chills, fatigue, or  lassitude.  HEENT:   No headaches,  Difficulty swallowing,  Tooth/dental problems, or  Sore throat,                No sneezing, itching, ear ache, nasal congestion, post nasal drip,   CV:  No chest pain,  Orthopnea, PND, swelling in lower extremities, anasarca, dizziness,  palpitations, syncope.   GI  No heartburn, indigestion, abdominal pain, nausea, vomiting, diarrhea, change in bowel habits, loss of appetite, bloody stools.   Resp: No shortness of breath with exertion or at rest.  No excess mucus, no productive cough,  No non-productive cough,  No coughing up of blood.  No change in color of mucus.  No wheezing.  No chest wall deformity  Skin: no rash or lesions.  GU: no dysuria, change in color of urine, no urgency or frequency.  No flank pain, no hematuria   MS:  No joint pain or swelling.  No decreased range of motion.  No back pain.  Psych:  No change in mood or affect. No depression or anxiety.  No memory loss.         Objective:   Physical Exam   Vitals:   10/15/15 1501  BP: 124/72  Pulse: 79  Temp: 98.6 F (37 C)  TempSrc: Oral  SpO2: 97%  Weight: 149 lb (67.6 kg)  Height: 4\' 8"  (1.422 m)   Body mass index is 33.41 kg/m.   GEN: A/Ox3; pleasant , NAD    HEENT:  /AT,  EACs-clear, TMs-wnl, NOSE-clear, THROAT-clear, no lesions, no postnasal drip or exudate noted. Class 2-3 MP airway with narrow post pharynx.   NECK:  Supple w/ fair ROM; no JVD; normal carotid impulses w/o bruits; no thyromegaly or nodules palpated; no lymphadenopathy.    RESP  Clear  P & A; w/o, wheezes/ rales/ or rhonchi. no accessory muscle use, no dullness to percussion  CARD:  RRR, no m/r/g  , no peripheral edema, pulses intact, no cyanosis or clubbing.  GI:   Soft & nt; nml bowel sounds; no organomegaly or masses detected.   Musco: Warm bil, no deformities or joint swelling noted.   Neuro: alert, no focal deficits noted.    Skin: Warm, no lesions or rashes  Kianah Harries NP-C  [facility]  10/15/2015        Assessment & Plan:

## 2015-10-15 NOTE — Addendum Note (Signed)
Addended by: Karalee HeightOX, Emie Sommerfeld P on: 10/15/2015 03:37 PM   Modules accepted: Orders

## 2015-10-16 ENCOUNTER — Other Ambulatory Visit: Payer: Self-pay | Admitting: Internal Medicine

## 2015-10-21 ENCOUNTER — Telehealth: Payer: Self-pay | Admitting: Internal Medicine

## 2015-10-21 MED ORDER — SPIRONOLACTONE 25 MG PO TABS: 25.0000 mg | ORAL_TABLET | Freq: Two times a day (BID) | ORAL | 0 refills | Status: DC

## 2015-10-21 NOTE — Telephone Encounter (Signed)
I spoke with the pt's mother and notified will refill this x 1 only and further refills need to be deferred to PCP  She verbalized understanding

## 2015-10-23 ENCOUNTER — Ambulatory Visit (INDEPENDENT_AMBULATORY_CARE_PROVIDER_SITE_OTHER)
Admission: RE | Admit: 2015-10-23 | Discharge: 2015-10-23 | Disposition: A | Discharge status: Home / Self Care | Payer: Medicaid Other | Source: Ambulatory Visit | Attending: Adult Health | Admitting: Adult Health

## 2015-10-23 DIAGNOSIS — R911 Solitary pulmonary nodule: Secondary | ICD-10-CM

## 2015-10-24 ENCOUNTER — Telehealth: Payer: Self-pay | Admitting: Pulmonary Disease

## 2015-10-24 NOTE — Progress Notes (Signed)
LVM for pt to return call

## 2015-10-24 NOTE — Telephone Encounter (Signed)
Patient mom calling back regarding CT results. She can be reached at [phone]-pr

## 2015-10-24 NOTE — Telephone Encounter (Signed)
Notes Recorded by Karalee HeightAmanda P Cox, CMA on 10/24/2015 at 12:40 PM EDT LVM for pt to return call. ------ Notes Recorded by Julio Sicksammy S Parrett, NP on 10/24/2015 at 11:51 AM EDT Left lung nodule is slightly smaller, likely benign process  Cont w/ ov recs Never smoker ------------------------------------------------------- Spoke with pt's mother. She is aware of results. Nothing further was needed.

## 2015-10-25 NOTE — Progress Notes (Signed)
lmtcb x2 

## 2015-10-28 NOTE — Progress Notes (Signed)
Called spoke with pt. Reviewed results and recs. Pt voiced understanding and had no further questions.

## 2015-11-04 ENCOUNTER — Other Ambulatory Visit: Payer: Self-pay | Admitting: Physician Assistant

## 2015-11-04 MED ORDER — METOPROLOL TARTRATE 25 MG PO TABS: 25.0000 mg | ORAL_TABLET | Freq: Two times a day (BID) | ORAL | 9 refills | Status: DC

## 2015-11-05 ENCOUNTER — Telehealth: Payer: Self-pay | Admitting: Pulmonary Disease

## 2015-11-05 NOTE — Telephone Encounter (Signed)
Julio Sicksammy S Parrett, NP (Nurse Practitioner)    Download shows excellent compliance on BiPAP. However, patient continues to have breakthrough events. AHI 28. Patient will be set up for a BiPAP titration study. Also, hopefully they can look at her mask to help with a better fitting mask.      lmtcb X1 to review recs from 10/10 office visit as to why pt is having another sleep study.

## 2015-11-06 NOTE — Telephone Encounter (Signed)
Reviewed last ov note with [name] (pt mother) and the need for another sleep study. [name] states pt has titration scheduled for 12-24-15. she voiced understanding and had no further questions. Nothing further needed.

## 2015-11-06 NOTE — Telephone Encounter (Signed)
Patient mother called back - would like to speak to only Morrie Sheldonshley - She can be reached at [phone]-pr

## 2015-11-18 ENCOUNTER — Other Ambulatory Visit: Payer: Self-pay | Admitting: Internal Medicine

## 2015-11-21 ENCOUNTER — Other Ambulatory Visit: Payer: Self-pay | Admitting: Cardiovascular Disease

## 2015-12-02 ENCOUNTER — Encounter (HOSPITAL_BASED_OUTPATIENT_CLINIC_OR_DEPARTMENT_OTHER): Payer: Medicaid Other

## 2015-12-07 IMAGING — DX DG CHEST 2V
2 series · 2 of 2 positions shown · non-contrast
Comparison: CT of the chest dated 08/29/2014

CLINICAL DATA: Two episodes of gagging.

EXAM:
ESOPHOGRAM/BARIUM SWALLOW
TECHNIQUE: Single contrast examination was performed using thin barium
suspension.
FLUOROSCOPY TIME:  Fluoroscopy Time:  1 minutes and 35 seconds.

[chest lat]
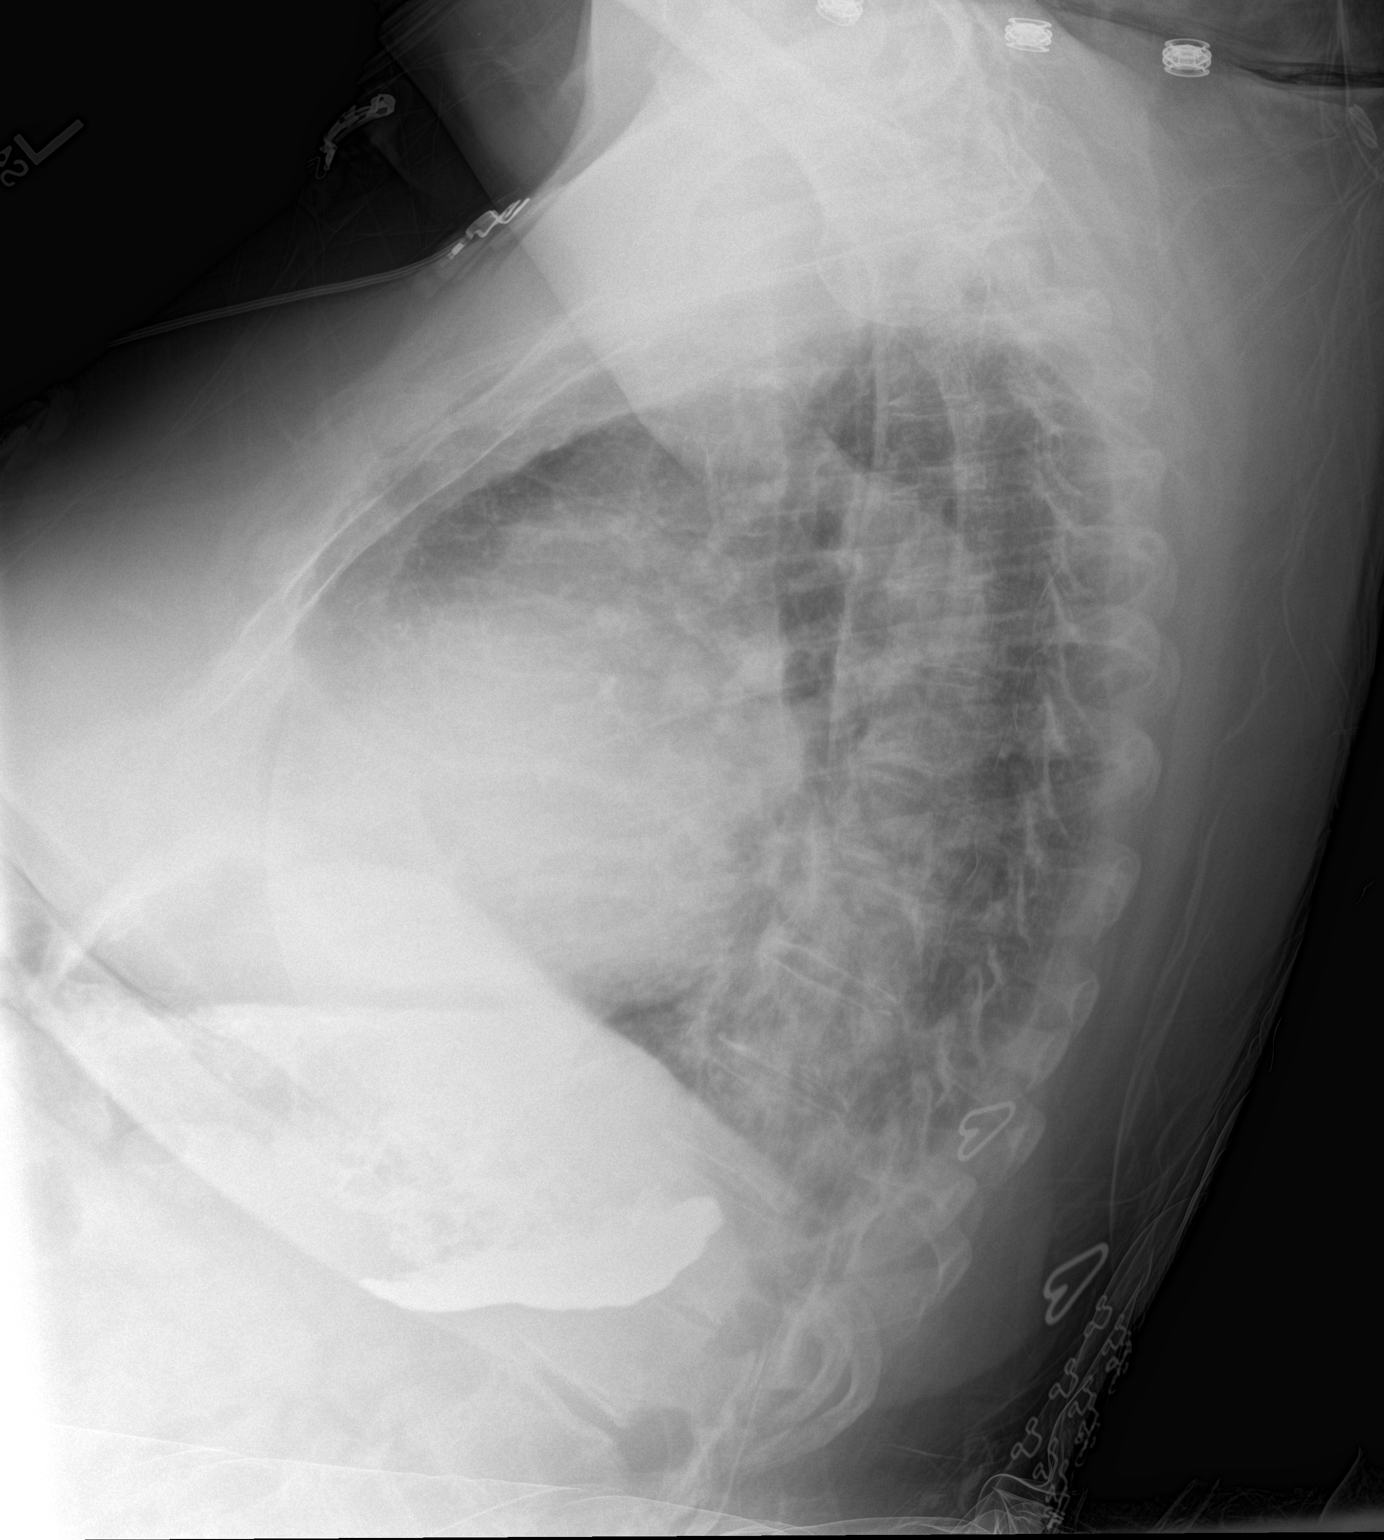

[chest ap]
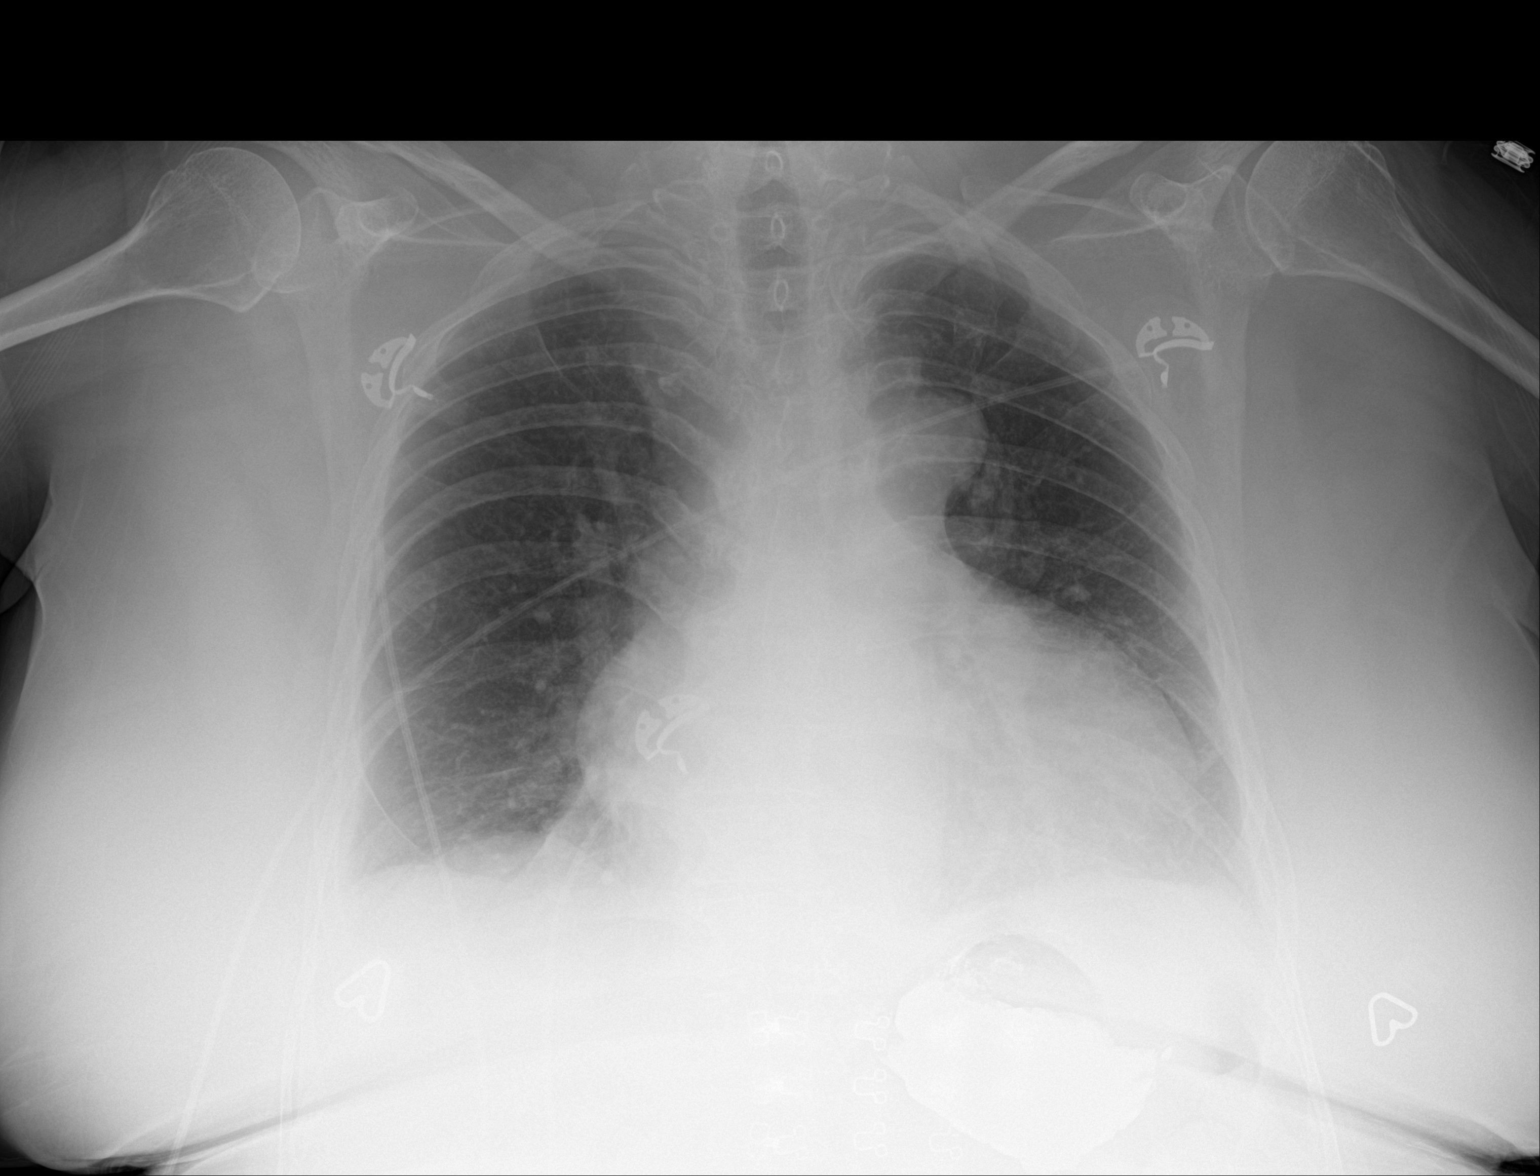

[2 of 2 positions shown; findings below may reference images not displayed]

FINDINGS: Initial chest radiograph demonstrates mildly enlarged mediastinal
silhouette. There is cardiomegaly. There are small bilateral pleural
effusions and mild increased interstitial markings.

Please note that the esophagram is limited due to patient's
inability to safely stand. Therefore, single contrast study with
thin barium suspension was performed.

Upon opacification, the esophagus demonstrates torturous contour,
without evidence of filling defects. There is a moderate hiatal
hernia. The gastroesophageal junction demonstrates smooth narrowing,
as the 13 mm barium tablet was not able to pass into the stomach.

Mild stasis of contrast is noted in bilateral piriform sinuses and
valleculae.

Post procedural two-view radiograph of the chest demonstrates
similar findings of cardiomegaly and small bilateral pleural
effusions. The barium tablet is no longer visualized. There is no
evidence of aspiration.
IMPRESSION: Torturous esophagus with a moderate in size hiatal hernia.

Smooth narrowing at the gastroesophageal junction, with inability of
the 13 mm barium tablet to pass.

Mild stasis in bilateral piriform sinuses and valleculae, without
evidence of frank aspiration.

Correlation with upper endoscopy is recommended.

Cardiomegaly and small bilateral pleural effusions.

## 2015-12-24 ENCOUNTER — Encounter (HOSPITAL_BASED_OUTPATIENT_CLINIC_OR_DEPARTMENT_OTHER): Payer: Medicaid Other

## 2016-01-08 ENCOUNTER — Encounter (HOSPITAL_BASED_OUTPATIENT_CLINIC_OR_DEPARTMENT_OTHER): Payer: Medicaid Other

## 2016-01-11 NOTE — Progress Notes (Signed)
Reviewed & agree with plan  

## 2016-02-27 ENCOUNTER — Other Ambulatory Visit: Payer: Self-pay | Admitting: *Deleted

## 2016-02-27 MED ORDER — APIXABAN 5 MG PO TABS: 5.0000 mg | ORAL_TABLET | Freq: Two times a day (BID) | ORAL | 5 refills | Status: DC

## 2016-02-27 NOTE — Telephone Encounter (Signed)
Pt last saw Sott Weaver on 09/02/15, last labs 09/02/15 Creat 0.85, age-40, weight 67.6kg, based on specified criteria pt is on appropriate dosage of Eliquis 5mg  BID.  Will refill rx x 6 months.

## 2016-03-09 ENCOUNTER — Ambulatory Visit: Payer: Medicaid Other | Admitting: Physician Assistant

## 2016-03-17 ENCOUNTER — Ambulatory Visit: Payer: Medicaid Other | Admitting: Physician Assistant

## 2016-04-09 ENCOUNTER — Encounter: Payer: Self-pay | Admitting: Physician Assistant

## 2016-04-27 ENCOUNTER — Ambulatory Visit: Payer: Medicaid Other | Admitting: Physician Assistant

## 2016-05-12 ENCOUNTER — Ambulatory Visit (INDEPENDENT_AMBULATORY_CARE_PROVIDER_SITE_OTHER): Payer: Medicaid Other | Admitting: Physician Assistant

## 2016-05-12 ENCOUNTER — Encounter: Payer: Self-pay | Admitting: Physician Assistant

## 2016-05-12 VITALS — BP 120/60 | HR 75 | Ht <= 58 in | Wt 145.8 lb

## 2016-05-12 DIAGNOSIS — I2584 Coronary atherosclerosis due to calcified coronary lesion: Secondary | ICD-10-CM | POA: Diagnosis not present

## 2016-05-12 DIAGNOSIS — I480 Paroxysmal atrial fibrillation: Secondary | ICD-10-CM

## 2016-05-12 DIAGNOSIS — I2510 Atherosclerotic heart disease of native coronary artery without angina pectoris: Secondary | ICD-10-CM | POA: Diagnosis not present

## 2016-05-12 DIAGNOSIS — Q969 Turner's syndrome, unspecified: Secondary | ICD-10-CM

## 2016-05-12 DIAGNOSIS — I119 Hypertensive heart disease without heart failure: Secondary | ICD-10-CM

## 2016-05-12 DIAGNOSIS — I5032 Chronic diastolic (congestive) heart failure: Secondary | ICD-10-CM | POA: Diagnosis not present

## 2016-05-12 DIAGNOSIS — I351 Nonrheumatic aortic (valve) insufficiency: Secondary | ICD-10-CM

## 2016-05-12 NOTE — Patient Instructions (Signed)
Medication Instructions:  No changes.  Labwork: Today - BMET, CBC  Testing/Procedures: None   Follow-Up: Dr. Delane GingerPhil Nahser or Tereso NewcomerScott Urias Sheek, PA-C in 6 months.  Any Other Special Instructions Will Be Listed Below (If Applicable). You can hold the Eliquis for 2-3 days to allow your eye to heal. Please follow up with your eye doctor for your red eye.  If you need a refill on your cardiac medications before your next appointment, please call your pharmacy.

## 2016-05-12 NOTE — Progress Notes (Signed)
Cardiology Office Note:    Date:  05/12/2016   ID:  REEGAN Dean, DOB Feb 15, 1955, MRN [id number]  PCP:  Leamon Arnt, MD  Cardiologist:  Dr. Darlin Coco >> Dr. Liam Rogers   Electrophysiologist:  N/a Pulmonologist: Dr. Elsworth Soho   Referring MD: Leamon Arnt, MD   Chief Complaint  Patient presents with  . Atrial Fibrillation    follow up    History of Present Illness:    [name] is a 61 y.o. female with a hx of Turner's syndrome, mod AI, combined systolic and diastolic CHF, hypoxemia, sleep apnea, PAF, prior hyponatremia 2/2 total body Na depletion from diuretics.  She is on long term anticoagulation with Apixaban.  Echo in 8/16 demonstrated EF 45-50%. This was done in the setting of AF with RVR and interpretation was more difficult.  Last seen 8/17.  Echo in 10/17 demonstrated normal ejection fraction, mild diastolic dysfunction, mod AI.    She returns for Cardiology follow up.  She is here with her mother.  [name] is doing well.  She has occasional subconjunctival hemorrhage in her R eye.  She has chronic R Leg edema.  She denies chest pain, shortness of breath, syncope.  She denies bleeding issues.   Prior CV studies:   The following studies were reviewed today:  Chest CT 10/17 IMPRESSION: 1. 6 mm nodule at the periphery of the left lingula appears to have decreased slightly in size from the prior CT, and is likely benign. 2. Diffuse coronary artery calcifications seen.  Echo 10/17:  EF 60-65%, normal wall motion, grade 1 diastolic dysfunction, moderate AI, ascending aorta 35 mm, aortic root 30 mm  Echo 08/28/14 Mild LVH, EF 45-50%, diffuse HK, moderate AI, mild dilated ascending aorta, MAC, mild MR, severe LAE, mildly reduced RVSF, mild RAE, PASP 43 mmHg  Echo 10/13 EF 55-60%, normal wall motion, grade 2 diastolic dysfunction, mild aortic stenosis, moderate aortic insufficiency, moderate LAE, mild RAE  Past Medical History:  Diagnosis Date  . Aortic  insufficiency    a. Mod AI by echo 2013.//  b. Echo 10/17: EF 60-65%, normal wall motion, grade 1 diastolic dysfunction, moderate AI, ascending aorta 35 mm, aortic root 30 mm  . Aortic stenosis    a. Mild AS by echo 2013.  Marland Kitchen Cervix abnormality 1993   MILD ATYPICAL ADENOMATOUS HYPERPLASIA OF CERVIX  . Chronic diastolic CHF (congestive heart failure) ([place]) 08/29/2014  . Dermatitis 05-26-12   all over  since 9'13  . Difficult intubation    will plan on spinal anesthesia"small mouth/airway"  . Edema of both legs    a. right greater than left, due to Turner's syndrome.  Marland Kitchen Hypertension   . Hypothyroidism   . Periorbital edema   . Speech disorder    occ. "stutter"  . Turner's syndrome     Past Surgical History:  Procedure Laterality Date  . CERVICAL CONE BIOPSY  1993  . COLPOSCOPY    . DILATION AND CURETTAGE OF UTERUS  1993   CONE BIOPSY  . HERNIA REPAIR    . JOINT REPLACEMENT     hip replacement  . TOTAL HIP ARTHROPLASTY     LEFT  . TOTAL KNEE ARTHROPLASTY Right 05/31/2012   Procedure: RIGHT TOTAL KNEE ARTHROPLASTY;  Surgeon: Mauri Pole, MD;  Location: [facility];  Service: Orthopedics;  Laterality: Right;  . US ECHOCARDIOGRAPHY  06/20/2009   EF 55-60%    Current Medications: Current Meds  Medication Sig  . apixaban (  ELIQUIS) 5 MG TABS tablet Take 1 tablet (5 mg total) by mouth 2 (two) times daily.  . Cholecalciferol (VITAMIN D3) 2000 UNITS capsule Take 2,000 Units by mouth daily.  . famotidine (PEPCID) 40 MG tablet Take 40 mg by mouth 2 (two) times daily.  . furosemide (LASIX) 40 MG tablet Take 1 tablet (40 mg total) by mouth daily.  Marland Kitchen KLOR-CON M10 10 MEQ tablet TAKE ONE TABLET BY MOUTH TWICE DAILY  . levothyroxine (SYNTHROID, LEVOTHROID) 88 MCG tablet Take 88 mcg by mouth daily before breakfast.  . metoprolol tartrate (LOPRESSOR) 25 MG tablet Take 1 tablet (25 mg total) by mouth 2 (two) times daily.  Marland Kitchen spironolactone (ALDACTONE) 25 MG tablet Take 1 tablet (25 mg total) by  mouth 2 (two) times daily.  . traMADol (ULTRAM) 50 MG tablet Take 50 mg by mouth every 6 (six) hours as needed for moderate pain.      Allergies:   Cephalexin   Social History   Social History  . Marital status: Single    Spouse name: N/A  . Number of children: N/A  . Years of education: N/A   Occupational History  . N/A Not Employed   Social History Main Topics  . Smoking status: Never Smoker  . Smokeless tobacco: Never Used  . Alcohol use No  . Drug use: No  . Sexual activity: No   Other Topics Concern  . None   Social History Narrative  . None     Family History  Problem Relation Age of Onset  . Hypertension Mother   . Cancer Mother     SKIN CANCER  . Atrial fibrillation Mother   . Breast cancer Cousin     MATERNAL COUSIN  . Diabetes Maternal Uncle      ROS:   Please see the history of present illness.    Review of Systems  All other systems reviewed and are negative.  All other systems reviewed and are negative.   EKGs/Labs/Other Test Reviewed:    EKG:  EKG is  ordered today.  The ekg ordered today demonstrates NSR, HR 75, normal axis, QTc 442 ms, no changes  Recent Labs: 09/02/2015: BUN 22; Creat 0.85; Hemoglobin 14.8; Platelets 337; Potassium 4.5; Sodium 134   Recent Lipid Panel    Component Value Date/Time   CHOL 163 08/29/2014 0800   TRIG 163 (H) 08/29/2014 0800   HDL 26 (L) 08/29/2014 0800   CHOLHDL 6.3 08/29/2014 0800   VLDL 33 08/29/2014 0800   LDLCALC 104 (H) 08/29/2014 0800     Physical Exam:    VS:  BP 120/60   Pulse 75   Ht _0  (1.422 m)   Wt 145 lb 12.8 oz (66.1 kg)   SpO2 93%   BMI 32.69 kg/m     Wt Readings from Last 3 Encounters:  05/12/16 145 lb 12.8 oz (66.1 kg)  10/15/15 149 lb (67.6 kg)  09/02/15 148 lb 1.9 oz (67.2 kg)     Physical Exam  Constitutional: She is oriented to person, place, and time. She appears well-developed and well-nourished.  HENT:  Head: Atraumatic.  Eyes: Right conjunctiva has a  hemorrhage.  Neck: No JVD (I cannot detect JVD) present.  Webbed neck c/w Turner Syndrome  Cardiovascular: Normal rate and regular rhythm.   No murmur (I cannot appreciate AI murmur today) heard. Pulmonary/Chest: She has no wheezes. She has no rales.  Abdominal: Soft.  Musculoskeletal: She exhibits edema (trace R ankle edema).  Neurological: She  is alert and oriented to person, place, and time.  Skin: Skin is warm and dry.  Psychiatric: She has a normal mood and affect.    ASSESSMENT:    1. PAF (paroxysmal atrial fibrillation) ([place])   2. Aortic valve insufficiency, etiology of cardiac valve disease unspecified   3. Chronic diastolic CHF (congestive heart failure) ([place])   4. Benign hypertensive heart disease without heart failure   5. Turner's syndrome   6. Coronary artery calcification    PLAN:    In order of problems listed above:  1. PAF (paroxysmal atrial fibrillation) (HCC) -  Maintaining NSR.  Continue Eliquis.  Check BMET, CBC today.  It is ok for her to hold her Eliquis for 2-3 days to allow her subconjunctival hemorrhage on the R to heal.  2. Aortic valve insufficiency, etiology of cardiac valve disease unspecified -  Mod by Echo in 10/17. Overall stable.  Consider repeat Echo in 1-2 years.  3. Chronic diastolic CHF (congestive heart failure) (HCC) -  Volume stable.  Continue current Rx.   4. Benign hypertensive heart disease without heart failure - BP controlled.   5. Turner's syndrome - Echo in 8/16 with ascending aorta 38.15 mm.  Echo in 10/17 with ascending aorta 35 mm.  Chest CTA in 2016 without evidence of aneurysm.  6. Coronary artery calcification - No ASA as she is on Eliquis.  FU with PCP whom manages her cholesterol.  Consider statin Rx if LDL > 100.  Dispo:  Return in about 6 months (around 11/12/2016) for Routine Follow Up, w/ Dr. Acie Fredrickson or Richardson Dopp, PA-C.   Medication Adjustments/Labs and Tests Ordered: Current medicines are reviewed at length  with the patient today.  Concerns regarding medicines are outlined above.  Orders/Tests:  Orders Placed This Encounter  Procedures  . Basic metabolic panel  . CBC with Differential/Platelet  . EKG 12-Lead   Medication changes: No orders of the defined types were placed in this encounter.  Signed, Richardson Dopp, PA-C  05/12/2016 4:34 PM    [facility] [address], [place], [place]  [address] Phone: [phone]; Fax: [phone]

## 2016-05-13 LAB — CBC WITH DIFFERENTIAL/PLATELET
BASOS: 1 %
Basophils Absolute: 0.1 10*3/uL (ref 0.0–0.2)
EOS (ABSOLUTE): 0.1 10*3/uL (ref 0.0–0.4)
EOS: 1 %
HEMOGLOBIN: 14.4 g/dL (ref 11.1–15.9)
Hematocrit: 41.1 % (ref 34.0–46.6)
IMMATURE GRANS (ABS): 0.1 10*3/uL (ref 0.0–0.1)
Immature Granulocytes: 1 %
LYMPHS: 17 %
Lymphocytes Absolute: 1.8 10*3/uL (ref 0.7–3.1)
MCH: 33.7 pg — ABNORMAL HIGH (ref 26.6–33.0)
MCHC: 35 g/dL (ref 31.5–35.7)
MCV: 96 fL (ref 79–97)
MONOCYTES: 15 %
Monocytes Absolute: 1.6 10*3/uL — ABNORMAL HIGH (ref 0.1–0.9)
NEUTROS ABS: 7.2 10*3/uL — AB (ref 1.4–7.0)
Neutrophils: 65 %
Platelets: 337 10*3/uL (ref 150–379)
RBC: 4.27 x10E6/uL (ref 3.77–5.28)
RDW: 13 % (ref 12.3–15.4)
WBC: 10.7 10*3/uL (ref 3.4–10.8)

## 2016-05-13 LAB — BASIC METABOLIC PANEL
BUN / CREAT RATIO: 26 (ref 12–28)
BUN: 23 mg/dL (ref 8–27)
CHLORIDE: 92 mmol/L — AB (ref 96–106)
CO2: 25 mmol/L (ref 18–29)
Calcium: 9.6 mg/dL (ref 8.7–10.3)
Creatinine, Ser: 0.89 mg/dL (ref 0.57–1.00)
GFR calc Af Amer: 81 mL/min/{1.73_m2} (ref >59)
GFR calc non Af Amer: 70 mL/min/{1.73_m2} (ref >59)
GLUCOSE: 91 mg/dL (ref 65–99)
POTASSIUM: 4 mmol/L (ref 3.5–5.2)
SODIUM: 136 mmol/L (ref 134–144)

## 2016-05-19 ENCOUNTER — Telehealth: Payer: Self-pay | Admitting: Pulmonary Disease

## 2016-05-19 NOTE — Telephone Encounter (Signed)
Spoke with pt's mother [name] (dpr on file), asking questions about pt's bipap titration study scheduled for Friday.  Questions were answered.  Nothing further needed.

## 2016-05-22 ENCOUNTER — Encounter (HOSPITAL_BASED_OUTPATIENT_CLINIC_OR_DEPARTMENT_OTHER): Payer: Medicaid Other

## 2016-06-18 ENCOUNTER — Ambulatory Visit (HOSPITAL_BASED_OUTPATIENT_CLINIC_OR_DEPARTMENT_OTHER): Payer: Medicaid Other | Attending: Adult Health | Admitting: Pulmonary Disease

## 2016-06-18 DIAGNOSIS — G4733 Obstructive sleep apnea (adult) (pediatric): Secondary | ICD-10-CM | POA: Insufficient documentation

## 2016-07-02 ENCOUNTER — Telehealth: Payer: Self-pay | Admitting: Pulmonary Disease

## 2016-07-02 DIAGNOSIS — G4733 Obstructive sleep apnea (adult) (pediatric): Secondary | ICD-10-CM

## 2016-07-02 DIAGNOSIS — G4730 Sleep apnea, unspecified: Secondary | ICD-10-CM | POA: Diagnosis not present

## 2016-07-02 NOTE — Procedures (Signed)
Patient Name: [name], [name] Study Date: 06/18/2016 Gender: Female D.O.B: Jan 14, 1955 Age (years): [age] Referring Provider: Babette Relicammy Parrett Height (inches): 58 Interpreting Physician: Cyril Mourningakesh Clementina Mareno MD, ABSM Weight (lbs): 143 RPSGT: Cherylann ParrDubili, Fred BMI: 30 MRN: [id number] Neck Size: 16.00 CLINICAL INFORMATION The patient is referred for a BiPAP titration to treat sleep apnea.  She has Turner's syndrome and persistent events on review of BiPAP downloads. Hence repeat BiPAP titration was requested  SLEEP STUDY TECHNIQUE As per the AASM Manual for the Scoring of Sleep and Associated Events v2.3 (April 2016) with a hypopnea requiring 4% desaturations.  The channels recorded and monitored were frontal, central and occipital EEG, electrooculogram (EOG), submentalis EMG (chin), nasal and oral airflow, thoracic and abdominal wall motion, anterior tibialis EMG, snore microphone, electrocardiogram, and pulse oximetry. Bilevel positive airway pressure (BPAP) was initiated at the beginning of the study and titrated to treat sleep-disordered breathing.  RESPIRATORY PARAMETERS Optimal IPAP Pressure (cm): 21 AHI at Optimal Pressure (/hr) 0.0 Optimal EPAP Pressure (cm): 17   Overall Minimal O2 (%): 88.00 Minimal O2 at Optimal Pressure (%): 93.0   SLEEP ARCHITECTURE Start Time: 10:14:50 PM Stop Time: 5:23:18 AM Total Time (min): 428.5 Total Sleep Time (min): 224.7 Sleep Latency (min): 17.8 Sleep Efficiency (%): 52.4 REM Latency (min): 384.0 WASO (min): 186.0 Stage N1 (%): 9.57 Stage N2 (%): 80.64 Stage N3 (%): 0.00 Stage R (%): 9.79 Supine (%): 51.63 Arousal Index (/hr): 25.6       CARDIAC DATA The 2 lead EKG demonstrated sinus rhythm. The mean heart rate was 58.30 beats per minute. Other EKG findings include: None.   LEG MOVEMENT DATA The total Periodic Limb Movements of Sleep (PLMS) were 64. The PLMS index was 17.09. A PLMS index of <15 is considered normal in adults.  IMPRESSIONS - An optimal PAP  pressure was selected for this patient ( 21 / 17 cm of water) - Severe Central Sleep Apnea was noted during this titration (CAI = 36.3/h). - Severe oxygen desaturations were observed during this titration (min O2 = 88.00%). - No snoring was audible during this study. - No cardiac abnormalities were observed during this study. - Mild periodic limb movements were observed during this study. Arousals associated with PLMs were rare.   DIAGNOSIS - Obstructive Sleep Apnea (327.23 [G47.33 ICD-10])   RECOMMENDATIONS - Trial of BiPAP therapy on 21/17 cm H2O with a Small size Resmed Full Face Mask AirFit F20 mask and heated humidification. - Avoid alcohol, sedatives and other CNS depressants that may worsen sleep apnea and disrupt normal sleep architecture. - Sleep hygiene should be reviewed to assess factors that may improve sleep quality. - Weight management and regular exercise should be initiated or continued. - Return to Sleep Center for re-evaluation after 4 weeks of therapy   Cyril Mourningakesh Nyan Dufresne MD Board Certified in Sleep medicine

## 2016-07-02 NOTE — Telephone Encounter (Signed)
lmtcb X1 for pt/mother.  Will place order after speaking to pt's mother.

## 2016-07-02 NOTE — Telephone Encounter (Signed)
Please change her BiPAP settings to 21/17 based on sleep study

## 2016-07-03 NOTE — Telephone Encounter (Signed)
Spoke with pt and mother, aware of pressure changes.  Order placed to change pressure.  Nothing further needed.

## 2016-07-03 NOTE — Telephone Encounter (Signed)
Pt mother returning call and can be reached @ [phone] joan Prettyman.Caren GriffinsStanley A Dalton

## 2016-07-06 ENCOUNTER — Telehealth: Payer: Self-pay | Admitting: Pulmonary Disease

## 2016-07-06 NOTE — Telephone Encounter (Signed)
Spoke with patient's mother regarding results. She verbalized understanding. Nothing further needed.

## 2016-07-06 NOTE — Telephone Encounter (Signed)
Study showed events controlled on BiPAP settings to 21/17

## 2016-07-06 NOTE — Telephone Encounter (Signed)
Spoke with pt's mother, Chyrl CivatteJoann. She is requesting pt's sleep study results.  RA - please advise. Thanks.

## 2016-07-10 NOTE — Progress Notes (Signed)
Spoke with patient and informed her of setting change. An appointment was also made for 10/09/16 at 330pm with RA. Pt verbalized understanding and did not have any questions. Nothing further is needed.

## 2016-08-18 ENCOUNTER — Other Ambulatory Visit: Payer: Self-pay | Admitting: Cardiovascular Disease

## 2016-08-25 ENCOUNTER — Other Ambulatory Visit: Payer: Self-pay | Admitting: Cardiovascular Disease

## 2016-08-25 ENCOUNTER — Other Ambulatory Visit: Payer: Self-pay | Admitting: Physician Assistant

## 2016-10-08 ENCOUNTER — Encounter: Payer: Self-pay | Admitting: Pulmonary Disease

## 2016-10-09 ENCOUNTER — Ambulatory Visit (INDEPENDENT_AMBULATORY_CARE_PROVIDER_SITE_OTHER): Payer: Medicaid Other | Admitting: Pulmonary Disease

## 2016-10-09 ENCOUNTER — Encounter: Payer: Self-pay | Admitting: Pulmonary Disease

## 2016-10-09 DIAGNOSIS — Z23 Encounter for immunization: Secondary | ICD-10-CM

## 2016-10-09 DIAGNOSIS — R911 Solitary pulmonary nodule: Secondary | ICD-10-CM | POA: Diagnosis not present

## 2016-10-09 DIAGNOSIS — G4733 Obstructive sleep apnea (adult) (pediatric): Secondary | ICD-10-CM

## 2016-10-09 NOTE — Patient Instructions (Signed)
Flu shot BiPAP is working well  -try to obtain a better seal on  your mask

## 2016-10-09 NOTE — Progress Notes (Signed)
   Subjective:    Patient ID: [name], female    DOB: [birth year], 61 y.o.   MRN: [id number]  HPI   73 yowf never smoker with Turner's syndrome\ & severe OSA   -dc'd on bipap and 02 3lpm p admit 08/2014 due to hypercarbic resp failure & CHF/ atrial fibn. She has systolic CHF with EF 45%  She was taken off oxygen in 2017 and seems to have tolerated this well. She continued to have residual events on BiPAP and underwent a repeat titration study and 06/2016, settings were adjusted to 21/17 and since then she seems to be doing much better. Mom has not noticed snoring, notices occasional leak on her mask. She denies problems with mask or pressure, seems to be tolerating high pressure, denies dryness of mouth or nasal congestion.  Download on 21/17 shows usage daily with average about 4 hours with no residuals and large leak  He also reviewed prior imaging studies which have shown stable lingular nodule    Significant tests/ events  NPSG 08/29/12:  Severe, with an AHI of 79  Bipap titration 08/2014-optimal 25/20 with small fullface mask  - Autobipap  download 12/2014 AHI 20/h, leak ++, avg 18/13, excellent usage  06/2016 bipap 21/17  CT chest 08/2014  >>7 mm lingular nodule, Slight decrease in follow-up 10/2015  Review of Systems neg for any significant sore throat, dysphagia, itching, sneezing, nasal congestion or excess/ purulent secretions, fever, chills, sweats, unintended wt loss, pleuritic or exertional cp, hempoptysis, orthopnea pnd or change in chronic leg swelling. Also denies presyncope, palpitations, heartburn, abdominal pain, nausea, vomiting, diarrhea or change in bowel or urinary habits, dysuria,hematuria, rash, arthralgias, visual complaints, headache, numbness weakness or ataxia.     Objective:   Physical Exam   Gen. Pleasant, obese, in no distress ENT - class 2 airway, no post nasal drip Neck: No JVD, no thyromegaly, no carotid bruits Lungs: no use of  accessory muscles, no dullness to percussion, decreased without rales or rhonchi  Cardiovascular: Rhythm regular, heart sounds  normal, no murmurs or gallops, no peripheral edema Musculoskeletal: No deformities, no cyanosis or clubbing , no tremors Neuro - halting speech        Assessment & Plan:

## 2016-10-09 NOTE — Assessment & Plan Note (Signed)
Current settings of BiPAP 21/17 seems to work well with no residuals noted on download. She continues to mask leak and probably needs a better seal. She seems to be tolerating the higher pressure okay and has had no intercurrent hospitalizations for respiratory failure  Weight loss encouraged, compliance with goal of at least 4-6 hrs every night is the expectation. Advised against medications with sedative side effects Cautioned against driving when sleepy - understanding that sleepiness will vary on a day to day basis

## 2016-10-09 NOTE — Assessment & Plan Note (Signed)
Stable, no follow up required

## 2016-11-11 ENCOUNTER — Ambulatory Visit: Payer: Self-pay | Admitting: Cardiovascular Disease

## 2016-11-12 ENCOUNTER — Encounter: Payer: Self-pay | Admitting: Cardiovascular Disease

## 2016-11-12 ENCOUNTER — Ambulatory Visit: Payer: Medicaid Other | Admitting: Cardiovascular Disease

## 2016-11-12 VITALS — BP 120/70 | HR 70 | Ht <= 58 in | Wt 143.2 lb

## 2016-11-12 DIAGNOSIS — Q969 Turner's syndrome, unspecified: Secondary | ICD-10-CM

## 2016-11-12 DIAGNOSIS — I351 Nonrheumatic aortic (valve) insufficiency: Secondary | ICD-10-CM | POA: Diagnosis not present

## 2016-11-12 DIAGNOSIS — I480 Paroxysmal atrial fibrillation: Secondary | ICD-10-CM | POA: Diagnosis not present

## 2016-11-12 NOTE — Progress Notes (Signed)
Cardiology Office Note:    Date:  11/12/2016   ID:  [name], DOB 1955-08-21, MRN [id number]  PCP:  Willow OraAndy, Camille L, MD  Cardiologist:  Kristeen MissPhilip Vasti Yagi, MD    Referring MD: Willow OraAndy, Camille L, MD   Chief Complaint  Patient presents with  . Atrial Fibrillation    History of Present Illness:    [name] is a 61 y.o. female with a hx of atrial fib  [name] is a 61 y.o. female who is being seen today for the evaluation of paroxysmal atrial fib  at the request of Willow OraAndy, Camille L, MD.  Hx of Turners syndrome, mod AI, chronic diastolic CHF, OSA , PAF  On Eliquis  Cannot really tell when she is in Afib   Was seen by Ryder SystemScott weaver in Aug, 2017.   No cough , cold symptoms   Past Medical History:  Diagnosis Date  . Aortic insufficiency    a. Mod AI by echo 2013.//  b. Echo 10/17: EF 60-65%, normal wall motion, grade 1 diastolic dysfunction, moderate AI, ascending aorta 35 mm, aortic root 30 mm  . Aortic stenosis    a. Mild AS by echo 2013.  Marland Kitchen. Cervix abnormality 1993   MILD ATYPICAL ADENOMATOUS HYPERPLASIA OF CERVIX  . Chronic diastolic CHF (congestive heart failure) (HCC) 08/29/2014  . Dermatitis 05-26-12   all over  since 9'13  . Difficult intubation    will plan on spinal anesthesia"small mouth/airway"  . Edema of both legs    a. right greater than left, due to Turner's syndrome.  Marland Kitchen. Hypertension   . Hypothyroidism   . Periorbital edema   . Speech disorder    occ. "stutter"  . Turner's syndrome     Past Surgical History:  Procedure Laterality Date  . CERVICAL CONE BIOPSY  1993  . COLPOSCOPY    . DILATION AND CURETTAGE OF UTERUS  1993   CONE BIOPSY  . HERNIA REPAIR    . JOINT REPLACEMENT     hip replacement  . TOTAL HIP ARTHROPLASTY     LEFT  . US ECHOCARDIOGRAPHY  06/20/2009   EF 55-60%    Current Medications: Current Meds  Medication Sig  . apixaban (ELIQUIS) 5 MG TABS tablet TAKE 1 TABLET BY MOUTH TWICE DAILY  . Cholecalciferol (VITAMIN D3)  2000 UNITS capsule Take 2,000 Units by mouth daily.  Marland Kitchen. ELIQUIS 5 MG TABS tablet TAKE 1 TABLET BY MOUTH TWICE DAILY  . famotidine (PEPCID) 40 MG tablet Take 40 mg by mouth 2 (two) times daily.  . famotidine (PEPCID) 40 MG tablet Take by mouth.  . furosemide (LASIX) 20 MG tablet TAKE 2 TABLETS BY MOUTH DAILY AND 1 TABLET EACH EVENING  . KLOR-CON M10 10 MEQ tablet TAKE ONE TABLET BY MOUTH TWICE DAILY  . levothyroxine (SYNTHROID, LEVOTHROID) 88 MCG tablet TAKE ONE TABLET BY MOUTH ONCE DAILY  . metoprolol tartrate (LOPRESSOR) 25 MG tablet TAKE ONE TABLET BY MOUTH TWICE DAILY  . metoprolol tartrate (LOPRESSOR) 25 MG tablet TAKE ONE TABLET BY MOUTH TWICE DAILY  . spironolactone (ALDACTONE) 25 MG tablet Take 1 tablet (25 mg total) by mouth 2 (two) times daily.  . traMADol (ULTRAM) 50 MG tablet Take 50 mg by mouth every 6 (six) hours as needed for moderate pain.      Allergies:   Cephalexin   Social History   Socioeconomic History  . Marital status: Single    Spouse name: None  . Number of children:  None  . Years of education: None  . Highest education level: None  Social Needs  . Financial resource strain: None  . Food insecurity - worry: None  . Food insecurity - inability: None  . Transportation needs - medical: None  . Transportation needs - non-medical: None  Occupational History  . Occupation: N/A    Employer: NOT EMPLOYED  Tobacco Use  . Smoking status: Never Smoker  . Smokeless tobacco: Never Used  Substance and Sexual Activity  . Alcohol use: No  . Drug use: No  . Sexual activity: No    Birth control/protection: Post-menopausal  Other Topics Concern  . None  Social History Narrative  . None     Family History: The patient's family history includes Atrial fibrillation in her mother; Breast cancer in her cousin; Cancer in her mother; Diabetes in her maternal uncle; Hypertension in her mother. ROS:   Please see the history of present illness.     All other systems  reviewed and are negative.  EKGs/Labs/Other Studies Reviewed:    The following studies were reviewed today:   EKG:     Recent Labs: 05/12/2016: BUN 23; Creatinine, Ser 0.89; Hemoglobin 14.4; Platelets 337; Potassium 4.0; Sodium 136  Recent Lipid Panel    Component Value Date/Time   CHOL 163 08/29/2014 0800   TRIG 163 (H) 08/29/2014 0800   HDL 26 (L) 08/29/2014 0800   CHOLHDL 6.3 08/29/2014 0800   VLDL 33 08/29/2014 0800   LDLCALC 104 (H) 08/29/2014 0800    Physical Exam:    VS:  BP 120/70   Pulse 70   Ht 4\' 8"  (1.422 m)   Wt 143 lb 3.2 oz (65 kg)   SpO2 97%   BMI 32.10 kg/m     Wt Readings from Last 3 Encounters:  11/12/16 143 lb 3.2 oz (65 kg)  10/09/16 142 lb 3.2 oz (64.5 kg)  06/18/16 143 lb (64.9 kg)     GEN: middle age female, short stature, Turners syndrome HEENT: Normal NECK: No JVD; No carotid bruits LYMPHATICS: No lymphadenopathy CARDIAC:  RR, soft diastolic murmur  RESPIRATORY:  Clear to auscultation without rales, wheezing or rhonchi  ABDOMEN: Soft, non-tender, non-distended MUSCULOSKELETAL:  No edema; No deformity  SKIN: Warm and dry NEUROLOGIC:  Alert and oriented x 3 PSYCHIATRIC:  Normal affect   ASSESSMENT:    No diagnosis found. PLAN:    In order of problems listed above:  1. Turners Syndrome:      [name] has moderate aortic insufficiency .   Stable   2.  Paroxysmal atrial fib:  Remains in NSR.  On eliquis .  She is asymptomatic . Cannot tell when she is in AF .  Continue to monitor .    Medication Adjustments/Labs and Tests Ordered: Current medicines are reviewed at length with the patient today.  Concerns regarding medicines are outlined above.  No orders of the defined types were placed in this encounter.  No orders of the defined types were placed in this encounter.   Signed, Kristeen MissPhilip Sherol Sabas, MD  11/12/2016 2:06 PM    [facility]

## 2016-11-12 NOTE — Patient Instructions (Signed)
Medication Instructions:  Your physician recommends that you continue on your current medications as directed. Please refer to the Current Medication list given to you today.   Labwork: TODAY - basic metabolic panel   Testing/Procedures: None Ordered   Follow-Up: Your physician wants you to follow-up in: 6 months with Dr. Nahser. You will receive a reminder letter in the mail two months in advance. If you don't receive a letter, please call our office to schedule the follow-up appointment.   If you need a refill on your cardiac medications before your next appointment, please call your pharmacy.   Thank you for choosing [facility]! Jahrell Hamor, RN [phone]    

## 2016-11-13 LAB — BASIC METABOLIC PANEL
BUN/Creatinine Ratio: 25 (ref 12–28)
BUN: 22 mg/dL (ref 8–27)
CALCIUM: 10 mg/dL (ref 8.7–10.3)
CO2: 26 mmol/L (ref 20–29)
CREATININE: 0.87 mg/dL (ref 0.57–1.00)
Chloride: 94 mmol/L — ABNORMAL LOW (ref 96–106)
GFR calc Af Amer: 83 mL/min/{1.73_m2} (ref >59)
GFR, EST NON AFRICAN AMERICAN: 72 mL/min/{1.73_m2} (ref >59)
Glucose: 89 mg/dL (ref 65–99)
Potassium: 4.4 mmol/L (ref 3.5–5.2)
Sodium: 136 mmol/L (ref 134–144)

## 2016-12-10 ENCOUNTER — Other Ambulatory Visit: Payer: Self-pay | Admitting: Cardiovascular Disease

## 2016-12-10 MED ORDER — FUROSEMIDE 20 MG PO TABS: ORAL_TABLET | ORAL | 11 refills | Status: DC

## 2017-01-18 ENCOUNTER — Other Ambulatory Visit: Payer: Self-pay | Admitting: Cardiovascular Disease

## 2017-01-18 MED ORDER — POTASSIUM CHLORIDE CRYS ER 10 MEQ PO TBCR: 10.0000 meq | EXTENDED_RELEASE_TABLET | Freq: Two times a day (BID) | ORAL | 11 refills | Status: DC

## 2017-01-18 NOTE — Telephone Encounter (Signed)
Pt's medication was sent to pt's pharmacy as requested. Confirmation received.  °

## 2017-01-19 ENCOUNTER — Telehealth: Payer: Self-pay | Admitting: Cardiovascular Disease

## 2017-01-19 NOTE — Telephone Encounter (Signed)
New Message  Pt call requesting to speak with RN. She states she was prescribed spironolactone by Dr. Mardelle MatteAndy and she no longer sees this provider. Pt would like to know if Dr. Elease HashimotoNahser would be able to prescribe this medication to her. [facility]   *STAT* If patient is at the pharmacy, call can be transferred to refill team.   1. Which medications need to be refilled? (please list name of each medication and dose if known) spironolactone 25mg   2. Which pharmacy/location (including street and city if local pharmacy) is medication to be sent to? [facility] [address]   3. Do they need a 30 day or 90 day supply? 90

## 2017-01-19 NOTE — Telephone Encounter (Signed)
OK to prescribe Aldactone 25 mg BID

## 2017-01-19 NOTE — Telephone Encounter (Signed)
Looks like Spironolactone was prescribed by Dr. Sherene SiresWert in the past. Routing to Dr. Elease HashimotoNahser for advice.

## 2017-01-20 MED ORDER — SPIRONOLACTONE 25 MG PO TABS: 25.0000 mg | ORAL_TABLET | Freq: Two times a day (BID) | ORAL | 11 refills | Status: DC

## 2017-01-20 NOTE — Telephone Encounter (Signed)
Spironolactone Rx refilled.

## 2017-03-10 ENCOUNTER — Encounter: Payer: Self-pay | Admitting: Physician Assistant

## 2017-03-10 ENCOUNTER — Telehealth (HOSPITAL_COMMUNITY): Payer: Self-pay | Admitting: Nurse Practitioner

## 2017-03-10 NOTE — Telephone Encounter (Signed)
03/10/2017 03:45 PM Phone (Outgoing) [name], [name] (Self) [phone] (H) Remove  Completed - Called pt and spoke with Mother [name] who was very rude and did not understand why Donna FiscalLori needed the appt if she already has a Development worker, international aidCardiologist. I informed her this test had nothing to do with her current cardiologist and this was ordered by her PCP [name]    By Trina AoGriffin, Soriyah Osberg A    03/10/2017 03:51 PM Phone (Outgoing) [name] (Provider) [phone] Remove  Completed - I called and spoke with [name] and made her aware ofthe conversation that I had with the patient's mother and she will send a note to the nurse to inform her.     By Elita BooneGriffin, Brynli Ollis A

## 2017-03-17 ENCOUNTER — Other Ambulatory Visit: Payer: Self-pay | Admitting: Nurse Practitioner

## 2017-03-17 ENCOUNTER — Encounter: Payer: Self-pay | Admitting: Physician Assistant

## 2017-03-17 ENCOUNTER — Telehealth: Payer: Self-pay

## 2017-03-17 ENCOUNTER — Ambulatory Visit (INDEPENDENT_AMBULATORY_CARE_PROVIDER_SITE_OTHER): Payer: Medicaid Other | Admitting: Physician Assistant

## 2017-03-17 VITALS — BP 120/74 | HR 69 | Ht <= 58 in | Wt 132.8 lb

## 2017-03-17 DIAGNOSIS — I351 Nonrheumatic aortic (valve) insufficiency: Secondary | ICD-10-CM

## 2017-03-17 DIAGNOSIS — I119 Hypertensive heart disease without heart failure: Secondary | ICD-10-CM | POA: Diagnosis not present

## 2017-03-17 DIAGNOSIS — I2584 Coronary atherosclerosis due to calcified coronary lesion: Secondary | ICD-10-CM

## 2017-03-17 DIAGNOSIS — Z1231 Encounter for screening mammogram for malignant neoplasm of breast: Secondary | ICD-10-CM

## 2017-03-17 DIAGNOSIS — I480 Paroxysmal atrial fibrillation: Secondary | ICD-10-CM

## 2017-03-17 DIAGNOSIS — I2510 Atherosclerotic heart disease of native coronary artery without angina pectoris: Secondary | ICD-10-CM

## 2017-03-17 DIAGNOSIS — Q969 Turner's syndrome, unspecified: Secondary | ICD-10-CM | POA: Diagnosis not present

## 2017-03-17 DIAGNOSIS — I5032 Chronic diastolic (congestive) heart failure: Secondary | ICD-10-CM

## 2017-03-17 MED ORDER — ATORVASTATIN CALCIUM 20 MG PO TABS: 20.0000 mg | ORAL_TABLET | Freq: Every day | ORAL | 11 refills | Status: AC

## 2017-03-17 NOTE — Telephone Encounter (Signed)
Patient was in the office when the computer system crashed. She requested to leave without her paperwork and to be called tomorrow with instructions. 1) start atorvastatin 20 mg (this has been called into her pharmacy) 2) schedule fasting blood work and echo on the same day in 6-8 weeks 3) 6 mo recall for Dr. Elease HashimotoNahser has already been placed  She was informed once her appointments were scheduled her AVS would be printed and mailed.   Will call tomorrow to review.

## 2017-03-17 NOTE — Progress Notes (Signed)
Cardiology Office Note:    Date:  03/17/2017   ID:  [name], DOB 1955/05/15, MRN [id number]  PCP:  Bernerd Limbo, MD  Cardiologist:  Mertie Moores, MD  Pulmonologist: Dr. Elsworth Soho  Referring MD: Bernerd Limbo, MD   Chief Complaint  Patient presents with  . Follow-up    Afib, aortic insufficiency, CHF    History of Present Illness:    [name] is a 62 y.o. female with Turner's syndrome, mod AI, diastolic CHF, hypoxemia, sleep apnea, PAF, prior hyponatremia 2/2 total body Na depletion from diuretics. She is on long term anticoagulation with Apixaban. Echo in 8/16 demonstrated EF 45-50%. This was done in the setting of AF with RVR and interpretation was more difficult.  Last seen 8/17.  Echo in 10/17 demonstrated normal ejection fraction, mild diastolic dysfunction, mod AI.    Last seen by Dr. Acie Fredrickson November 2018.  [name] returns for follow up.  She is here with her mother.  She denies chest pain, significant shortness of breath, syncope, paroxysmal nocturnal dyspnea, bleeding issues.  She has chronic leg swelling without significant change.    Prior CV studies:   The following studies were reviewed today:  Chest CT 10/17 IMPRESSION: 1. 6 mm nodule at the periphery of the left lingula appears to have decreased slightly in size from the prior CT, and is likely benign. 2. Diffuse coronary artery calcifications seen.  Echo 10/17:  EF 60-65%, normal wall motion, grade 1 diastolic dysfunction, moderate AI, ascending aorta 35 mm, aortic root 30 mm  Echo 08/28/14 Mild LVH, EF 45-50%, diffuse HK, moderate AI, mild dilated ascending aorta, MAC, mild MR, severe LAE, mildly reduced RVSF, mild RAE, PASP 43 mmHg  Echo 10/13 EF 55-60%, normal wall motion, grade 2 diastolic dysfunction, mild aortic stenosis, moderate aortic insufficiency, moderate LAE, mild RAE  Past Medical History:  Diagnosis Date  . Aortic insufficiency    a. Mod AI by echo 2013.//  b. Echo 10/17: EF  60-65%, normal wall motion, grade 1 diastolic dysfunction, moderate AI, ascending aorta 35 mm, aortic root 30 mm  . Aortic stenosis    a. Mild AS by echo 2013.  Marland Kitchen Cervix abnormality 1993   MILD ATYPICAL ADENOMATOUS HYPERPLASIA OF CERVIX  . Chronic diastolic CHF (congestive heart failure) ([facility]) 08/29/2014  . Dermatitis 05-26-12   all over  since 9'13  . Difficult intubation    will plan on spinal anesthesia"small mouth/airway"  . Edema of both legs    a. right greater than left, due to Turner's syndrome.  Marland Kitchen Hypertension   . Hypothyroidism   . Periorbital edema   . Speech disorder    occ. "stutter"  . Turner's syndrome     Past Surgical History:  Procedure Laterality Date  . CERVICAL CONE BIOPSY  1993  . COLPOSCOPY    . DILATION AND CURETTAGE OF UTERUS  1993   CONE BIOPSY  . HERNIA REPAIR    . JOINT REPLACEMENT     hip replacement  . TOTAL HIP ARTHROPLASTY     LEFT  . TOTAL KNEE ARTHROPLASTY Right 05/31/2012   Procedure: RIGHT TOTAL KNEE ARTHROPLASTY;  Surgeon: Mauri Pole, MD;  Location: [facility];  Service: Orthopedics;  Laterality: Right;  . US ECHOCARDIOGRAPHY  06/20/2009   EF 55-60%    Current Medications: Current Meds  Medication Sig  . apixaban (ELIQUIS) 5 MG TABS tablet TAKE 1 TABLET BY MOUTH TWICE DAILY  . Cholecalciferol (VITAMIN D3) 2000 UNITS capsule Take  2,000 Units by mouth daily.  . famotidine (PEPCID) 40 MG tablet Take 40 mg by mouth 2 (two) times daily.  . furosemide (LASIX) 20 MG tablet TAKE 2 TABLETS BY MOUTH DAILY AND 1 TABLET EACH EVENING  . levothyroxine (SYNTHROID, LEVOTHROID) 88 MCG tablet TAKE ONE TABLET BY MOUTH ONCE DAILY  . metoprolol tartrate (LOPRESSOR) 25 MG tablet TAKE ONE TABLET BY MOUTH TWICE DAILY  . potassium chloride (KLOR-CON M10) 10 MEQ tablet Take 1 tablet (10 mEq total) by mouth 2 (two) times daily.  Marland Kitchen spironolactone (ALDACTONE) 25 MG tablet Take 1 tablet (25 mg total) by mouth 2 (two) times daily.  . traMADol (ULTRAM) 50 MG tablet  Take 50 mg by mouth every 6 (six) hours as needed for moderate pain.      Allergies:   Cephalexin   Social History   Tobacco Use  . Smoking status: Never Smoker  . Smokeless tobacco: Never Used  Substance Use Topics  . Alcohol use: No  . Drug use: No     Family Hx: The patient's family history includes Atrial fibrillation in her mother; Breast cancer in her cousin; Cancer in her mother; Diabetes in her maternal uncle; Hypertension in her mother.  ROS:   Please see the history of present illness.    ROS All other systems reviewed and are negative.   EKGs/Labs/Other Test Reviewed:    EKG:  EKG is  ordered today.  The ekg ordered today demonstrates normal sinus rhythm, HR 69, normal axis, QTc 447 ms, no changes.   Recent Labs: 05/12/2016: Hemoglobin 14.4; Platelets 337 11/12/2016: BUN 22; Creatinine, Ser 0.87; Potassium 4.4; Sodium 136   Recent Lipid Panel Lab Results  Component Value Date/Time   CHOL 163 08/29/2014 08:00 AM   TRIG 163 (H) 08/29/2014 08:00 AM   HDL 26 (L) 08/29/2014 08:00 AM   CHOLHDL 6.3 08/29/2014 08:00 AM   LDLCALC 104 (H) 08/29/2014 08:00 AM    Physical Exam:    VS:  BP 120/74   Pulse 69   Ht _0  (1.422 m)   Wt 132 lb 12.8 oz (60.2 kg)   SpO2 97%   BMI 29.77 kg/m     Wt Readings from Last 3 Encounters:  03/17/17 132 lb 12.8 oz (60.2 kg)  11/12/16 143 lb 3.2 oz (65 kg)  10/09/16 142 lb 3.2 oz (64.5 kg)     Physical Exam  Constitutional: She is oriented to person, place, and time. She appears well-developed and well-nourished. No distress.  HENT:  Head: Normocephalic and atraumatic.  Neck: No JVD present.  Cardiovascular: Normal rate and regular rhythm.  No murmur heard. Pulmonary/Chest: Effort normal. She has no rales.  Abdominal: Soft.  Musculoskeletal: She exhibits edema (trace bilat LE edema).  Neurological: She is alert and oriented to person, place, and time.  Skin: Skin is warm and dry.    ASSESSMENT & PLAN:    #1.  PAF  (paroxysmal atrial fibrillation) (HCC) Maintaining normal sinus rhythm.  CHADS2-VASc=3.  Continue current dose of Apixaban.  Arrange follow-up BMET, CBC.  #2.  Aortic valve insufficiency, etiology of cardiac valve disease unspecified Last echo in 10/17 with moderate aortic insufficiency.  Arrange follow-up echo.  #3.  Chronic diastolic CHF (congestive heart failure) (HCC) Volume status appears stable.  Continue current therapy.  #4.  Coronary artery calcification Noted on prior CT.  She is not on aspirin as she is on Apixaban.  It would be reasonable to have her on statin therapy for risk  reduction.  I discussed this with the patient and her mother today.  They are agreeable to taking statin therapy.  -Atorvastatin 20 mg daily  -Fasting lipids, LFTs in 6-8 weeks  #5.  Turner's syndrome CT in 2016 neg for aneurysm.  Follow-up echo will be arranged.  #6.  Benign hypertensive heart disease without heart failure The patient's blood pressure is controlled on her current regimen.  Continue current therapy.   Dispo:  Return in about 6 months (around 09/17/2017) for Routine Follow Up, w/ Dr. Acie Fredrickson, or Richardson Dopp, PA-C.   Medication Adjustments/Labs and Tests Ordered: Current medicines are reviewed at length with the patient today.  Concerns regarding medicines are outlined above.  Tests Ordered: Orders Placed This Encounter  Procedures  . Basic metabolic panel  . CBC with Differential/Platelet  . Hepatic function panel  . Lipid panel  . EKG 12-Lead  . ECHOCARDIOGRAM COMPLETE   Medication Changes: Meds ordered this encounter  Medications  . atorvastatin (LIPITOR) 20 MG tablet    Sig: Take 1 tablet (20 mg total) by mouth daily.    Dispense:  30 tablet    Refill:  [address] Richardson Dopp, [place]  03/17/2017 4:57 PM    [facility] [address], [place], [place]  [address] Phone: [phone]; Fax: [phone]

## 2017-03-17 NOTE — Patient Instructions (Addendum)
Medication Instructions:  1) START ATORVASTATIN 20 mg daily  Labwork: Your provider recommends that you return for FASTING lab work on April 28, 2017. This means you should have no food or drink after midnight except for medications with water.    Testing/Procedures: Your provider has requested that you have an echocardiogram on April 28, 2017. You will need to arrive by 9:00 AM for this appointment. Echocardiography is a painless test that uses sound waves to create images of your heart. It provides your doctor with information about the size and shape of your heart and how well your heart's chambers and valves are working. This procedure takes approximately one hour. There are no restrictions for this procedure.  Follow-Up: Your provider wants you to follow-up in: 6 months with Dr. Elease HashimotoNahser. You will receive a reminder letter in the mail two months in advance. If you don't receive a letter, please call our office to schedule the follow-up appointment.    Any Other Special Instructions Will Be Listed Below (If Applicable).     If you need a refill on your cardiac medications before your next appointment, please call your pharmacy.

## 2017-03-18 ENCOUNTER — Telehealth: Payer: Self-pay | Admitting: Physician Assistant

## 2017-03-18 NOTE — Telephone Encounter (Signed)
New Message   Patients mother is calling on behalf of daughter. Patient was just in to see Tereso NewcomerScott Weaver and he mentioned putting her on Lipitor. The mother is just wanting clarification as to why lipitor since she has heard some bad reviews about the medication. Please call to discuss.

## 2017-03-18 NOTE — Telephone Encounter (Signed)
Spoke with patient and her mother. Scheduled echo and fasting labs April 24th per their request. AVS updated and mailed to patient. They were grateful for call and agree with treatment plan.

## 2017-03-18 NOTE — Telephone Encounter (Signed)
Patient's mother questioning as to why patient was started on Lipitor, because she has heard that it has side effects such as muscle aches..  I clarified things with her and she verbalized understanding.Marland Kitchen.  She will call us if these symptoms occur..Marland Kitchen

## 2017-04-28 ENCOUNTER — Other Ambulatory Visit: Payer: Medicaid Other

## 2017-04-28 ENCOUNTER — Other Ambulatory Visit (HOSPITAL_COMMUNITY): Payer: Medicaid Other

## 2017-05-03 ENCOUNTER — Other Ambulatory Visit: Payer: Medicaid Other

## 2017-05-03 ENCOUNTER — Other Ambulatory Visit (HOSPITAL_COMMUNITY): Payer: Medicaid Other

## 2017-05-05 ENCOUNTER — Ambulatory Visit (HOSPITAL_COMMUNITY): Payer: Medicaid Other | Attending: Physician Assistant

## 2017-05-05 ENCOUNTER — Other Ambulatory Visit: Payer: Medicaid Other | Admitting: *Deleted

## 2017-05-05 ENCOUNTER — Other Ambulatory Visit: Payer: Self-pay

## 2017-05-05 ENCOUNTER — Telehealth: Payer: Self-pay | Admitting: *Deleted

## 2017-05-05 DIAGNOSIS — I2510 Atherosclerotic heart disease of native coronary artery without angina pectoris: Secondary | ICD-10-CM

## 2017-05-05 DIAGNOSIS — I351 Nonrheumatic aortic (valve) insufficiency: Secondary | ICD-10-CM | POA: Diagnosis present

## 2017-05-05 DIAGNOSIS — I480 Paroxysmal atrial fibrillation: Secondary | ICD-10-CM

## 2017-05-05 DIAGNOSIS — I110 Hypertensive heart disease with heart failure: Secondary | ICD-10-CM | POA: Insufficient documentation

## 2017-05-05 DIAGNOSIS — I509 Heart failure, unspecified: Secondary | ICD-10-CM | POA: Diagnosis not present

## 2017-05-05 DIAGNOSIS — I2584 Coronary atherosclerosis due to calcified coronary lesion: Secondary | ICD-10-CM

## 2017-05-05 LAB — CBC WITH DIFFERENTIAL/PLATELET
Basophils Absolute: 0 10*3/uL (ref 0.0–0.2)
Basos: 0 %
EOS (ABSOLUTE): 0 10*3/uL (ref 0.0–0.4)
Eos: 0 %
Hematocrit: 40.4 % (ref 34.0–46.6)
Hemoglobin: 14.2 g/dL (ref 11.1–15.9)
Immature Grans (Abs): 0.1 10*3/uL (ref 0.0–0.1)
Immature Granulocytes: 1 %
LYMPHS ABS: 1.7 10*3/uL (ref 0.7–3.1)
Lymphs: 16 %
MCH: 33.7 pg — ABNORMAL HIGH (ref 26.6–33.0)
MCHC: 35.1 g/dL (ref 31.5–35.7)
MCV: 96 fL (ref 79–97)
MONOS ABS: 1.7 10*3/uL — AB (ref 0.1–0.9)
Monocytes: 16 %
Neutrophils Absolute: 7.2 10*3/uL — ABNORMAL HIGH (ref 1.4–7.0)
Neutrophils: 67 %
PLATELETS: 345 10*3/uL (ref 150–379)
RBC: 4.21 x10E6/uL (ref 3.77–5.28)
RDW: 12.6 % (ref 12.3–15.4)
WBC: 10.8 10*3/uL (ref 3.4–10.8)

## 2017-05-05 LAB — HEPATIC FUNCTION PANEL
ALBUMIN: 4.4 g/dL (ref 3.6–4.8)
ALT: 23 IU/L (ref 0–32)
AST: 20 IU/L (ref 0–40)
Alkaline Phosphatase: 90 IU/L (ref 39–117)
Bilirubin Total: 0.9 mg/dL (ref 0.0–1.2)
Bilirubin, Direct: 0.27 mg/dL (ref 0.00–0.40)
Total Protein: 6.8 g/dL (ref 6.0–8.5)

## 2017-05-05 LAB — LIPID PANEL
CHOL/HDL RATIO: 2.4 ratio (ref 0.0–4.4)
Cholesterol, Total: 119 mg/dL (ref 100–199)
HDL: 49 mg/dL (ref >39)
LDL Calculated: 54 mg/dL (ref 0–99)
TRIGLYCERIDES: 80 mg/dL (ref 0–149)
VLDL CHOLESTEROL CAL: 16 mg/dL (ref 5–40)

## 2017-05-05 LAB — BASIC METABOLIC PANEL
BUN / CREAT RATIO: 29 — AB (ref 12–28)
BUN: 26 mg/dL (ref 8–27)
CHLORIDE: 94 mmol/L — AB (ref 96–106)
CO2: 25 mmol/L (ref 20–29)
Calcium: 10 mg/dL (ref 8.7–10.3)
Creatinine, Ser: 0.91 mg/dL (ref 0.57–1.00)
GFR calc Af Amer: 78 mL/min/{1.73_m2} (ref >59)
GFR calc non Af Amer: 68 mL/min/{1.73_m2} (ref >59)
GLUCOSE: 103 mg/dL — AB (ref 65–99)
POTASSIUM: 4.4 mmol/L (ref 3.5–5.2)
SODIUM: 135 mmol/L (ref 134–144)

## 2017-05-05 MED ORDER — PERFLUTREN LIPID MICROSPHERE
1.0000 mL | INTRAVENOUS | Status: AC | PRN
Start: 2017-05-05 — End: 2017-05-05
  Administered 2017-05-05: 1 mL via INTRAVENOUS

## 2017-05-05 NOTE — Telephone Encounter (Signed)
DPR ok to lmom lab work looks ok. No changes to be made with medications. If any questions please call [phone].

## 2017-05-05 NOTE — Telephone Encounter (Signed)
-----   Message from Beatrice Lecher, [place] sent at 05/05/2017  4:22 PM EDT ----- Renal function, potassium, hemoglobin, LFTs normal.  Lipids are optimal. Continue current medications and follow up as planned.  Tereso Newcomer, PA-C    05/05/2017 4:22 PM

## 2017-05-10 ENCOUNTER — Ambulatory Visit: Payer: Medicaid Other | Admitting: Pulmonary Disease

## 2017-05-24 ENCOUNTER — Ambulatory Visit: Payer: Medicaid Other | Admitting: Cardiovascular Disease

## 2017-05-24 ENCOUNTER — Encounter: Payer: Self-pay | Admitting: Cardiovascular Disease

## 2017-05-24 VITALS — BP 122/62 | HR 78 | Ht <= 58 in | Wt 128.8 lb

## 2017-05-24 DIAGNOSIS — I351 Nonrheumatic aortic (valve) insufficiency: Secondary | ICD-10-CM

## 2017-05-24 DIAGNOSIS — Q969 Turner's syndrome, unspecified: Secondary | ICD-10-CM | POA: Diagnosis not present

## 2017-05-24 NOTE — Patient Instructions (Signed)
Medication Instructions:  Your physician recommends that you continue on your current medications as directed. Please refer to the Current Medication list given to you today.   Labwork: None Ordered   Testing/Procedures: Your physician has requested that you have a TEE. During a TEE, sound waves are used to create images of your heart. It provides your doctor with information about the size and shape of your heart and how well your heart's chambers and valves are working. In this test, a transducer is attached to the end of a flexible tube that's guided down your throat and into your esophagus (the tube leading from you mouth to your stomach) to get a more detailed image of your heart. You are not awake for the procedure. Please see the instruction sheet given to you today. For further information please visit [email or web address]   Follow-Up: Your physician wants you to follow-up in: 6 months with Dr. Elease Hashimoto. You will receive a reminder letter in the mail two months in advance. If you don't receive a letter, please call our office to schedule the follow-up appointment.   If you need a refill on your cardiac medications before your next appointment, please call your pharmacy.   Thank you for choosing [facility]! Eligha Bridegroom, RN [phone]   You are scheduled for a TEE on Monday June 10 with Dr. Elease Hashimoto.  Please arrive at the [facility] (Main Entrance A) at [facility]: [address] [place], [place] [address] at 9:30 am. (1 hour prior to procedure unless lab work is needed; if lab work is needed arrive 1.5 hours ahead)  DIET: Nothing to eat or drink after midnight except a sip of water with medications (see medication instructions below)  Medication Instructions: Hold Lasix (Furosemide) and Kdur (potassium) on the morning of your  procedure  Continue your anticoagulant: Eliquis You will need to continue your anticoagulant after your procedure until  you are told by  your  Provider that it is safe to stop   Labs: If patient is on Coumadin, patient needs pt/INR, CBC, BMET within 3 days (No pt/INR needed for patients taking Xarelto, Eliquis, Pradaxa) For patients receiving anesthesia for TEE and all Cardioversion patients: BMET, CBC within 1 week  Your lab work will be done at the hospital prior to your procedure - you will need to arrive 1  hours ahead of your procedure  You must have a responsible person to drive you home and stay in the waiting area during your procedure. Failure to do so could result in cancellation.  Bring your insurance cards.  *Special Note: Every effort is made to have your procedure done on time. Occasionally there are emergencies that occur at the hospital that may cause delays. Please be patient if a delay does occur.

## 2017-05-24 NOTE — Progress Notes (Signed)
Cardiology Office Note:    Date:  05/24/2017   ID:  [name], DOB [birth year], MRN [id number]  PCP:  Tracey Harries, MD  Cardiologist:  Kristeen Miss, MD    Referring MD: Tracey Harries, MD   Chief Complaint  Patient presents with  . Atrial Fibrillation        [name] is a 62 y.o. female with a hx of atrial fib  [name] is a 62 y.o. female who is being seen today for the evaluation of paroxysmal atrial fib  at the request of Tracey Harries, MD.  Hx of Turners syndrome, mod AI, chronic diastolic CHF, OSA , PAF  On Eliquis  Cannot really tell when she is in Afib   Was seen by [facility] in Aug, 2017.   No cough , cold symptoms   May 24, 2017:  [name] was seen by Tereso Newcomer, PA.  Repeat echo shows severe AI. She is nere to discuss TEE  We discussed the recent finding of severe AI Scheduling a TEE    Past Medical History:  Diagnosis Date  . Aortic insufficiency    a. Mod AI by echo 2013.//  b. Echo 10/17: EF 60-65%, normal wall motion, grade 1 diastolic dysfunction, moderate AI, ascending aorta 35 mm, aortic root 30 mm  . Aortic stenosis    a. Mild AS by echo 2013.  [name] Cervix abnormality 1993   MILD ATYPICAL ADENOMATOUS HYPERPLASIA OF CERVIX  . Chronic diastolic CHF (congestive heart failure) (HCC) 08/29/2014  . Dermatitis 05-26-12   all over  since 9'13  . Difficult intubation    will plan on spinal anesthesia"small mouth/airway"  . Edema of both legs    a. right greater than left, due to Turner's syndrome.  [name] Hypertension   . Hypothyroidism   . Periorbital edema   . Speech disorder    occ. "stutter"  . Turner's syndrome     Past Surgical History:  Procedure Laterality Date  . CERVICAL CONE BIOPSY  1993  . COLPOSCOPY    . DILATION AND CURETTAGE OF UTERUS  1993   CONE BIOPSY  . HERNIA REPAIR    . JOINT REPLACEMENT     hip replacement  . TOTAL HIP ARTHROPLASTY     LEFT  . TOTAL KNEE ARTHROPLASTY Right 05/31/2012   Procedure: RIGHT  TOTAL KNEE ARTHROPLASTY;  Surgeon: Shelda Pal, MD;  Location: [facility];  Service: Orthopedics;  Laterality: Right;  . US ECHOCARDIOGRAPHY  06/20/2009   EF 55-60%    Current Medications: Current Meds  Medication Sig  . apixaban (ELIQUIS) 5 MG TABS tablet TAKE 1 TABLET BY MOUTH TWICE DAILY  . atorvastatin (LIPITOR) 20 MG tablet Take 1 tablet (20 mg total) by mouth daily.  . Cholecalciferol (VITAMIN D3) 2000 UNITS capsule Take 2,000 Units by mouth daily.  . famotidine (PEPCID) 40 MG tablet Take 40 mg by mouth 2 (two) times daily.  . furosemide (LASIX) 20 MG tablet TAKE 2 TABLETS BY MOUTH DAILY AND 1 TABLET EACH EVENING  . levothyroxine (SYNTHROID, LEVOTHROID) 88 MCG tablet TAKE ONE TABLET BY MOUTH ONCE DAILY  . metoprolol tartrate (LOPRESSOR) 25 MG tablet TAKE ONE TABLET BY MOUTH TWICE DAILY  . potassium chloride (KLOR-CON M10) 10 MEQ tablet Take 1 tablet (10 mEq total) by mouth 2 (two) times daily.  [name] spironolactone (ALDACTONE) 25 MG tablet Take 1 tablet (25 mg total) by mouth 2 (two) times daily.  . traMADol (ULTRAM) 50 MG tablet Take  50 mg by mouth every 6 (six) hours as needed for moderate pain.      Allergies:   Cephalexin   Social History   Socioeconomic History  . Marital status: Single    Spouse name: Not on file  . Number of children: Not on file  . Years of education: Not on file  . Highest education level: Not on file  Occupational History  . Occupation: N/A    Employer: NOT EMPLOYED  Social Needs  . Financial resource strain: Not on file  . Food insecurity:    Worry: Not on file    Inability: Not on file  . Transportation needs:    Medical: Not on file    Non-medical: Not on file  Tobacco Use  . Smoking status: Never Smoker  . Smokeless tobacco: Never Used  Substance and Sexual Activity  . Alcohol use: No  . Drug use: No  . Sexual activity: Never    Birth control/protection: Post-menopausal  Lifestyle  . Physical activity:    Days per week: Not on file     Minutes per session: Not on file  . Stress: Not on file  Relationships  . Social connections:    Talks on phone: Not on file    Gets together: Not on file    Attends religious service: Not on file    Active member of club or organization: Not on file    Attends meetings of clubs or organizations: Not on file    Relationship status: Not on file  Other Topics Concern  . Not on file  Social History Narrative  . Not on file     Family History: The patient's family history includes Atrial fibrillation in her mother; Breast cancer in her cousin; Cancer in her mother; Diabetes in her maternal uncle; Hypertension in her mother. ROS:   Please see the history of present illness.     All other systems reviewed and are negative.  EKGs/Labs/Other Studies Reviewed:    The following studies were reviewed today:   EKG:     Recent Labs: 05/05/2017: ALT 23; BUN 26; Creatinine, Ser 0.91; Hemoglobin 14.2; Platelets 345; Potassium 4.4; Sodium 135  Recent Lipid Panel    Component Value Date/Time   CHOL 119 05/05/2017 1002   TRIG 80 05/05/2017 1002   HDL 49 05/05/2017 1002   CHOLHDL 2.4 05/05/2017 1002   CHOLHDL 6.3 08/29/2014 0800   VLDL 33 08/29/2014 0800   LDLCALC 54 05/05/2017 1002    Physical Exam:    Physical Exam: Blood pressure 122/62, pulse 78, height  (1.422 m), weight 128 lb 12.8 oz (58.4 kg), SpO2 97 %.  GEN:   Middle age, female,  Short stature,  Turners syndrome  HEENT: Normal NECK: No JVD; No carotid bruits LYMPHATICS: No lymphadenopathy CARDIAC: RRR - soft systolic and soft diastolic murmur  RESPIRATORY:  Clear to auscultation without rales, wheezing or rhonchi  ABDOMEN: Soft, non-tender, non-distended MUSCULOSKELETAL:  No edema; No deformity  SKIN: Warm and dry NEUROLOGIC:  Alert and oriented x 3   ASSESSMENT:    No diagnosis found. PLAN:    In order of problems listed above:  1. Turners Syndrome:      [name] has severe aortic insufficiency by recent  echo Will schedule her for a TEE to further define the aortic valve..   She has short stature,  Was told that she has a small airway. Will see if we can have anesthesia assist [place] with this .  2.  Paroxysmal atrial fib:  Remains in NSR.  On eliquis .  She is asymptomatic . Cannot tell when she is in AF .  Continue to monitor .    Medication Adjustments/Labs and Tests Ordered: Current medicines are reviewed at length with the patient today.  Concerns regarding medicines are outlined above.  No orders of the defined types were placed in this encounter.   No orders of the defined types were placed in this encounter.   Signed, Kristeen Miss, MD  05/24/2017 2:32 PM    [facility]

## 2017-06-02 ENCOUNTER — Other Ambulatory Visit: Payer: Self-pay | Admitting: Cardiovascular Disease

## 2017-06-09 ENCOUNTER — Encounter: Payer: Self-pay | Admitting: Pulmonary Disease

## 2017-06-09 ENCOUNTER — Ambulatory Visit (INDEPENDENT_AMBULATORY_CARE_PROVIDER_SITE_OTHER): Payer: Medicaid Other | Admitting: Pulmonary Disease

## 2017-06-09 DIAGNOSIS — I5032 Chronic diastolic (congestive) heart failure: Secondary | ICD-10-CM | POA: Diagnosis not present

## 2017-06-09 DIAGNOSIS — G4733 Obstructive sleep apnea (adult) (pediatric): Secondary | ICD-10-CM

## 2017-06-09 NOTE — Assessment & Plan Note (Signed)
Take p.m. dose of Lasix around 3 PM to avoid nocturia

## 2017-06-09 NOTE — Patient Instructions (Addendum)
Expectation is that she  use her BiPAP machine at least 4 hours every night. Change to auto BiPAP settings and recheck download in 1 month

## 2017-06-09 NOTE — Progress Notes (Signed)
   Subjective:    Patient ID: [name], female    DOB: October 29, 1955, 62 y.o.   MRN: [id number]  HPI  [age] yowf never smoker with Turner's syndrome\ & severe OSA   -dc'd on bipap and 02 3lpm p admit 08/2014 due to hypercarbic resp failure &CHF   She has systolic CHF with EF 45%  She was taken off oxygen in 2017  She continued to have residual events on BiPAP and underwent a repeat titration study and in 06/2016, settings were adjusted to 21/17   She presents for routine follow-up, sleeps in the same room with her mother, mom is on oxygen now.  Compliant with Lasix she takes p.m. dose around 6:54 PM, multiple nocturnal awakenings for nocturia. For some reason on download review, compliance seems to have dropped to about 3 hours every night she does seem to put the machine on every night, not clear why usage is poor.  She is not able to provide any clear reason.  Mom seems to think that she keeps the mask on through the night      Significant tests/ events  NPSG 08/29/12: Severe, with an AHI of 79  Bipap titration 08/2014-optimal 25/20 with small fullface mask  - Autobipap download 12/2014 AHI 20/h, leak ++, avg 18/13, excellent usage  06/2016 bipap 21/17  CT chest 08/2014  >>7 mm lingular nodule, Slight decrease in follow-up 10/2015  Review of Systems Patient denies significant dyspnea,cough, hemoptysis,  chest pain, palpitations, pedal edema, orthopnea, paroxysmal nocturnal dyspnea, lightheadedness, nausea, vomiting, abdominal or  leg pains      Objective:   Physical Exam  Gen. Pleasant, well-nourished, in no distress, flat affect ENT - no thrush, no post nasal drip Neck: No JVD, no thyromegaly, no carotid bruits Lungs: no use of accessory muscles, no dullness to percussion, clear without rales or rhonchi  Cardiovascular: Rhythm regular, heart sounds  normal, no murmurs or gallops, 1+ peripheral edema Musculoskeletal: No deformities, no cyanosis or clubbing         Assessment & Plan:

## 2017-06-09 NOTE — Assessment & Plan Note (Signed)
Change to auto BiPAP settings and recheck download in 1 month  Weight loss encouraged, compliance with goal of at least 4-6 hrs every night is the expectation. Advised against medications with sedative side effects Cautioned against driving when sleepy - understanding that sleepiness will vary on a day to day basis

## 2017-06-10 ENCOUNTER — Telehealth: Payer: Self-pay | Admitting: Pulmonary Disease

## 2017-06-10 DIAGNOSIS — G4733 Obstructive sleep apnea (adult) (pediatric): Secondary | ICD-10-CM

## 2017-06-10 NOTE — Telephone Encounter (Signed)
Called and spoke to [name] with [facility].  [name] is requesting clarification on settings for max IPAP. Order was placed as shown below.   RA please advise. Thanks  BiPAP: Max IPAP = 22cm     Min EPAP = 10-17cm     Pressure Support = 4cm

## 2017-06-10 NOTE — Telephone Encounter (Signed)
21 cm

## 2017-06-10 NOTE — Telephone Encounter (Signed)
RA just to clarify you would like max IPAP to be 21-22cm and min EPAP 10-17cm?

## 2017-06-11 ENCOUNTER — Other Ambulatory Visit: Payer: Self-pay | Admitting: Cardiovascular Disease

## 2017-06-11 DIAGNOSIS — I351 Nonrheumatic aortic (valve) insufficiency: Secondary | ICD-10-CM

## 2017-06-11 NOTE — Progress Notes (Signed)
Orders for TEE written  

## 2017-06-11 NOTE — H&P (View-Only) (Signed)
Orders for TEE written  

## 2017-06-11 NOTE — Telephone Encounter (Signed)
Order has been placed. Nothing further was needed. 

## 2017-06-11 NOTE — Telephone Encounter (Signed)
EPAP min 10 IPAP max 21 PS +4

## 2017-06-14 ENCOUNTER — Other Ambulatory Visit: Payer: Self-pay

## 2017-06-14 ENCOUNTER — Ambulatory Visit (HOSPITAL_BASED_OUTPATIENT_CLINIC_OR_DEPARTMENT_OTHER)
Admission: RE | Admit: 2017-06-14 | Discharge: 2017-06-14 | Disposition: A | Discharge status: Home / Self Care | Payer: Medicaid Other | Source: Ambulatory Visit | Attending: Cardiovascular Disease | Admitting: Cardiovascular Disease

## 2017-06-14 ENCOUNTER — Ambulatory Visit (HOSPITAL_COMMUNITY)
Admission: RE | Admit: 2017-06-14 | Discharge: 2017-06-14 | Disposition: A | Discharge status: Home / Self Care | Payer: Medicaid Other | Source: Ambulatory Visit | Attending: Cardiovascular Disease | Admitting: Cardiovascular Disease

## 2017-06-14 ENCOUNTER — Encounter (HOSPITAL_COMMUNITY): Payer: Self-pay | Admitting: *Deleted

## 2017-06-14 ENCOUNTER — Ambulatory Visit (HOSPITAL_COMMUNITY): Payer: Medicaid Other | Admitting: Anesthesiology

## 2017-06-14 ENCOUNTER — Encounter (HOSPITAL_COMMUNITY)
Admission: RE | Disposition: A | Discharge status: Home / Self Care | Payer: Self-pay | Source: Ambulatory Visit | Attending: Cardiovascular Disease

## 2017-06-14 DIAGNOSIS — I10 Essential (primary) hypertension: Secondary | ICD-10-CM | POA: Diagnosis not present

## 2017-06-14 DIAGNOSIS — G4730 Sleep apnea, unspecified: Secondary | ICD-10-CM | POA: Diagnosis not present

## 2017-06-14 DIAGNOSIS — I471 Supraventricular tachycardia, unspecified: Secondary | ICD-10-CM

## 2017-06-14 DIAGNOSIS — Q210 Ventricular septal defect: Secondary | ICD-10-CM

## 2017-06-14 DIAGNOSIS — I351 Nonrheumatic aortic (valve) insufficiency: Secondary | ICD-10-CM | POA: Diagnosis not present

## 2017-06-14 HISTORY — PX: TEE WITHOUT CARDIOVERSION: SHX5443

## 2017-06-14 HISTORY — PX: CARDIOVERSION: SHX1299

## 2017-06-14 SURGERY — ECHOCARDIOGRAM, TRANSESOPHAGEAL
Anesthesia: Monitor Anesthesia Care

## 2017-06-14 MED ORDER — ADENOSINE 6 MG/2ML IV SOLN
INTRAVENOUS | Status: AC
  Filled 2017-06-14: qty 2

## 2017-06-14 MED ORDER — LIDOCAINE 2% (20 MG/ML) 5 ML SYRINGE
INTRAMUSCULAR | Status: DC | PRN
  Administered 2017-06-14: 100 mg via INTRAVENOUS

## 2017-06-14 MED ORDER — PROPOFOL 500 MG/50ML IV EMUL
INTRAVENOUS | Status: DC | PRN
  Administered 2017-06-14: 100 ug/kg/min via INTRAVENOUS

## 2017-06-14 MED ORDER — METOPROLOL TARTRATE 5 MG/5ML IV SOLN
INTRAVENOUS | Status: AC
  Filled 2017-06-14: qty 10

## 2017-06-14 MED ORDER — PROPOFOL 10 MG/ML IV BOLUS
INTRAVENOUS | Status: DC | PRN
  Administered 2017-06-14: 20 mg via INTRAVENOUS
  Administered 2017-06-14: 30 mg via INTRAVENOUS
  Administered 2017-06-14: 20 mg via INTRAVENOUS

## 2017-06-14 MED ORDER — ADENOSINE 6 MG/2ML IV SOLN
INTRAVENOUS | Status: AC
  Filled 2017-06-14: qty 4

## 2017-06-14 MED ORDER — LACTATED RINGERS IV SOLN
INTRAVENOUS | Status: DC | PRN
  Administered 2017-06-14 (×2): via INTRAVENOUS

## 2017-06-14 MED ORDER — SODIUM CHLORIDE 0.9 % IV SOLN: INTRAVENOUS | Status: DC

## 2017-06-14 MED ORDER — ADENOSINE 6 MG/2ML IV SOLN
INTRAVENOUS | Status: DC | PRN
  Administered 2017-06-14: 6 mg via INTRAVENOUS

## 2017-06-14 NOTE — CV Procedure (Signed)
    Transesophageal Echocardiogram Note  [name] [id number] Aug 21, 1955  Procedure: Transesophageal Echocardiogram Indications: Aortic insufficiency   Procedure Details Consent: Obtained Time Out: Verified patient identification, verified procedure, site/side was marked, verified correct patient position, special equipment/implants available, Radiology Safety Procedures followed,  medications/allergies/relevent history reviewed, required imaging and test results available.  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 We performed synchrounous cardioversion with 200 J. Successful cardioversion  Impression:   1. Severe Aortic insufficiency  2. Moderate sized VSD   Patient did tolerate procedure well.   Vesta MixerPhilip J. Nahser, Montez HagemanJr., MD, [facility] 06/14/2017, 11:20 AM

## 2017-06-14 NOTE — Anesthesia Preprocedure Evaluation (Addendum)
Anesthesia Evaluation  Patient identified by MRN, date of birth, ID band Patient awake    Reviewed: Allergy & Precautions, NPO status , Patient's Chart, lab work & pertinent test results  History of Anesthesia Complications (+) DIFFICULT AIRWAY  Airway Mallampati: IV  TM Distance: <3 FB Neck ROM: Full  Mouth opening: Limited Mouth Opening  Dental no notable dental hx.    Pulmonary sleep apnea ,    Pulmonary exam normal breath sounds clear to auscultation       Cardiovascular hypertension, Normal cardiovascular exam+ Valvular Problems/Murmurs AI  Rhythm:Regular Rate:Normal  . Mod AI by echo 2013.//  b. Echo 10/17: EF 60-65%, normal wall motion, grade 1 diastolic dysfunction, moderate AI, ascending aorta 35 mm, aortic root 30 mm   Neuro/Psych negative neurological ROS  negative psych ROS   GI/Hepatic negative GI ROS, Neg liver ROS,   Endo/Other  negative endocrine ROSHypothyroidism   Renal/GU negative Renal ROS  negative genitourinary   Musculoskeletal negative musculoskeletal ROS (+)   Abdominal   Peds negative pediatric ROS (+)  Hematology negative hematology ROS (+)   Anesthesia Other Findings   Reproductive/Obstetrics negative OB ROS                            Anesthesia Physical Anesthesia Plan  ASA: III  Anesthesia Plan: MAC   Post-op Pain Management:    Induction: Intravenous  PONV Risk Score and Plan: 0  Airway Management Planned: Nasal Cannula  Additional Equipment:   Intra-op Plan:   Post-operative Plan:   Informed Consent: I have reviewed the patients History and Physical, chart, labs and discussed the procedure including the risks, benefits and alternatives for the proposed anesthesia with the patient or authorized representative who has indicated his/her understanding and acceptance.   Dental advisory given  Plan Discussed with: CRNA and Surgeon  Anesthesia  Plan Comments:         Anesthesia Quick Evaluation

## 2017-06-14 NOTE — Progress Notes (Signed)
2D Echocardiogram has been performed.  Pieter PartridgeBrooke S Victorio Creeden 06/14/2017, 11:37 AM

## 2017-06-14 NOTE — Interval H&P Note (Signed)
History and Physical Interval Note:  06/14/2017 10:35 AM  [name]  has presented today for surgery, with the diagnosis of aortic insufficiency  The various methods of treatment have been discussed with the patient and family. After consideration of risks, benefits and other options for treatment, the patient has consented to  Procedure(s): TRANSESOPHAGEAL ECHOCARDIOGRAM (TEE) (N/A) as a surgical intervention .  The patient's history has been reviewed, patient examined, no change in status, stable for surgery.  I have reviewed the patient's chart and labs.  Questions were answered to the patient's satisfaction.     Kristeen MissPhilip Abraham Entwistle

## 2017-06-14 NOTE — Anesthesia Postprocedure Evaluation (Signed)
Anesthesia Post Note  Patient: CORNESHA RADZIEWICZ  Procedure(s) Performed: TRANSESOPHAGEAL ECHOCARDIOGRAM (TEE) (N/A ) CARDIOVERSION     Patient location during evaluation: PACU Anesthesia Type: MAC Level of consciousness: awake and alert Pain management: pain level controlled Vital Signs Assessment: post-procedure vital signs reviewed and stable Respiratory status: spontaneous breathing, nonlabored ventilation, respiratory function stable and patient connected to nasal cannula oxygen Cardiovascular status: stable and blood pressure returned to baseline Postop Assessment: no apparent nausea or vomiting Anesthetic complications: no    Last Vitals:  Vitals:   06/14/17 0934 06/14/17 1127  BP: (!) 126/47 (!) 112/39  Pulse:  74  Resp: 17 (!) 21  Temp: 36.6 C 37.1 C  SpO2: 96% 93%    Last Pain:  Vitals:   06/14/17 1127  TempSrc: Oral  PainSc: 0-No pain                 Corinna Burkman S

## 2017-06-14 NOTE — Transfer of Care (Signed)
Immediate Anesthesia Transfer of Care Note  Patient: [name]  Procedure(s) Performed: TRANSESOPHAGEAL ECHOCARDIOGRAM (TEE) (N/A ) CARDIOVERSION  Patient Location: Endoscopy Unit  Anesthesia Type:MAC  Level of Consciousness: awake, alert  and oriented  Airway & Oxygen Therapy: Patient Spontanous Breathing  Post-op Assessment: Report given to RN and Post -op Vital signs reviewed and stable  Post vital signs: Reviewed and stable  Last Vitals:  Vitals Value Taken Time  BP 112/39 06/14/2017 11:26 AM  Temp    Pulse 74 06/14/2017 11:26 AM  Resp 21 06/14/2017 11:26 AM  SpO2 93 % 06/14/2017 11:26 AM  Vitals shown include unvalidated device data.  Last Pain:  Vitals:   06/14/17 0934  TempSrc: Oral  PainSc: 0-No pain         Complications: No apparent anesthesia complications

## 2017-06-14 NOTE — Discharge Instructions (Signed)

## 2017-06-15 ENCOUNTER — Telehealth: Payer: Self-pay | Admitting: Nurse Practitioner

## 2017-06-15 ENCOUNTER — Encounter (HOSPITAL_COMMUNITY): Payer: Self-pay | Admitting: Cardiovascular Disease

## 2017-06-15 DIAGNOSIS — Q210 Ventricular septal defect: Secondary | ICD-10-CM

## 2017-06-15 NOTE — Telephone Encounter (Signed)
Received message from Dr. Elease HashimotoNahser: Donna FiscalLori had an interesting echo.  We found a VSD in addition to her severe AI  Ive reviewed it with Dr. Delton SeeNelson.  She would like to do a CT angiogram  Order like a coronary CT angiogram.  With comments to review coronaries, perimembranous VSD, and aortic root size.

## 2017-06-16 ENCOUNTER — Telehealth: Payer: Self-pay | Admitting: Cardiovascular Disease

## 2017-06-16 NOTE — Telephone Encounter (Signed)
New Message  Pt calling because Dr. Elease HashimotoNahser referred her to Dr. Laneta SimmersBartle and she wants to know what he specialize in. Please call

## 2017-06-16 NOTE — Telephone Encounter (Signed)
Coronary CT orders placed and messages to scheduling and pre-cert. Patient's mother is aware that our office will call to schedule and with test instructions once appointment is made.

## 2017-06-16 NOTE — Telephone Encounter (Signed)
Spoke with patient's mother, [name], who states she is the one that made the original call. I answered her questions about the upcoming appointment with Dr. Laneta SimmersBartle and the coronary CT that we have ordered. I answered questions to her satisfaction and advised that someone from our office will call back with CT schedule and instructions.  [name] verbalized understanding and was very thankful for my help.

## 2017-06-22 NOTE — Telephone Encounter (Addendum)
Left message for patient or her mother to call back to review CT instructions  [facility] at xx:xx AM (30-45 minutes prior to test start time)  [facility]  [address] [place], [place] [address] [phone]  Proceed to the [facility] Department (First Floor).  Please follow these instructions carefully (unless otherwise directed):  Hold all erectile dysfunction medications at least 48 hours prior to test.  On the Night Before the Test: . Drink plenty of water. . Do not consume any caffeinated/decaffeinated beverages or chocolate 12 hours prior to your test. . Do not take any antihistamines 12 hours prior to your test.   On the Day of the Test: . Drink plenty of water. Do not drink any water within one hour of the test. . Do not eat any food 4 hours prior to the test. . You may take your regular medications prior to the test. . HOLD Furosemide morning of the test.  After the Test: . Drink plenty of water. . After receiving IV contrast, you may experience a mild flushed feeling. This is normal. . On occasion, you may experience a mild rash up to 24 hours after the test. This is not dangerous. If this occurs, you can take Benadryl 25 mg and increase your fluid intake. . If you experience trouble breathing, this can be serious. If it is severe call 911 IMMEDIATELY. If it is mild, please call our office. . If you take any of these medications: Glipizide/Metformin, Avandament, Glucavance, please do not take 48 hours after completing test.   Instructions reviewed with patient's mother who verbalized understanding. She states she has written down the instructions and will call back with questions. Patient is scheduled for bmet on 6/26. Patient's mother thanked me for the call.

## 2017-06-23 ENCOUNTER — Telehealth: Payer: Self-pay | Admitting: Cardiovascular Disease

## 2017-06-23 NOTE — Telephone Encounter (Signed)
Did not need this encounter °

## 2017-06-28 ENCOUNTER — Other Ambulatory Visit: Payer: Medicaid Other | Admitting: *Deleted

## 2017-06-28 DIAGNOSIS — Q210 Ventricular septal defect: Secondary | ICD-10-CM

## 2017-06-29 LAB — BASIC METABOLIC PANEL
BUN/Creatinine Ratio: 31 — ABNORMAL HIGH (ref 12–28)
BUN: 28 mg/dL — AB (ref 8–27)
CALCIUM: 9.9 mg/dL (ref 8.7–10.3)
CO2: 25 mmol/L (ref 20–29)
Chloride: 96 mmol/L (ref 96–106)
Creatinine, Ser: 0.89 mg/dL (ref 0.57–1.00)
GFR, EST AFRICAN AMERICAN: 80 mL/min/{1.73_m2} (ref >59)
GFR, EST NON AFRICAN AMERICAN: 70 mL/min/{1.73_m2} (ref >59)
Glucose: 89 mg/dL (ref 65–99)
Potassium: 4.9 mmol/L (ref 3.5–5.2)
Sodium: 136 mmol/L (ref 134–144)

## 2017-06-30 ENCOUNTER — Other Ambulatory Visit: Payer: Medicaid Other

## 2017-07-05 ENCOUNTER — Encounter: Payer: Medicaid Other | Admitting: Surgery

## 2017-07-09 ENCOUNTER — Ambulatory Visit (HOSPITAL_COMMUNITY)
Admission: RE | Admit: 2017-07-09 | Discharge: 2017-07-09 | Disposition: A | Discharge status: Home / Self Care | Payer: Medicaid Other | Source: Ambulatory Visit | Attending: Cardiovascular Disease | Admitting: Cardiovascular Disease

## 2017-07-09 ENCOUNTER — Encounter (HOSPITAL_COMMUNITY): Payer: Self-pay

## 2017-07-09 ENCOUNTER — Ambulatory Visit (HOSPITAL_COMMUNITY): Payer: Medicaid Other

## 2017-07-09 DIAGNOSIS — Q210 Ventricular septal defect: Secondary | ICD-10-CM | POA: Insufficient documentation

## 2017-07-09 DIAGNOSIS — I351 Nonrheumatic aortic (valve) insufficiency: Secondary | ICD-10-CM | POA: Diagnosis not present

## 2017-07-09 MED ORDER — NITROGLYCERIN 0.4 MG SL SUBL
0.8000 mg | SUBLINGUAL_TABLET | Freq: Once | SUBLINGUAL | Status: AC
  Administered 2017-07-09: 0.8 mg via SUBLINGUAL
  Filled 2017-07-09: qty 25

## 2017-07-09 MED ORDER — IOHEXOL 350 MG/ML SOLN
100.0000 mL | Freq: Once | INTRAVENOUS | Status: AC | PRN
  Administered 2017-07-09: 80 mL via INTRAVENOUS

## 2017-07-09 MED ORDER — NITROGLYCERIN 0.4 MG SL SUBL
SUBLINGUAL_TABLET | SUBLINGUAL | Status: AC
  Administered 2017-07-09: 0.8 mg via SUBLINGUAL
  Filled 2017-07-09: qty 2

## 2017-07-09 MED ORDER — METOPROLOL TARTRATE 5 MG/5ML IV SOLN
INTRAVENOUS | Status: AC
  Administered 2017-07-09: 5 mg via INTRAVENOUS
  Filled 2017-07-09: qty 10

## 2017-07-09 MED ORDER — METOPROLOL TARTRATE 5 MG/5ML IV SOLN
5.0000 mg | INTRAVENOUS | Status: AC | PRN
  Administered 2017-07-09 (×2): 5 mg via INTRAVENOUS
  Filled 2017-07-09 (×3): qty 5

## 2017-07-14 ENCOUNTER — Encounter: Payer: Medicaid Other | Admitting: Surgery

## 2017-07-14 ENCOUNTER — Telehealth: Payer: Self-pay | Admitting: Nurse Practitioner

## 2017-07-14 NOTE — Telephone Encounter (Signed)
-----   Message from Vesta MixerPhilip J Nahser, MD sent at 07/12/2017  2:19 PM EDT ----- We were not able to see the coronaries - mostly due to artifact from the severe aortic insufficiency. She  will need a cardiac cath prior to any further discussion about fixing the aortic valve.  She should be worked into an APP schedule on a day that I am there. Thanks

## 2017-07-14 NOTE — Telephone Encounter (Signed)
Patient's mother aware of appointment tomorrow at our office. She thanked me for the call.

## 2017-07-14 NOTE — Telephone Encounter (Signed)
Reviewed CT results with patient's mother who verbalized understanding. I advised that we will need to schedule right and left heart cath for patient next week. Mother requests procedure be scheduled with Dr. Katrinka BlazingSmith. I advised I would call her back with procedure date, time and instructions.  Patient is scheduled for Tuesday 7/16 with Dr. Katrinka BlazingSmith. Per Dr. Elease HashimotoNahser, patient will need an ov with APP prior to Tuesday for updated H & P, ekg and labs. I left a message for patient and mother that I have scheduled patient for an appointment on 7/11 at 10:00 with Ronie Spiesayna Dunn, PA.

## 2017-07-15 ENCOUNTER — Telehealth: Payer: Self-pay | Admitting: Physician Assistant

## 2017-07-15 ENCOUNTER — Ambulatory Visit: Payer: Medicaid Other | Admitting: Physician Assistant

## 2017-07-15 ENCOUNTER — Encounter: Payer: Self-pay | Admitting: Physician Assistant

## 2017-07-15 VITALS — BP 122/64 | HR 69 | Ht <= 58 in | Wt 126.0 lb

## 2017-07-15 DIAGNOSIS — I471 Supraventricular tachycardia: Secondary | ICD-10-CM | POA: Diagnosis not present

## 2017-07-15 DIAGNOSIS — I5032 Chronic diastolic (congestive) heart failure: Secondary | ICD-10-CM | POA: Diagnosis not present

## 2017-07-15 DIAGNOSIS — I480 Paroxysmal atrial fibrillation: Secondary | ICD-10-CM

## 2017-07-15 DIAGNOSIS — I351 Nonrheumatic aortic (valve) insufficiency: Secondary | ICD-10-CM

## 2017-07-15 DIAGNOSIS — I5042 Chronic combined systolic (congestive) and diastolic (congestive) heart failure: Secondary | ICD-10-CM

## 2017-07-15 DIAGNOSIS — Q210 Ventricular septal defect: Secondary | ICD-10-CM | POA: Diagnosis not present

## 2017-07-15 DIAGNOSIS — R931 Abnormal findings on diagnostic imaging of heart and coronary circulation: Secondary | ICD-10-CM

## 2017-07-15 LAB — CBC
HEMOGLOBIN: 14.1 g/dL (ref 11.1–15.9)
Hematocrit: 40.4 % (ref 34.0–46.6)
MCH: 33.8 pg — AB (ref 26.6–33.0)
MCHC: 34.9 g/dL (ref 31.5–35.7)
MCV: 97 fL (ref 79–97)
Platelets: 362 10*3/uL (ref 150–450)
RBC: 4.17 x10E6/uL (ref 3.77–5.28)
RDW: 12.7 % (ref 12.3–15.4)
WBC: 11.7 10*3/uL — AB (ref 3.4–10.8)

## 2017-07-15 LAB — BASIC METABOLIC PANEL
BUN / CREAT RATIO: 23 (ref 12–28)
BUN: 21 mg/dL (ref 8–27)
CO2: 25 mmol/L (ref 20–29)
Calcium: 10 mg/dL (ref 8.7–10.3)
Chloride: 92 mmol/L — ABNORMAL LOW (ref 96–106)
Creatinine, Ser: 0.92 mg/dL (ref 0.57–1.00)
GFR calc Af Amer: 77 mL/min/{1.73_m2} (ref >59)
GFR calc non Af Amer: 67 mL/min/{1.73_m2} (ref >59)
GLUCOSE: 106 mg/dL — AB (ref 65–99)
Potassium: 4.1 mmol/L (ref 3.5–5.2)
Sodium: 135 mmol/L (ref 134–144)

## 2017-07-15 LAB — MAGNESIUM: Magnesium: 2.1 mg/dL (ref 1.6–2.3)

## 2017-07-15 LAB — TSH: TSH: 1.51 u[IU]/mL (ref 0.450–4.500)

## 2017-07-15 MED ORDER — POTASSIUM CHLORIDE ER 10 MEQ PO TBCR: 10.0000 meq | EXTENDED_RELEASE_TABLET | Freq: Every day | ORAL | 2 refills | Status: DC

## 2017-07-15 NOTE — Progress Notes (Signed)
Cardiology Office Note    Date:  07/15/2017  ID:  Donna DeeLori M Mathe, DOB [birth year], MRN [id number] PCP:  Tracey HarriesBouska, David, MD  Cardiologist:  Kristeen MissPhilip Nahser, MD   Chief Complaint: discuss cath  History of Present Illness:  [name] is a 62 y.o. female with history of paroxysmal atrial fib, Turner's syndrome, aortic insufficiency, chronic diastolic CHF, OSA, HTN, HLD, hypothyroidism, stutter who presents to discuss cath. Recent workup has shown progression to severe AI. 2D echo 05/05/17 showed EF 55-60%, grade 1 DD, severe AI, moderate LAE. TEE 06/14/17 showed EF 50-55%, severe AI, mild MR.  The procedure was aborted early due to airway concerns and SVT which required cardioversion. There was also a small - moderate sized perimembranous VSD. Cardiac CT   Overread showed small pulm nodule in LUL statistically likely benign (radiology recommended f/u CT 12 months if high risk, no f/u if low risk). Calcium score 896 (99th percentile), poor quality coronary study due to AI, "Ascending Aortic root 3.5 cm with abnormal appearing arch which is elongated and horizontal with 'pseudo coarctation' tapering from 19 mm to 15 mm in it's distal portion before descending." No perimembranous VSD was noted. Labs 06/2017 K 4.9, Cr 0.99, BUN 28, 05/2017 Hgb 14.4, Plt 345, LFTs wnl, LDL 54.  Dr. Elease HashimotoNahser recommended she come in today to discuss cath. She is here with her mother Chyrl CivatteJoann. She is A+Ox3. The patient denies any complaints of CP, SOB, palpitations, bleeding or syncope. She has chronic edema R>L ever since teenager years felt due to Turner's syndrome. She seems to comprehend what we are telling her, and asks if this might lead to open heart surgery eventually. She is agreeable to proceed.   Past Medical History:  Diagnosis Date  . Aortic insufficiency    a. severe by echo, TEE 2019.  Marland Kitchen. Cervix abnormality 1993   MILD ATYPICAL ADENOMATOUS HYPERPLASIA OF CERVIX  . Chronic diastolic CHF (congestive heart failure)  (HCC) 08/29/2014  . Dermatitis 05-26-12   all over  since 9'13  . Difficult intubation    will plan on spinal anesthesia"small mouth/airway"  . Edema of both legs    a. right greater than left, due to Turner's syndrome.  Marland Kitchen. Hypertension   . Hypothyroidism   . PAF (paroxysmal atrial fibrillation) (HCC)   . Periorbital edema   . Speech disorder    occ. "stutter"  . Turner's syndrome   . VSD (ventricular septal defect)    a. ? by TEE 06/2017, not seen on cardiac CT.    Past Surgical History:  Procedure Laterality Date  . CARDIOVERSION  06/14/2017   Procedure: CARDIOVERSION;  Surgeon: Elease HashimotoNahser, Deloris PingPhilip J, MD;  [facility];  Service: Cardiovascular;;  . CERVICAL CONE BIOPSY  1993  . COLPOSCOPY    . DILATION AND CURETTAGE OF UTERUS  1993   CONE BIOPSY  . HERNIA REPAIR    . JOINT REPLACEMENT     hip replacement  . TEE WITHOUT CARDIOVERSION N/A 06/14/2017   Procedure: TRANSESOPHAGEAL ECHOCARDIOGRAM (TEE);  Surgeon: Elease HashimotoNahser, Deloris PingPhilip J, MD;  [facility];  Service: Cardiovascular;  Laterality: N/A;  . TOTAL HIP ARTHROPLASTY     LEFT  . TOTAL KNEE ARTHROPLASTY Right 05/31/2012   Procedure: RIGHT TOTAL KNEE ARTHROPLASTY;  Surgeon: Shelda PalMatthew D Olin, MD;  Location: [facility];  Service: Orthopedics;  Laterality: Right;  . US ECHOCARDIOGRAPHY  06/20/2009   EF 55-60%    Current Medications: Current Meds  Medication Sig  . acetaminophen (TYLENOL)  500 MG tablet Take 1,000 mg by mouth every 6 (six) hours as needed for moderate pain or headache.  Marland Kitchen apixaban (ELIQUIS) 5 MG TABS tablet TAKE 1 TABLET BY MOUTH TWICE DAILY  . atorvastatin (LIPITOR) 20 MG tablet Take 1 tablet (20 mg total) by mouth daily.  . Cholecalciferol (VITAMIN D3) 2000 UNITS capsule Take 2,000 Units by mouth daily.  . famotidine (PEPCID) 40 MG tablet Take 40 mg by mouth 2 (two) times daily.  . furosemide (LASIX) 20 MG tablet TAKE 2 TABLETS BY MOUTH DAILY AND 1 TABLET EACH EVENING (Patient taking differently: Take 20-40  mg by mouth See admin instructions. TAKE 40 MG BY MOUTH DAILY AND 20 MG BY MOUTH EACH EVENING)  . levothyroxine (SYNTHROID, LEVOTHROID) 88 MCG tablet TAKE ONE TABLET BY MOUTH ONCE DAILY  . metoprolol tartrate (LOPRESSOR) 25 MG tablet TAKE 1 TABLET BY MOUTH TWICE DAILY  . potassium chloride (KLOR-CON M10) 10 MEQ tablet Take 1 tablet (10 mEq total) by mouth 2 (two) times daily.  Marland Kitchen spironolactone (ALDACTONE) 25 MG tablet Take 1 tablet (25 mg total) by mouth 2 (two) times daily.  . traMADol (ULTRAM) 50 MG tablet Take 50 mg by mouth every 6 (six) hours as needed for moderate pain.     Allergies:   Cephalexin   Social History   Socioeconomic History  . Marital status: Single    Spouse name: Not on file  . Number of children: Not on file  . Years of education: Not on file  . Highest education level: Not on file  Occupational History  . Occupation: N/A    Employer: NOT EMPLOYED  Social Needs  . Financial resource strain: Not on file  . Food insecurity:    Worry: Not on file    Inability: Not on file  . Transportation needs:    Medical: Not on file    Non-medical: Not on file  Tobacco Use  . Smoking status: Never Smoker  . Smokeless tobacco: Never Used  Substance and Sexual Activity  . Alcohol use: No  . Drug use: No  . Sexual activity: Never    Birth control/protection: Post-menopausal  Lifestyle  . Physical activity:    Days per week: Not on file    Minutes per session: Not on file  . Stress: Not on file  Relationships  . Social connections:    Talks on phone: Not on file    Gets together: Not on file    Attends religious service: Not on file    Active member of club or organization: Not on file    Attends meetings of clubs or organizations: Not on file    Relationship status: Not on file  Other Topics Concern  . Not on file  Social History Narrative  . Not on file     Family History:  The patient's family history includes Atrial fibrillation in her mother; Breast  cancer in her cousin; Cancer in her mother; Diabetes in her maternal uncle; Hypertension in her mother.  ROS:   Please see the history of present illness.  All other systems are reviewed and otherwise negative.    PHYSICAL EXAM:   VS:  BP 122/64   Pulse 69   Ht 4\' 8"  (1.422 m)   Wt 126 lb (57.2 kg)   SpO2 97%   BMI 28.25 kg/m   BMI: Body mass index is 28.25 kg/m. GEN: Well nourished short statured WF, in no acute distress HEENT: blunted facial features, atraumatic Neck: no  JVD, carotid bruits, or masses Cardiac: RRR; very soft diastolic murmur RUSB. No rubs or gallops, no edema  Respiratory: clear to auscultation bilaterally, normal work of breathing GI: soft, nontender, nondistended, + BS MS: no deformity or atrophy Skin: warm and dry, no rash Neuro:  Alert and Oriented x 3, Strength and sensation are intact, follows commands, pleasant Psych: euthymic mood, full affect  Wt Readings from Last 3 Encounters:  07/15/17 126 lb (57.2 kg)  06/14/17 127 lb (57.6 kg)  06/09/17 127 lb 6.4 oz (57.8 kg)      Studies/Labs Reviewed:   EKG:  EKG was ordered today and personally reviewed by me and demonstrates NSR 69bpm with nonspecific ST abnormalities, QTc .  Recent Labs: 05/05/2017: ALT 23; Hemoglobin 14.2; Platelets 345 06/28/2017: BUN 28; Creatinine, Ser 0.89; Potassium 4.9; Sodium 136   Lipid Panel    Component Value Date/Time   CHOL 119 05/05/2017 1002   TRIG 80 05/05/2017 1002   HDL 49 05/05/2017 1002   CHOLHDL 2.4 05/05/2017 1002   CHOLHDL 6.3 08/29/2014 0800   VLDL 33 08/29/2014 0800   LDLCALC 54 05/05/2017 1002    Additional studies/ records that were reviewed today include: Summarized above   ASSESSMENT & PLAN:   1. Severe aortic insufficiency - per Dr. Harvie Bridge recommendation, plan right and left heart catheterization. Risks and benefits of cardiac catheterization have been discussed with the patient and mother. These include bleeding, infection, kidney  damage, stroke, heart attack, death. The patient understands these risks and is willing to proceed. On cardiac CT, ascending aorta was 3.5cm with abnormal appearing arch which was elongated and horizontal with pseudocoarctation tapering from 19 to 15 in its distal portion before descending. I reviewed with Dr. Elease Hashimoto whether she may need cardiac MRI at some point given aortic abnormality in the setting of Turner's disease. He would prefer to get Dr. Sharee Pimple input on this question before ordering. 2. Elevated calcium score - continue statin. Plan cath to determine coronary anatomy. Discussed holding Eliquis for 2 days prior to cath, and holding diuretics morning of procedure per standard protocol. Will check pre-cath labs. 3. Paroxysmal atrial fib - maintaining NSR. Continue Eliquis except hold 2 days prior to cath. Timing of resumption will be at the discretion of the cathing operator. 4. VSD - discussed with Dr. Elease Hashimoto. Possibly seen by TEE but not confirmed on cardiac CT. He feels that this can be assessed by her right heart cath. She does not really have a significant murmur on exam. 5. 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 Lungs are clear. Weight is stable. Most recent BUN was slightly elevated so should be OK for standard pre-cath fluid orders given that her weight in kg is only 57. Refill on potassium was requested today. However, most recent K in June was 4.9 while on spironolactone as well. Not clear that she really needs this - will d/c potassium for now but re-eval when BMET comes back if needed. 6. SVT - during TEE. No clinical recurrence noted by patient. Will check Mg, TSH, BMET with labs today  Disposition: F/u with myself or Dr. Florencia Reasons team after f/u with CVTS Dr. Laneta Simmers which is scheduled 8/1.   Medication Adjustments/Labs and Tests Ordered: Current  medicines are reviewed at length with the patient today.  Concerns regarding medicines are outlined above. Medication changes, Labs and Tests ordered today are  summarized above and listed in the Patient Instructions accessible in Encounters.   Signed, Laurann Montana, PA-C  07/15/2017 10:06 AM    [facility] [address] [place], [place], [place]  [address] Phone: [phone]; Fax: [phone]

## 2017-07-15 NOTE — H&P (View-Only) (Signed)
 Cardiology Office Note    Date:  07/15/2017  ID:  Porchea M Batres, DOB 08/07/1955, MRN [id number] PCP:  Bouska, David, MD  Cardiologist:  Philip Nahser, MD   Chief Complaint: discuss cath  History of Present Illness:  [name] is a 62 y.o. female with history of paroxysmal atrial fib, Turner's syndrome, aortic insufficiency, chronic diastolic CHF, OSA, HTN, HLD, hypothyroidism, stutter who presents to discuss cath. Recent workup has shown progression to severe AI. 2D echo 05/05/17 showed EF 55-60%, grade 1 DD, severe AI, moderate LAE. TEE 06/14/17 showed EF 50-55%, severe AI, mild MR.  The procedure was aborted early due to airway concerns and SVT which required cardioversion. There was also a small - moderate sized perimembranous VSD. Cardiac CT   Overread showed small pulm nodule in LUL statistically likely benign (radiology recommended f/u CT 12 months if high risk, no f/u if low risk). Calcium score 896 (99th percentile), poor quality coronary study due to AI, "Ascending Aortic root 3.5 cm with abnormal appearing arch which is elongated and horizontal with 'pseudo coarctation' tapering from 19 mm to 15 mm in it's distal portion before descending." No perimembranous VSD was noted. Labs 06/2017 K 4.9, Cr 0.99, BUN 28, 05/2017 Hgb 14.4, Plt 345, LFTs wnl, LDL 54.  Dr. Nahser recommended she come in today to discuss cath. She is here with her mother [name]. She is A+Ox3. The patient denies any complaints of CP, SOB, palpitations, bleeding or syncope. She has chronic edema R>L ever since teenager years felt due to Turner's syndrome. She seems to comprehend what we are telling her, and asks if this might lead to open heart surgery eventually. She is agreeable to proceed.   Past Medical History:  Diagnosis Date  . Aortic insufficiency    a. severe by echo, TEE 2019.  . Cervix abnormality 1993   MILD ATYPICAL ADENOMATOUS HYPERPLASIA OF CERVIX  . Chronic diastolic CHF (congestive heart failure)  (HCC) 08/29/2014  . Dermatitis 05-26-12   all over  since 9'13  . Difficult intubation    will plan on spinal anesthesia"small mouth/airway"  . Edema of both legs    a. right greater than left, due to Turner's syndrome.  . Hypertension   . Hypothyroidism   . PAF (paroxysmal atrial fibrillation) (HCC)   . Periorbital edema   . Speech disorder    occ. "stutter"  . Turner's syndrome   . VSD (ventricular septal defect)    a. ? by TEE 06/2017, not seen on cardiac CT.    Past Surgical History:  Procedure Laterality Date  . CARDIOVERSION  06/14/2017   Procedure: CARDIOVERSION;  Surgeon: Nahser, Philip J, MD;  Location: [facility];  Service: Cardiovascular;;  . CERVICAL CONE BIOPSY  1993  . COLPOSCOPY    . DILATION AND CURETTAGE OF UTERUS  1993   CONE BIOPSY  . HERNIA REPAIR    . JOINT REPLACEMENT     hip replacement  . TEE WITHOUT CARDIOVERSION N/A 06/14/2017   Procedure: TRANSESOPHAGEAL ECHOCARDIOGRAM (TEE);  Surgeon: Nahser, Philip J, MD;  Location: [facility];  Service: Cardiovascular;  Laterality: N/A;  . TOTAL HIP ARTHROPLASTY     LEFT  . TOTAL KNEE ARTHROPLASTY Right 05/31/2012   Procedure: RIGHT TOTAL KNEE ARTHROPLASTY;  Surgeon: Matthew D Olin, MD;  Location: [facility];  Service: Orthopedics;  Laterality: Right;  . US ECHOCARDIOGRAPHY  06/20/2009   EF 55-60%    Current Medications: Current Meds  Medication Sig  . acetaminophen (TYLENOL)   500 MG tablet Take 1,000 mg by mouth every 6 (six) hours as needed for moderate pain or headache.  . apixaban (ELIQUIS) 5 MG TABS tablet TAKE 1 TABLET BY MOUTH TWICE DAILY  . atorvastatin (LIPITOR) 20 MG tablet Take 1 tablet (20 mg total) by mouth daily.  . Cholecalciferol (VITAMIN D3) 2000 UNITS capsule Take 2,000 Units by mouth daily.  . famotidine (PEPCID) 40 MG tablet Take 40 mg by mouth 2 (two) times daily.  . furosemide (LASIX) 20 MG tablet TAKE 2 TABLETS BY MOUTH DAILY AND 1 TABLET EACH EVENING (Patient taking differently: Take 20-40  mg by mouth See admin instructions. TAKE 40 MG BY MOUTH DAILY AND 20 MG BY MOUTH EACH EVENING)  . levothyroxine (SYNTHROID, LEVOTHROID) 88 MCG tablet TAKE ONE TABLET BY MOUTH ONCE DAILY  . metoprolol tartrate (LOPRESSOR) 25 MG tablet TAKE 1 TABLET BY MOUTH TWICE DAILY  . potassium chloride (KLOR-CON M10) 10 MEQ tablet Take 1 tablet (10 mEq total) by mouth 2 (two) times daily.  . spironolactone (ALDACTONE) 25 MG tablet Take 1 tablet (25 mg total) by mouth 2 (two) times daily.  . traMADol (ULTRAM) 50 MG tablet Take 50 mg by mouth every 6 (six) hours as needed for moderate pain.     Allergies:   Cephalexin   Social History   Socioeconomic History  . Marital status: Single    Spouse name: Not on file  . Number of children: Not on file  . Years of education: Not on file  . Highest education level: Not on file  Occupational History  . Occupation: N/A    Employer: NOT EMPLOYED  Social Needs  . Financial resource strain: Not on file  . Food insecurity:    Worry: Not on file    Inability: Not on file  . Transportation needs:    Medical: Not on file    Non-medical: Not on file  Tobacco Use  . Smoking status: Never Smoker  . Smokeless tobacco: Never Used  Substance and Sexual Activity  . Alcohol use: No  . Drug use: No  . Sexual activity: Never    Birth control/protection: Post-menopausal  Lifestyle  . Physical activity:    Days per week: Not on file    Minutes per session: Not on file  . Stress: Not on file  Relationships  . Social connections:    Talks on phone: Not on file    Gets together: Not on file    Attends religious service: Not on file    Active member of club or organization: Not on file    Attends meetings of clubs or organizations: Not on file    Relationship status: Not on file  Other Topics Concern  . Not on file  Social History Narrative  . Not on file     Family History:  The patient's family history includes Atrial fibrillation in her mother; Breast  cancer in her cousin; Cancer in her mother; Diabetes in her maternal uncle; Hypertension in her mother.  ROS:   Please see the history of present illness.  All other systems are reviewed and otherwise negative.    PHYSICAL EXAM:   VS:  BP 122/64   Pulse 69   Ht 4' 8" (1.422 m)   Wt 126 lb (57.2 kg)   SpO2 97%   BMI 28.25 kg/m   BMI: Body mass index is 28.25 kg/m. GEN: Well nourished short statured WF, in no acute distress HEENT: blunted facial features, atraumatic Neck: no   JVD, carotid bruits, or masses Cardiac: RRR; very soft diastolic murmur RUSB. No rubs or gallops, no edema  Respiratory: clear to auscultation bilaterally, normal work of breathing GI: soft, nontender, nondistended, + BS MS: no deformity or atrophy Skin: warm and dry, no rash Neuro:  Alert and Oriented x 3, Strength and sensation are intact, follows commands, pleasant Psych: euthymic mood, full affect  Wt Readings from Last 3 Encounters:  07/15/17 126 lb (57.2 kg)  06/14/17 127 lb (57.6 kg)  06/09/17 127 lb 6.4 oz (57.8 kg)      Studies/Labs Reviewed:   EKG:  EKG was ordered today and personally reviewed by me and demonstrates NSR 69bpm with nonspecific ST abnormalities, QTc 432ms.  Recent Labs: 05/05/2017: ALT 23; Hemoglobin 14.2; Platelets 345 06/28/2017: BUN 28; Creatinine, Ser 0.89; Potassium 4.9; Sodium 136   Lipid Panel    Component Value Date/Time   CHOL 119 05/05/2017 1002   TRIG 80 05/05/2017 1002   HDL 49 05/05/2017 1002   CHOLHDL 2.4 05/05/2017 1002   CHOLHDL 6.3 08/29/2014 0800   VLDL 33 08/29/2014 0800   LDLCALC 54 05/05/2017 1002    Additional studies/ records that were reviewed today include: Summarized above   ASSESSMENT & PLAN:   1. Severe aortic insufficiency - per Dr. Nahser's recommendation, plan right and left heart catheterization. Risks and benefits of cardiac catheterization have been discussed with the patient and mother. These include bleeding, infection, kidney  damage, stroke, heart attack, death. The patient understands these risks and is willing to proceed. On cardiac CT, ascending aorta was 3.5cm with abnormal appearing arch which was elongated and horizontal with pseudocoarctation tapering from 19 to 15 in its distal portion before descending. I reviewed with Dr. Nahser whether she may need cardiac MRI at some point given aortic abnormality in the setting of Turner's disease. He would prefer to get Dr. Bartle's input on this question before ordering. 2. Elevated calcium score - continue statin. Plan cath to determine coronary anatomy. Discussed holding Eliquis for 2 days prior to cath, and holding diuretics morning of procedure per standard protocol. Will check pre-cath labs. 3. Paroxysmal atrial fib - maintaining NSR. Continue Eliquis except hold 2 days prior to cath. Timing of resumption will be at the discretion of the cathing operator. 4. VSD - discussed with Dr. Nahser. Possibly seen by TEE but not confirmed on cardiac CT. He feels that this can be assessed by her right heart cath. She does not really have a significant murmur on exam. 5. 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 Lungs are clear. Weight is stable. Most recent BUN was slightly elevated so should be OK for standard pre-cath fluid orders given that her weight in kg is only 57. Refill on potassium was requested today. However, most recent K in June was 4.9 while on spironolactone as well. Not clear that she really needs this - will d/c potassium for now but re-eval when BMET comes back if needed. 6. SVT - during TEE. No clinical recurrence noted by patient. Will check Mg, TSH, BMET with labs today  Disposition: F/u with myself or Dr. Nahser/care team after f/u with CVTS Dr. Bartle which is scheduled 8/1.   Medication Adjustments/Labs and Tests Ordered: Current  medicines are reviewed at length with the patient today.  Concerns regarding medicines are outlined above. Medication changes, Labs and Tests ordered today are   summarized above and listed in the Patient Instructions accessible in Encounters.   Signed, Leighana Neyman N Shwanda Soltis, PA-C  07/15/2017 10:06 AM    [facility] [address], [place], [place]  [address] Phone: [phone]; Fax: [phone]  

## 2017-07-15 NOTE — Telephone Encounter (Signed)
New Message   Pt's mother is calling, wanting to know what time she needs to be at the hospital for the cath. Please call

## 2017-07-15 NOTE — Telephone Encounter (Signed)
Reviewed the cath start time with the patient and reviewed lab results. Discussed the Potassium dosage change. Patient expressed understanding.

## 2017-07-15 NOTE — Patient Instructions (Signed)
Medication Instructions:  Your physician has recommended you make the following change in your medication:  STOP: Potassium supplement   If you need a refill on your cardiac medications, please contact your pharmacy first.  Labwork: Today for kidney function test, magnesium, complete blood count and thyroid level   Testing/Procedures: Your physician has requested that you have a cardiac catheterization. Cardiac catheterization is used to diagnose and/or treat various heart conditions. Doctors may recommend this procedure for a number of different reasons. The most common reason is to evaluate chest pain. Chest pain can be a symptom of coronary artery disease (CAD), and cardiac catheterization can show whether plaque is narrowing or blocking your heart's arteries. This procedure is also used to evaluate the valves, as well as measure the blood flow and oxygen levels in different parts of your heart. For further information please visit [email or web address] Please follow instruction sheet, as given.  Follow-Up: Your physician recommends that you schedule a follow-up appointment with Ronie Spiesayna Dunn, PA if possible or Dr. Elease HashimotoNahser or a provider on his team after you see Dr. Laneta SimmersBartle on 8/1   Any Other Special Instructions Will Be Listed Below (If Applicable).   Thank you for choosing [facility]    [phone]  If you need a refill on your cardiac medications before your next appointment, please call your pharmacy.

## 2017-07-16 ENCOUNTER — Telehealth: Payer: Self-pay | Admitting: Cardiovascular Disease

## 2017-07-16 NOTE — Telephone Encounter (Signed)
New message    Patient calling she has questions on her daughter pre cath procedure , should she be holding the aspirin or taking them as normal

## 2017-07-16 NOTE — Telephone Encounter (Signed)
Spoke with patient's mother and advised that per Dr. Elease HashimotoNahser, patient should take Aspirin 81 mg on the morning of the cath only and then d/c. She verbalized understanding and agreement and thanked me for the call.

## 2017-07-19 ENCOUNTER — Encounter: Payer: Medicaid Other | Admitting: Surgery

## 2017-07-19 ENCOUNTER — Telehealth: Payer: Self-pay | Admitting: *Deleted

## 2017-07-19 NOTE — Telephone Encounter (Signed)
Pt contacted [facility] for: Tuesday July 20, 2017 9 AM Ver[facility] at: 7 AM  No solid food after midnight prior to cath, clear liquids until 5 AM day of procedure. Verified allergies in Epic  Hold: Apixaban-07/18/17 until post procedure Furosemide AM of procedure KCl AM of procedure Spironolactone AM of procedure  Except hold medications AM meds can be  taken pre-cath with sip of water including: ASA 81 mg  Confirmed patient has responsible person to drive home post procedure and for 24 hours after you arrive home: yes

## 2017-07-20 ENCOUNTER — Ambulatory Visit (HOSPITAL_COMMUNITY)
Admission: RE | Admit: 2017-07-20 | Discharge: 2017-07-20 | Disposition: A | Discharge status: Home / Self Care | Payer: Medicaid Other | Source: Ambulatory Visit | Attending: Interventional Cardiology | Admitting: Interventional Cardiology

## 2017-07-20 ENCOUNTER — Encounter (HOSPITAL_COMMUNITY): Payer: Self-pay | Admitting: Interventional Cardiology

## 2017-07-20 ENCOUNTER — Encounter (HOSPITAL_COMMUNITY)
Admission: RE | Disposition: A | Discharge status: Home / Self Care | Payer: Self-pay | Source: Ambulatory Visit | Attending: Interventional Cardiology

## 2017-07-20 DIAGNOSIS — Q210 Ventricular septal defect: Secondary | ICD-10-CM | POA: Diagnosis not present

## 2017-07-20 DIAGNOSIS — E039 Hypothyroidism, unspecified: Secondary | ICD-10-CM | POA: Insufficient documentation

## 2017-07-20 DIAGNOSIS — G4733 Obstructive sleep apnea (adult) (pediatric): Secondary | ICD-10-CM | POA: Diagnosis not present

## 2017-07-20 DIAGNOSIS — I5032 Chronic diastolic (congestive) heart failure: Secondary | ICD-10-CM | POA: Diagnosis not present

## 2017-07-20 DIAGNOSIS — I2510 Atherosclerotic heart disease of native coronary artery without angina pectoris: Secondary | ICD-10-CM | POA: Insufficient documentation

## 2017-07-20 DIAGNOSIS — J9612 Chronic respiratory failure with hypercapnia: Secondary | ICD-10-CM | POA: Diagnosis present

## 2017-07-20 DIAGNOSIS — Q969 Turner's syndrome, unspecified: Secondary | ICD-10-CM | POA: Diagnosis not present

## 2017-07-20 DIAGNOSIS — Z7901 Long term (current) use of anticoagulants: Secondary | ICD-10-CM | POA: Insufficient documentation

## 2017-07-20 DIAGNOSIS — I351 Nonrheumatic aortic (valve) insufficiency: Secondary | ICD-10-CM | POA: Diagnosis not present

## 2017-07-20 DIAGNOSIS — I480 Paroxysmal atrial fibrillation: Secondary | ICD-10-CM | POA: Diagnosis not present

## 2017-07-20 DIAGNOSIS — I350 Nonrheumatic aortic (valve) stenosis: Secondary | ICD-10-CM

## 2017-07-20 DIAGNOSIS — I110 Hypertensive heart disease with heart failure: Secondary | ICD-10-CM | POA: Diagnosis not present

## 2017-07-20 DIAGNOSIS — I119 Hypertensive heart disease without heart failure: Secondary | ICD-10-CM | POA: Diagnosis present

## 2017-07-20 DIAGNOSIS — I352 Nonrheumatic aortic (valve) stenosis with insufficiency: Secondary | ICD-10-CM | POA: Insufficient documentation

## 2017-07-20 DIAGNOSIS — I471 Supraventricular tachycardia: Secondary | ICD-10-CM | POA: Diagnosis not present

## 2017-07-20 DIAGNOSIS — E785 Hyperlipidemia, unspecified: Secondary | ICD-10-CM | POA: Diagnosis not present

## 2017-07-20 HISTORY — PX: ULTRASOUND GUIDANCE FOR VASCULAR ACCESS: SHX6516

## 2017-07-20 HISTORY — PX: RIGHT/LEFT HEART CATH AND CORONARY ANGIOGRAPHY: CATH118266

## 2017-07-20 LAB — POCT I-STAT 3, VENOUS BLOOD GAS (G3P V)
Acid-Base Excess: 1 mmol/L (ref 0.0–2.0)
Acid-Base Excess: 1 mmol/L (ref 0.0–2.0)
Bicarbonate: 25.4 mmol/L (ref 20.0–28.0)
Bicarbonate: 25.8 mmol/L (ref 20.0–28.0)
Bicarbonate: 26.6 mmol/L (ref 20.0–28.0)
Bicarbonate: 26.7 mmol/L (ref 20.0–28.0)
O2 SAT: 70 %
O2 Saturation: 68 %
O2 Saturation: 69 %
O2 Saturation: 69 %
PCO2 VEN: 44.4 mmHg (ref 44.0–60.0)
PCO2 VEN: 47.8 mmHg (ref 44.0–60.0)
PH VEN: 7.365 (ref 7.250–7.430)
PH VEN: 7.37 (ref 7.250–7.430)
PH VEN: 7.373 (ref 7.250–7.430)
PO2 VEN: 37 mmHg (ref 32.0–45.0)
TCO2: 27 mmol/L (ref 22–32)
TCO2: 27 mmol/L (ref 22–32)
TCO2: 28 mmol/L (ref 22–32)
TCO2: 28 mmol/L (ref 22–32)
pCO2, Ven: 44.3 mmHg (ref 44.0–60.0)
pCO2, Ven: 46.1 mmHg (ref 44.0–60.0)
pH, Ven: 7.355 (ref 7.250–7.430)
pO2, Ven: 37 mmHg (ref 32.0–45.0)
pO2, Ven: 38 mmHg (ref 32.0–45.0)
pO2, Ven: 38 mmHg (ref 32.0–45.0)

## 2017-07-20 LAB — POCT I-STAT 3, ART BLOOD GAS (G3+)
ACID-BASE EXCESS: 1 mmol/L (ref 0.0–2.0)
Acid-base deficit: 1 mmol/L (ref 0.0–2.0)
Bicarbonate: 24.9 mmol/L (ref 20.0–28.0)
Bicarbonate: 25.3 mmol/L (ref 20.0–28.0)
O2 Saturation: 92 %
O2 Saturation: 98 %
PCO2 ART: 39.1 mmHg (ref 32.0–48.0)
PCO2 ART: 44.4 mmHg (ref 32.0–48.0)
PH ART: 7.356 (ref 7.350–7.450)
PH ART: 7.418 (ref 7.350–7.450)
PO2 ART: 111 mmHg — AB (ref 83.0–108.0)
TCO2: 26 mmol/L (ref 22–32)
TCO2: 26 mmol/L (ref 22–32)
pO2, Arterial: 67 mmHg — ABNORMAL LOW (ref 83.0–108.0)

## 2017-07-20 SURGERY — RIGHT/LEFT HEART CATH AND CORONARY ANGIOGRAPHY
Anesthesia: LOCAL

## 2017-07-20 MED ORDER — ASPIRIN 81 MG PO CHEW
CHEWABLE_TABLET | ORAL | Status: AC
  Administered 2017-07-20: 81 mg via ORAL
  Filled 2017-07-20: qty 1

## 2017-07-20 MED ORDER — ASPIRIN 81 MG PO CHEW
81.0000 mg | CHEWABLE_TABLET | ORAL | Status: AC
  Administered 2017-07-20: 81 mg via ORAL

## 2017-07-20 MED ORDER — SODIUM CHLORIDE 0.9 % WEIGHT BASED INFUSION
3.0000 mL/kg/h | INTRAVENOUS | Status: AC
  Administered 2017-07-20: 3 mL/kg/h via INTRAVENOUS

## 2017-07-20 MED ORDER — LIDOCAINE HCL (PF) 1 % IJ SOLN
INTRAMUSCULAR | Status: DC | PRN
  Administered 2017-07-20: 2 mL
  Administered 2017-07-20: 1 mL

## 2017-07-20 MED ORDER — ONDANSETRON HCL 4 MG/2ML IJ SOLN: 4.0000 mg | Freq: Four times a day (QID) | INTRAMUSCULAR | Status: DC | PRN

## 2017-07-20 MED ORDER — FENTANYL CITRATE (PF) 100 MCG/2ML IJ SOLN
INTRAMUSCULAR | Status: DC | PRN
  Administered 2017-07-20: 25 ug via INTRAVENOUS

## 2017-07-20 MED ORDER — VERAPAMIL HCL 2.5 MG/ML IV SOLN
INTRAVENOUS | Status: DC | PRN
  Administered 2017-07-20: 10 mL via INTRA_ARTERIAL

## 2017-07-20 MED ORDER — HEPARIN SODIUM (PORCINE) 1000 UNIT/ML IJ SOLN
INTRAMUSCULAR | Status: AC
  Filled 2017-07-20: qty 1

## 2017-07-20 MED ORDER — SODIUM CHLORIDE 0.9 % IV SOLN: 250.0000 mL | INTRAVENOUS | Status: DC | PRN

## 2017-07-20 MED ORDER — VERAPAMIL HCL 2.5 MG/ML IV SOLN
INTRAVENOUS | Status: AC
  Filled 2017-07-20: qty 2

## 2017-07-20 MED ORDER — HEPARIN (PORCINE) IN NACL 1000-0.9 UT/500ML-% IV SOLN
INTRAVENOUS | Status: DC | PRN
  Administered 2017-07-20 (×2): 500 mL

## 2017-07-20 MED ORDER — HEPARIN (PORCINE) IN NACL 1000-0.9 UT/500ML-% IV SOLN
INTRAVENOUS | Status: AC
  Filled 2017-07-20: qty 1000

## 2017-07-20 MED ORDER — SODIUM CHLORIDE 0.9 % WEIGHT BASED INFUSION: 1.0000 mL/kg/h | INTRAVENOUS | Status: DC

## 2017-07-20 MED ORDER — ACETAMINOPHEN 325 MG PO TABS: 650.0000 mg | ORAL_TABLET | ORAL | Status: DC | PRN

## 2017-07-20 MED ORDER — LIDOCAINE HCL (PF) 1 % IJ SOLN
INTRAMUSCULAR | Status: AC
  Filled 2017-07-20: qty 30

## 2017-07-20 MED ORDER — IOHEXOL 350 MG/ML SOLN
INTRAVENOUS | Status: DC | PRN
  Administered 2017-07-20: 100 mL via INTRAVENOUS

## 2017-07-20 MED ORDER — MIDAZOLAM HCL 2 MG/2ML IJ SOLN
INTRAMUSCULAR | Status: DC | PRN
  Administered 2017-07-20: 0.5 mg via INTRAVENOUS

## 2017-07-20 MED ORDER — SODIUM CHLORIDE 0.9% FLUSH: 3.0000 mL | INTRAVENOUS | Status: DC | PRN

## 2017-07-20 MED ORDER — ASPIRIN 81 MG PO CHEW: 81.0000 mg | CHEWABLE_TABLET | Freq: Every day | ORAL | Status: DC

## 2017-07-20 MED ORDER — OXYCODONE HCL 5 MG PO TABS: 5.0000 mg | ORAL_TABLET | ORAL | Status: DC | PRN

## 2017-07-20 MED ORDER — HEPARIN SODIUM (PORCINE) 1000 UNIT/ML IJ SOLN
INTRAMUSCULAR | Status: DC | PRN
  Administered 2017-07-20: 3000 [IU] via INTRAVENOUS

## 2017-07-20 MED ORDER — FENTANYL CITRATE (PF) 100 MCG/2ML IJ SOLN
INTRAMUSCULAR | Status: AC
  Filled 2017-07-20: qty 2

## 2017-07-20 MED ORDER — SODIUM CHLORIDE 0.9 % IV SOLN: INTRAVENOUS | Status: DC

## 2017-07-20 MED ORDER — SODIUM CHLORIDE 0.9% FLUSH: 3.0000 mL | Freq: Two times a day (BID) | INTRAVENOUS | Status: DC

## 2017-07-20 MED ORDER — MIDAZOLAM HCL 2 MG/2ML IJ SOLN
INTRAMUSCULAR | Status: AC
  Filled 2017-07-20: qty 2

## 2017-07-20 SURGICAL SUPPLY — 18 items
CATH BALLN WEDGE 5F 110CM (CATHETERS) ×3 IMPLANT
CATH INFINITI 5 FR JL3.5 (CATHETERS) ×3 IMPLANT
CATH INFINITI 5FR ANG PIGTAIL (CATHETERS) ×3 IMPLANT
CATH INFINITI JR4 5F (CATHETERS) ×3 IMPLANT
COVER PRB 48X5XTLSCP FOLD TPE (BAG) ×2 IMPLANT
COVER PROBE 5X48 (BAG) ×1
DEVICE RAD TR BAND REGULAR (VASCULAR PRODUCTS) ×3 IMPLANT
GLIDESHEATH SLEND A-KIT 6F 22G (SHEATH) ×3 IMPLANT
GUIDEWIRE .025 260CM (WIRE) ×3 IMPLANT
GUIDEWIRE INQWIRE 1.5J.035X260 (WIRE) ×2 IMPLANT
INQWIRE 1.5J .035X260CM (WIRE) ×3
KIT HEART LEFT (KITS) ×3 IMPLANT
PACK CARDIAC CATHETERIZATION (CUSTOM PROCEDURE TRAY) ×3 IMPLANT
SHEATH GLIDE SLENDER 4/5FR (SHEATH) ×3 IMPLANT
SYR MEDRAD MARK V 150ML (SYRINGE) ×3 IMPLANT
TRANSDUCER W/STOPCOCK (MISCELLANEOUS) ×3 IMPLANT
TUBING CIL FLEX 10 FLL-RA (TUBING) ×3 IMPLANT
WIRE HI TORQ VERSACORE-J 145CM (WIRE) ×3 IMPLANT

## 2017-07-20 NOTE — Interval H&P Note (Signed)
Cath Lab Visit (complete for each Cath Lab visit)  Clinical Evaluation Leading to the Procedure:   ACS: No.  Non-ACS:    Anginal Classification: CCS III  Anti-ischemic medical therapy: Minimal Therapy (1 class of medications)  Non-Invasive Test Results: No non-invasive testing performed  Prior CABG: No previous CABG      History and Physical Interval Note:  07/20/2017 7:31 AM  [name]  has presented today for surgery, with the diagnosis of AI, ASD  The various methods of treatment have been discussed with the patient and family. After consideration of risks, benefits and other options for treatment, the patient has consented to  Procedure(s): RIGHT/LEFT HEART CATH AND CORONARY ANGIOGRAPHY (N/A) as a surgical intervention .  The patient's history has been reviewed, patient examined, no change in status, stable for surgery.  I have reviewed the patient's chart and labs.  Questions were answered to the patient's satisfaction.     Lyn RecordsHenry W Caiden Monsivais III

## 2017-07-20 NOTE — Discharge Instructions (Signed)
Drink plenty of fluids and keep right arm at or above heart level for 24 hours   Moderate Conscious Sedation, Adult, Care After These instructions provide you with information about caring for yourself after your procedure. Your health care provider may also give you more specific instructions. Your treatment has been planned according to current medical practices, but problems sometimes occur. Call your health care provider if you have any problems or questions after your procedure. What can I expect after the procedure? After your procedure, it is common:  To feel sleepy for several hours.  To feel clumsy and have poor balance for several hours.  To have poor judgment for several hours.  To vomit if you eat too soon.  Follow these instructions at home: For at least 24 hours after the procedure:   Do not: ? Participate in activities where you could fall or become injured. ? Drive. ? Use heavy machinery. ? Drink alcohol. ? Take sleeping pills or medicines that cause drowsiness. ? Make important decisions or sign legal documents. ? Take care of children on your own.  Rest. Eating and drinking  Follow the diet recommended by your health care provider.  If you vomit: ? Drink water, juice, or soup when you can drink without vomiting. ? Make sure you have little or no nausea before eating solid foods. General instructions  Have a responsible adult stay with you until you are awake and alert.  Take over-the-counter and prescription medicines only as told by your health care provider.  If you smoke, do not smoke without supervision.  Keep all follow-up visits as told by your health care provider. This is important. Contact a health care provider if:  You keep feeling nauseous or you keep vomiting.  You feel light-headed.  You develop a rash.  You have a fever. Get help right away if:  You have trouble breathing. This information is not intended to replace advice  given to you by your health care provider. Make sure you discuss any questions you have with your health care provider. Document Released: 10/12/2012 Document Revised: 05/27/2015 Document Reviewed: 04/13/2015 Elsevier Interactive Patient Education  2018 [employer]  Radial Site Care Refer to this sheet in the next few weeks. These instructions provide you with information about caring for yourself after your procedure. Your health care provider may also give you more specific instructions. Your treatment has been planned according to current medical practices, but problems sometimes occur. Call your health care provider if you have any problems or questions after your procedure. What can I expect after the procedure? After your procedure, it is typical to have the following:  Bruising at the radial site that usually fades within 1-2 weeks.  Blood collecting in the tissue (hematoma) that may be painful to the touch. It should usually decrease in size and tenderness within 1-2 weeks.  Follow these instructions at home:  Take medicines only as directed by your health care provider.  You may shower 24-48 hours after the procedure or as directed by your health care provider. Remove the bandage (dressing) and gently wash the site with plain soap and water. Pat the area dry with a clean towel. Do not rub the site, because this may cause bleeding.  Do not take baths, swim, or use a hot tub until your health care provider approves.  Check your insertion site every day for redness, swelling, or drainage.  Do not apply powder or lotion to the site.  Do not flex  or bend the affected arm for 24 hours or as directed by your health care provider.  Do not push or pull heavy objects with the affected arm for 24 hours or as directed by your health care provider.  Do not lift over 10 lb (4.5 kg) for 5 days after your procedure or as directed by your health care provider.  Ask your health care provider  when it is okay to: ? Return to work or school. ? Resume usual physical activities or sports. ? Resume sexual activity.  Do not drive home if you are discharged the same day as the procedure. Have someone else drive you.  You may drive 24 hours after the procedure unless otherwise instructed by your health care provider.  Do not operate machinery or power tools for 24 hours after the procedure.  If your procedure was done as an outpatient procedure, which means that you went home the same day as your procedure, a responsible adult should be with you for the first 24 hours after you arrive home.  Keep all follow-up visits as directed by your health care provider. This is important. Contact a health care provider if:  You have a fever.  You have chills.  You have increased bleeding from the radial site. Hold pressure on the site. Get help right away if:  You have unusual pain at the radial site.  You have redness, warmth, or swelling at the radial site.  You have drainage (other than a small amount of blood on the dressing) from the radial site.  The radial site is bleeding, and the bleeding does not stop after 30 minutes of holding steady pressure on the site.  Your arm or hand becomes pale, cool, tingly, or numb. This information is not intended to replace advice given to you by your health care provider. Make sure you discuss any questions you have with your health care provider. Document Released: 01/24/2010 Document Revised: 05/30/2015 Document Reviewed: 07/10/2013 Elsevier Interactive Patient Education  2018 [employer]

## 2017-07-21 ENCOUNTER — Telehealth: Payer: Self-pay | Admitting: Cardiovascular Disease

## 2017-07-21 NOTE — Telephone Encounter (Signed)
New message    Patient's mother calling, wants to know when patient should resume Eliquis, s/p heart cath

## 2017-07-21 NOTE — Telephone Encounter (Signed)
Pt's mother wanted to know when daughter can start taking the Eliquis 5 mg medication . On the preop notes states that pt can hold this medication until post procedure. Pt's Mother is aware that pt can start this medication today. Mother verbalized understanding.

## 2017-07-23 ENCOUNTER — Other Ambulatory Visit: Payer: Self-pay | Admitting: Cardiovascular Disease

## 2017-07-23 NOTE — Telephone Encounter (Signed)
Pt last saw [name] 07/15/17, last labs 07/15/17 Creat 0.92, age 62, weight 57.2kg, based on specified criteria pt is on appropriate dosage of Eliquis 5mg  BID.  Will refill rx.

## 2017-07-29 ENCOUNTER — Encounter (HOSPITAL_COMMUNITY): Payer: Self-pay | Admitting: Interventional Cardiology

## 2017-08-05 ENCOUNTER — Encounter: Payer: Medicaid Other | Admitting: Surgery

## 2017-08-09 ENCOUNTER — Other Ambulatory Visit: Payer: Self-pay

## 2017-08-09 ENCOUNTER — Ambulatory Visit: Payer: Medicaid Other | Admitting: Surgery

## 2017-08-09 ENCOUNTER — Encounter: Payer: Self-pay | Admitting: Surgery

## 2017-08-09 VITALS — BP 90/60 | HR 111 | Resp 18 | Ht <= 58 in | Wt 126.2 lb

## 2017-08-09 DIAGNOSIS — I351 Nonrheumatic aortic (valve) insufficiency: Secondary | ICD-10-CM

## 2017-08-09 DIAGNOSIS — Z1231 Encounter for screening mammogram for malignant neoplasm of breast: Secondary | ICD-10-CM

## 2017-08-09 NOTE — Progress Notes (Signed)
Cardiothoracic Surgery Consultation  PCP is Tracey Harries, MD Referring Provider is Nahser, Deloris Ping, MD  Chief Complaint  Patient presents with  . Aortic Insuffiency    new patient consult, TEE 06/14/17, CATH 07/20/17  . Ventricular Septal Defect    HPI:  The patient is a 62 year old woman with a history of Turner syndrome, hypertension, hypothyroidism, paroxysmal atrial fibrillation, SVT, chronic diastolic congestive heart failure, obstructive sleep apnea, degenerative arthritis status post left hip and right knee replacements, and aortic insufficiency that has been followed by Dr. Elease Hashimoto.  She had a follow-up echocardiogram on 05/05/2017 which showed a trileaflet aortic valve with mildly calcified leaflets.  The mean gradient across aortic valve was 15 mmHg.  Dimensionless index was 0.45.  There was felt to be severe regurgitation with an AI pressure half-time of 417 ms.  Left ventricular ejection fraction was 55 to 60% with grade 1 diastolic dysfunction.  The left ventricular internal dimension during diastole was 45.7 mm and the systolic dimension was 31.9 mm.  She subsequently had a transesophageal echo on 06/14/2017 which reportedly was stopped early due to some respiratory compromise and SVT requiring cardioversion.  It showed the ejection fraction was 50 to 55%.  There was felt to be severe regurgitation although I do not see any objective measurements.  There is also question of a small to moderate sized perimembranous VSD.  She subsequently had a gated cardiac CTA on 07/09/2017 which showed a coronary calcium score of 826 which was in the 99th percentile for her age and sex.  It was a poor quality coronary study.  The aortic valve is trileaflet.  There is no perimembranous VSD.  The aortic root and ascending aortic diameter was measured at 3.5 cm.  The aortic arch was elongated and horizontal and tapered from 19 mm to 15 mm distally.  She subsequently underwent cardiac catheterization on  07/20/2017 which showed an ejection fraction of 50%.  LVEDP was 18 mmHg.  Her normal pulmonary artery pressures.  There is no left right shunt identified by oximetry run.  There was moderate nonobstructive coronary disease in the proximal and mid left circumflex.  There is angiographically severe AI.  The patient is here with her mother today who she has lived with her whole life.  She does not work and has reduced mobility due to her degenerative arthritis with left hip and right knee replacements.  She uses a walker whenever she is out of the house but can walk around inside her house without it.  She denies any exertional shortness of breath or fatigue.  She has occasional palpitations.  She has chronic edema in her right lower leg for most of her life but has had no swelling in her left leg.  She does report some episodes of "gagging" which has been followed by a gastroenterologist.  Past Medical History:  Diagnosis Date  . Adiposity 06/04/2011  . Adult hypothyroidism 06/04/2011  . Aortic insufficiency    a. severe by echo, TEE 2019.  Marland Kitchen Benign hypertensive heart disease without heart failure 10/16/2010  . Cervix abnormality 1993   MILD ATYPICAL ADENOMATOUS HYPERPLASIA OF CERVIX  . Chronic diastolic CHF (congestive heart failure) (HCC) 08/29/2014  . Chronic respiratory failure with hypercapnia (HCC) 07/06/2012   Followed in Pulmonary clinic/  Healthcare  - 05/2012 > 2lpm started at [facility] with HCO3 33-35 on bmets c/w hypercarbia. - PSS 08/29/12 Severe obstructive sleep apnea/hypopnea syndrome, with an AHI of 79 > see OSA - Placed on  24h 02 3lpm at d/c 09/04/14 - stopped 12/2014     . Dermatitis 05-26-12   all over  since 9'13  . Difficult intubation    will plan on spinal anesthesia"small mouth/airway"  . Dyspnea   . Edema of both legs    a. right greater than left, due to Turner's syndrome.  . Gagging episode   . H/O total hip arthroplasty 06/04/2011  . Hyperglycemia 12/23/2011  .  Hypertension   . Hypothyroidism   . LAD (lymphadenopathy), mediastinal 08/30/2014  . Obesity 09/16/2014  . Orbital floor (blow-out) closed fracture (HCC) 08/17/2011  . OSA (obstructive sleep apnea) 09/10/2012   NPSG 08/29/12:  Severe obstructive sleep apnea/hypopnea syndrome, with an AHI of 79  06/2016 Bipap 21/17  . Osteoarthritis 10/16/2010   Overview:  Dr. Charlann Boxer (hips and knees)   . PAF (paroxysmal atrial fibrillation) (HCC)   . Periorbital edema   . 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 Severe aortic insufficiency   . Speech disorder    occ. "stutter"  . Supraventricular tachycardia (HCC) 08/27/2014  . Turner syndrome 06/04/2011  . Turner's syndrome   . Ventricular septal defect   . VSD (ventricular septal defect)    a. ? by TEE 06/2017, not seen on cardiac CT.    Past Surgical History:  Procedure Laterality Date  . CARDIOVERSION  06/14/2017   Procedure: CARDIOVERSION;  Surgeon: Elease Hashimoto Deloris Ping, MD;  Location: [facility] ENDOSCOPY;  Service: Cardiovascular;;  . CERVICAL CONE BIOPSY  1993  . COLPOSCOPY    . DILATION AND CURETTAGE OF UTERUS  1993   CONE BIOPSY  . HERNIA REPAIR    . JOINT REPLACEMENT     hip replacement  . RIGHT/LEFT HEART CATH AND CORONARY ANGIOGRAPHY N/A 07/20/2017   Procedure: RIGHT/LEFT HEART CATH AND CORONARY ANGIOGRAPHY;  Surgeon: Lyn Records, MD;  Location: [facility] INVASIVE CV LAB;  Service: Cardiovascular;  Laterality: N/A;  . TEE WITHOUT CARDIOVERSION N/A 06/14/2017   Procedure: TRANSESOPHAGEAL ECHOCARDIOGRAM (TEE);  Surgeon: Elease Hashimoto Deloris Ping, MD;  Location: [facility] ENDOSCOPY;  Service: Cardiovascular;  Laterality: N/A;  . TOTAL HIP ARTHROPLASTY     LEFT  . TOTAL KNEE ARTHROPLASTY Right 05/31/2012   Procedure: RIGHT TOTAL KNEE ARTHROPLASTY;  Surgeon: Shelda Pal, MD;  Location: [facility] ORS;  Service: Orthopedics;  Laterality: Right;  . ULTRASOUND GUIDANCE FOR VASCULAR ACCESS  07/20/2017   Procedure: Ultrasound Guidance For Vascular Access;  Surgeon: Lyn Records, MD;  Location: [facility]  INVASIVE CV LAB;  Service: Cardiovascular;;  . US ECHOCARDIOGRAPHY  06/20/2009   EF 55-60%    Family History  Problem Relation Age of Onset  . Hypertension Mother   . Cancer Mother        SKIN CANCER  . Atrial fibrillation Mother   . Breast cancer Cousin        MATERNAL COUSIN  . Diabetes Maternal Uncle     Social History Social History   Tobacco Use  . Smoking status: Never Smoker  . Smokeless tobacco: Never Used  Substance Use Topics  . Alcohol use: No  . Drug use: No    Current Outpatient Medications  Medication Sig Dispense Refill  . acetaminophen (TYLENOL) 500 MG tablet Take 1,000 mg by mouth every 6 (six) hours as needed for moderate pain or headache.    Marland Kitchen atorvastatin (LIPITOR) 20 MG tablet Take 1 tablet (20 mg total) by mouth daily. 30 tablet 11  . Cholecalciferol (VITAMIN D3) 2000 UNITS capsule Take 2,000 Units by mouth daily.    Marland Kitchen  ELIQUIS 5 MG TABS tablet TAKE 1 TABLET BY MOUTH TWICE DAILY 60 tablet 10  . famotidine (PEPCID) 40 MG tablet Take 40 mg by mouth 2 (two) times daily.    . furosemide (LASIX) 20 MG tablet TAKE 2 TABLETS BY MOUTH DAILY AND 1 TABLET EACH EVENING (Patient taking differently: Take 20-40 mg by mouth See admin instructions. TAKE 40 MG BY MOUTH DAILY AND 20 MG BY MOUTH EACH EVENING) 90 tablet 11  . levothyroxine (SYNTHROID, LEVOTHROID) 88 MCG tablet TAKE ONE TABLET BY MOUTH ONCE DAILY    . metoprolol tartrate (LOPRESSOR) 25 MG tablet TAKE 1 TABLET BY MOUTH TWICE DAILY 180 tablet 3  . potassium chloride (K-DUR) 10 MEQ tablet Take 1 tablet (10 mEq total) by mouth daily. 30 tablet 2  . spironolactone (ALDACTONE) 25 MG tablet Take 1 tablet (25 mg total) by mouth 2 (two) times daily. 60 tablet 11  . traMADol (ULTRAM) 50 MG tablet Take 50 mg by mouth every 6 (six) hours as needed for moderate pain.      No current facility-administered medications for this visit.     Allergies  Allergen Reactions  . Cephalexin Rash and Other (See Comments)     Prefers not to have ? Psoriasis skin condition    Review of Systems  Constitutional: Negative for activity change, appetite change and fatigue.       Has lost some weight since being on a low-sodium diet with her mother.  HENT: Positive for hearing loss.        Last saw her dentist 2 years ago.  Eyes: Negative.   Respiratory: Negative for chest tightness and shortness of breath.   Cardiovascular: Positive for palpitations and leg swelling. Negative for chest pain.       Chronic in right lower extremity only  Gastrointestinal:       Gagging of unclear etiology but no vomiting. Reflux and hiatal hernia.  Genitourinary: Negative.   Musculoskeletal: Positive for arthralgias and gait problem.  Allergic/Immunologic: Negative.   Hematological: Bruises/bleeds easily.  Psychiatric/Behavioral: Negative.     BP 90/60 (BP Location: Right Arm, Patient Position: Sitting, Cuff Size: Small)   Pulse (!) 111   Resp 18   Ht 4\' 8"  (1.422 m)   Wt 126 lb 3.2 oz (57.2 kg)   SpO2 99% Comment: RA  BMI 28.29 kg/m  Physical Exam  Constitutional: She is oriented to person, place, and time. She appears well-developed and well-nourished. No distress.  HENT:  Mouth/Throat: Oropharynx is clear and moist.  Turner syndrome facies with web neck  Eyes: Pupils are equal, round, and reactive to light. EOM are normal.  Neck: Normal range of motion. Neck supple. No JVD present. No thyromegaly present.  Cardiovascular: Normal rate and regular rhythm.  Murmur heard. 2/6 diastolic murmur LLSB  Pulmonary/Chest: Effort normal and breath sounds normal.  Abdominal: Soft. Bowel sounds are normal. She exhibits no distension. There is no tenderness.  Musculoskeletal:  Mild edema RLE  Lymphadenopathy:    She has no cervical adenopathy.  Neurological: She is alert and oriented to person, place, and time.  Skin: Skin is warm and dry.  Psychiatric: She has a normal mood and affect.     Diagnostic Tests:  Result  status: Final result                           [facility]*  [address]                        [place], [place] [address]                            [phone]  ------------------------------------------------------------------- Transthoracic Echocardiography  Patient:    [name], [name] MR #:       [id number] Study Date: 05/05/2017 Gender:     F Age:        72 Height:     142.2 cm Weight:     60.2 kg BSA:        1.57 m^2 Pt. Status: Room:   REFERRING    Pasty Spillers, Scott T  Doreene Adas, Scott T  REFERRING    Tereso Newcomer T  SONOGRAPHER  Clearence Ped, RCS  PERFORMING   [facility], Outpatient  cc:  ------------------------------------------------------------------- LV EF: 55% -   60%  ------------------------------------------------------------------- Indications:      Atrial Fibrillation (I48.0).  ------------------------------------------------------------------- History:   PMH:  sleep apnea.  Congestive heart failure.  Aortic valve disease.  Risk factors:  Hypertension.  ------------------------------------------------------------------- Study Conclusions  - Left ventricle: The cavity size was normal. Wall thickness was   normal. Systolic function was normal. The estimated ejection   fraction was in the range of 55% to 60%. Doppler parameters are   consistent with abnormal left ventricular relaxation (grade 1   diastolic dysfunction). - Aortic valve: Trileaflet; mildly calcified leaflets. Mildly   elevated mean gradient across aortic valve but the valve appears   to open well. Suspect this is due to high flow with severe aortic   insufficiency. There was severe regurgitation. Mean gradient (S):   15 mm Hg. - Mitral valve: There was no significant regurgitation. - Left atrium: The atrium was moderately dilated. - Right ventricle: The cavity size was normal. Systolic function   was  normal. - Pulmonary arteries: No complete TR doppler jet so unable to   estimate PA systolic pressure. - Inferior vena cava: The vessel was normal in size. The   respirophasic diameter changes were in the normal range (= 50%),   consistent with normal central venous pressure.  Impressions:  - Normal LV size and systolic function, EF 55-60%. Normal RV size   and systolic function. Moderately dilated LA. The aortic valve   was trileaflet with severe aortic insufficiency. Mean gradient   across the valve was mildly elevated, but suspect this was due to   high flow in the setting of aortic insufficiency rather than   valvular stenosis.  ------------------------------------------------------------------- Study data:  Comparison was made to the study of 10/08/2015.  Study status:  Routine.  Procedure:  The patient reported no pain pre or post test. Transthoracic echocardiography. Image quality was adequate. Intravenous contrast (Definity) was administered.  Transthoracic echocardiography.  M-mode, complete 2D, spectral Doppler, and color Doppler.  Birthdate:  Patient birthdate: [birth year].  Age:  Patient is 62 yr old.  Sex:  Gender: female. BMI: 29.8 kg/m^2.  Blood pressure:     133/55  Patient status: Outpatient.  Study date:  Study date: 05/05/2017. Study time: 10:29 AM.  Location:  [facility]  -------------------------------------------------------------------  ------------------------------------------------------------------- Left ventricle:  The cavity size was normal. Wall thickness was normal. Systolic function was normal. The estimated ejection fraction was in the range of 55% to 60%.  Doppler parameters are consistent with abnormal left ventricular relaxation (grade 1 diastolic dysfunction).  ------------------------------------------------------------------- Aortic valve:   Trileaflet; mildly calcified leaflets. Mildly elevated mean gradient across aortic valve  but the valve appears to open well. Suspect this is due to high flow with severe aortic insufficiency.  Doppler:  There was severe regurgitation.    VTI ratio of LVOT to aortic valve: 0.46. Indexed valve area (VTI): 0.66 cm^2/m^2. Peak velocity ratio of LVOT to aortic valve: 0.45. Indexed valve area (Vmax): 0.66 cm^2/m^2. Mean velocity ratio of LVOT to aortic valve: 0.49. Indexed valve area (Vmean): 0.72 cm^2/m^2.    Mean gradient (S): 15 mm Hg. Peak gradient (S): 28 mm Hg.  ------------------------------------------------------------------- Aorta:  Aortic root: The aortic root was normal in size. Ascending aorta: The ascending aorta was normal in size.  ------------------------------------------------------------------- Mitral valve:   Mildly calcified leaflets .  Doppler:   There was no evidence for stenosis.   There was no significant regurgitation.    Peak gradient (D): 2 mm Hg.  ------------------------------------------------------------------- Left atrium:  The atrium was moderately dilated.  ------------------------------------------------------------------- Right ventricle:  The cavity size was normal. Systolic function was normal.  ------------------------------------------------------------------- Tricuspid valve:   Doppler:  There was trivial regurgitation.  ------------------------------------------------------------------- Pulmonary artery:   No complete TR doppler jet so unable to estimate PA systolic pressure.  ------------------------------------------------------------------- Right atrium:  The atrium was normal in size.  ------------------------------------------------------------------- Pericardium:  There was no pericardial effusion.  ------------------------------------------------------------------- Systemic veins: Inferior vena cava: The vessel was normal in size. The respirophasic diameter changes were in the normal range (=  50%), consistent with normal central venous pressure. Diameter: 13 mm.  ------------------------------------------------------------------- Measurements   IVC                                      Value          Reference  ID                                       13    mm       ----------    Left ventricle                           Value          Reference  LV ID, ED, PLAX chordal                  45.7  mm       43 - 52  LV ID, ES, PLAX chordal                  31.9  mm       23 - 38  LV fx shortening, PLAX chordal           30    %        >=29  Stroke volume, 2D                        62    ml       ----------  Stroke volume/bsa, 2D                    40    ml/m^2   ----------  LV e&', lateral                           6.42  cm/s     ----------  LV E/e&', lateral                         11.56          ----------  LV e&', medial                            6.53  cm/s     ----------  LV E/e&', medial                          11.36          ----------  LV e&', average                           6.48  cm/s     ----------  LV E/e&', average                         11.46          ----------    Ventricular septum                       Value          Reference  IVS thickness, ED                        10.6  mm       ----------    LVOT                                     Value          Reference  LVOT ID, S                               17    mm       ----------  LVOT area                                2.27  cm^2     ----------  LVOT peak velocity, S                    120   cm/s     ----------  LVOT mean velocity, S                    87    cm/s     ----------  LVOT VTI, S                              27.5  cm       ----------  LVOT peak gradient, S                    6     mm Hg    ----------    Aortic valve  Value          Reference  Aortic valve peak velocity, S            264   cm/s     ----------  Aortic valve mean velocity, S            176   cm/s      ----------  Aortic valve VTI, S                      60    cm       ----------  Aortic mean gradient, S                  15    mm Hg    ----------  Aortic peak gradient, S                  28    mm Hg    ----------  VTI ratio, LVOT/AV                       0.46           ----------  Aortic valve area/bsa, VTI               0.66  cm^2/m^2 ----------  Velocity ratio, peak, LVOT/AV            0.45           ----------  Aortic valve area/bsa, peak              0.66  cm^2/m^2 ----------  velocity  Velocity ratio, mean, LVOT/AV            0.49           ----------  Aortic valve area/bsa, mean              0.72  cm^2/m^2 ----------  velocity  Aortic regurg pressure half-time         417   ms       ----------    Aorta                                    Value          Reference  Aortic root ID, ED                       29    mm       ----------    Left atrium                              Value          Reference  LA ID, A-P, ES                           35    mm       ----------  LA ID/bsa, A-P                   (H)     2.23  cm/m^2   <=2.2  LA volume, ES, 1-p A4C                   68.9  ml       ----------  LA volume/bsa, ES, 1-p A4C               43.9  ml/m^2   ----------    Mitral valve                             Value          Reference  Mitral E-wave peak velocity              74.2  cm/s     ----------  Mitral A-wave peak velocity              89.2  cm/s     ----------  Mitral deceleration time                 211   ms       150 - 230  Mitral peak gradient, D                  2     mm Hg    ----------  Mitral E/A ratio, peak                   0.8            ----------    Right atrium                             Value          Reference  RA ID, S-I, ES, A4C                      46.3  mm       34 - 49  RA area, ES, A4C                         8.56  cm^2     8.3 - 19.5  RA volume, ES, A/L                       12.9  ml       ----------  RA volume/bsa, ES, A/L                   8.2    ml/m^2   ----------    Systemic veins                           Value          Reference  Estimated CVP                            3     mm Hg    ----------    Right ventricle                          Value          Reference  TAPSE                                    22.9  mm       ----------  RV s&', lateral, S  13.6  cm/s     ----------  Legend: (L)  and  (H)  mark values outside specified reference range.  ------------------------------------------------------------------- Prepared and Electronically Authenticated by  Marca Ancona, M.D. 2019-05-01T16:28:58                              *[place]*                   *[facility]*                         [address]                        [place], [place] [address]                            [phone]  ------------------------------------------------------------------- Transesophageal Echocardiography with Cardioversion  Patient:    Armilda, Vanderlinden MR #:       [id number] Study Date: 06/14/2017 Gender:     F Age:        1 Height: Weight: BSA: Pt. Status: Room:   ATTENDING    Kristeen Miss, M.D.  PERFORMING   Kristeen Miss, M.D.  REFERRING    Kristeen Miss, M.D.  SONOGRAPHER  Lavenia Atlas, RCS  ADMITTING    Nahser, Ali Lowe     Nahser, Jr  REFERRING    Nahser, Jr  cc:  ------------------------------------------------------------------- LV EF: 50% -   55%  ------------------------------------------------------------------- Indications:      Aortic insufficiency 424.1.  ------------------------------------------------------------------- History:   PMH:  SVT, resp. failure.  Arrhythmia.  Aortic valve disease.  Risk factors:  Hypertension.  ------------------------------------------------------------------- Study Conclusions  - Left ventricle: The estimated ejection fraction was in the range   of 50% to 55%. No evidence of thrombus. - Aortic  valve: There was severe regurgitation. - Mitral valve: There was mild regurgitation. - Left atrium: No evidence of thrombus in the atrial cavity or   appendage. - Right atrium: No evidence of thrombus in the atrial cavity or   appendage.  Impressions:  - Low normal LV function   Severe AI . The procedure was aborted early due to airway   concerns and SVT which required cardioversion   Small - moderate sized perimembranous VSD . Successful   cardioversion. No cardiac source of emboli was indentified.  ------------------------------------------------------------------- Study data:   Study status:  Routine.  Consent:  The risks, benefits, and alternatives to the procedure were explained to the patient and informed consent was obtained.  Procedure:  The patient reported no pain pre or post test. Initial setup. The patient was brought to the laboratory in the fasting state. A baseline ECG was recorded. Intravenous access was obtained. Surface ECG leads and pulse oximetric signals were monitored. Self-adhesive anterior-posterior defibrillation pads were applied. Sedation. Moderate sedation with intermittent deep sedation was administered during cardioversion by cardiology staff. Transesophageal echocardiography. Topical anesthesia was obtained using viscous lidocaine. A transesophageal probe was inserted by the attending cardiologistwithout difficulty. Image quality was excellent. Images were captured in a quad screen format to simplify data comparison. No intracardiac thrombus was identified.  Cardioversion. The rhythm was successfully converted to normal sinus rhythm.  Study completion:  All IVs inserted during the procedure were removed. The patient tolerated the procedure well. There were no complications.  Administered  medications:   Propofol. Transesophageal echocardiography with cardioversion.  2D and intravenous contrast injection.  Birthdate:  Patient birthdate: 10-Jul-1955.   Age:  Patient is 62 yr old.  Sex:  Gender: female. Blood pressure:     121/48  Patient status:  Outpatient.  Study date:  Study date: 06/14/2017. Study time: 10:49 AM.  Location: Endoscopy.  -------------------------------------------------------------------  ------------------------------------------------------------------- Left ventricle:  There is a small - medium sized VSD ( perimembranous VSD) with left to right shunting of blood. The estimated ejection fraction was in the range of 50% to 55%.  No evidence of thrombus.  ------------------------------------------------------------------- Aortic valve:   Doppler:  There was severe regurgitation.  ------------------------------------------------------------------- Mitral valve:   The valve appears to be grossly normal.    Doppler:  There was mild regurgitation.  ------------------------------------------------------------------- Left atrium:   No evidence of thrombus in the atrial cavity or appendage.  ------------------------------------------------------------------- Right ventricle:  The cavity size was normal. Wall thickness was normal. Systolic function was normal.  ------------------------------------------------------------------- Pulmonic valve:   Poorly visualized.  ------------------------------------------------------------------- Tricuspid valve:   The valve appears to be grossly normal. Doppler:  There was no significant regurgitation.  ------------------------------------------------------------------- Right atrium:  The atrium was normal in size.  No evidence of thrombus in the atrial cavity or appendage.   ------------------------------------------------------------------- Prepared and Electronically Authenticated by  Kristeen Miss, M.D. 2019-06-10T16:01:06   RIGHT/LEFT HEART CATH AND CORONARY ANGIOGRAPHY  Conclusion    Severe aortic regurgitation  Low normal LV systolic function.  LV  enlargement, mild, with globular appearing LV cavity.  EF 50%.  LVEDP 18 mmHg.  Nonobstructive coronary disease with 50 to 60% proximal to mid circumflex.  Segmental 40% proximal and mid LAD disease.  30% proximal RCA.  Left main is normal.  Normal pulmonary artery pressures.  No left to right shunt identified by oximetry run.  RECOMMENDATIONS:   Will refer to Dr. Evelene Croon for consideration of surgical aortic valve management.    No indication for antiplatelet therapy at this time.  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 Intravenous sedation with Versed and fentanyl was administered. 1% Xylocaine was infiltrated to achieve local analgesia. Using real-time vascular ultrasound, a double wall stick with an angiocath was utilized to obtain intra-arterial access. A VUS image was saved for the permanent record.The modified Seldinger technique was used to place a 77F " Slender" sheath in the right radial artery. Weight based heparin was administered. Coronary angiography was done using 5 F catheters. Right coronary angiography was performed with a JR4. Left ventricular hemodymic recordings were obtained using JR 4 catheter.. Left coronary angiography was performed with a JL 3.5 cm. Paravalvular aortography was performed using an angled pigtail catheter, power injector, 25 degree RAO projection, and contrast injection at 15 cc/s for total of 30 cc. No complications occurred.  Right heart catheterization was performed by exchanging a previously placed antecubital IV angio-cath in the left antecubital vein for a 5 French Slender sheath. 1% Xylocaine was  used to locally nesthetize the area around the IV site. The IV catheter was wired using an .018 guidewire. The modified Seldinger technique was used to place the 5 [place] sheath. Double glove technique was used to enhance sterility. A steerable 0.025 Glidewire  was used to advance into the right atrium. Right heart cath was performed using a 5 French balloon tipped catheter and fluoroscopic guidance. Pressures were recorded in each chamber but the pulmonary capillary wedge position. Because of tortuosity in the venous system the catheter could not be further advanced. A full oximetry run was obtained given the concern for possible small perimembranous VSD. No left or right stepup was noted in the PA oximetry.  Hemostasis was achieved using a pneumatic band.   During the procedure, the patient received Versed 0.5 mg and Fentanyl 25 mcg to achieve and maintain moderate conscious sedation. The patient's heart rate, blood pressure, and oxygen saturation are monitored continuously during the procedure. The period of conscious sedation is 49 minutes, of which I was present face-to-face 100% of this time.   Estimated blood loss <50 mL.  During this procedure the patient was administered the following to achieve and maintain moderate conscious sedation: Versed 0.5 mg, Fentanyl 25 mcg, while the patient's heart rate, blood pressure, and oxygen saturation were continuously monitored. The period of conscious sedation was 49 minutes, of which I was present face-to-face 100% of this time.  Coronary Findings   Diagnostic  Dominance: Co-dominant  Left Main  Dist LM to Ost LAD lesion 40% stenosed  Dist LM to Ost LAD lesion is 40% stenosed.  Left Anterior Descending  Prox LAD lesion 35% stenosed  Prox LAD lesion is 35% stenosed.  Left Circumflex  Ost Cx to Prox Cx lesion 50% stenosed  Ost Cx to Prox Cx lesion is 50% stenosed.  Right Coronary Artery  Prox RCA lesion 40% stenosed  Prox RCA lesion is 40%  stenosed.  Right Posterior Atrioventricular Branch  Vessel is small in size.  Intervention   No interventions have been documented.  Right Heart   Right Heart Pressures Hemodynamic findings consistent with pulmonary hypertension. Elevated LV EDP consistent with volume overload.  Wall Motion   Resting       All segments of the heart are normal.          Left Heart   Left Ventricle The left ventricle is dilated. The left ventricular systolic function is normal. LV end diastolic pressure is mildly elevated. The left ventricular ejection fraction is 50-55% by visual estimate. No regional wall motion abnormalities.  Aortic Valve There is no aortic valve stenosis. There is severe (4+) aortic regurgitation.  Aorta Aortic Root: The aortic root is enlarged. There is severe (4+) aortic regurgitation.  Coronary Diagrams   Diagnostic Diagram       Implants    No implant documentation for this case.  MERGE Images   Show images for CARDIAC CATHETERIZATION   Link to Procedure Log   Procedure Log    Hemo Data    Most Recent Value  Fick Cardiac Output 4.56 L/min  Fick Cardiac Output Index 3.15 (L/min)/BSA  Aortic Mean Gradient 9.35 mmHg  Aortic Peak Gradient 0 mmHg  Aortic Valve Area 1.81  Aortic Value Area Index 1.25 cm2/BSA  RA A Wave 7 mmHg  RA V Wave 4 mmHg  RA Mean 3 mmHg  RV Systolic Pressure 28 mmHg  RV Diastolic Pressure 2 mmHg  RV EDP 6 mmHg  PA Systolic Pressure 29 mmHg  PA Diastolic Pressure 9 mmHg  PA Mean 19 mmHg  AO Systolic Pressure 116 mmHg  AO Diastolic Pressure 52 mmHg  AO Mean 78 mmHg  LV Systolic Pressure 128 mmHg  LV Diastolic Pressure 9 mmHg  LV EDP 18  mmHg  AOp Systolic Pressure 128 mmHg  AOp Diastolic Pressure 57 mmHg  AOp Mean Pressure 86 mmHg  LVp Systolic Pressure 126 mmHg  LVp Diastolic Pressure 5 mmHg  LVp EDP Pressure 14 mmHg  QP/QS 1.22  TPVR Index 6.03 HRUI  TSVR Index 24.74 HRUI  TPVR/TSVR Ratio 0.24    ADDENDUM REPORT:  07/09/2017 17:00  EXAM: OVER-READ INTERPRETATION  CT CHEST  The following report is an over-read performed by radiologist Dr. Royal Piedra [facility] on 07/09/2017. This over-read does not include interpretation of cardiac or coronary anatomy or pathology. The coronary calcium score and cardiac CTA interpretation by the cardiologist is attached.  COMPARISON:  None.  FINDINGS: Aortic atherosclerosis. 4 mm subpleural nodule in the periphery of the left upper lobe (axial image 31 of series 12). Within the visualized portions of the thorax there are no other larger more suspicious appearing pulmonary nodules or masses, there is no acute consolidative airspace disease, no pleural effusions, no pneumothorax and no lymphadenopathy. Expansion of extrapleural fat in the lower aspect of the left hemithorax, similar to prior study from 08/29/2014. Visualized portions of the upper abdomen are unremarkable. There are no aggressive appearing lytic or blastic lesions noted in the visualized portions of the skeleton.  IMPRESSION: 1.  Aortic Atherosclerosis (ICD10-I70.0). 2. 4 mm subpleural nodule in the periphery of the left upper lobe, nonspecific but statistically likely benign. No follow-up needed if patient is low-risk. Non-contrast chest CT can be considered in 12 months if patient is high-risk. This recommendation follows the consensus statement: Guidelines for Management of Incidental Pulmonary Nodules Detected on CT Images: From the Fleischner Society 2017; Radiology 2017; 284:228-243.   Electronically Signed   By: Trudie Reed M.D.   On: 07/09/2017 17:00   Addended by Florencia Reasons, MD on 07/09/2017 5:02 PM    Study Result   CLINICAL DATA:  Aortic Regurgitation Turners Syndrome  EXAM: Cardiac CTA  MEDICATIONS: Sub lingual nitro. 4mg  and lopressor 10mg   TECHNIQUE: The patient was scanned on a Siemens Force 192 slice scanner.  Gantry rotation speed was 250 msecs. Collimation was .6 mm. A 100 kV prospective scan was triggered in the ascending thoracic aorta at 140 HU's Full mA was used between 35% and 75% of the R-R interval. Average HR during the scan was 60 bpm. The 3D data set was interpreted on a dedicated work station using MPR, MIP and VRT modes. A total of 80cc of contrast was used.  FINDINGS: Non-cardiac: See separate report from [facility]. No significant findings on limited lung and soft tissue windows.  Calcium Score: Significant calcium noted in LM and all 3 major epicardial vessels  Coronary Arteries: Right dominant with no anomalies  LM: Less than 30% calcified stenosis  LAD: Less than 30% calcified disease in proximal and mid vessel Significant misregistration artifact in all phases makes mid LAD impossible to grade but has significant plaque and calcium  D1: Normal  D2: Poorly seen  Circumflex: Less than 50% calcified plaque in mid vessel including take off of OM1  OM1: See above  RCA: Less than 30% calcific plaque in proximal mid and distal vessel  PDA: Normal  PLA: Normal  Aorta: Ascending aorta measures 3.5 cm The arch vessels have normal origin. The aortic arch itself is elongated and horizontal and tapers after the left carotid take off from 19 mm to 15 mm with pseudo coarctation The descending thoracic aorta measures 22 mm  No peri- membranous VSD is noted  No LAA thrombus noted  IMPRESSION: 1. Coronary calcium score 826 which is 99 th percentile for age and sex  2. Poor quality coronary study with misregistration artifact in all phases and poor filling likely from severe AR. Cannot r/o significant mid LAD stenosis Study not appropriate for FFR-CT due to artifact  3. Tri-leaflet AV with thickened leaflet tips and Central orifice for AR  4. Ascending Aortic root 3.5 cm with abnormal appearing arch which is elongated and horizontal  with "pseudo coarctation" tapering from 19 mm to 15 mm in it's distal portion before descending  5.  No LAA thrombus  6.  No peri membranous VSD noted  Charlton Haws  Electronically Signed: By: Charlton Haws M.D. 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  I have personally reviewed her 2D echo, TEE, cardiac cath, and gated cardiac CTA.  Her AI pressure half-time is 417 ms which is still within the moderate range.  Her left ventricular ejection fraction is normal and her left ventricular internal dimensions are within normal range.  She denies any symptoms although she is not very active.  There is a question of a perimembranous VSD on her TEE but this was not visualized on her gated cardiac CTA and there was no sign of a left to right shunt by oximetry at catheterization.  I suspect this is probably flow across her aortic valve that is seen on TEE and not a VSD.  Her cardiac catheterization shows moderate nonobstructive disease in the left circumflex territory and normal filling pressures.  I do not think there is any clear indication for surgical treatment at this time in this asymptomatic patient with normal left ventricular ejection fraction and normal left ventricular internal dimensions.  She should have continue close follow-up with an echocardiogram in 6 months and if she develops any symptoms of exertional fatigue or shortness of breath or has deterioration of her left ventricular systolic function or progressive increase in internal dimensions then surgical treatment would be clearly indicated.  I reviewed the study images with her and her mother and answered all their questions.  They are in agreement with continue close follow-up.  Plan:   She will continue to follow-up with Dr. Elease Hashimoto and should have a repeat echocardiogram in about 6 months.  I reviewed the symptoms of aortic  insufficiency with her and her mother and they will contact Dr. Elease Hashimoto or myself if she develops any of those.  I spent 45 minutes performing this consultation and > 50% of this time was spent face to face counseling and coordinating the care of this patient's asymptomatic moderate to severe aortic insufficiency.  Alleen Borne, MD [facility] [phone]

## 2017-08-10 ENCOUNTER — Telehealth: Payer: Self-pay | Admitting: Cardiovascular Disease

## 2017-08-10 NOTE — Telephone Encounter (Signed)
Patient has been taking atorvastatin and believes that its is causing her to be nauseous. It was the most recent medication change. The symptoms started 3 days ago and have not worsened. She has no other symptoms. Forwarding to Dr. Elease HashimotoNahser for recommendations.

## 2017-08-10 NOTE — Telephone Encounter (Signed)
New message    Pt c/o medication issue:  1. Name of Medication: atorvastatin (LIPITOR) 20 MG tablet  2. How are you currently taking this medication (dosage and times per day)? Take 1 tablet (20 mg total) by mouth daily.  3. Are you having a reaction (difficulty breathing--STAT)? Gagging  4. What is your medication issue? Mother wants to know if medication could be causing patient to gag. Patient has not been nauseous

## 2017-08-11 NOTE — Telephone Encounter (Signed)
OK to DC atorvastin if it is causing nausea

## 2017-08-11 NOTE — Telephone Encounter (Signed)
I spoke with pt's mother. She reports she also spoke with Dr. Ewing SchleinMagod about gagging and pepcid was changed to a different medication for 2 weeks. I told her to follow instructions from Dr. Ewing SchleinMagod and if symptoms did not improve after 2 weeks pt could stop atorvastatin to see if this helps with gagging symptoms.  Pt is seeing Ronie Spiesayna Dunn, PA on August 14.

## 2017-08-16 ENCOUNTER — Encounter (HOSPITAL_COMMUNITY): Payer: Self-pay | Admitting: Emergency Medicine

## 2017-08-16 ENCOUNTER — Other Ambulatory Visit: Payer: Self-pay

## 2017-08-16 ENCOUNTER — Inpatient Hospital Stay (HOSPITAL_COMMUNITY)
Admission: EM | Admit: 2017-08-16 | Discharge: 2017-08-20 | DRG: 309 | Disposition: A | Discharge status: Home / Self Care | Payer: Medicaid Other | Attending: Student in an Organized Health Care Education/Training Program | Admitting: Student in an Organized Health Care Education/Training Program

## 2017-08-16 ENCOUNTER — Emergency Department (HOSPITAL_COMMUNITY): Payer: Medicaid Other

## 2017-08-16 DIAGNOSIS — I5032 Chronic diastolic (congestive) heart failure: Secondary | ICD-10-CM | POA: Diagnosis present

## 2017-08-16 DIAGNOSIS — K219 Gastro-esophageal reflux disease without esophagitis: Secondary | ICD-10-CM | POA: Diagnosis present

## 2017-08-16 DIAGNOSIS — Z79890 Hormone replacement therapy (postmenopausal): Secondary | ICD-10-CM

## 2017-08-16 DIAGNOSIS — K224 Dyskinesia of esophagus: Secondary | ICD-10-CM | POA: Diagnosis present

## 2017-08-16 DIAGNOSIS — I480 Paroxysmal atrial fibrillation: Secondary | ICD-10-CM | POA: Diagnosis present

## 2017-08-16 DIAGNOSIS — I959 Hypotension, unspecified: Secondary | ICD-10-CM | POA: Diagnosis not present

## 2017-08-16 DIAGNOSIS — E039 Hypothyroidism, unspecified: Secondary | ICD-10-CM | POA: Diagnosis present

## 2017-08-16 DIAGNOSIS — Z79891 Long term (current) use of opiate analgesic: Secondary | ICD-10-CM

## 2017-08-16 DIAGNOSIS — R198 Other specified symptoms and signs involving the digestive system and abdomen: Secondary | ICD-10-CM | POA: Diagnosis not present

## 2017-08-16 DIAGNOSIS — K449 Diaphragmatic hernia without obstruction or gangrene: Secondary | ICD-10-CM | POA: Diagnosis not present

## 2017-08-16 DIAGNOSIS — D72829 Elevated white blood cell count, unspecified: Secondary | ICD-10-CM | POA: Diagnosis present

## 2017-08-16 DIAGNOSIS — G4733 Obstructive sleep apnea (adult) (pediatric): Secondary | ICD-10-CM | POA: Diagnosis present

## 2017-08-16 DIAGNOSIS — I4891 Unspecified atrial fibrillation: Secondary | ICD-10-CM | POA: Diagnosis present

## 2017-08-16 DIAGNOSIS — E0590 Thyrotoxicosis, unspecified without thyrotoxic crisis or storm: Secondary | ICD-10-CM | POA: Diagnosis present

## 2017-08-16 DIAGNOSIS — E871 Hypo-osmolality and hyponatremia: Secondary | ICD-10-CM | POA: Diagnosis present

## 2017-08-16 DIAGNOSIS — Z79899 Other long term (current) drug therapy: Secondary | ICD-10-CM

## 2017-08-16 DIAGNOSIS — E876 Hypokalemia: Secondary | ICD-10-CM | POA: Diagnosis present

## 2017-08-16 DIAGNOSIS — Q969 Turner's syndrome, unspecified: Secondary | ICD-10-CM | POA: Diagnosis not present

## 2017-08-16 DIAGNOSIS — I509 Heart failure, unspecified: Secondary | ICD-10-CM | POA: Diagnosis not present

## 2017-08-16 DIAGNOSIS — Z888 Allergy status to other drugs, medicaments and biological substances status: Secondary | ICD-10-CM

## 2017-08-16 DIAGNOSIS — E861 Hypovolemia: Secondary | ICD-10-CM | POA: Diagnosis present

## 2017-08-16 DIAGNOSIS — T500X5A Adverse effect of mineralocorticoids and their antagonists, initial encounter: Secondary | ICD-10-CM | POA: Diagnosis not present

## 2017-08-16 DIAGNOSIS — Z7901 Long term (current) use of anticoagulants: Secondary | ICD-10-CM

## 2017-08-16 DIAGNOSIS — Z808 Family history of malignant neoplasm of other organs or systems: Secondary | ICD-10-CM

## 2017-08-16 DIAGNOSIS — R1032 Left lower quadrant pain: Secondary | ICD-10-CM | POA: Diagnosis not present

## 2017-08-16 DIAGNOSIS — K228 Other specified diseases of esophagus: Secondary | ICD-10-CM | POA: Diagnosis present

## 2017-08-16 DIAGNOSIS — Y92230 Patient room in hospital as the place of occurrence of the external cause: Secondary | ICD-10-CM | POA: Diagnosis not present

## 2017-08-16 DIAGNOSIS — D72810 Lymphocytopenia: Secondary | ICD-10-CM | POA: Diagnosis present

## 2017-08-16 DIAGNOSIS — Z833 Family history of diabetes mellitus: Secondary | ICD-10-CM

## 2017-08-16 DIAGNOSIS — Z91040 Latex allergy status: Secondary | ICD-10-CM | POA: Diagnosis not present

## 2017-08-16 DIAGNOSIS — I2510 Atherosclerotic heart disease of native coronary artery without angina pectoris: Secondary | ICD-10-CM | POA: Diagnosis present

## 2017-08-16 DIAGNOSIS — I110 Hypertensive heart disease with heart failure: Secondary | ICD-10-CM | POA: Diagnosis present

## 2017-08-16 DIAGNOSIS — Q210 Ventricular septal defect: Secondary | ICD-10-CM | POA: Diagnosis not present

## 2017-08-16 DIAGNOSIS — Z96651 Presence of right artificial knee joint: Secondary | ICD-10-CM | POA: Diagnosis present

## 2017-08-16 DIAGNOSIS — I080 Rheumatic disorders of both mitral and aortic valves: Secondary | ICD-10-CM | POA: Diagnosis not present

## 2017-08-16 DIAGNOSIS — Z8249 Family history of ischemic heart disease and other diseases of the circulatory system: Secondary | ICD-10-CM

## 2017-08-16 DIAGNOSIS — I351 Nonrheumatic aortic (valve) insufficiency: Secondary | ICD-10-CM | POA: Diagnosis present

## 2017-08-16 DIAGNOSIS — J9612 Chronic respiratory failure with hypercapnia: Secondary | ICD-10-CM | POA: Diagnosis present

## 2017-08-16 DIAGNOSIS — Z23 Encounter for immunization: Secondary | ICD-10-CM

## 2017-08-16 DIAGNOSIS — R001 Bradycardia, unspecified: Secondary | ICD-10-CM | POA: Diagnosis not present

## 2017-08-16 DIAGNOSIS — Z803 Family history of malignant neoplasm of breast: Secondary | ICD-10-CM

## 2017-08-16 DIAGNOSIS — T461X5A Adverse effect of calcium-channel blockers, initial encounter: Secondary | ICD-10-CM | POA: Diagnosis not present

## 2017-08-16 DIAGNOSIS — Z96649 Presence of unspecified artificial hip joint: Secondary | ICD-10-CM | POA: Diagnosis present

## 2017-08-16 LAB — CBC WITH DIFFERENTIAL/PLATELET
ABS IMMATURE GRANULOCYTES: 0.3 10*3/uL — AB (ref 0.0–0.1)
BASOS ABS: 0 10*3/uL (ref 0.0–0.1)
Basophils Relative: 0 %
Eosinophils Absolute: 0 10*3/uL (ref 0.0–0.7)
Eosinophils Relative: 0 %
HCT: 39.8 % (ref 36.0–46.0)
HEMOGLOBIN: 13.8 g/dL (ref 12.0–15.0)
IMMATURE GRANULOCYTES: 2 %
LYMPHS PCT: 10 %
Lymphs Abs: 1.4 10*3/uL (ref 0.7–4.0)
MCH: 33.7 pg (ref 26.0–34.0)
MCHC: 34.7 g/dL (ref 30.0–36.0)
MCV: 97.3 fL (ref 78.0–100.0)
MONO ABS: 1.8 10*3/uL — AB (ref 0.1–1.0)
Monocytes Relative: 12 %
NEUTROS ABS: 11.2 10*3/uL — AB (ref 1.7–7.7)
Neutrophils Relative %: 76 %
Platelets: 304 10*3/uL (ref 150–400)
RBC: 4.09 MIL/uL (ref 3.87–5.11)
RDW: 13.3 % (ref 11.5–15.5)
WBC: 14.6 10*3/uL — ABNORMAL HIGH (ref 4.0–10.5)

## 2017-08-16 LAB — COMPREHENSIVE METABOLIC PANEL
ALK PHOS: 62 U/L (ref 38–126)
ALT: 54 U/L — AB (ref 0–44)
AST: 30 U/L (ref 15–41)
Albumin: 3.3 g/dL — ABNORMAL LOW (ref 3.5–5.0)
Anion gap: 12 (ref 5–15)
BILIRUBIN TOTAL: 1.2 mg/dL (ref 0.3–1.2)
BUN: 21 mg/dL (ref 8–23)
CALCIUM: 8.6 mg/dL — AB (ref 8.9–10.3)
CO2: 26 mmol/L (ref 22–32)
CREATININE: 0.95 mg/dL (ref 0.44–1.00)
Chloride: 90 mmol/L — ABNORMAL LOW (ref 98–111)
Glucose, Bld: 89 mg/dL (ref 70–99)
Potassium: 3.4 mmol/L — ABNORMAL LOW (ref 3.5–5.1)
Sodium: 128 mmol/L — ABNORMAL LOW (ref 135–145)
TOTAL PROTEIN: 5.5 g/dL — AB (ref 6.5–8.1)

## 2017-08-16 LAB — BRAIN NATRIURETIC PEPTIDE: B Natriuretic Peptide: 1720.4 pg/mL — ABNORMAL HIGH (ref 0.0–100.0)

## 2017-08-16 LAB — URINALYSIS, ROUTINE W REFLEX MICROSCOPIC
BILIRUBIN URINE: NEGATIVE
GLUCOSE, UA: NEGATIVE mg/dL
HGB URINE DIPSTICK: NEGATIVE
KETONES UR: NEGATIVE mg/dL
LEUKOCYTES UA: NEGATIVE
Nitrite: NEGATIVE
PH: 7 (ref 5.0–8.0)
Protein, ur: NEGATIVE mg/dL
Specific Gravity, Urine: 1.004 — ABNORMAL LOW (ref 1.005–1.030)

## 2017-08-16 LAB — MAGNESIUM: Magnesium: 2 mg/dL (ref 1.7–2.4)

## 2017-08-16 LAB — I-STAT TROPONIN, ED: TROPONIN I, POC: 0.07 ng/mL (ref 0.00–0.08)

## 2017-08-16 MED ORDER — FUROSEMIDE 10 MG/ML IJ SOLN
40.0000 mg | Freq: Once | INTRAMUSCULAR | Status: AC
  Administered 2017-08-16: 40 mg via INTRAVENOUS
  Filled 2017-08-16: qty 4

## 2017-08-16 MED ORDER — DILTIAZEM LOAD VIA INFUSION
10.0000 mg | Freq: Once | INTRAVENOUS | Status: AC
  Administered 2017-08-16: 10 mg via INTRAVENOUS
  Filled 2017-08-16: qty 10

## 2017-08-16 MED ORDER — POTASSIUM CHLORIDE CRYS ER 20 MEQ PO TBCR: 30.0000 meq | EXTENDED_RELEASE_TABLET | Freq: Once | ORAL | Status: DC

## 2017-08-16 MED ORDER — DILTIAZEM HCL-DEXTROSE 100-5 MG/100ML-% IV SOLN (PREMIX)
5.0000 mg/h | INTRAVENOUS | Status: DC
  Administered 2017-08-16 – 2017-08-17 (×3): 5 mg/h via INTRAVENOUS
  Filled 2017-08-16 (×2): qty 100

## 2017-08-16 MED ORDER — APIXABAN 5 MG PO TABS
5.0000 mg | ORAL_TABLET | Freq: Two times a day (BID) | ORAL | Status: DC
  Administered 2017-08-17 – 2017-08-20 (×8): 5 mg via ORAL
  Filled 2017-08-16 (×8): qty 1

## 2017-08-16 MED ORDER — POTASSIUM CHLORIDE CRYS ER 10 MEQ PO TBCR
10.0000 meq | EXTENDED_RELEASE_TABLET | Freq: Every day | ORAL | Status: DC
  Administered 2017-08-17 – 2017-08-20 (×4): 10 meq via ORAL
  Filled 2017-08-16 (×4): qty 1

## 2017-08-16 MED ORDER — POTASSIUM CHLORIDE CRYS ER 20 MEQ PO TBCR
40.0000 meq | EXTENDED_RELEASE_TABLET | Freq: Once | ORAL | Status: AC
  Administered 2017-08-17: 40 meq via ORAL
  Filled 2017-08-16: qty 2

## 2017-08-16 MED ORDER — SODIUM CHLORIDE 0.9 % IV BOLUS
500.0000 mL | Freq: Once | INTRAVENOUS | Status: AC
  Administered 2017-08-16: 500 mL via INTRAVENOUS

## 2017-08-16 MED ORDER — LEVOTHYROXINE SODIUM 88 MCG PO TABS
88.0000 ug | ORAL_TABLET | Freq: Every day | ORAL | Status: DC
  Administered 2017-08-17: 88 ug via ORAL
  Filled 2017-08-16: qty 1

## 2017-08-16 MED ORDER — ATORVASTATIN CALCIUM 20 MG PO TABS
20.0000 mg | ORAL_TABLET | Freq: Every day | ORAL | Status: DC
  Administered 2017-08-17 – 2017-08-20 (×4): 20 mg via ORAL
  Filled 2017-08-16 (×4): qty 1

## 2017-08-16 NOTE — ED Notes (Signed)
ED Provider at bedside. 

## 2017-08-16 NOTE — H&P (Addendum)
Date: 08/17/2017               Patient Name:  [name] MRN: [id number]  DOB: [birth year] Age / Sex: 62 y.o., female   PCP: Tracey HarriesBouska, David, MD         Medical Service: Internal Medicine Teaching Service         Attending Physician: Dr. Levert FeinsteinGranfortuna, James M, MD    First Contact: Dr. Maryelizabeth KaufmannVogel, Marie Pager: [phone]  Second Contact: Dr. Eulah PontBlum, Nina Pager: [phone]       After Hours (After 5p/  First Contact Pager: [phone]  weekends / holidays): Second Contact Pager: [phone]   Chief Complaint: Shortness of breath atrial fibrilation   History of Present Illness: [age] year old female with history of paroxysmal A. Fib (on metoprolol and Eliquis at home),Turner syndrome, VSD, aortic insufficiency, HTN, hypothyroidism grade 1 diastolic dysfunction and , presented to ED for shortness of breath and atrial fibrilation . Patient reports some transient episode of shortness of breath at the morning of admission, she was seen by her PCP and she found to have Afib with RVR and was sent to ED for further workup. Her SOB, was not associated with chest pain, palpitation. During last week, she has had some nausea and gagging sound and acid reflux with no vomiting that worsened on the day of admission. When asked more, she recalled some mild pain in her LLQ than was bothered her more in past few days. She felt cold but no fever, diarrhea, constipation or urinary symptoms reported. She has Hx of Paroxysmal Afib, had  Hospital admission at 2016 due to similar presentation of gagging, Afib. She has been compliant with her medications, and denies any recent changes except her GERD medication. Did not report any bleeding or ecchymosis while on Eliquis.  Meds:  No current facility-administered medications on file prior to encounter.    Current Outpatient Medications on File Prior to Encounter  Medication Sig Dispense Refill  . acetaminophen (TYLENOL) 500 MG tablet Take 1,000 mg by mouth every 6 (six) hours as  needed for moderate pain or headache.    Marland Kitchen. atorvastatin (LIPITOR) 20 MG tablet Take 1 tablet (20 mg total) by mouth daily. 30 tablet 11  . Cholecalciferol (VITAMIN D3) 2000 UNITS capsule Take 2,000 Units by mouth daily.    Marland Kitchen. ELIQUIS 5 MG TABS tablet TAKE 1 TABLET BY MOUTH TWICE DAILY 60 tablet 10  . famotidine (PEPCID) 40 MG tablet Take 40 mg by mouth 2 (two) times daily.    . furosemide (LASIX) 20 MG tablet TAKE 2 TABLETS BY MOUTH DAILY AND 1 TABLET EACH EVENING (Patient taking differently: Take 20-40 mg by mouth See admin instructions. TAKE 40 MG BY MOUTH DAILY AND 20 MG BY MOUTH EACH EVENING) 90 tablet 11  . levothyroxine (SYNTHROID, LEVOTHROID) 88 MCG tablet TAKE ONE TABLET BY MOUTH ONCE DAILY    . metoprolol tartrate (LOPRESSOR) 25 MG tablet TAKE 1 TABLET BY MOUTH TWICE DAILY 180 tablet 3  . potassium chloride (K-DUR) 10 MEQ tablet Take 1 tablet (10 mEq total) by mouth daily. 30 tablet 2  . spironolactone (ALDACTONE) 25 MG tablet Take 1 tablet (25 mg total) by mouth 2 (two) times daily. 60 tablet 11  . traMADol (ULTRAM) 50 MG tablet Take 50 mg by mouth every 6 (six) hours as needed for moderate pain.      No outpatient medications have been marked as taking for the 08/16/17 encounter [facility](Hospital Encounter).  Allergies: Allergies as of 08/16/2017 - Review Complete 08/16/2017  Allergen Reaction Noted  . Cephalexin Rash and Other (See Comments) 09/08/2011   Past Medical History:  Diagnosis Date  . Adiposity 06/04/2011  . Adult hypothyroidism 06/04/2011  . Aortic insufficiency    a. severe by echo, TEE 2019.  Marland Kitchen Benign hypertensive heart disease without heart failure 10/16/2010  . Cervix abnormality 1993   MILD ATYPICAL ADENOMATOUS HYPERPLASIA OF CERVIX  . Chronic diastolic CHF (congestive heart failure) (HCC) 08/29/2014  . Chronic respiratory failure with hypercapnia (HCC) 07/06/2012   Followed in Pulmonary clinic/ [facility]  - 05/2012 > 2lpm started at [facility] with HCO3 33-35 on  bmets c/w hypercarbia. - PSS 08/29/12 Severe obstructive sleep apnea/hypopnea syndrome, with an AHI of 79 > see OSA - Placed on 24h 02 3lpm at d/c 09/04/14 - stopped 12/2014     . Dermatitis 05-26-12   all over  since 9'13  . Difficult intubation    will plan on spinal anesthesia"small mouth/airway"  . Dyspnea   . Edema of both legs    a. right greater than left, due to Turner's syndrome.  . Gagging episode   . H/O total hip arthroplasty 06/04/2011  . Hyperglycemia 12/23/2011  . Hypertension   . Hypothyroidism   . LAD (lymphadenopathy), mediastinal 08/30/2014  . Obesity 09/16/2014  . Orbital floor (blow-out) closed fracture (HCC) 08/17/2011  . OSA (obstructive sleep apnea) 09/10/2012   NPSG 08/29/12:  Severe obstructive sleep apnea/hypopnea syndrome, with an AHI of 79  06/2016 Bipap 21/17  . Osteoarthritis 10/16/2010   Overview:  Dr. Charlann Boxer (hips and knees)   . PAF (paroxysmal atrial fibrillation) (HCC)   . Periorbital edema   . 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 Severe aortic insufficiency   . Speech disorder    occ. "stutter"  . Supraventricular tachycardia (HCC) 08/27/2014  . Turner syndrome 06/04/2011  . Turner's syndrome   . Ventricular septal defect   . VSD (ventricular septal defect)    a. ? by TEE 06/2017, not seen on cardiac CT.    Family History: Afib in mother, Aortic aneurysm in  maternal grandmother, diabetes in uncles Social History: No smoking, no alcohol no drug  Review of Systems: A complete ROS was negative except as per HPI.   Physical Exam: Blood pressure 114/60, pulse 93, temperature (!) 97.4 F (36.3 C), temperature source Oral, resp. rate (!) 24, height 4\' 8"  (1.422 m), weight 58 kg, SpO2 98 %.  Ph/E: Alert and oriented lady. Turner syndrome appearence. In no acute distress. CV: Irregular. Distant cardiac sound Lungs: mild fine crackles at bases Abdomen: BS are present. Soft. Tender at peri-umblical and left lower quadrant Extremities: Non pitting edema on lower  extremities. Right>left. Pulses: Irregular. Normal bilaterally  CBC Latest Ref Rng & Units 08/16/2017 07/15/2017 05/05/2017  WBC 4.0 - 10.5 K/uL 14.6(H) 11.7(H) 10.8  Hemoglobin 12.0 - 15.0 g/dL 09.8 11.9 14.7  Hematocrit 36.0 - 46.0 % 39.8 40.4 40.4  Platelets 150 - 400 K/uL 304 362 345   CMP Latest Ref Rng & Units 08/16/2017 07/15/2017 06/28/2017  Glucose 70 - 99 mg/dL 89 829(F) 89  BUN 8 - 23 mg/dL 21 21 62(Z)  Creatinine 0.44 - 1.00 mg/dL 3.08 6.57 8.46  Sodium 135 - 145 mmol/L 128(L) 135 136  Potassium 3.5 - 5.1 mmol/L 3.4(L) 4.1 4.9  Chloride 98 - 111 mmol/L 90(L) 92(L) 96  CO2 22 - 32 mmol/L 26 25 25   Calcium 8.9 - 10.3 mg/dL 9.6(E)  10.0 9.9  Total Protein 6.5 - 8.1 g/dL 1.6(X5.5(L) - -  Total Bilirubin 0.3 - 1.2 mg/dL 1.2 - -  Alkaline Phos 38 - 126 U/L 62 - -  AST 15 - 41 U/L 30 - -  ALT 0 - 44 U/L 54(H) - -   Sodium 128 Potassium 3.4 WBC: 14.6 UA no infection, with a specific gravity of 1004 BNP 1720 TSH 0.26 (1 month ago 1.51) EKG: personally reviewed my interpretation is A-fib with RVR with HR of 180 CXR: personally reviewed my interpretation is cardiomegaly  Assessment & Plan by Problem: Active Problems:   Atrial fibrillation with RVR (HCC)  62 year old female with history of Turner syndrome, with VSD, aortic insufficiency, HTN, hypothyroidism and paroxysmal A. Fib (on metoprolol and Eliquis at home), presented to ED for shortness of breath and atrial fibrilation   Static atrial fibrillation with RVR: Patient has history of paroxysmal A. fib on metoprolol and Eliquis.  May be triggered in setting of elevated thyroid hormones, infection.  Rate controled with Cardizem drip at ED. Low TSH: 0.26  Leucocytosis 14.6  -Cantinue Diltiazem drip -Continue Eliquis 5 mg twice daily  #Hypothyroidism: TSH: 0.26. May required adjusting synthroid dose. It might have triggered Afib with RVR  -Free T4, T3  #Gagging, GERD: Possibly due to hiatal hernia, esophogeal structure  abnormality Barium swallow 10/13/14 Torturous esophagus with a moderate in size hiatal hernia.Recommended EGD, Correlation with upper endoscopy is recommended.apparently did not follow up -Follow up with GI   #LLQ pain, abdominal tenderness, nausea x few days+ leucocytosis. (U/A is clear, no pneumonia on CXR) -CT scan to evaluate for diverticulitis  #Hyponatremia: Na:128. likely 2/2 CHF (Grade 1 diastolic dysfunction). S/P 500 ml NaCl  -Repeat CMP this AM  #Hypokalemia: -PO Kdur  -Repat BMP   #CHF, HTN: Currently no, minor volume overload. -Lasix 40 mg IV -Held Metoprolol and spironolactone due to soft blood pressure  #AI, VSD: Lats echo and heart cath was 1-2 months ago. Following up outpatient Lats echo and heart cath:  Severe aortic regurgitation  Low normal LV systolic function.  LV enlargement, mild, with globular appearing LV cavity.  EF 50%.  LVEDP 18 mmHg.  Nonobstructive coronary disease with 50 to 60% proximal to mid circumflex.  Segmental 40% proximal and mid LAD disease.  30% proximal RCA.  Left main is normal. Normal pulmonary artery pressures.  No left to right shunt identified by oximetry run.RECOMMENDATIONS: Will refer to Dr. Evelene CroonBryan Bartle for consideration of surgical aortic valve management. (Per patient's mother, there is no plan for surgery now)   -Continue follow up outpatient  Diet: Heart healthy IV fluid: N/a VTE ppx: Enoxaparin Code status: Full code  Dispo: Admit patient to Inpatient with expected length of stay greater than 2 midnights.  SignedChevis Pretty: Mabelle Mungin, MD 08/17/2017, 5:29 AM  Pager: [phone]

## 2017-08-16 NOTE — ED Triage Notes (Signed)
Per EMS, pt coming from MD's office for follow up on patient having gagging symptoms for the last 8 days. Pt was put on on EKG and was found to be in Afib with RVR. Pt has hx of Afib but is not typically in it at cardiology follow ups. Pt has hx of CHF. HR 140's.

## 2017-08-16 NOTE — ED Provider Notes (Signed)
[facility] EMERGENCY DEPARTMENT Provider Note   CSN: [id number] Arrival date & time: 08/16/17  1810     History   Chief Complaint Chief Complaint  Patient presents with  . Atrial Fibrillation    HPI [name] is a 62 y.o. female.  Patient is a 62 year old female with a history of aortic insufficiency, VSD, Turner syndrome, respiratory failure with hypercapnia, hypertension who is presenting today with her mother and paramedics for shortness of breath and A. fib RVR.  Mother states for the last 4 days she has been making gagging noises but is not having any vomiting.  Patient is also complained of some shortness of breath.  She denies any chest pain or palpitations.  She has been taking all of her medications as prescribed and has not had any recent changes in her medications.  Patient denies any new swelling in her lower extremities, fever, cough, nausea vomiting or abdominal discomfort.  Patient went to her PCP today because of the symptoms and she was found to be in A. fib RVR with a rate of 180.  Mother states patient's blood pressure is always in the low 100s but last time she was seen by her cardiologist he was in sinus rhythm.  She does take Eliquis daily.  The history is provided by the patient and a parent.    Past Medical History:  Diagnosis Date  . Adiposity 06/04/2011  . Adult hypothyroidism 06/04/2011  . Aortic insufficiency    a. severe by echo, TEE 2019.  Marland Kitchen Benign hypertensive heart disease without heart failure 10/16/2010  . Cervix abnormality 1993   MILD ATYPICAL ADENOMATOUS HYPERPLASIA OF CERVIX  . Chronic diastolic CHF (congestive heart failure) (HCC) 08/29/2014  . Chronic respiratory failure with hypercapnia (HCC) 07/06/2012   Followed in Pulmonary clinic/ [facility]  - 05/2012 > 2lpm started at [facility] with HCO3 33-35 on bmets c/w hypercarbia. - PSS 08/29/12 Severe obstructive sleep apnea/hypopnea syndrome, with an AHI of 79 > see OSA -  Placed on 24h 02 3lpm at d/c 09/04/14 - stopped 12/2014     . Dermatitis 05-26-12   all over  since 9'13  . Difficult intubation    will plan on spinal anesthesia"small mouth/airway"  . Dyspnea   . Edema of both legs    a. right greater than left, due to Turner's syndrome.  . Gagging episode   . H/O total hip arthroplasty 06/04/2011  . Hyperglycemia 12/23/2011  . Hypertension   . Hypothyroidism   . LAD (lymphadenopathy), mediastinal 08/30/2014  . Obesity 09/16/2014  . Orbital floor (blow-out) closed fracture (HCC) 08/17/2011  . OSA (obstructive sleep apnea) 09/10/2012   NPSG 08/29/12:  Severe obstructive sleep apnea/hypopnea syndrome, with an AHI of 79  06/2016 Bipap 21/17  . Osteoarthritis 10/16/2010   Overview:  Dr. Charlann Boxer (hips and knees)   . PAF (paroxysmal atrial fibrillation) (HCC)   . Periorbital edema   . 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 Severe aortic insufficiency   . Speech disorder    occ. "stutter"  . Supraventricular tachycardia (HCC) 08/27/2014  . Turner syndrome 06/04/2011  . Turner's syndrome   . Ventricular septal defect   . VSD (ventricular septal defect)    a. ? by TEE 06/2017, not seen on cardiac CT.    Patient Active Problem List   Diagnosis Date Noted  . Severe aortic insufficiency   . Ventricular septal defect   . PAF (paroxysmal atrial fibrillation) (HCC) 10/12/2014  . Chronic diastolic  CHF (congestive heart failure) (HCC) 10/12/2014  . Gagging episode   . Obesity 09/16/2014  . Dyspnea   . Lung nodule 08/30/2014  . LAD (lymphadenopathy), mediastinal 08/30/2014  . Supraventricular tachycardia (HCC) 08/27/2014  . OSA (obstructive sleep apnea) 09/10/2012  . Chronic respiratory failure with hypercapnia (HCC) 07/06/2012  . Obese 06/01/2012  . 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 Hyperglycemia 12/23/2011  . Orbital floor (blow-out) closed fracture (HCC) 08/17/2011  . H/O total hip arthroplasty 06/04/2011  . Adult hypothyroidism 06/04/2011  . Adiposity 06/04/2011  . Turner  syndrome 06/04/2011  . Cervix abnormality   . 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 Aortic insufficiency 10/16/2010  . Osteoarthritis 10/16/2010    Past Surgical History:  Procedure Laterality Date  . CARDIOVERSION  06/14/2017   Procedure: CARDIOVERSION;  Surgeon: Elease HashimotoNahser, Deloris PingPhilip J, MD;  [facility];  Service: Cardiovascular;;  . CERVICAL CONE BIOPSY  1993  . COLPOSCOPY    . DILATION AND CURETTAGE OF UTERUS  1993   CONE BIOPSY  . HERNIA REPAIR    . JOINT REPLACEMENT     hip replacement  . RIGHT/LEFT HEART CATH AND CORONARY ANGIOGRAPHY N/A 07/20/2017   Procedure: RIGHT/LEFT HEART CATH AND CORONARY ANGIOGRAPHY;  Surgeon: Lyn RecordsSmith, Henry W, MD;  Location: [facility] INVASIVE CV LAB;  Service: Cardiovascular;  Laterality: N/A;  . TEE WITHOUT CARDIOVERSION N/A 06/14/2017   Procedure: TRANSESOPHAGEAL ECHOCARDIOGRAM (TEE);  Surgeon: Elease HashimotoNahser, Deloris PingPhilip J, MD;  [facility];  Service: Cardiovascular;  Laterality: N/A;  . TOTAL HIP ARTHROPLASTY     LEFT  . TOTAL KNEE ARTHROPLASTY Right 05/31/2012   Procedure: RIGHT TOTAL KNEE ARTHROPLASTY;  Surgeon: Shelda PalMatthew D Olin, MD;  Location: [facility] ORS;  Service: Orthopedics;  Laterality: Right;  . ULTRASOUND GUIDANCE FOR VASCULAR ACCESS  07/20/2017   Procedure: Ultrasound Guidance For Vascular Access;  Surgeon: Lyn RecordsSmith, Henry W, MD;  [facility];  Service: Cardiovascular;;  . US ECHOCARDIOGRAPHY  06/20/2009   EF 55-60%     OB History    Gravida  0   Para      Term      Preterm      AB      Living        SAB      TAB      Ectopic      Multiple      Live Births               Home Medications    Prior to Admission medications   Medication Sig Start Date End Date Taking? Authorizing Provider  acetaminophen (TYLENOL) 500 MG tablet Take 1,000 mg by mouth every 6 (six) hours as needed for moderate pain or headache.    [provider]  atorvastatin (LIPITOR) 20 MG tablet Take 1  tablet (20 mg total) by mouth daily. 03/17/17 03/12/18  Tereso NewcomerWeaver, Scott T, PA-C  Cholecalciferol (VITAMIN D3) 2000 UNITS capsule Take 2,000 Units by mouth daily.    [provider]  ELIQUIS 5 MG TABS tablet TAKE 1 TABLET BY MOUTH TWICE DAILY 07/23/17   Nahser, Deloris PingPhilip J, MD  famotidine (PEPCID) 40 MG tablet Take 40 mg by mouth 2 (two) times daily.    [provider]  furosemide (LASIX) 20 MG tablet TAKE 2 TABLETS BY MOUTH DAILY AND 1 TABLET EACH EVENING Patient taking differently: Take 20-40 mg by mouth See admin instructions. TAKE 40 MG BY MOUTH DAILY AND 20 MG BY MOUTH EACH EVENING  12/10/16   Nahser, Deloris PingPhilip J, MD  levothyroxine (SYNTHROID, LEVOTHROID) 88 MCG tablet TAKE ONE TABLET BY MOUTH ONCE DAILY 04/07/16   [provider]  metoprolol tartrate (LOPRESSOR) 25 MG tablet TAKE 1 TABLET BY MOUTH TWICE DAILY 06/03/17   Nahser, Deloris PingPhilip J, MD  potassium chloride (K-DUR) 10 MEQ tablet Take 1 tablet (10 mEq total) by mouth daily. 07/15/17   Dunn, Tacey Ruizayna N, PA-C  spironolactone (ALDACTONE) 25 MG tablet Take 1 tablet (25 mg total) by mouth 2 (two) times daily. 01/20/17   Nahser, Deloris PingPhilip J, MD  traMADol (ULTRAM) 50 MG tablet Take 50 mg by mouth every 6 (six) hours as needed for moderate pain.     [provider]    Family History Family History  Problem Relation Age of Onset  . Hypertension Mother   . Cancer Mother        SKIN CANCER  . Atrial fibrillation Mother   . Breast cancer Cousin        MATERNAL COUSIN  . Diabetes Maternal Uncle     Social History Social History   Tobacco Use  . Smoking status: Never Smoker  . Smokeless tobacco: Never Used  Substance Use Topics  . Alcohol use: No  . Drug use: No     Allergies   Cephalexin   Review of Systems Review of Systems  All other systems reviewed and are negative.    Physical Exam Updated Vital Signs BP 105/68   Pulse (!) 104   Temp 98.6 F (37 C) (Oral)   Resp (!) 27   Ht 4\' 8"  (1.422 m)   Wt 58 kg    SpO2 96%   BMI 28.67 kg/m   Physical Exam  Constitutional: She is oriented to person, place, and time. She appears well-developed and well-nourished. No distress.  Abnormal facial features consistent with Turner syndrome.  Webbed neck  HENT:  Head: Normocephalic and atraumatic.  Mouth/Throat: Oropharynx is clear and moist.  Eyes: Pupils are equal, round, and reactive to light. Conjunctivae and EOM are normal.  Neck: Normal range of motion. Neck supple.  Cardiovascular: Intact distal pulses. An irregularly irregular rhythm present. Tachycardia present.  No murmur heard. Pulmonary/Chest: Effort normal and breath sounds normal. No respiratory distress. She has no wheezes. She has no rales.  Abdominal: Soft. She exhibits no distension. There is no tenderness. There is no rebound and no guarding.  Musculoskeletal: Normal range of motion. She exhibits no edema or tenderness.  Neurological: She is alert and oriented to person, place, and time.  Skin: Skin is warm and dry. No rash noted. No erythema.  Psychiatric: She has a normal mood and affect. Her behavior is normal.  Nursing note and vitals reviewed.    ED Treatments / Results  Labs (all labs ordered are listed, but only abnormal results are displayed) Labs Reviewed  CBC WITH DIFFERENTIAL/PLATELET - Abnormal; Notable for the following components:      Result Value   WBC 14.6 (*)    Neutro Abs 11.2 (*)    Monocytes Absolute 1.8 (*)    Abs Immature Granulocytes 0.3 (*)    All other components within normal limits  COMPREHENSIVE METABOLIC PANEL - Abnormal; Notable for the following components:   Sodium 128 (*)    Potassium 3.4 (*)    Chloride 90 (*)    Calcium 8.6 (*)    Total Protein 5.5 (*)    Albumin 3.3 (*)    ALT 54 (*)    All  other components within normal limits  BRAIN NATRIURETIC PEPTIDE - Abnormal; Notable for the following components:   B Natriuretic Peptide 1,720.4 (*)    All other components within normal limits    I-STAT TROPONIN, ED    EKG EKG Interpretation  Date/Time:  Monday August 16 2017 18:12:52 EDT Ventricular Rate:  180 PR Interval:    QRS Duration: 84 QT Interval:  273 QTC Calculation: 473 R Axis:   33 Text Interpretation:  recurrent Atrial fibrillation with rapid V-rate Anterior infarct, age indeterminate Baseline wander in lead(s) V2 V3 V5 Confirmed by Gwyneth Sprout ([id number]) on 08/16/2017 7:06:59 PM   Radiology Dg Chest Port 1 View  Result Date: 08/16/2017 CLINICAL DATA:  Shortness of breath and gagging symptoms EXAM: PORTABLE CHEST 1 VIEW COMPARISON:  07/09/17 FINDINGS: Cardiac shadow is enlarged but stable. No focal infiltrate or sizable effusion is noted. No acute bony abnormality is seen. IMPRESSION: Stable cardiomegaly.  No acute abnormality noted. Electronically Signed   By: Alcide Clever M.D.   On: 08/16/2017 19:01    Procedures Procedures (including critical care time)  Medications Ordered in ED Medications  sodium chloride 0.9 % bolus 500 mL (500 mLs Intravenous New Bag/Given 08/16/17 1842)  diltiazem (CARDIZEM) 1 mg/mL load via infusion 10 mg (10 mg Intravenous Bolus from Bag 08/16/17 1857)    And  diltiazem (CARDIZEM) 100 mg in dextrose 5% (1 mg/mL) infusion (5 mg/hr Intravenous New Bag/Given 08/16/17 1856)     Initial Impression / Assessment and Plan / ED Course  I have reviewed the triage vital signs and the nursing notes.  Pertinent labs & imaging results that were available during my care of the patient were reviewed by me and considered in my medical decision making (see chart for details).     Patient presenting today with A. fib RVR with only complaint of being mildly short of breath for the last 4 days.  She does have a prior history of paroxysmal atrial fibrillation she does take metoprolol and Eliquis.  She has had no recent medication changes.  She denies any chest pain or abdominal pain.  She is well-appearing on exam.  Blood pressure is in the  low 100s and heart rate is between 1 40-1 60.  Patient given a small fluid bolus that she does not appear fluid overloaded and started on a Cardizem drip given symptoms have been greater than 24 hours. CBC, CMP, troponin, BNP, chest x-ray pending  8:48 PM : Within normal limits, CBC with mild leukocytosis of 14,000 of unknown significance, CMP with new hyponatremia most likely related to CHF.  Patient's BNP is 1700 and chest x-ray is consistent with stable cardiomegaly.  Patient rate improved with Cardizem drip but still in atrial fibrillation.  Patient given IV Lasix.  Will admit for further care.  CRITICAL CARE Performed by: Darlin Stenseth Total critical care time: 30 minutes Critical care time was exclusive of separately billable procedures and treating other patients. Critical care was necessary to treat or prevent imminent or life-threatening deterioration. Critical care was time spent personally by me on the following activities: development of treatment plan with patient and/or surrogate as well as nursing, discussions with consultants, evaluation of patient's response to treatment, examination of patient, obtaining history from patient or surrogate, ordering and performing treatments and interventions, ordering and review of laboratory studies, ordering and review of radiographic studies, pulse oximetry and re-evaluation of patient's condition.  CHA2DS2/VAS Stroke Risk Points  Current as of 15 minutes ago  4 >= 2 Points: High Risk  1 - 1.99 Points: Medium Risk  0 Points: Low Risk    This is the only CHA2DS2/VAS Stroke Risk Points available for the past  year.:  Last Change: N/A     Details    This score determines the patient's risk of having a stroke if the  patient has atrial fibrillation.       Points Metrics  1 Has Congestive Heart Failure:  Yes    Current as of 15 minutes ago  1 Has Vascular Disease:  Yes    Current as of 15 minutes ago  1 Has Hypertension:  Yes     Current as of 15 minutes ago  0 Age:  31    Current as of 15 minutes ago  0 Has Diabetes:  No    Current as of 15 minutes ago  0 Had Stroke:  No  Had TIA:  No  Had thromboembolism:  No    Current as of 15 minutes ago  1 Female:  Yes    Current as of 15 minutes ago           Final Clinical Impressions(s) / ED Diagnoses   Final diagnoses:  Atrial fibrillation with RVR (HCC)  Acute congestive heart failure, unspecified heart failure type [facility])    ED Discharge Orders    None       Gwyneth Sprout, MD 08/16/17 2050

## 2017-08-16 NOTE — Progress Notes (Signed)
Patient arrived to room 6E13 from ED.  Assessment complete, VS obtained, and Admission database began.

## 2017-08-17 ENCOUNTER — Encounter: Payer: Self-pay | Admitting: Physician Assistant

## 2017-08-17 DIAGNOSIS — E039 Hypothyroidism, unspecified: Secondary | ICD-10-CM

## 2017-08-17 DIAGNOSIS — Z8719 Personal history of other diseases of the digestive system: Secondary | ICD-10-CM

## 2017-08-17 DIAGNOSIS — K219 Gastro-esophageal reflux disease without esophagitis: Secondary | ICD-10-CM

## 2017-08-17 DIAGNOSIS — I110 Hypertensive heart disease with heart failure: Secondary | ICD-10-CM

## 2017-08-17 DIAGNOSIS — K449 Diaphragmatic hernia without obstruction or gangrene: Secondary | ICD-10-CM

## 2017-08-17 DIAGNOSIS — I2510 Atherosclerotic heart disease of native coronary artery without angina pectoris: Secondary | ICD-10-CM

## 2017-08-17 DIAGNOSIS — R1032 Left lower quadrant pain: Secondary | ICD-10-CM

## 2017-08-17 DIAGNOSIS — R198 Other specified symptoms and signs involving the digestive system and abdomen: Secondary | ICD-10-CM

## 2017-08-17 DIAGNOSIS — Z79899 Other long term (current) drug therapy: Secondary | ICD-10-CM

## 2017-08-17 DIAGNOSIS — Z7901 Long term (current) use of anticoagulants: Secondary | ICD-10-CM

## 2017-08-17 DIAGNOSIS — Z8249 Family history of ischemic heart disease and other diseases of the circulatory system: Secondary | ICD-10-CM

## 2017-08-17 DIAGNOSIS — Q210 Ventricular septal defect: Secondary | ICD-10-CM

## 2017-08-17 DIAGNOSIS — E876 Hypokalemia: Secondary | ICD-10-CM

## 2017-08-17 DIAGNOSIS — I480 Paroxysmal atrial fibrillation: Principal | ICD-10-CM

## 2017-08-17 DIAGNOSIS — Z881 Allergy status to other antibiotic agents status: Secondary | ICD-10-CM

## 2017-08-17 DIAGNOSIS — D72829 Elevated white blood cell count, unspecified: Secondary | ICD-10-CM

## 2017-08-17 DIAGNOSIS — Z8701 Personal history of pneumonia (recurrent): Secondary | ICD-10-CM

## 2017-08-17 DIAGNOSIS — Q969 Turner's syndrome, unspecified: Secondary | ICD-10-CM

## 2017-08-17 DIAGNOSIS — I080 Rheumatic disorders of both mitral and aortic valves: Secondary | ICD-10-CM

## 2017-08-17 DIAGNOSIS — G4733 Obstructive sleep apnea (adult) (pediatric): Secondary | ICD-10-CM

## 2017-08-17 DIAGNOSIS — Z79890 Hormone replacement therapy (postmenopausal): Secondary | ICD-10-CM

## 2017-08-17 DIAGNOSIS — I509 Heart failure, unspecified: Secondary | ICD-10-CM

## 2017-08-17 DIAGNOSIS — E871 Hypo-osmolality and hyponatremia: Secondary | ICD-10-CM

## 2017-08-17 LAB — COMPREHENSIVE METABOLIC PANEL
ALBUMIN: 3.4 g/dL — AB (ref 3.5–5.0)
ALK PHOS: 63 U/L (ref 38–126)
ALT: 54 U/L — ABNORMAL HIGH (ref 0–44)
ANION GAP: 13 (ref 5–15)
AST: 25 U/L (ref 15–41)
BUN: 16 mg/dL (ref 8–23)
CALCIUM: 8.4 mg/dL — AB (ref 8.9–10.3)
CO2: 28 mmol/L (ref 22–32)
Chloride: 88 mmol/L — ABNORMAL LOW (ref 98–111)
Creatinine, Ser: 0.77 mg/dL (ref 0.44–1.00)
GFR calc non Af Amer: >60 mL/min (ref >60)
GLUCOSE: 104 mg/dL — AB (ref 70–99)
POTASSIUM: 3.2 mmol/L — AB (ref 3.5–5.1)
SODIUM: 129 mmol/L — AB (ref 135–145)
Total Bilirubin: 2.2 mg/dL — ABNORMAL HIGH (ref 0.3–1.2)
Total Protein: 5.6 g/dL — ABNORMAL LOW (ref 6.5–8.1)

## 2017-08-17 LAB — T4, FREE: Free T4: 2.6 ng/dL — ABNORMAL HIGH (ref 0.82–1.77)

## 2017-08-17 LAB — BASIC METABOLIC PANEL
ANION GAP: 12 (ref 5–15)
Anion gap: 13 (ref 5–15)
BUN: 24 mg/dL — ABNORMAL HIGH (ref 8–23)
BUN: 24 mg/dL — AB (ref 8–23)
CALCIUM: 8.7 mg/dL — AB (ref 8.9–10.3)
CO2: 22 mmol/L (ref 22–32)
CO2: 24 mmol/L (ref 22–32)
Calcium: 8.8 mg/dL — ABNORMAL LOW (ref 8.9–10.3)
Chloride: 88 mmol/L — ABNORMAL LOW (ref 98–111)
Chloride: 89 mmol/L — ABNORMAL LOW (ref 98–111)
Creatinine, Ser: 1.17 mg/dL — ABNORMAL HIGH (ref 0.44–1.00)
Creatinine, Ser: 1.08 mg/dL — ABNORMAL HIGH (ref 0.44–1.00)
GFR calc Af Amer: >60 mL/min (ref >60)
GFR, EST AFRICAN AMERICAN: 57 mL/min — AB (ref >60)
GFR, EST NON AFRICAN AMERICAN: 49 mL/min — AB (ref >60)
GFR, EST NON AFRICAN AMERICAN: 54 mL/min — AB (ref >60)
GLUCOSE: 123 mg/dL — AB (ref 70–99)
Glucose, Bld: 169 mg/dL — ABNORMAL HIGH (ref 70–99)
POTASSIUM: 5.5 mmol/L — AB (ref 3.5–5.1)
Potassium: 4.9 mmol/L (ref 3.5–5.1)
Sodium: 122 mmol/L — ABNORMAL LOW (ref 135–145)
Sodium: 126 mmol/L — ABNORMAL LOW (ref 135–145)

## 2017-08-17 LAB — MRSA PCR SCREENING: MRSA by PCR: NEGATIVE

## 2017-08-17 LAB — CBC
HEMATOCRIT: 41 % (ref 36.0–46.0)
HEMOGLOBIN: 14.2 g/dL (ref 12.0–15.0)
MCH: 33.7 pg (ref 26.0–34.0)
MCHC: 34.6 g/dL (ref 30.0–36.0)
MCV: 97.4 fL (ref 78.0–100.0)
Platelets: 294 10*3/uL (ref 150–400)
RBC: 4.21 MIL/uL (ref 3.87–5.11)
RDW: 13.4 % (ref 11.5–15.5)
WBC: 16.9 10*3/uL — ABNORMAL HIGH (ref 4.0–10.5)

## 2017-08-17 LAB — HIV ANTIBODY (ROUTINE TESTING W REFLEX): HIV Screen 4th Generation wRfx: NONREACTIVE

## 2017-08-17 LAB — OSMOLALITY: OSMOLALITY: 269 mosm/kg — AB (ref 275–295)

## 2017-08-17 LAB — SODIUM, URINE, RANDOM: Sodium, Ur: <10 mmol/L

## 2017-08-17 LAB — OSMOLALITY, URINE: OSMOLALITY UR: 491 mosm/kg (ref 300–900)

## 2017-08-17 LAB — TSH: TSH: 0.263 u[IU]/mL — AB (ref 0.350–4.500)

## 2017-08-17 MED ORDER — PNEUMOCOCCAL VAC POLYVALENT 25 MCG/0.5ML IJ INJ
0.5000 mL | INJECTION | INTRAMUSCULAR | Status: AC
  Administered 2017-08-18: 0.5 mL via INTRAMUSCULAR
  Filled 2017-08-17: qty 0.5

## 2017-08-17 MED ORDER — FUROSEMIDE 10 MG/ML IJ SOLN: 40.0000 mg | Freq: Every day | INTRAMUSCULAR | Status: DC

## 2017-08-17 MED ORDER — DILTIAZEM HCL 60 MG PO TABS
90.0000 mg | ORAL_TABLET | Freq: Four times a day (QID) | ORAL | Status: DC
  Administered 2017-08-17 – 2017-08-18 (×4): 90 mg via ORAL
  Filled 2017-08-17 (×5): qty 2

## 2017-08-17 MED ORDER — POTASSIUM CHLORIDE CRYS ER 20 MEQ PO TBCR
40.0000 meq | EXTENDED_RELEASE_TABLET | Freq: Once | ORAL | Status: AC
  Administered 2017-08-17: 40 meq via ORAL
  Filled 2017-08-17: qty 2

## 2017-08-17 NOTE — Progress Notes (Addendum)
   Subjective: [name] is feeling okay this morning. Her mother is at bedside. Did not get much rest last night after she was admitted. She continues to endorse gagging sensation and sob but denies vomiting or heart palpitations. She would like to eat.   Objective:  Vital signs in last 24 hours: Vitals:   08/17/17 0110 08/17/17 0205 08/17/17 0628 08/17/17 0751  BP: 111/79 114/60 94/66 104/72  Pulse: 90 93 100 98  Resp: 16 (!) 24 16 (!) 25  Temp:   (!) 97.4 F (36.3 C)   TempSrc:   Oral Oral  SpO2: 100% 98% 98% 97%  Weight:   56.9 kg   Height:       Gen: Well appearing, NAD CV: irregularly irregular, tachycardic 100bpm, no murmurs Pulm: Normal effort, CTA throughout, no wheezing Abd: Soft, NT, ND, normal BS.  Ext: Warm, trace edema bilaterally Skin:  No rashes  Assessment/Plan:  Active Problems:   Atrial fibrillation with RVR (HCC)   62 year old female with history of Turner syndrom (severe AR and ?VSD), HTN, hypothyroidism and paroxysmal A. Fib (on metoprolol and Eliquis at home), presented to ED for shortness of breath and atrial fibrilation   #Atrial fibrillation with RVR: Presented with a.fib RVR at 180bpm. Patient has history of paroxysmal A. fib on metoprolol and Eliquis. Also h/o Turner'Dean syndrome with severe aortic regurgitation, EF 50%. . Rate controlled with Cardizem drip at ED. Leucocytosis 14.6, but doubt infection due to afebrile, CXR negative for pneumonia, lungs CTAB, urinalysis clean, no abdominal pain. TSH low and free T4 high this admission, could be cause for a.fib with RVR - EKG: recurrent a. Fib with RVR 180bpm - TSH: 0.26, free T4 2.60 - Rate <115 on Cardizem drip. Transition drip to po.  -Continue Eliquis 5 mg twice daily  #Hypothyroidism: TSH: 0.26, free T4 2.60. Appears she is over treated with home synthroid dose - discontinue synthroid - f/u outpatient to recheck TSH and free T4 and decide what dose of Synthroid to put her on  #Gagging,  GERD: 4 days of gagging. Endorses poor appetite, globus sensation and gagging after meals. Gagging could be caused from a.fib with RVR --> increased oxygen demand --> shortness of breath. Does have history of hiatal hernia and tortuous esophagus seen on barium swallow in 2016 but would expect an anatomical issue to cause more consistent symptoms. Evaluated by GI in 2016, EGD for esophageal dilation was considered but not performed because Ms. Oliveto'Dean symptoms were not that severe. - Speech pathology evaluation: rec'd regular barium swallow. Will hold off until GI evaluates - GI consulted:   #CHF: Euvolemic. TEE 06/2017 (EF 50-55%, severe AR, mild MR), heart cath 07/2017 with nonobstructive coronary disease. ?VSD...small VSD seen on echo but heart cath oximetry did not detect left to right shunt.  - Dean/p one dose of Lasix 40 mg IV with 1.1L output - Hold Metoprolol and spironolactone due to soft blood pressure - Hold home lasix 20mg   #HTN: mother states SBP is usually in the low 100s - holding home metoprolol and spironolactone as above  #LLQ pain, abdominal tenderness, nausea x few days+ leucocytosis.  - resolved by the time of my evaluation   #Hyponatremia: Na:128. likely 2/2 CHF (Grade 1 diastolic dysfunction). Dean/P 500 ml NaCl  -Repeat CMP   #Hypokalemia: -PO Kdur  -Repat BMP    Dispo: Anticipated discharge in approximately 1 day(Dean).   [name], Donna Antrim S, MD 08/17/2017, 10:02 AM Pager: [phone]

## 2017-08-17 NOTE — Discharge Instructions (Signed)

## 2017-08-17 NOTE — Evaluation (Addendum)
Clinical/Bedside Swallow Evaluation Patient Details  Name: [name] Rutt MRN: [id number] Date of Birth: 24-Dec-1955  Today's Date: 08/17/2017 Time: SLP Start Time (ACUTE ONLY): 0940 SLP Stop Time (ACUTE ONLY): 1010 SLP Time Calculation (min) (ACUTE ONLY): 30 min  Past Medical History:  Past Medical History:  Diagnosis Date  . Adiposity 06/04/2011  . Adult hypothyroidism 06/04/2011  . Aortic insufficiency    a. severe by echo, TEE 2019.  Marland Kitchen. 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 CAD (coronary artery disease)    a.  [facility] severe AI, nonobstructive CAD (50-60% prox-mid Cx, 40% prox-mid LAD, 30% prox RCA), normal PA pressures, no L-R shunt, LVEDP 18mmHg, EF 50%..  . Cervix abnormality 1993   MILD ATYPICAL ADENOMATOUS HYPERPLASIA OF CERVIX  . Chronic diastolic CHF (congestive heart failure) (HCC) 08/29/2014  . Chronic respiratory failure with hypercapnia (HCC) 07/06/2012   Followed in Pulmonary clinic/ [facility]  - 05/2012 > [facility] HCO3 33-35 on bmets c/w hypercarbia. - PSS 08/29/12 Severe obstructive sleep apnea/hypopnea syndrome, with an AHI of 79 > see OSA - Placed on 24h 02 3lpm at d/c 09/04/14 - stopped 12/2014     . Dermatitis 05-26-12   all over  since 9'13  . Difficult intubation    will plan on spinal anesthesia"small mouth/airway"  . Dyspnea   . Edema of both legs    a. right greater than left, due to Turner's syndrome.  . Gagging episode   . H/O total hip arthroplasty 06/04/2011  . Hyperglycemia 12/23/2011  . Hypertension   . Hypothyroidism   . LAD (lymphadenopathy), mediastinal 08/30/2014  . Obesity 09/16/2014  . Orbital floor (blow-out) closed fracture (HCC) 08/17/2011  . OSA (obstructive sleep apnea) 09/10/2012   NPSG 08/29/12:  Severe obstructive sleep apnea/hypopnea syndrome, with an AHI of 79  06/2016 Bipap 21/17  . Osteoarthritis 10/16/2010   Overview:  Dr. Charlann Boxerlin (hips and knees)   . PAF (paroxysmal atrial  fibrillation) (HCC)   . Periorbital edema   . 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 Severe aortic insufficiency   . Speech disorder    occ. "stutter"  . Supraventricular tachycardia (HCC) 08/27/2014  . Turner syndrome 06/04/2011  . Turner's syndrome   . Ventricular septal defect   . VSD (ventricular septal defect)    a. ? by TEE 06/2017, not seen on cardiac CT and [facility].   Past Surgical History:  Past Surgical History:  Procedure Laterality Date  . CARDIOVERSION  06/14/2017   Procedure: CARDIOVERSION;  Surgeon: Elease HashimotoNahser, Deloris PingPhilip J, MD;  [facility];  Service: Cardiovascular;;  . CERVICAL CONE BIOPSY  1993  . COLPOSCOPY    . DILATION AND CURETTAGE OF UTERUS  1993   CONE BIOPSY  . HERNIA REPAIR    . JOINT REPLACEMENT     hip replacement  . RIGHT/LEFT HEART CATH AND CORONARY ANGIOGRAPHY N/A 07/20/2017   Procedure: RIGHT/LEFT HEART CATH AND CORONARY ANGIOGRAPHY;  Surgeon: Lyn RecordsSmith, Henry W, MD;  Location: [facility] INVASIVE CV LAB;  Service: Cardiovascular;  Laterality: N/A;  . TEE WITHOUT CARDIOVERSION N/A 06/14/2017   Procedure: TRANSESOPHAGEAL ECHOCARDIOGRAM (TEE);  Surgeon: Elease HashimotoNahser, Deloris PingPhilip J, MD;  [facility];  Service: Cardiovascular;  Laterality: N/A;  . TOTAL HIP ARTHROPLASTY     LEFT  . TOTAL KNEE ARTHROPLASTY Right 05/31/2012   Procedure: RIGHT TOTAL KNEE ARTHROPLASTY;  Surgeon: Shelda PalMatthew D Olin, MD;  Location: [facility] ORS;  Service: Orthopedics;  Laterality: Right;  .  ULTRASOUND GUIDANCE FOR VASCULAR ACCESS  07/20/2017   Procedure: Ultrasound Guidance For Vascular Access;  Surgeon: Lyn RecordsSmith, Henry W, MD;  [facility];  Service: Cardiovascular;;  . US ECHOCARDIOGRAPHY  06/20/2009   EF 55-60%   HPI:  62 year old female admitted 08/16/17 with SOB and Afib. PMH: Paroxysmal AFib, Turner syndrome, VSD, aortic insufficiency, HTN, hypothyroid. CXR reveals no acute abnormality.  Assessment / Plan / Recommendation Clinical Impression  Pt seen at bedside for assessment of  swallow function and safety. CN exam is unremarkable, and pt presents with adequate natural dentition. Pt reports poor appetite, globus sensation and gagging after meals, which raise concern for esophageal issues. No oral deficits were noted during this assessment, and no overt s/s aspiration were observed on any consistency. Pt also did not exhibit gagging during this assessment.   Recommend consideration of a regular barium swallow (not modified barium swallow) to objectively evaluate the esophageal stage of the swallow for possible motility issues, and to determine if pt has reflux. She reports she has a hiatal hernia and reflux, however, her mother is quite adamant that she does not have reflux. SLP provided written strategies for management of esophageal dysmotility, and reviewed these with pt. SLP will follow briefly for education. RN and MD informed of results and recommendations.   SLP Visit Diagnosis: Dysphagia, unspecified (R13.10)    Aspiration Risk  Mild aspiration risk    Diet Recommendation Regular; Thin liquid   Liquid Administration via: Cup;Straw Medication Administration: Whole meds with liquid(large pills with puree) Supervision: Patient able to self feed;Intermittent supervision to cue for compensatory strategies Compensations: Minimize environmental distractions;Slow rate;Small sips/bites;Follow solids with liquid(follow strategies for management of esophageal dysmotility) Postural Changes: Seated upright at 90 degrees;Remain upright for at least 30 minutes after po intake    Other  Recommendations Recommended Consults: Consider esophageal assessment Oral Care Recommendations: Oral care QID   Follow up Recommendations (none anticipated)      Frequency and Duration min 1 x/week  1 week       Prognosis Prognosis for Safe Diet Advancement: Good      Swallow Study   General Date of Onset: 08/16/17 HPI: 62 year old female admitted 08/16/17 with SOB and Afib. PMH:  Paroxysmal AFib, Turner syndrome, VSD, aortic insufficiency, HTN, hypothyroid. Type of Study: Bedside Swallow Evaluation Previous Swallow Assessment: BSE October 2016 - rec dys 3 (mech soft) and thin liquids. Diet Prior to this Study: Regular;Thin liquids Temperature Spikes Noted: No Respiratory Status: Room air History of Recent Intubation: No Behavior/Cognition: Alert;Cooperative;Pleasant mood Oral Cavity Assessment: Within Functional Limits Oral Care Completed by SLP: No Oral Cavity - Dentition: Adequate natural dentition Vision: Functional for self-feeding Self-Feeding Abilities: Able to feed self Patient Positioning: Upright in bed Baseline Vocal Quality: Normal Volitional Cough: Strong Volitional Swallow: Able to elicit    Oral/Motor/Sensory Function Overall Oral Motor/Sensory Function: Within functional limits   Ice Chips Ice chips: Not tested   Thin Liquid Thin Liquid: Within functional limits Presentation: Straw    Nectar Thick Nectar Thick Liquid: Not tested   Honey Thick Honey Thick Liquid: Not tested   Puree Puree: Within functional limits Presentation: Spoon;Self Fed   Solid     Solid: Within functional limits Presentation: Self Fed     Celia B. Murvin NatalBueche, [facility] CenterMSP, CCC-SLP Speech Language Pathologist [phone]  Leigh AuroraBueche, Celia Brown 08/17/2017,10:13 AM

## 2017-08-17 NOTE — Progress Notes (Deleted)
Cardiology Office Note    Date:  08/17/2017  ID:  [name], DOB 1955-06-30, MRN [id number] PCP:  Tracey HarriesBouska, David, MD  Cardiologist:  Kristeen MissPhilip Nahser, MD   Chief Complaint: f/u AI, afib  History of Present Illness:  [name] is a 62 y.o. female with history of paroxysmal atrial fib, Turner's syndrome, aortic insufficiency, CAD, chronic diastolic CHF, OSA, HTN, HLD, hypothyroidism, stutter who presents for follow-up. Workup earlier this year showed progression to severe AI. 2D echo 05/05/17 showed EF 55-60%, grade 1 DD, severe AI, moderate LAE. TEE 06/14/17 showed EF 50-55%, severe AI, mild MR.  The procedure was aborted early due to airway concerns and SVT which required cardioversion. There was also a small - moderate sized perimembranous VSD. Cardiac CT overread showed small pulm nodule in LUL statistically likely benign (radiology recommended f/u CT 12 months if high risk, no f/u if low risk). Calcium score 896 (99th percentile), poor quality coronary study due to AI, "Ascending Aortic root 3.5 cm with abnormal appearing arch which is elongated and horizontal with 'pseudo coarctation' tapering from 19 mm to 15 mm in it's distal portion before descending." No perimembranous VSD was noted. [facility] severe AI, nonobstructive CAD (50-60% prox-mid Cx, 40% prox-mid LAD, 30% prox RCA), normal PA pressures, no L-R shunt, LVEDP 18mmHg, EF 50%. She saw Dr. Laneta SimmersBartle 08/09/17 who felt her AI halftime pressures still represented moderate AI and did not think there was a clear indication for surgery given relatively asymptomatic nature. Labs 08/2016: K 3.2, Cr 0.77, albumin 3.4, ALT 54, WBC 16.9, Hgb 14.2, TSH suppressed and free T4 elevated, 05/2017 LDL wnl.  tsh, lfts Comment vsd admitted   Aortic insufficiency CAD Paroxysmal atrial fib Chronic diastolic CHF    Past Medical History:  Diagnosis Date  . Adiposity 06/04/2011  . Adult hypothyroidism 06/04/2011  . Aortic insufficiency    a.  severe by echo, TEE 2019.  Marland Kitchen. 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 CAD (coronary artery disease)    a.  [facility] showed severe AI, nonobstructive CAD (50-60% prox-mid Cx, 40% prox-mid LAD, 30% prox RCA), normal PA pressures, no L-R shunt, LVEDP 18mmHg, EF 50%..  . Cervix abnormality 1993   MILD ATYPICAL ADENOMATOUS HYPERPLASIA OF CERVIX  . Chronic diastolic CHF (congestive heart failure) (HCC) 08/29/2014  . Chronic respiratory failure with hypercapnia (HCC) 07/06/2012   Followed in Pulmonary clinic/ [facility]  - 05/2012 [facility] HCO3 33-35 on bmets c/w hypercarbia. - PSS 08/29/12 Severe obstructive sleep apnea/hypopnea syndrome, with an AHI of 79 > see OSA - Placed on 24h 02 3lpm at d/c 09/04/14 - stopped 12/2014     . Dermatitis 05-26-12   all over  since 9'13  . Difficult intubation    will plan on spinal anesthesia"small mouth/airway"  . Dyspnea   . Edema of both legs    a. right greater than left, due to Turner's syndrome.  . Gagging episode   . H/O total hip arthroplasty 06/04/2011  . Hyperglycemia 12/23/2011  . Hypertension   . Hypothyroidism   . LAD (lymphadenopathy), mediastinal 08/30/2014  . Obesity 09/16/2014  . Orbital floor (blow-out) closed fracture (HCC) 08/17/2011  . OSA (obstructive sleep apnea) 09/10/2012   NPSG 08/29/12:  Severe obstructive sleep apnea/hypopnea syndrome, with an AHI of 79  06/2016 Bipap 21/17  . Osteoarthritis 10/16/2010   Overview:  Dr. Charlann Boxerlin (hips and knees)   . PAF (paroxysmal atrial fibrillation) (  HCC)   . Periorbital edema   . 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 Severe aortic insufficiency   . Speech disorder    occ. "stutter"  . Supraventricular tachycardia (HCC) 08/27/2014  . Turner syndrome 06/04/2011  . Turner's syndrome   . Ventricular septal defect   . VSD (ventricular septal defect)    a. ? by TEE 06/2017, not seen on cardiac CT and no shunt by [facility].    Past Surgical History:  Procedure  Laterality Date  . CARDIOVERSION  06/14/2017   Procedure: CARDIOVERSION;  Surgeon: Elease Hashimoto Deloris Ping, MD;  [facility] ENDOSCOPY;  Service: Cardiovascular;;  . CERVICAL CONE BIOPSY  1993  . COLPOSCOPY    . DILATION AND CURETTAGE OF UTERUS  1993   CONE BIOPSY  . HERNIA REPAIR    . JOINT REPLACEMENT     hip replacement  . RIGHT/LEFT HEART CATH AND CORONARY ANGIOGRAPHY N/A 07/20/2017   Procedure: RIGHT/LEFT HEART CATH AND CORONARY ANGIOGRAPHY;  Surgeon: Lyn Records, MD;  Location: [facility] INVASIVE CV LAB;  Service: Cardiovascular;  Laterality: N/A;  . TEE WITHOUT CARDIOVERSION N/A 06/14/2017   Procedure: TRANSESOPHAGEAL ECHOCARDIOGRAM (TEE);  Surgeon: Elease Hashimoto Deloris Ping, MD;  Location: [facility] ENDOSCOPY;  Service: Cardiovascular;  Laterality: N/A;  . TOTAL HIP ARTHROPLASTY     LEFT  . TOTAL KNEE ARTHROPLASTY Right 05/31/2012   Procedure: RIGHT TOTAL KNEE ARTHROPLASTY;  Surgeon: Shelda Pal, MD;  Location: [facility] ORS;  Service: Orthopedics;  Laterality: Right;  . ULTRASOUND GUIDANCE FOR VASCULAR ACCESS  07/20/2017   Procedure: Ultrasound Guidance For Vascular Access;  Surgeon: Lyn Records, MD;  Location: [facility] INVASIVE CV LAB;  Service: Cardiovascular;;  . US ECHOCARDIOGRAPHY  06/20/2009   EF 55-60%    Current Medications: No outpatient medications have been marked as taking for the 08/18/17 encounter (Appointment) with Laurann Montana, PA-C.   ***   Allergies:   Cephalexin   Social History   Socioeconomic History  . Marital status: Single    Spouse name: Not on file  . Number of children: Not on file  . Years of education: Not on file  . Highest education level: Not on file  Occupational History  . Occupation: N/A    Employer: NOT EMPLOYED  Social Needs  . Financial resource strain: Not on file  . Food insecurity:    Worry: Not on file    Inability: Not on file  . Transportation needs:    Medical: Not on file    Non-medical: Not on file  Tobacco Use  . Smoking status: Never Smoker  .  Smokeless tobacco: Never Used  Substance and Sexual Activity  . Alcohol use: No  . Drug use: No  . Sexual activity: Never    Birth control/protection: Post-menopausal  Lifestyle  . Physical activity:    Days per week: Not on file    Minutes per session: Not on file  . Stress: Not on file  Relationships  . Social connections:    Talks on phone: Not on file    Gets together: Not on file    Attends religious service: Not on file    Active member of club or organization: Not on file    Attends meetings of clubs or organizations: Not on file    Relationship status: Not on file  Other Topics Concern  . Not on file  Social History Narrative  . Not on file     Family History:  The patient's ***family history includes Atrial  fibrillation in her mother; Breast cancer in her cousin; Cancer in her mother; Diabetes in her maternal uncle; Hypertension in her mother.  ROS:   Please see the history of present illness. Otherwise, review of systems is positive for ***.  All other systems are reviewed and otherwise negative.    PHYSICAL EXAM:   VS:  There were no vitals taken for this visit.  BMI: There is no height or weight on file to calculate BMI. GEN: Well nourished, well developed, in no acute distress HEENT: normocephalic, atraumatic Neck: no JVD, carotid bruits, or masses Cardiac: ***RRR; no murmurs, rubs, or gallops, no edema  Respiratory:  clear to auscultation bilaterally, normal work of breathing GI: soft, nontender, nondistended, + BS MS: no deformity or atrophy Skin: warm and dry, no rash Neuro:  Alert and Oriented x 3, Strength and sensation are intact, follows commands Psych: euthymic mood, full affect  Wt Readings from Last 3 Encounters:  08/17/17 125 lb 7.1 oz (56.9 kg)  08/09/17 126 lb 3.2 oz (57.2 kg)  07/20/17 123 lb (55.8 kg)      Studies/Labs Reviewed:   EKG:  EKG was ordered today and personally reviewed by me and demonstrates *** EKG was not ordered  today.***  Recent Labs: 08/16/2017: B Natriuretic Peptide 1,720.4; Magnesium 2.0; TSH 0.263 08/17/2017: ALT 54; BUN 16; Creatinine, Ser 0.77; Hemoglobin 14.2; Platelets 294; Potassium 3.2; Sodium 129   Lipid Panel    Component Value Date/Time   CHOL 119 05/05/2017 1002   TRIG 80 05/05/2017 1002   HDL 49 05/05/2017 1002   CHOLHDL 2.4 05/05/2017 1002   CHOLHDL 6.3 08/29/2014 0800   VLDL 33 08/29/2014 0800   LDLCALC 54 05/05/2017 1002    Additional studies/ records that were reviewed today include: Summarized above.***    ASSESSMENT & PLAN:   1. ***  Disposition: F/u with ***   Medication Adjustments/Labs and Tests Ordered: Current medicines are reviewed at length with the patient today.  Concerns regarding medicines are outlined above. Medication changes, Labs and Tests ordered today are summarized above and listed in the Patient Instructions accessible in Encounters.   Signed, Laurann Montanaayna N Yanni Ruberg, PA-C  08/17/2017 8:27 AM    [facility] [address], [place], [place]  [address] Phone: [phone]; Fax: [phone]

## 2017-08-17 NOTE — Progress Notes (Signed)
Pt set up on auto settings for BIPAP for the night with a small face mask.  Pt tolerating well.

## 2017-08-18 ENCOUNTER — Ambulatory Visit: Payer: Medicaid Other | Admitting: Physician Assistant

## 2017-08-18 ENCOUNTER — Inpatient Hospital Stay (HOSPITAL_COMMUNITY): Payer: Medicaid Other

## 2017-08-18 DIAGNOSIS — I5032 Chronic diastolic (congestive) heart failure: Secondary | ICD-10-CM

## 2017-08-18 LAB — BASIC METABOLIC PANEL
Anion gap: 10 (ref 5–15)
BUN: 21 mg/dL (ref 8–23)
CO2: 26 mmol/L (ref 22–32)
Calcium: 9 mg/dL (ref 8.9–10.3)
Chloride: 91 mmol/L — ABNORMAL LOW (ref 98–111)
Creatinine, Ser: 0.81 mg/dL (ref 0.44–1.00)
GFR calc non Af Amer: >60 mL/min (ref >60)
Glucose, Bld: 104 mg/dL — ABNORMAL HIGH (ref 70–99)
POTASSIUM: 4.6 mmol/L (ref 3.5–5.1)
SODIUM: 127 mmol/L — AB (ref 135–145)

## 2017-08-18 LAB — T3: T3, Total: 65 ng/dL — ABNORMAL LOW (ref 71–180)

## 2017-08-18 LAB — SAVE SMEAR

## 2017-08-18 LAB — SEDIMENTATION RATE: SED RATE: 1 mm/h (ref 0–22)

## 2017-08-18 LAB — C-REACTIVE PROTEIN

## 2017-08-18 MED ORDER — METOPROLOL TARTRATE 25 MG PO TABS
25.0000 mg | ORAL_TABLET | Freq: Two times a day (BID) | ORAL | Status: DC
  Administered 2017-08-18 – 2017-08-20 (×4): 25 mg via ORAL
  Filled 2017-08-18 (×5): qty 1

## 2017-08-18 MED ORDER — ACETAMINOPHEN 325 MG PO TABS: 650.0000 mg | ORAL_TABLET | Freq: Four times a day (QID) | ORAL | Status: DC | PRN

## 2017-08-18 NOTE — Progress Notes (Signed)
Subjective: [name] is feeling well this morning. Her mother is at bedside. She denies abdominal pain and says her gagging resolved yesterday afternoon. She does endorse a sensation of food getting stuck. Discussed the plan to consult GI and put her back on her home metoprolol. She is still in a. Fib, will need to at least get her rate controlled.  Objective:  Vital signs in last 24 hours: Vitals:   08/18/17 1042 08/18/17 1051 08/18/17 1054 08/18/17 1055  BP: 93/80 (!) 99/56 96/70 101/61  Pulse: (!) 53 (!) 42 (!) 40 (!) 35  Resp:  (!) 31 (!) 26 (!) 30  Temp:      TempSrc:      SpO2: 96% 92% 94% 99%  Weight:      Height:       Gen: Well appearing, NAD CV: irregularly irregular, tachycardic 120bpm, no murmurs Pulm: Normal effort, CTA throughout, no wheezing Abd: Soft, NT, ND, normal BS.  Ext: Warm, no edema   Assessment/Plan:  Active Problems:   Aortic insufficiency   Adult hypothyroidism   Turner syndrome   PAF (paroxysmal atrial fibrillation) (HCC)   Chronic diastolic CHF (congestive heart failure) (HCC)   Gagging episode   Atrial fibrillation with RVR (HCC)   62 year old female with history of Turner syndrom (severe AR and ?VSD), HTN, hypothyroidism and paroxysmal A. Fib (on metoprolol and Eliquis at home), presented to ED for shortness of breath and atrial fibrilation   #Atrial fibrillation with RVR: Remains in a. Fib today with rates around 115. Presented with a.fib RVR at 180bpm. Patient has history of paroxysmal A. fib on metoprolol and Eliquis. Also h/o Turner's syndrome with severe aortic regurgitation, EF 50%. Rate controlled with Cardizem drip at ED. Leucocytosis 14.6, but doubt infection due to afebrile, CXR negative for pneumonia, lungs CTAB, urinalysis clean, no abdominal pain. TSH low and free T4 high this admission, could be cause for a.fib with RVR - EKG: discussed with cardiology this morning. Remains in a. Fib at 109 bpm - TSH: 0.26, free T4 2.60 -  Transitioned to po cardizem 8/13: held today due to bradycardia  -Continue Eliquis 5 mg twice daily - Start back home metoprolol 25mg  BID: after starting metoprolol, she had bradycardia to 35 with BP 96/70. Asymptomatic. Remained A&O. Will discontinue cardizem and reevaluate later today  #Hypothyroidism: TSH: 0.26, free T4 2.60. Appears she is over treated with home synthroid dose - discontinue synthroid - f/u outpatient to recheck TSH and free T4 and decide what dose of Synthroid to put her on  #Gagging, GERD: Reports gagging resolved yesterday afternoon but she presented with 4 days of gagging. Endorses poor appetite, globus sensation and gagging after meals. Gagging could be caused from a.fib with RVR --> increased oxygen demand --> shortness of breath. Does have history of hiatal hernia and tortuous esophagus seen on barium swallow in 2016 but would expect an anatomical issue to cause more consistent symptoms. Evaluated by GI in 2016, EGD for esophageal dilation was considered but not performed because [name]'s symptoms were not that severe. - Speech pathology evaluation: rec'd regular barium swallow. Will hold off until GI evaluates - GI consulted: [facility] will see her  #CHF: Euvolemic. TEE 06/2017 (EF 50-55%, severe AR, mild MR), heart cath 07/2017 with nonobstructive coronary disease. ?VSD...small VSD seen on echo but heart cath oximetry did not detect left to right shunt.  - s/p one dose of Lasix 40 mg IV with 1.1L output - Hold Metoprolol and  spironolactone due to soft blood pressure - Hold home lasix 20mg   #HTN: mother states SBP is usually in the low 100s - holding home metoprolol and spironolactone as above  #LLQ pain, abdominal tenderness, nausea x few days+ leucocytosis.  - resolved by the time of my evaluation   #Hyponatremia: Na:128. likely 2/2 CHF (Grade 1 diastolic dysfunction). S/P 500 ml NaCl  -Repeat CMP   #Hypokalemia: -PO Kdur  -Repat BMP    Dispo:  Anticipated discharge in approximately 1 day(s).   [name], Damarco Keysor S, MD 08/18/2017, 11:52 AM Pager: [phone]

## 2017-08-18 NOTE — Discharge Summary (Signed)
Name: [name] MRN: [id number] DOB: [birth year] 62 y.o. PCP: Tracey Harries, MD  Date of Admission: 08/16/2017  6:10 PM Date of Discharge: 08/20/17 Attending Physician: Tyson Alias, *  Discharge Diagnosis: 1. A. Fib with RVR  Discharge Medications: Allergies as of 08/20/2017      Reactions   Cephalexin Rash, Other (See Comments)   Prefers not to have ? Psoriasis skin condition   Latex Rash      Medication List    STOP taking these medications   spironolactone 25 MG tablet Commonly known as:  ALDACTONE     TAKE these medications   acetaminophen 500 MG tablet Commonly known as:  TYLENOL Take 1,000 mg by mouth every 6 (six) hours as needed for moderate pain or headache.   atorvastatin 20 MG tablet Commonly known as:  LIPITOR Take 1 tablet (20 mg total) by mouth daily.   ELIQUIS 5 MG Tabs tablet Generic drug:  apixaban TAKE 1 TABLET BY MOUTH TWICE DAILY   famotidine 40 MG tablet Commonly known as:  PEPCID Take 40 mg by mouth 2 (two) times daily.   furosemide 20 MG tablet Commonly known as:  LASIX Only take if you have a 3lb weight gain in 24 hours or 5lb in 1 week, shortness of breath, with or without a dry hacking cough, swelling in the hands, feet or stomach, or if you have to sleep on extra pillows at night in order to breathe What changed:  additional instructions   levothyroxine 75 MCG tablet Commonly known as:  SYNTHROID, LEVOTHROID Take 1 tablet (75 mcg total) by mouth daily before breakfast. Start taking on:  08/21/2017 What changed:    medication strength  how much to take  when to take this   metoprolol tartrate 25 MG tablet Commonly known as:  LOPRESSOR TAKE 1 TABLET BY MOUTH TWICE DAILY   potassium chloride 10 MEQ tablet Commonly known as:  K-DUR Take 1 tablet (10 mEq total) by mouth daily.   traMADol 50 MG tablet Commonly known as:  ULTRAM Take 50 mg by mouth every 6 (six) hours as needed for moderate pain.   Vitamin D3  2000 units capsule Take 2,000 Units by mouth daily.       Disposition and follow-up:   [name] was discharged from [facility] in Good condition.  At the hospital follow up visit please address:  1.  At discharge, spironolactone was held due to hypotension and lasix was made prn. TSH was low and free T4 high so Synthroid was decreased to (from )  2.  Labs / imaging needed at time of follow-up: BMP to f/u hyponatremia   3.  Pending labs/ test needing follow-up: none  Follow-up Appointments: Recommended [name] schedule a hospital follow up with her Cardiologist   Hospital Course by problem list:  1. A. Fib with RVR: HR up to 180 on presentation with associated shortness of breath and gagging. She did not require supplemental oxygen. She initially required cardizem drip for rate control but was able to transition to po cardizem and then discontinue. She remained in a. Fib/a. Flutter and was discharged on her home dose of metoprolol with close cardiology follow up.   2. Hyperthyroid: She was also found to be hyperthyroid, raising concern for over treatment of hypothyroidism contributing to her a. Fib with RVR. Her home synthroid dose was decreased from to .   3. Gagging: GI was consulted. Barium swallow showed  mild diffuse esophageal dilatation and suggestion of mild esophageal dysmotility in a tortuous esophagus and ,once again, a barium tablet did not pass into the stomach.  No definite stricture seen due to technical issues since the gastric fundus was obscured on the esophagram images. No overt aspiration. Gagging symptom self resolved and GI recommended outpatient follow up.   4. Hyponatremia: Chronic. 128 on admission. Asymptomatic. Worsened to 122 after receiving IV lasix in the ED. Urine and serum studies consistent with either increased vascular volume or decreased solute intake. Sodium improved to 132 with IVFs.   5. CHF/HTN:  Euvolemic to hypovolemic during admission. Held spironlactone and lasix due to hypotension. On discharge, spironolactone was discontinued and lasix was prescribed prn.   Discharge Vitals:   BP (!) 95/57 (BP Location: Right Arm)   Pulse 73   Temp 98.4 F (36.9 C) (Oral)   Resp 20   Ht 4\' 8"  (1.422 m)   Wt 57.2 kg   SpO2 97%   BMI 28.27 kg/m   Pertinent Labs, Studies, and Procedures:  Barium swallow:  - Mild esophageal dysmotility and dilatation - Incompletely evaluated stricture distal esophagus. Barium tablet did not pass into the stomach.  TSH: 0.263 Free T4: 2.60  Discharge Instructions: Discharge Instructions    (HEART FAILURE PATIENTS) Call MD:  Anytime you have any of the following symptoms: 1) 3 pound weight gain in 24 hours or 5 pounds in 1 week 2) shortness of breath, with or without a dry hacking cough 3) swelling in the hands, feet or stomach 4) if you have to sleep on extra pillows at night in order to breathe.   Complete by:  As directed    Diet - low sodium heart healthy   Complete by:  As directed    Discharge instructions   Complete by:  As directed    [name],  You were admitted to the hospital because your heart was beating too fast. We were able to get your heart under control with an IV medicine and transition back to your home metoprolol. We think you were taking too much thyroid medicine which increased your heart rate.   Please decrease the amount of Synthroid (levothyroxine) you take to 75mcg. I have sent a new prescription for this lower dose to your pharmacy. When you follow up with your primary care doctor, ask them to recheck your thyroid labs and adjust the medication if necessary.  For your blood pressure, please stop taking Spironolactone. Your blood pressure has been a little low and I don't think you need this medicine right now.  I'd also like you to only take the Lasix (furosemide) as needed. This medicine can also lower your blood  pressure and I don't want your blood pressure to get so low that you pass out. Some instructions for when to take the Lasix (furosemide) are as follows:  Anytime you have any of the following symptoms: Take one dose of Lasix every 8 hours until symptoms have resolved.   1) 3 pound weight gain in 24 hours or 5 pounds in 1 week   2) shortness of breath, with or without a dry hacking cough   3) swelling in the hands, feet or stomach   4) if you have to sleep on extra pillows at night in order to breathe  If you have any questions, please call our Internal Medicine Teaching Program at [phone] to get in touch with one of the doctors who took care of you while  in the hospital.   Increase activity slowly   Complete by:  As directed       Signed: Ali LoweVogel, Wave Calzada S, MD 08/20/2017, 2:16 PM   Pager: [phone]

## 2017-08-18 NOTE — Progress Notes (Signed)
Medicine attending: I examined this patient today together with resident physician Dr Maryelizabeth KaufmannMarie Vogel and I concur with her evaluation and management plan. Persistent atrial fibrillation rates up to 120.  No cardiac symptoms.  Transition from parenteral to oral Cardizem.  We will add back her beta-blocker at same dose as prior to admission. Additional episodes of gagging yesterday per her mother's history.  GI to consult.  Previous distal esophageal stricture by barium swallow criteria.  Anticipate she will need upper endoscopy and dilation of stricture if confirmed once cardiac status is more stable. Hyponatremia slightly improved.  Serum osmolarity decreased.  Urine osmolarity normal.  Urine sodium less than 10.  Suggest intravascular volume increased or decreased solute in diet. Patient's mother ever present.  Status and management plan reviewed.

## 2017-08-18 NOTE — Consult Note (Signed)
Subjective:   HPI  The patient is a 62 year old female who we were asked to see in regards to gagging.  Patient states that she gags periodically.  Does not necessarily have to occur when she is eating.  Denies food sticking when she swallows.  Family says she can gag any time whether she eats or not but it is intermittent and she is done it for a long time.  In 2016 she had a barium swallow showing a tortuous esophagus with a moderate size hiatal hernia.  There was a smooth narrowing at the gastroesophageal junction.  There was mild stasis in the bilateral piriform sinuses and vallecula without frank aspiration.  She never did have an endoscopy.  She follows regularly with Dr. Ewing SchleinMagod.  Review of Systems No chest pain or shortness of breath  Past Medical History:  Diagnosis Date  . Adiposity 06/04/2011  . Adult hypothyroidism 06/04/2011  . Aortic insufficiency    a. severe by echo, TEE 2019.  [facility] Kitchen. 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 CAD (coronary artery disease)    a.  [facility] showed severe AI, nonobstructive CAD (50-60% prox-mid Cx, 40% prox-mid LAD, 30% prox RCA), normal PA pressures, no L-R shunt, LVEDP 18mmHg, EF 50%..  . Cervix abnormality 1993   MILD ATYPICAL ADENOMATOUS HYPERPLASIA OF CERVIX  . Chronic diastolic CHF (congestive heart failure) (HCC) 08/29/2014  . Chronic respiratory failure with hypercapnia (HCC) 07/06/2012   Followed in Pulmonary clinic/ [facility]  - 05/2012 > [facility] HCO3 33-35 on bmets c/w hypercarbia. - PSS 08/29/12 Severe obstructive sleep apnea/hypopnea syndrome, with an AHI of 79 > see OSA - Placed on 24h 02 3lpm at d/c 09/04/14 - stopped 12/2014     . Dermatitis 05-26-12   all over  since 9'13  . Difficult intubation    will plan on spinal anesthesia"small mouth/airway"  . Dyspnea   . Edema of both legs    a. right greater than left, due to Turner's syndrome.  . Gagging episode   . H/O total hip  arthroplasty 06/04/2011  . Hyperglycemia 12/23/2011  . Hypertension   . Hypothyroidism   . LAD (lymphadenopathy), mediastinal 08/30/2014  . Obesity 09/16/2014  . Orbital floor (blow-out) closed fracture (HCC) 08/17/2011  . OSA (obstructive sleep apnea) 09/10/2012   NPSG 08/29/12:  Severe obstructive sleep apnea/hypopnea syndrome, with an AHI of 79  06/2016 Bipap 21/17  . Osteoarthritis 10/16/2010   Overview:  Dr. Charlann Boxerlin (hips and knees)   . PAF (paroxysmal atrial fibrillation) (HCC)   . Periorbital edema   . 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 Severe aortic insufficiency   . Speech disorder    occ. "stutter"  . Supraventricular tachycardia (HCC) 08/27/2014  . Turner syndrome 06/04/2011  . Turner's syndrome   . Ventricular septal defect   . VSD (ventricular septal defect)    a. ? by TEE 06/2017, not seen on cardiac CT and [facility].   Past Surgical History:  Procedure Laterality Date  . CARDIOVERSION  06/14/2017   Procedure: CARDIOVERSION;  Surgeon: Elease HashimotoNahser, Deloris PingPhilip J, MD;  [facility];  Service: Cardiovascular;;  . CERVICAL CONE BIOPSY  1993  . COLPOSCOPY    . DILATION AND CURETTAGE OF UTERUS  1993   CONE BIOPSY  . HERNIA REPAIR    . JOINT REPLACEMENT     hip replacement  . RIGHT/LEFT HEART CATH AND CORONARY ANGIOGRAPHY N/A 07/20/2017   Procedure: RIGHT/LEFT HEART CATH  AND CORONARY ANGIOGRAPHY;  Surgeon: Lyn RecordsSmith, Henry W, MD;  [facility];  Service: Cardiovascular;  Laterality: N/A;  . TEE WITHOUT CARDIOVERSION N/A 06/14/2017   Procedure: TRANSESOPHAGEAL ECHOCARDIOGRAM (TEE);  Surgeon: Elease HashimotoNahser, Deloris PingPhilip J, MD;  [facility];  Service: Cardiovascular;  Laterality: N/A;  . TOTAL HIP ARTHROPLASTY     LEFT  . TOTAL KNEE ARTHROPLASTY Right 05/31/2012   Procedure: RIGHT TOTAL KNEE ARTHROPLASTY;  Surgeon: Shelda PalMatthew D Olin, MD;  Location: [facility] ORS;  Service: Orthopedics;  Laterality: Right;  . ULTRASOUND GUIDANCE FOR VASCULAR ACCESS  07/20/2017   Procedure: Ultrasound  Guidance For Vascular Access;  Surgeon: Lyn RecordsSmith, Henry W, MD;  [facility];  Service: Cardiovascular;;  . US ECHOCARDIOGRAPHY  06/20/2009   EF 55-60%   Social History   Socioeconomic History  . Marital status: Single    Spouse name: Not on file  . Number of children: Not on file  . Years of education: Not on file  . Highest education level: Not on file  Occupational History  . Occupation: N/A    Employer: NOT EMPLOYED  Social Needs  . Financial resource strain: Not on file  . Food insecurity:    Worry: Not on file    Inability: Not on file  . Transportation needs:    Medical: Not on file    Non-medical: Not on file  Tobacco Use  . Smoking status: Never Smoker  . Smokeless tobacco: Never Used  Substance and Sexual Activity  . Alcohol use: No  . Drug use: No  . Sexual activity: Never    Birth control/protection: Post-menopausal  Lifestyle  . Physical activity:    Days per week: Not on file    Minutes per session: Not on file  . Stress: Not on file  Relationships  . Social connections:    Talks on phone: Not on file    Gets together: Not on file    Attends religious service: Not on file    Active member of club or organization: Not on file    Attends meetings of clubs or organizations: Not on file    Relationship status: Not on file  . Intimate partner violence:    Fear of current or ex partner: Not on file    Emotionally abused: Not on file    Physically abused: Not on file    Forced sexual activity: Not on file  Other Topics Concern  . Not on file  Social History Narrative  . Not on file   family history includes Atrial fibrillation in her mother; Breast cancer in her cousin; Cancer in her mother; Diabetes in her maternal uncle; Hypertension in her mother.  Current Facility-Administered Medications:  .  acetaminophen (TYLENOL) tablet 650 mg, 650 mg, Oral, Q6H PRN, Ali LoweVogel, Marie S, MD .  apixaban Everlene Balls(ELIQUIS) tablet 5 mg, 5 mg, Oral, BID, Lanelle BalHarbrecht,  Lawrence, MD, 5 mg at 08/18/17 0937 .  atorvastatin (LIPITOR) tablet 20 mg, 20 mg, Oral, Daily, Lanelle BalHarbrecht, Lawrence, MD, 20 mg at 08/18/17 0937 .  metoprolol tartrate (LOPRESSOR) tablet 25 mg, 25 mg, Oral, BID, Claudean SeveranceKrienke, Marissa M, MD, 25 mg at 08/18/17 1002 .  potassium chloride (K-DUR,KLOR-CON) CR tablet 10 mEq, 10 mEq, Oral, Daily, Lanelle BalHarbrecht, Lawrence, MD, 10 mEq at 08/18/17 [id number] Allergies  Allergen Reactions  . Cephalexin Rash and Other (See Comments)    Prefers not to have ? Psoriasis skin condition  . Latex Rash     Objective:     BP  112/78 (BP Location: Right Arm)   Pulse 65   Temp 97.6 F (36.4 C) (Oral)   Resp 20   Ht 4\' 8"  (1.422 m)   Wt 56.9 kg   SpO2 90%   BMI 28.12 kg/m   No distress  Heart regular rhythm  Lungs clear  Abdomen soft and nontender  Laboratory No components found for: D1    Assessment:     Gagging      Plan:     I am not sure what this gagging is from.  It appears to have been chronic but flared up again recently.  I will do a follow-up barium swallow since the last one was 3 years ago.  She actually denies food stick when she swallows so I am not sure if the prior findings on barium swallow are of clinical significance.  Also of note is that she gags whether she is eating or not.  Not sure at this time whether endoscopy would be of benefit.  We will check the barium swallow.

## 2017-08-19 DIAGNOSIS — I351 Nonrheumatic aortic (valve) insufficiency: Secondary | ICD-10-CM

## 2017-08-19 DIAGNOSIS — K224 Dyskinesia of esophagus: Secondary | ICD-10-CM

## 2017-08-19 LAB — BASIC METABOLIC PANEL
Anion gap: 7 (ref 5–15)
BUN: 20 mg/dL (ref 8–23)
CALCIUM: 8.4 mg/dL — AB (ref 8.9–10.3)
CO2: 25 mmol/L (ref 22–32)
CREATININE: 0.73 mg/dL (ref 0.44–1.00)
Chloride: 96 mmol/L — ABNORMAL LOW (ref 98–111)
GFR calc Af Amer: >60 mL/min (ref >60)
GFR calc non Af Amer: >60 mL/min (ref >60)
GLUCOSE: 109 mg/dL — AB (ref 70–99)
Potassium: 4.8 mmol/L (ref 3.5–5.1)
Sodium: 128 mmol/L — ABNORMAL LOW (ref 135–145)

## 2017-08-19 MED ORDER — DILTIAZEM HCL 60 MG PO TABS
90.0000 mg | ORAL_TABLET | Freq: Four times a day (QID) | ORAL | Status: DC
  Administered 2017-08-19 (×2): 90 mg via ORAL
  Filled 2017-08-19: qty 2

## 2017-08-19 NOTE — Progress Notes (Signed)
Subjective: [name]. [name] is feeling well this morning. Her mother is at bedside. Denies anymore gagging. Barium swallow was concerning for stricture so we have made her NPO in case GI wants to do an EGD. Her heart rate is back up to 120bpm. She denies chest pain or palpitations.  Objective:  Vital signs in last 24 hours: Vitals:   08/19/17 0845 08/19/17 1030 08/19/17 1031 08/19/17 1034  BP:  (!) 85/63 (!) 87/55 103/74  Pulse:      Resp:  15 18 15   Temp: (!) 97.4 F (36.3 C)     TempSrc: Oral     SpO2: 95%     Weight:      Height:       Gen: Well appearing, NAD CV: irregularly irregular, tachycardic 120bpm, no murmurs Pulm: Normal effort, CTA throughout, no wheezing Abd: Soft, NT, ND, normal BS.  Ext: Warm, no edema   Assessment/Plan:  Active Problems:   Aortic insufficiency   Adult hypothyroidism   Turner syndrome   PAF (paroxysmal atrial fibrillation) (HCC)   Chronic diastolic CHF (congestive heart failure) (HCC)   Gagging episode   Atrial fibrillation with RVR (HCC)   62 year old female with history of Turner syndrom (severe AR and ?VSD), HTN, hypothyroidism and paroxysmal A. Fib (on metoprolol and Eliquis at home), presented to ED for shortness of breath and atrial fibrilation   #Atrial fibrillation with RVR: Remains in a. Fib today with rates around 120. Presented with a.fib RVR at 180bpm. Patient has history of paroxysmal A. fib on metoprolol and Eliquis. Also h/o Turner's syndrome with severe aortic regurgitation, EF 50%. Rate controlled with Cardizem drip at ED. Leucocytosis 14.6, but doubt infection due to afebrile, CXR negative for pneumonia, lungs CTAB, urinalysis clean, no abdominal pain. TSH low and free T4 high this admission, could be cause for a.fib with RVR - EKG: repeat yesterday afternoon with atrial flutter 73bpm - TSH: 0.26, free T4 2.60 - Transitioned to po cardizem 8/13: held 8/14 due to bradycardia, heart rate elevated overnight, will restart  cardizem today -Continue Eliquis 5 mg twice daily - Holding home metoprolol 25mg  BID: HR 77 with po cardizem. If HR is > 100, will consider adding back metoprolol.   #Hypothyroidism: TSH: 0.26, free T4 2.60. Appears she is over treated with home synthroid dose - discontinue synthroid - f/u outpatient to recheck TSH and free T4 and decide what dose of Synthroid to put her on  #Gagging, GERD: Reports gagging resolved yesterday afternoon but she presented with 4 days of gagging. Endorses poor appetite, globus sensation and gagging after meals. Gagging could be caused from a.fib with RVR --> increased oxygen demand --> shortness of breath. Does have history of hiatal hernia and tortuous esophagus seen on barium swallow in 2016 but would expect an anatomical issue to cause more consistent symptoms. Evaluated by GI in 2016, EGD for esophageal dilation was considered but not performed because [name]'s symptoms were not that severe. - Speech pathology evaluation: rec'd regular barium swallow. Will hold off until GI evaluates - GI consulted: [facility]. - Barium swallow with esophogeal dysmotility/dilation and probable stricture.  - NPO for potential EGD, awaiting GI recs  #CHF: Euvolemic. TEE 06/2017 (EF 50-55%, severe AR, mild MR), heart cath 07/2017 with nonobstructive coronary disease. ?VSD...small VSD seen on echo but heart cath oximetry did not detect left to right shunt.  - s/p one dose of Lasix 40 mg IV with 1.1L output - Hold Metoprolol and spironolactone due  to soft blood pressure - Hold home lasix 20mg   #HTN: mother states SBP is usually in the low 100s. Continues to have soft pressures (103/74). Is NPO today for possible EGD. Denies dizziness or light headedness.  - Continue to monitor, if BP decreases or patient becomes symptomatic, will consider giving small fluid bolus - holding home metoprolol and spironolactone as above  #LLQ pain, abdominal tenderness, nausea x few days+  leucocytosis.  - resolved by the time of my evaluation   #Hyponatremia: Na:128. likely 2/2 CHF (Grade 1 diastolic dysfunction). S/P 500 ml NaCl. Etiology is  -Improving. 128  today   #Hypokalemia: -PO Kdur  -Repat BMP    Dispo: Anticipated discharge in approximately 1 day(s).   Ali LoweVogel, Alaiah Lundy S, MD 08/19/2017, 10:39 AM Pager: [phone]

## 2017-08-19 NOTE — Evaluation (Signed)
Occupational Therapy Evaluation Patient Details Name: [name] MRN: [id number] DOB: 1955-11-17 Today's Date: 08/19/2017    History of Present Illness Pt is a 62 y.o. female admitted 08/16/17, referred by PCP for rapid a-fib, also with c/o intermittent gagging spells. Worked up for relapse of paroxysmal a-fib. Potential for endoscopy 8/15. PMH includes Turner syndrome, severe aortic insufficiency, hypothyroidism, HTN.   Clinical Impression   PTA patient reports independent with ADLs, mobility (using RW in community) and light IADLs.  She currently requires supervision for transfers, UB/LB ADLs and toileting, requires increased time and effort for all ADL tasks and fatigues easily.  Patient educated on precautions, safety, and energy conservation techniques.  Based on performance today, patient will benefit from continued OT services while admitted in order to maximize safety and independence with ADLs, continue education on energy conservation, and increase tolerance to activities.  Will continue to follow while admitted, but no follow OT recommended.     Follow Up Recommendations  Supervision/Assistance - 24 hour;No OT follow up    Equipment Recommendations  None recommended by OT    Recommendations for Other Services       Precautions / Restrictions Precautions Precautions: Fall Restrictions Weight Bearing Restrictions: No      Mobility Bed Mobility Overal bed mobility: Modified Independent Bed Mobility: Supine to Sit;Sit to Supine     Supine to sit: Modified independent (Device/Increase time) Sit to supine: Modified independent (Device/Increase time)   General bed mobility comments: Increased time and effort  Transfers Overall transfer level: Needs assistance Equipment used: Rolling walker (2 wheeled) Transfers: Sit to/from Stand Sit to Stand: Supervision         General transfer comment: supervision for toilet transfers, cueing for use of RW for safety      Balance Overall balance assessment: Needs assistance Sitting-balance support: No upper extremity supported;Feet supported Sitting balance-Leahy Scale: Good     Standing balance support: During functional activity;No upper extremity supported Standing balance-Leahy Scale: Fair Standing balance comment: no UE support required during ADLs within base of support                           ADL either performed or assessed with clinical judgement   ADL Overall ADL's : Needs assistance/impaired     Grooming: Supervision/safety;Standing   Upper Body Bathing: Set up;Sitting   Lower Body Bathing: Set up;Sit to/from stand   Upper Body Dressing : Set up;Sitting   Lower Body Dressing: Set up;Supervision/safety;Sit to/from stand   Toilet Transfer: Supervision/safety;Ambulation;RW   Toileting- Clothing Manipulation and Hygiene: Supervision/safety;Sit to/from stand;Cueing for safety;Cueing for sequencing       Functional mobility during ADLs: Supervision/safety;Rolling walker General ADL Comments: Uses RW for all mobility, cueing for task completion with ADLs (hygiene for toileting, and washing hands after restroom), fatigues easily.      Vision Baseline Vision/History: Wears glasses Wears Glasses: At all times Patient Visual Report: No change from baseline Vision Assessment?: No apparent visual deficits     Perception     Praxis      Pertinent Vitals/Pain Pain Assessment: No/denies pain     Hand Dominance     Extremity/Trunk Assessment Upper Extremity Assessment Upper Extremity Assessment: Generalized weakness   Lower Extremity Assessment Lower Extremity Assessment: Defer to PT evaluation   Cervical / Trunk Assessment Cervical / Trunk Assessment: Kyphotic   Communication Communication Communication: No difficulties   Cognition Arousal/Alertness: Awake/alert Behavior During Therapy: WFL for tasks assessed/performed  Overall Cognitive Status: History of  cognitive impairments - at baseline                                 General Comments: approriately interacting, slow to respond, but appears baseline as mother is present    General Comments  mother present during session     Exercises     Shoulder Instructions      Home Living Family/patient expects to be discharged to:: Private residence Living Arrangements: Parent Available Help at Discharge: Family;Available 24 hours/day Type of Home: Apartment Home Access: Stairs to enter Entrance Stairs-Number of Steps: 1 Entrance Stairs-Rails: None Home Layout: One level     Bathroom Shower/Tub: Chief Strategy OfficerTub/shower unit   Bathroom Toilet: Standard Bathroom Accessibility: Yes How Accessible: Accessible via walker Home Equipment: Walker - 2 wheels;Tub bench;Bedside commode   Additional Comments: Mother available for 24/7 support      Prior Functioning/Environment Level of Independence: Independent with assistive device(s)        Comments: indepedent with ADLs and mobility, reports using RW in commuinty mainly; requires assist for exiting tub; light IADLs         OT Problem List: Decreased strength;Decreased activity tolerance      OT Treatment/Interventions: Self-care/ADL training;Energy conservation;Patient/family education;Therapeutic activities    OT Goals(Current goals can be found in the care plan section) Acute Rehab OT Goals Patient Stated Goal: Return home OT Goal Formulation: With patient Time For Goal Achievement: 09/02/17 Potential to Achieve Goals: Good  OT Frequency: Min 2X/week   Barriers to D/C:            Co-evaluation              AM-PAC PT "6 Clicks" Daily Activity     Outcome Measure Help from another person eating meals?: None Help from another person taking care of personal grooming?: A Little Help from another person toileting, which includes using toliet, bedpan, or urinal?: A Little Help from another person bathing (including  washing, rinsing, drying)?: A Little Help from another person to put on and taking off regular upper body clothing?: None Help from another person to put on and taking off regular lower body clothing?: A Little 6 Click Score: 20   End of Session Equipment Utilized During Treatment: Gait belt;Rolling walker Nurse Communication: Mobility status  Activity Tolerance: Patient limited by fatigue Patient left: in bed;with call bell/phone within reach;with bed alarm set;with family/visitor present  OT Visit Diagnosis: Muscle weakness (generalized) (M62.81);Other (comment)(activity tolerance decreased )                Time: [id number] OT Time Calculation (min): 24 min Charges:  OT General Charges $OT Visit: 1 Visit OT Evaluation $OT Eval Low Complexity: 1 Low  Chancy Milroyhristie S Penina Reisner, OTR/L  Pager [phone]   Chancy MilroyChristie S Aloha Bartok 08/19/2017, 4:12 PM

## 2017-08-19 NOTE — Progress Notes (Signed)
The patient was seen in follow-up today. She has had a problem with chronic gagging. 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 She has been following with Dr. Ewing SchleinMagod and he is aware of this. A repeat barium swallow shows esophageal dysmotility, a tortuous esophagus, and a benign distal esophageal stenosis. We talked about this. This is a chronic problem for the patient. She has never had an esophageal dilatation. I'm not sure if an esophageal dilatation will help her gagging or not. I talked to her and her family about this. They have discussed this before with Dr. Ewing SchleinMagod. She is on Eliquis and would have to come off of this for a few days should she have dilatation done. Since there is no urgency and since this is a chronic problem I think that from a GI standpoint she can follow-up with Dr. Ewing SchleinMagod and discuss whether or not she wanted esophageal dilatation attempted to see if this helped her. She will have to be off her anticoagulation for a few days. We will sign off. We will not plan intervention this hospitalization. She can discuss this with Dr Ewing SchleinMagod.

## 2017-08-19 NOTE — Plan of Care (Signed)

## 2017-08-19 NOTE — Evaluation (Signed)
Physical Therapy Evaluation Patient Details Name: [name] MRN: [id number] DOB: 21-Dec-1955 Today's Date: 08/19/2017   History of Present Illness  Pt is a 62 y.o. female admitted 08/16/17, referred by PCP for rapid a-fib, also with c/o intermittent gagging spells. Worked up for relapse of paroxysmal a-fib. Potential for endoscopy 8/15. PMH includes Turner syndrome, severe aortic insufficiency, hypothyroidism, HTN.    Clinical Impression  Pt presents with an overall decrease in functional mobility secondary to above. PTA, pt indep with intermittent use of RW; lives with mother who provides 24/7 support. Today, pt ambulating well with RW at supervision-level; mod indep with ADLs. HR up to 138 while supine and talking, 90-138 with mobility. SpO2 >90% on RA. Pt would benefit from continued acute PT services to maximize functional mobility and independence prior to d/c home. Encouraged to continue ambulating with supervision from nursing staff during admission (RN/NT notified)  Supine BP 106/63 Seated BP 100/78    Follow Up Recommendations No PT follow up;Supervision for mobility/OOB    Equipment Recommendations  None recommended by PT    Recommendations for Other Services       Precautions / Restrictions Precautions Precautions: Fall Restrictions Weight Bearing Restrictions: No      Mobility  Bed Mobility Overal bed mobility: Needs Assistance Bed Mobility: Supine to Sit;Sit to Supine     Supine to sit: Modified independent (Device/Increase time) Sit to supine: Modified independent (Device/Increase time)   General bed mobility comments: Increased time and effort  Transfers Overall transfer level: Needs assistance Equipment used: Rolling walker (2 wheeled) Transfers: Sit to/from Stand Sit to Stand: Supervision;Modified independent (Device/Increase time)         General transfer comment: Stood from bed 3x and toilet 1x with RW; initial supervision progressing to mod  indep  Ambulation/Gait Ambulation/Gait assistance: Supervision Gait Distance (Feet): 120 Feet Assistive device: Rolling walker (2 wheeled) Gait Pattern/deviations: Step-through pattern;Decreased stride length;Trunk flexed Gait velocity: Decreased Gait velocity interpretation: 1.31 - 2.62 ft/sec, indicative of limited community ambulator General Gait Details: Slow, steady amb with RW and supervision for safety. DOE 2/4 with SpO2 >90% on RA. HR up to 136  Stairs            Wheelchair Mobility    Modified Rankin (Stroke Patients Only)       Balance Overall balance assessment: Needs assistance   Sitting balance-Leahy Scale: Good       Standing balance-Leahy Scale: Fair Standing balance comment: Can static stand to perform ADLs with no UE support                             Pertinent Vitals/Pain Pain Assessment: No/denies pain    Home Living Family/patient expects to be discharged to:: Private residence Living Arrangements: Parent Available Help at Discharge: Family;Available 24 hours/day Type of Home: Apartment Home Access: Stairs to enter Entrance Stairs-Rails: None Entrance Stairs-Number of Steps: 1 Home Layout: One level Home Equipment: Walker - 2 wheels;Tub bench;Bedside commode Additional Comments: Mother available for 24/7 support    Prior Function Level of Independence: Independent with assistive device(s)         Comments: Mod indep with intermittent use of RW; does not use often since not needed     Hand Dominance        Extremity/Trunk Assessment   Upper Extremity Assessment Upper Extremity Assessment: Generalized weakness    Lower Extremity Assessment Lower Extremity Assessment: Generalized weakness    Cervical /  Trunk Assessment Cervical / Trunk Assessment: Kyphotic  Communication      Cognition Arousal/Alertness: Awake/alert Behavior During Therapy: WFL for tasks assessed/performed Overall Cognitive Status: History  of cognitive impairments - at baseline                                 General Comments: Appropriately interacting and following commands. Occasionally slow to respond/answer questions/get words out, but seems this is baseline      General Comments General comments (skin integrity, edema, etc.): Mother present during session    Exercises     Assessment/Plan    PT Assessment Patient needs continued PT services  PT Problem List Decreased strength;Decreased activity tolerance;Decreased balance;Decreased mobility;Cardiopulmonary status limiting activity       PT Treatment Interventions DME instruction;Gait training;Stair training;Functional mobility training;Therapeutic activities;Therapeutic exercise;Balance training;Patient/family education    PT Goals (Current goals can be found in the Care Plan section)  Acute Rehab PT Goals Patient Stated Goal: Return home PT Goal Formulation: With patient/family Time For Goal Achievement: 09/02/17 Potential to Achieve Goals: Good    Frequency Min 3X/week   Barriers to discharge        Co-evaluation               AM-PAC PT "6 Clicks" Daily Activity  Outcome Measure Difficulty turning over in bed (including adjusting bedclothes, sheets and blankets)?: None Difficulty moving from lying on back to sitting on the side of the bed? : None Difficulty sitting down on and standing up from a chair with arms (e.g., wheelchair, bedside commode, etc,.)?: None Help needed moving to and from a bed to chair (including a wheelchair)?: None Help needed walking in hospital room?: A Little Help needed climbing 3-5 steps with a railing? : A Little 6 Click Score: 22    End of Session Equipment Utilized During Treatment: Gait belt Activity Tolerance: Patient tolerated treatment well Patient left: in bed;with call bell/phone within reach;with bed alarm set;with family/visitor present Nurse Communication: Mobility status PT Visit  Diagnosis: Other abnormalities of gait and mobility (R26.89);Muscle weakness (generalized) (M62.81)    Time: [id number] PT Time Calculation (min) (ACUTE ONLY): 32 min   Charges:   PT Evaluation $PT Eval Moderate Complexity: 1 Mod PT Treatments $Gait Training: 8-22 mins       Ina HomesJaclyn Kirby Argueta, PT, DPT Acute Rehab Services  Pager: [phone]  Malachy ChamberJaclyn L Kalista Laguardia 08/19/2017, 9:01 AM

## 2017-08-19 NOTE — Progress Notes (Signed)
SLP Cancellation Note  Patient Details Name: [name] MRN: [id number] DOB: 18-Dec-1955   Cancelled treatment:       Reason Eval/Treat Not Completed: Medical issues which prohibited therapy(Pt NPO d/t possible EGD)   Skylen Spiering 08/19/2017, 12:56 PM

## 2017-08-20 DIAGNOSIS — Z91040 Latex allergy status: Secondary | ICD-10-CM

## 2017-08-20 LAB — BASIC METABOLIC PANEL
ANION GAP: 5 (ref 5–15)
Anion gap: 8 (ref 5–15)
BUN: 20 mg/dL (ref 8–23)
BUN: 17 mg/dL (ref 8–23)
CHLORIDE: 96 mmol/L — AB (ref 98–111)
CHLORIDE: 97 mmol/L — AB (ref 98–111)
CO2: 28 mmol/L (ref 22–32)
CO2: 29 mmol/L (ref 22–32)
CREATININE: 0.73 mg/dL (ref 0.44–1.00)
CREATININE: 0.65 mg/dL (ref 0.44–1.00)
Calcium: 8.7 mg/dL — ABNORMAL LOW (ref 8.9–10.3)
Calcium: 8.4 mg/dL — ABNORMAL LOW (ref 8.9–10.3)
GFR calc Af Amer: >60 mL/min (ref >60)
GFR calc non Af Amer: >60 mL/min (ref >60)
GFR calc non Af Amer: >60 mL/min (ref >60)
Glucose, Bld: 107 mg/dL — ABNORMAL HIGH (ref 70–99)
Glucose, Bld: 114 mg/dL — ABNORMAL HIGH (ref 70–99)
POTASSIUM: 4.5 mmol/L (ref 3.5–5.1)
Potassium: 4.5 mmol/L (ref 3.5–5.1)
SODIUM: 132 mmol/L — AB (ref 135–145)
SODIUM: 131 mmol/L — AB (ref 135–145)

## 2017-08-20 MED ORDER — FUROSEMIDE 20 MG PO TABS: ORAL_TABLET | ORAL | 11 refills | Status: DC

## 2017-08-20 MED ORDER — LEVOTHYROXINE SODIUM 75 MCG PO TABS: 75.0000 ug | ORAL_TABLET | Freq: Every day | ORAL | 2 refills | Status: AC

## 2017-08-20 MED ORDER — LEVOTHYROXINE SODIUM 75 MCG PO TABS: 75.0000 ug | ORAL_TABLET | Freq: Every day | ORAL | Status: DC

## 2017-08-20 MED ORDER — SODIUM CHLORIDE 0.9 % IV BOLUS
500.0000 mL | Freq: Once | INTRAVENOUS | Status: AC
  Administered 2017-08-20: 500 mL via INTRAVENOUS

## 2017-08-20 NOTE — Progress Notes (Signed)
   Subjective: NAEON. [name] is feeling well this morning. Her mother is at bedside. Heart rate better controlled. Discussed medication changes. Would like to go home today.   Objective:  Vital signs in last 24 hours: Vitals:   08/19/17 2358 08/20/17 0019 08/20/17 0444 08/20/17 0500  BP: (!) 100/50  96/75   Pulse: 64 83 62   Resp: 16 15 20    Temp: (!) 97.5 F (36.4 C)  97.6 F (36.4 C)   TempSrc: Oral     SpO2: 95% 99% 95%   Weight:    57.2 kg  Height:       Gen: Well appearing, NAD CV: irregularly irregular Pulm: Normal effort, CTA throughout Abd: Soft, NT, ND Ext: Warm, no edema   Assessment/Plan:  Active Problems:   Aortic insufficiency   Adult hypothyroidism   Turner syndrome   PAF (paroxysmal atrial fibrillation) (HCC)   Chronic diastolic CHF (congestive heart failure) (HCC)   Gagging episode   Atrial fibrillation with RVR (HCC)   62 year old female with history of Turner syndrom (severe AR and ?VSD), HTN, hypothyroidism and paroxysmal A. Fib (on metoprolol and Eliquis at home), presented to ED for shortness of breath and atrial fibrilation with RVR   #Atrial fibrillation with RVR: Remains in a. Fib but is now rate controlled with just home metoprolol. No longer requiring cardizem.  - Continue Eliquis 5 mg twice daily - Continue home metoprolol 25mg  BID  #Hypothyroidism: TSH: 0.26, free T4 2.60. Appears she is over treated with home synthroid dose - decrease home synthroid dose from 88mcg to 75mcg  - f/u outpatient to recheck TSH and free T4 and decide what dose of Synthroid to put her on  #Hyponatremia: Na:128. Serum osm 269, urine osm 491, urine sodium <10, serum sodium 126. Na dropped to 122 after IV Lasix. Likely due to decreased solute intake Dean/P 500 ml NaCl   - Stable at 128  - 500cc bolus today, recheck BMP this afternoon  #Gagging, GERD: Gagging has self-resolved. GI opted not to do EGD.  - regular diet   #CHF: Remains euvolemic.  K+ 4.8 -  Started back home Metoprolol  - Holding home spironolactone and lasix due to soft blood pressure  #HTN:  Soft blood pressures (96/75), but not too far out of her normal trend.  - holding home spironolactone as above   Dispo: Anticipated discharge in approximately 0 day(Dean).   [name], Donna Lucarelli S, MD 08/20/2017, 7:19 AM Pager: [phone]

## 2017-08-30 ENCOUNTER — Telehealth: Payer: Self-pay | Admitting: Cardiovascular Disease

## 2017-08-30 MED ORDER — FUROSEMIDE 20 MG PO TABS: 20.0000 mg | ORAL_TABLET | ORAL | 3 refills | Status: DC

## 2017-08-30 MED ORDER — POTASSIUM CHLORIDE ER 10 MEQ PO TBCR: 10.0000 meq | EXTENDED_RELEASE_TABLET | ORAL | 3 refills | Status: DC

## 2017-08-30 NOTE — Telephone Encounter (Signed)
Returned call to patient's mother who states [facility] and told to d/c Lasix and Spironolactone due to hypotension. She was admitted with A fib with RVR and [facility]ology consult. Patient's mother is concerned that patient needs some Lasix on a regular basis because she has SOB. I advised her that Dr. Elease HashimotoNahser will review the patient's chart and we will call back with advice. She verbalized understanding and agreement and thanked me for the call.

## 2017-08-30 NOTE — Telephone Encounter (Signed)
She was volume depleted when she was admitted to the emergency room.  The history and physical also states that she had lots of gagging.  I suspect that her volume depletion was a combination of the diuretics plus poor p.o. intake.  Please have her mother continue to weigh her daily.  Anticipate that will need to restart the Lasix and/or the Aldactone back at some point.  If we watch her closely with daily weights, I think that will be okay to see how she does for the next several days.  Please ask if her nausea and vomiting and p.o. intake has improved.

## 2017-08-30 NOTE — Telephone Encounter (Signed)
New Message:    Pt mother is requesting a call back

## 2017-08-30 NOTE — Telephone Encounter (Signed)
Called patient's mother and reviewed Dr. Harvie BridgeNahser's advice with her. She states she gave patient Lasix 20 mg today. She states patient has taken Kdur everyday, states she was not told that patient was supposed to d/c. She states she thinks the patient needs to take Lasix occasionally for SOB. I spoke with Dr. Elease HashimotoNahser who is in the office and he advised that patient may take Lasix 20 mg and Kdur 10 mEq 3 times per week. I advised mother on daily weights and to monitor SOB and swelling. I advised her to call back with questions or concerns. She thanked me for the call.

## 2017-08-31 ENCOUNTER — Telehealth: Payer: Self-pay | Admitting: Cardiovascular Disease

## 2017-08-31 ENCOUNTER — Telehealth: Payer: Self-pay | Admitting: Pulmonary Disease

## 2017-08-31 DIAGNOSIS — Z79899 Other long term (current) drug therapy: Secondary | ICD-10-CM

## 2017-08-31 DIAGNOSIS — E8770 Fluid overload, unspecified: Secondary | ICD-10-CM

## 2017-08-31 MED ORDER — SPIRONOLACTONE 25 MG PO TABS: ORAL_TABLET | ORAL | 3 refills | Status: AC

## 2017-08-31 MED ORDER — FUROSEMIDE 40 MG PO TABS: 40.0000 mg | ORAL_TABLET | Freq: Every day | ORAL | 3 refills | Status: DC

## 2017-08-31 MED ORDER — POTASSIUM CHLORIDE ER 10 MEQ PO TBCR: 10.0000 meq | EXTENDED_RELEASE_TABLET | Freq: Every day | ORAL | 3 refills | Status: DC

## 2017-08-31 NOTE — Telephone Encounter (Signed)
ATC line rang busy will try to call back.

## 2017-08-31 NOTE — Telephone Encounter (Signed)
New Message       Pt c/o swelling: STAT is pt has developed SOB within 24 hours  1) How much weight have you gained and in what time span? Not sure  2) If swelling, where is the swelling located? Legs/feet  3) Are you currently taking a fluid pill? Yes  4) Are you currently SOB? Yes  5) Do you have a log of your daily weights (if so, list)? No  6) Have you gained 3 pounds in a day or 5 pounds in a week? 5 pounds  7) Have you traveled recently? No

## 2017-08-31 NOTE — Telephone Encounter (Signed)
Increase lasix to 40 mg a day  And Kdur to 10 meq a day  Restart Spironolactone at 12.5  mg a day BMP in 1 week.  Continue to weigh daily .  Let us know how she is doing

## 2017-08-31 NOTE — Telephone Encounter (Signed)
Called and spoke with patients mother, she stated that she is on some cardiac medications that she is not happy with. She would like to know if these can been changed. Advised her that since they are prescribed by her cardiologist Dr. Elease HashimotoNahser she would need to contact that office. Verbalized understanding. Nothing further needed.

## 2017-08-31 NOTE — Telephone Encounter (Signed)
Spoke to patient's mother (DPR) who is concerned because the patient has developed swollen legs/feet and is slightly SOB.  Her Furosemide has recently been changed to 3 times per week.    She has gained 6 pounds since 8/16.  She said that she has gotten worse since the medication change.  Please advise, thank you.

## 2017-08-31 NOTE — Telephone Encounter (Signed)
Spoke to patient's mother and gave her Dr Harvie BridgeNahser's recommendations for the patient.  She verbalized understanding.

## 2017-09-01 ENCOUNTER — Telehealth: Payer: Self-pay | Admitting: Cardiovascular Disease

## 2017-09-01 NOTE — Telephone Encounter (Signed)
We increased  Lasix and restarted  aldactone yesterday  Please verify that she is now taking these

## 2017-09-01 NOTE — Telephone Encounter (Signed)
Spoke to patient's mother and I told her to monitor the patient's progress with her weight since we just made the medication adjustment on 8/27.  She verbalized understanding.

## 2017-09-01 NOTE — Telephone Encounter (Signed)
New Message       Patient has gained 2 pounds as of yesterday, she was told to contact the doctor if she gained 2 to 3 pounds.

## 2017-09-03 ENCOUNTER — Telehealth: Payer: Self-pay | Admitting: Cardiovascular Disease

## 2017-09-03 NOTE — Telephone Encounter (Signed)
Agree with note by Michelle Swinyer, RN  

## 2017-09-03 NOTE — Telephone Encounter (Signed)
New message:      Pt's mother is calling and requesting to speak with Casimiro NeedleMichael

## 2017-09-03 NOTE — Telephone Encounter (Signed)
Patient's mother called as requested by Casimiro NeedleMichael, RN on 8/27 to report how patient is doing since the medication changes. She reports weight today is 136 lb, same x 3 days. She states patient has some SOB and bilateral lower extremity swelling. She states she gives patient her oxygen to wear sometimes, which helps. I advised her that a Rx is needed for oxygen and she states patient has an appointment with a pulmonologist next week. I advised her to discuss need for O2 with that provider.  Mother reports that patient has good urine output and is eating and drinking well. Patient was 128 lb when she came out of the hospital but some weight gain may be related to increased appetite. I advised her that some SOB is expected with patient's severe AS and that some of her leg swelling may be due to her sitting with legs dependent much of the day. I advised her to continue current medications and call back to report weight gain of 3 lbs in one day or 5 lbs in one week. I scheduled follow-up with Dr. Elease HashimotoNahser on 9/24 and advised patient's mother to call back for sooner appointment or for advice if needed prior to appointment. She verbalized understanding and agreement and thanked me for the call.

## 2017-09-08 ENCOUNTER — Telehealth: Payer: Self-pay | Admitting: Cardiovascular Disease

## 2017-09-08 ENCOUNTER — Other Ambulatory Visit: Payer: Medicaid Other

## 2017-09-08 MED ORDER — POTASSIUM CHLORIDE CRYS ER 10 MEQ PO TBCR: 10.0000 meq | EXTENDED_RELEASE_TABLET | Freq: Two times a day (BID) | ORAL | 3 refills | Status: AC

## 2017-09-08 MED ORDER — FUROSEMIDE 40 MG PO TABS: 40.0000 mg | ORAL_TABLET | Freq: Two times a day (BID) | ORAL | 3 refills | Status: AC

## 2017-09-08 NOTE — Telephone Encounter (Signed)
Called patient's mother to review Dr. Harvie Bridge advice with her. She states she will give patient additional Kdur 10 mEq and Lasix 40 mg now and again tomorrow. I advised her to call back Friday morning to report and that if needed, Dr. Elease Hashimoto will see her on Friday. She reports that both she and the patient eat a low sodium diet, states she is careful to read every label. I advised her to call back sooner if needed and she thanked me for the call.

## 2017-09-08 NOTE — Telephone Encounter (Signed)
Lets increase the Lasix to 40 mg BID and Kdur 10 BID for and report back on Friday

## 2017-09-08 NOTE — Telephone Encounter (Signed)
Agree with note by Eligha Bridegroom, RN. Please make sure she is not getting excessive salt.

## 2017-09-08 NOTE — Telephone Encounter (Signed)
Called patient's mother who reports patient's weight has increased over the past few days and she is more SOB. Reports weight today is 139 lb a 3 lb increase from 8/30. Reports bilateral LE edema, right leg worse than left. Mother reports good urine output but not as much as when initially restarted Lasix and Spironolactone after discharge from hospital. Patient's Lasix and Spironolactone were stopped at d/c due to hypotension. She does not monitor patient's BP at home but denies that patient has dizziness or light-headedness. Patient's mother reports that patient is SOB just walking to the bathroom and she is concerned. Patient's appointment with Dr. Elease Hashimoto is 9/24. I advised that I will discuss sooner appointment or medication changes with Dr. Elease Hashimoto and call back with his advice. She verbalized understanding and agreement and thanked me for the call.

## 2017-09-08 NOTE — Telephone Encounter (Signed)
New Message    Pt c/o Shortness Of Breath: STAT if SOB developed within the last 24 hours or pt is noticeably SOB on the phone  1. Are you currently SOB (can you hear that pt is SOB on the phone)? Mother on the phone  2. How long have you been experiencing SOB? Since she left the hospital  3. Are you SOB when sitting or when up moving around?   4. Are you currently experiencing any other symptoms? Anxious and mother has questions. Please call

## 2017-09-10 ENCOUNTER — Encounter: Payer: Self-pay | Admitting: Adult Health

## 2017-09-10 ENCOUNTER — Other Ambulatory Visit (INDEPENDENT_AMBULATORY_CARE_PROVIDER_SITE_OTHER): Payer: Medicaid Other

## 2017-09-10 ENCOUNTER — Other Ambulatory Visit: Payer: Medicaid Other

## 2017-09-10 ENCOUNTER — Ambulatory Visit (INDEPENDENT_AMBULATORY_CARE_PROVIDER_SITE_OTHER): Payer: Medicaid Other | Admitting: Adult Health

## 2017-09-10 ENCOUNTER — Ambulatory Visit (INDEPENDENT_AMBULATORY_CARE_PROVIDER_SITE_OTHER)
Admission: RE | Admit: 2017-09-10 | Discharge: 2017-09-10 | Disposition: A | Discharge status: Home / Self Care | Payer: Medicaid Other | Source: Ambulatory Visit | Attending: Adult Health | Admitting: Adult Health

## 2017-09-10 VITALS — BP 128/62 | HR 76 | Wt 139.6 lb

## 2017-09-10 DIAGNOSIS — G4733 Obstructive sleep apnea (adult) (pediatric): Secondary | ICD-10-CM | POA: Diagnosis not present

## 2017-09-10 DIAGNOSIS — R0602 Shortness of breath: Secondary | ICD-10-CM | POA: Diagnosis not present

## 2017-09-10 DIAGNOSIS — I5032 Chronic diastolic (congestive) heart failure: Secondary | ICD-10-CM | POA: Diagnosis not present

## 2017-09-10 LAB — BASIC METABOLIC PANEL
BUN: 32 mg/dL — ABNORMAL HIGH (ref 6–23)
CO2: 30 meq/L (ref 19–32)
Calcium: 8.9 mg/dL (ref 8.4–10.5)
Chloride: 95 mEq/L — ABNORMAL LOW (ref 96–112)
Creatinine, Ser: 1.08 mg/dL (ref 0.40–1.20)
GFR: 54.5 mL/min — ABNORMAL LOW (ref >60.00)
GLUCOSE: 124 mg/dL — AB (ref 70–99)
POTASSIUM: 3.7 meq/L (ref 3.5–5.1)
SODIUM: 133 meq/L — AB (ref 135–145)

## 2017-09-10 LAB — BRAIN NATRIURETIC PEPTIDE: Pro B Natriuretic peptide (BNP): 3174 pg/mL — ABNORMAL HIGH (ref 0.0–100.0)

## 2017-09-10 NOTE — Progress Notes (Signed)
Called spoke with patient's mother [name] (dpr on file), advised of lab results / recs as stated by TP.  [name] verbalized understanding, repeated Lasix 40mg  instructions back to me and denied any questions.

## 2017-09-10 NOTE — Assessment & Plan Note (Signed)
Appears to have mild volume overload, does not appear in any distress, O2 sats good.  Wt has increased . Would continue on lasix and aldactone, recently increased per cards.  Check bmet and bnp  Check cxr today   Plan  Patient Instructions  Continue on BIPAP At bedtime  .  Try to wear BIPAP every night all night . -try to wear for more than 6 hours . Wear with naps  Work on healthy weight .  Continue on Lasix and spironolactone .  Chest xray today  Labs today .  Follow up with cardiology on 09/13/17 .  Ffollow up Dr. Vassie Loll  In 6 months and As needed   Please contact office for sooner follow up if symptoms do not improve or worsen or seek emergency care

## 2017-09-10 NOTE — Patient Instructions (Addendum)
Continue on BIPAP At bedtime  .  Try to wear BIPAP every night all night . -try to wear for more than 6 hours . Wear with naps  Work on healthy weight .  Continue on Lasix and spironolactone .  Chest xray today  Labs today .  Follow up with cardiology on 09/13/17 .  Ffollow up Dr. Vassie Loll  In 6 months and As needed   Please contact office for sooner follow up if symptoms do not improve or worsen or seek emergency care

## 2017-09-10 NOTE — Progress Notes (Signed)
@Patient  ID: [name], female    DOB: 1955-06-15, 62 y.o.   MRN: [id number]  Chief Complaint  Patient presents with  . Follow-up    OSA     Referring provider: Tracey Harries, MD  HPI: 62 year old female never smoker with Turner syndrome and severe obstructive sleep apnea Past medical history significant for congestive heart failure (EF 45%  TEST  NPSG 08/29/12: Severe, with an AHI of 79  Bipap titration 08/2014-optimal 25/20 with small fullface mask  -Autobipap download 12/2014 AHI 20/h, leak ++, avg 18/13, excellent usage  06/2016 bipap 21/17  CT chest 08/2014>>7 mm lingular nodule,Slight decrease in follow-up 10/2015     09/10/2017 Follow up ; OSA  Patient returns for a follow-up for sleep apnea.  Patient has underlying severe sleep apnea.  Is on BiPAP at bedtime.  Patient says she is doing okay on BiPAP.  Download shows good compliance at 87%.  Average usage is 3.75 hours.  She is on IPAP 21, EPAP 10, pressure support 4 cm H2O.  AHI 7.3. We discussed usage and compliance . Says she gets up a lot at night to use bathroom.  Mother is with her today .   Recent admission last month for A FIB . Says has been having increased leg swelling bilaterally . Has recently been increased on lasix 40mg  Twice daily and spironolactone 12.5mg  daily  , wt is up 10lbs since discharge. Gets winded with activity . No increased cough . No calf pain . On Eliquis .  No orthopnea.  Has follow up with cards on 09/13/17 .  She has severe Aortic regurg.     Allergies  Allergen Reactions  . Cephalexin Rash and Other (See Comments)    Prefers not to have ? Psoriasis skin condition  . Latex Rash    Immunization History  Administered Date(s) Administered  . Influenza,inj,Quad PF,6+ Mos 10/09/2016  . Influenza-Unspecified 01/06/2015  . Pneumococcal Polysaccharide-23 08/18/2017  . Tdap 08/17/2011    Past Medical History:  Diagnosis Date  . Adiposity 06/04/2011  . Adult  hypothyroidism 06/04/2011  . Aortic insufficiency    a. severe by echo, TEE 2019.  Marland Kitchen Benign hypertensive heart disease without heart failure 10/16/2010  . CAD (coronary artery disease)    a.  [facility] 07/2017 showed severe AI, nonobstructive CAD (50-60% prox-mid Cx, 40% prox-mid LAD, 30% prox RCA), normal PA pressures, no L-R shunt, LVEDP , EF 50%..  . Cervix abnormality 1993   MILD ATYPICAL ADENOMATOUS HYPERPLASIA OF CERVIX  . Chronic diastolic CHF (congestive heart failure) (HCC) 08/29/2014  . Chronic respiratory failure with hypercapnia (HCC) 07/06/2012   Followed in Pulmonary clinic/ [facility]  - 05/2012 > 2lpm started at [facility] with HCO3 33-35 on bmets c/w hypercarbia. - PSS 08/29/12 Severe obstructive sleep apnea/hypopnea syndrome, with an AHI of 79 > see OSA - Placed on 24h 02 3lpm at d/c 09/04/14 - stopped 12/2014     . Dermatitis 05-26-12   all over  since 9'13  . Difficult intubation    will plan on spinal anesthesia"small mouth/airway"  . Dyspnea   . Edema of both legs    a. right greater than left, due to Turner's syndrome.  . Gagging episode   . H/O total hip arthroplasty 06/04/2011  . Hyperglycemia 12/23/2011  . Hypertension   . Hypothyroidism   . LAD (lymphadenopathy), mediastinal 08/30/2014  . Obesity 09/16/2014  . Orbital floor (blow-out) closed fracture (HCC) 08/17/2011  . OSA (obstructive sleep apnea) 09/10/2012  NPSG 08/29/12:  Severe obstructive sleep apnea/hypopnea syndrome, with an AHI of 79  06/2016 Bipap 21/17  . Osteoarthritis 10/16/2010   Overview:  Dr. Charlann Boxer (hips and knees)   . PAF (paroxysmal atrial fibrillation) (HCC)   . Periorbital edema   . 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 Severe aortic insufficiency   . Speech disorder    occ. "stutter"  . Supraventricular tachycardia (HCC) 08/27/2014  . Turner syndrome 06/04/2011  . Turner's syndrome   . Ventricular septal defect   . VSD (ventricular septal defect)    a. ? by TEE 06/2017, not seen on cardiac CT and no  shunt by [facility].    Tobacco History: Social History   Tobacco Use  Smoking Status Never Smoker  Smokeless Tobacco Never Used   Counseling given: Not Answered   Outpatient Medications Prior to Visit  Medication Sig Dispense Refill  . acetaminophen (TYLENOL) 500 MG tablet Take 1,000 mg by mouth every 6 (six) hours as needed for moderate pain or headache.    Marland Kitchen atorvastatin (LIPITOR) 20 MG tablet Take 1 tablet (20 mg total) by mouth daily. 30 tablet 11  . Cholecalciferol (VITAMIN D3) 2000 UNITS capsule Take 2,000 Units by mouth daily.    Marland Kitchen ELIQUIS 5 MG TABS tablet TAKE 1 TABLET BY MOUTH TWICE DAILY 60 tablet 10  . famotidine (PEPCID) 40 MG tablet Take 40 mg by mouth 2 (two) times daily.    . furosemide (LASIX) 40 MG tablet Take 1 tablet (40 mg total) by mouth 2 (two) times daily. 180 tablet 3  . levothyroxine (SYNTHROID, LEVOTHROID) 75 MCG tablet Take 1 tablet (75 mcg total) by mouth daily before breakfast. 30 tablet 2  . metoprolol tartrate (LOPRESSOR) 25 MG tablet TAKE 1 TABLET BY MOUTH TWICE DAILY 180 tablet 3  . potassium chloride (K-DUR,KLOR-CON) 10 MEQ tablet Take 1 tablet (10 mEq total) by mouth 2 (two) times daily. 180 tablet 3  . spironolactone (ALDACTONE) 25 MG tablet Take one half tablet (12.5 mg) daily 90 tablet 3  . traMADol (ULTRAM) 50 MG tablet Take 50 mg by mouth every 6 (six) hours as needed for moderate pain.      No facility-administered medications prior to visit.      Review of Systems  Constitutional:   No  weight loss, night sweats,  Fevers, chills,  +fatigue, or  lassitude.  HEENT:   No headaches,  Difficulty swallowing,  Tooth/dental problems, or  Sore throat,                No sneezing, itching, ear ache, nasal congestion, post nasal drip,   CV:  No chest pain,  Orthopnea, PND, +swelling in lower extremities, anasarca, dizziness, palpitations, syncope.   GI  No heartburn, indigestion, abdominal pain, nausea, vomiting, diarrhea, change in bowel habits,  loss of appetite, bloody stools.   Resp:   No chest wall deformity  Skin: no rash or lesions.  GU: no dysuria, change in color of urine, no urgency or frequency.  No flank pain, no hematuria   MS:  No joint pain or swelling.  No decreased range of motion.  No back pain.    Physical Exam  BP 128/62 (BP Location: Left Arm, Cuff Size: Normal)   Pulse 76   Wt 139 lb 9.6 oz (63.3 kg)   SpO2 93%   BMI 31.30 kg/m   GEN: A/Ox3; pleasant , NAD, obese    HEENT:  [place]/AT,  EACs-clear, TMs-wnl, NOSE-clear, THROAT-clear, no lesions, no postnasal  drip or exudate noted. Class 3 MP airway   NECK:  Supple w/ fair ROM; no JVD; normal carotid impulses w/o bruits; no thyromegaly or nodules palpated; no lymphadenopathy.    RESP  Clear  P & A; w/o, wheezes/ rales/ or rhonchi. no accessory muscle use, no dullness to percussion  CARD:  RRR, no m/r/g, 2+ peripheral edema, pulses intact, no cyanosis or clubbing.  GI:   Soft & nt; nml bowel sounds; no organomegaly or masses detected.   Musco: Warm bil, no deformities or joint swelling noted.   Neuro: alert, no focal deficits noted.    Skin: Warm, no lesions or rashes    Lab Results:  CBC    Component Value Date/Time   WBC 16.9 (H) 08/17/2017 0410   RBC 4.21 08/17/2017 0410   HGB 14.2 08/17/2017 0410   HGB 14.1 07/15/2017 1049   HCT 41.0 08/17/2017 0410   HCT 40.4 07/15/2017 1049   PLT 294 08/17/2017 0410   PLT 362 07/15/2017 1049   MCV 97.4 08/17/2017 0410   MCV 97 07/15/2017 1049   MCH 33.7 08/17/2017 0410   MCHC 34.6 08/17/2017 0410   RDW 13.4 08/17/2017 0410   RDW 12.7 07/15/2017 1049   LYMPHSABS 1.4 08/16/2017 1832   LYMPHSABS 1.7 05/05/2017 1002   MONOABS 1.8 (H) 08/16/2017 1832   EOSABS 0.0 08/16/2017 1832   EOSABS 0.0 05/05/2017 1002   BASOSABS 0.0 08/16/2017 1832   BASOSABS 0.0 05/05/2017 1002    BMET    Component Value Date/Time   NA 131 (L) 08/20/2017 1230   NA 135 07/15/2017 1049   K 4.5 08/20/2017 1230   CL  97 (L) 08/20/2017 1230   CO2 29 08/20/2017 1230   GLUCOSE 114 (H) 08/20/2017 1230   BUN 17 08/20/2017 1230   BUN 21 07/15/2017 1049   CREATININE 0.65 08/20/2017 1230   CREATININE 0.85 09/02/2015 1649   CALCIUM 8.4 (L) 08/20/2017 1230   GFRNONAA >60 08/20/2017 1230   GFRAA >60 08/20/2017 1230    BNP    Component Value Date/Time   BNP 1,720.4 (H) 08/16/2017 1832    ProBNP No results found for: PROBNP  Imaging: Dg Esophagus  Result Date: 08/18/2017 CLINICAL DATA:  Gagging episode.  History of esophageal dilatation EXAM: ESOPHOGRAM/BARIUM SWALLOW TECHNIQUE: Single contrast examination was performed using  thin barium. FLUOROSCOPY TIME:  Fluoroscopy Time:  Minute 54 second Radiation Exposure Index (if provided by the fluoroscopic device): Number of Acquired Spot Images: 0 COMPARISON:  10/13/2014 FINDINGS: Limited study due to limited mobility. Study performed with the patient in the left posterior oblique position. Mild diffuse esophageal dilatation. Mild decrease in esophageal motility. No aspiration identified. Barium tablet was administered and did not pass into the stomach. Despite multiple sips of water and barium, the tablet not pass through the GE junction. This area is obscured by the gastric fundus on the esophagram images. This is the same area where the barium tablet would not pass on the prior study. This is presumably due to a benign stricture which has been dilated in the past IMPRESSION: Limited study Mild esophageal dysmotility and dilatation Incompletely evaluated stricture distal esophagus. Barium tablet did not pass into the stomach. Electronically Signed   By: Marlan Palau M.D.   On: 08/18/2017 15:11   Dg Chest Port 1 View  Result Date: 08/16/2017 CLINICAL DATA:  Shortness of breath and gagging symptoms EXAM: PORTABLE CHEST 1 VIEW COMPARISON:  07/09/17 FINDINGS: Cardiac shadow is enlarged but stable. No focal  infiltrate or sizable effusion is noted. No acute bony  abnormality is seen. IMPRESSION: Stable cardiomegaly.  No acute abnormality noted. Electronically Signed   By: Alcide Clever M.D.   On: 08/16/2017 19:01     Assessment & Plan:   OSA (obstructive sleep apnea) Good control on BIPAP . Advised to try to increase number of hours each night . Advised to use with naps.   Plan  Patient Instructions  Continue on BIPAP At bedtime  .  Try to wear BIPAP every night all night . -try to wear for more than 6 hours . Wear with naps  Work on healthy weight .  Continue on Lasix and spironolactone .  Chest xray today  Labs today .  Follow up with cardiology on 09/13/17 .  Ffollow up Dr. Vassie Loll  In 6 months and As needed   Please contact office for sooner follow up if symptoms do not improve or worsen or seek emergency care        Chronic diastolic CHF (congestive heart failure) (HCC) Appears to have mild volume overload, does not appear in any distress, O2 sats good.  Wt has increased . Would continue on lasix and aldactone, recently increased per cards.  Check bmet and bnp  Check cxr today   Plan  Patient Instructions  Continue on BIPAP At bedtime  .  Try to wear BIPAP every night all night . -try to wear for more than 6 hours . Wear with naps  Work on healthy weight .  Continue on Lasix and spironolactone .  Chest xray today  Labs today .  Follow up with cardiology on 09/13/17 .  Ffollow up Dr. Vassie Loll  In 6 months and As needed   Please contact office for sooner follow up if symptoms do not improve or worsen or seek emergency care           Rubye Oaks, NP 09/10/2017

## 2017-09-10 NOTE — Telephone Encounter (Signed)
Called patient's mother to see how patient is feeling since increasing Lasix to 40 mg BID. She reports patient's weight was down 1 lb yesterday; she has not weighed yet today. She reports SOB is somewhat better. I offered her an appointment with Dr. Elease Hashimoto at 10:40 today or Monday afternoon. Mother states that patient has an appointment at pulmonologist this afternoon and that she prefers to bring patient to see Dr. Elease Hashimoto on Monday. I advised her that patient does not need to come to lab appointment today, that we will get lab work on Monday. I advised her to call back today or to the on-call provider over the weekend for advice if needed. Patient's mother thanked me for the call.

## 2017-09-10 NOTE — Assessment & Plan Note (Signed)
Good control on BIPAP . Advised to try to increase number of hours each night . Advised to use with naps.   Plan  Patient Instructions  Continue on BIPAP At bedtime  .  Try to wear BIPAP every night all night . -try to wear for more than 6 hours . Wear with naps  Work on healthy weight .  Continue on Lasix and spironolactone .  Chest xray today  Labs today .  Follow up with cardiology on 09/13/17 .  Ffollow up Dr. Vassie Loll  In 6 months and As needed   Please contact office for sooner follow up if symptoms do not improve or worsen or seek emergency care

## 2017-09-13 ENCOUNTER — Ambulatory Visit (INDEPENDENT_AMBULATORY_CARE_PROVIDER_SITE_OTHER): Payer: Medicaid Other | Admitting: Cardiovascular Disease

## 2017-09-13 ENCOUNTER — Encounter: Payer: Self-pay | Admitting: Cardiovascular Disease

## 2017-09-13 ENCOUNTER — Other Ambulatory Visit: Payer: Self-pay | Admitting: Cardiovascular Disease

## 2017-09-13 VITALS — BP 90/70 | HR 59 | Ht <= 58 in | Wt 139.0 lb

## 2017-09-13 DIAGNOSIS — I350 Nonrheumatic aortic (valve) stenosis: Secondary | ICD-10-CM | POA: Diagnosis not present

## 2017-09-13 DIAGNOSIS — I480 Paroxysmal atrial fibrillation: Secondary | ICD-10-CM

## 2017-09-13 MED ORDER — AMIODARONE HCL 400 MG PO TABS: 400.0000 mg | ORAL_TABLET | Freq: Two times a day (BID) | ORAL | 3 refills | Status: DC

## 2017-09-13 NOTE — Patient Instructions (Addendum)
Medication Instructions:  Your physician has recommended you make the following change in your medication:   START Amiodarone 400 mg twice daily   Labwork: TODAY - CBC, BMET   Testing/Procedures: You are scheduled for a TEE/Cardioversion/TEE Cardioversion on Wednesday Sept. 11 with Dr. Duke Salvia.  Please arrive at the [facility] (Main Entrance A) at [facility]: [address] [place], [place] [address] at 12:45 pm. (1 hour prior to procedure unless lab work is needed; if lab work is needed arrive 1.5 hours ahead)  DIET: Nothing to eat or drink after midnight except a sip of water with medications (see medication instructions below)  Medication Instructions: Hold Furosemide (Lasix) that morning  Continue your anticoagulant: Eliquis - do not miss any doses You will need to continue your anticoagulant after your procedure until you  are told by your  Provider that it is safe to stop   You must have a responsible person to drive you home and stay in the waiting area during your procedure. Failure to do so could result in cancellation.  Bring your insurance cards.  *Special Note: Every effort is made to have your procedure done on time. Occasionally there are emergencies that occur at the hospital that may cause delays. Please be patient if a delay does occur.    Follow-Up: Your physician recommends that you return for a follow-up appointment on Friday October 4 at 10:30 with Chelsea Aus, PA   If you need a refill on your cardiac medications before your next appointment, please call your pharmacy.   Thank you for choosing [facility]! Eligha Bridegroom, RN [phone]

## 2017-09-13 NOTE — Progress Notes (Signed)
Cardiology Office Note:    Date:  09/13/2017   ID:  DORINNE GRAEFF, DOB [birth year], MRN [id number]  PCP:  Tracey Harries, MD  Cardiologist:  Kristeen Miss, MD    Referring MD: Tracey Harries, MD   Chief Complaint  Patient presents with  . Shortness of Breath        IKRAN PATMAN is a 62 y.o. female with a hx of atrial fib  TAWNA ALWIN is a 62 y.o. female who is being seen today for the evaluation of paroxysmal atrial fib  at the request of Tracey Harries, MD.  Hx of Turners syndrome, mod AI, chronic diastolic CHF, OSA , PAF  On Eliquis  Cannot really tell when she is in Afib   Was seen by [employer] in Aug, 2017.   No cough , cold symptoms   May 24, 2017:  Tailyn was seen by Tereso Newcomer, PA.  Repeat echo shows severe AI. She is nere to discuss TEE  We discussed the recent finding of severe AI Scheduling a TEE   Sept. 9, 2019:  Has been more shortness of breath - especially with exertion  Has DOE  We have increased her Lasix - has caused some hypotension  Has gained 10 lbs since she was discharged from hospital.  She was admitted to the hospital in early August with rapid atrial fibrillation.  She was having lots of shortness of breath.  She was hypotensive. Aldactone  has been decreased to 1/2 tab  She cannot tell whether or not she is in atrial fibrillation.  We found that she is in rapid atrial for ablation at this time.  Past Medical History:  Diagnosis Date  . Adiposity 06/04/2011  . Adult hypothyroidism 06/04/2011  . Aortic insufficiency    a. severe by echo, TEE 2019.  Marland Kitchen Benign hypertensive heart disease without heart failure 10/16/2010  . CAD (coronary artery disease)    a.  [facility] 07/2017 showed severe AI, nonobstructive CAD (50-60% prox-mid Cx, 40% prox-mid LAD, 30% prox RCA), normal PA pressures, no L-R shunt, LVEDP , EF 50%..  . Cervix abnormality 1993   MILD ATYPICAL ADENOMATOUS HYPERPLASIA OF CERVIX  . Chronic diastolic CHF (congestive heart  failure) (HCC) 08/29/2014  . Chronic respiratory failure with hypercapnia (HCC) 07/06/2012   Followed in Pulmonary clinic/ [facility]  - 05/2012 > 2lpm started at [facility] with HCO3 33-35 on bmets c/w hypercarbia. - PSS 08/29/12 Severe obstructive sleep apnea/hypopnea syndrome, with an AHI of 79 > see OSA - Placed on 24h 02 3lpm at d/c 09/04/14 - stopped 12/2014     . Dermatitis 05-26-12   all over  since 9'13  . Difficult intubation    will plan on spinal anesthesia"small mouth/airway"  . Dyspnea   . Edema of both legs    a. right greater than left, due to Turner's syndrome.  . Gagging episode   . H/O total hip arthroplasty 06/04/2011  . Hyperglycemia 12/23/2011  . Hypertension   . Hypothyroidism   . LAD (lymphadenopathy), mediastinal 08/30/2014  . Obesity 09/16/2014  . Orbital floor (blow-out) closed fracture (HCC) 08/17/2011  . OSA (obstructive sleep apnea) 09/10/2012   NPSG 08/29/12:  Severe obstructive sleep apnea/hypopnea syndrome, with an AHI of 79  06/2016 Bipap 21/17  . Osteoarthritis 10/16/2010   Overview:  Dr. Charlann Boxer (hips and knees)   . PAF (paroxysmal atrial fibrillation) (HCC)   . Periorbital edema   . 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 Severe aortic insufficiency   .  Speech disorder    occ. "stutter"  . Supraventricular tachycardia (HCC) 08/27/2014  . Turner syndrome 06/04/2011  . Turner's syndrome   . Ventricular septal defect   . VSD (ventricular septal defect)    a. ? by TEE 06/2017, not seen on cardiac CT and no shunt by [facility].    Past Surgical History:  Procedure Laterality Date  . CARDIOVERSION  06/14/2017   Procedure: CARDIOVERSION;  Surgeon: Elease Hashimoto Deloris Ping, MD;  Location: [facility] ENDOSCOPY;  Service: Cardiovascular;;  . CERVICAL CONE BIOPSY  1993  . COLPOSCOPY    . DILATION AND CURETTAGE OF UTERUS  1993   CONE BIOPSY  . HERNIA REPAIR    . JOINT REPLACEMENT     hip replacement  . RIGHT/LEFT HEART CATH AND CORONARY ANGIOGRAPHY N/A 07/20/2017   Procedure: RIGHT/LEFT HEART CATH  AND CORONARY ANGIOGRAPHY;  Surgeon: Lyn Records, MD;  Location: [facility] INVASIVE CV LAB;  Service: Cardiovascular;  Laterality: N/A;  . TEE WITHOUT CARDIOVERSION N/A 06/14/2017   Procedure: TRANSESOPHAGEAL ECHOCARDIOGRAM (TEE);  Surgeon: Elease Hashimoto Deloris Ping, MD;  Location: [facility] ENDOSCOPY;  Service: Cardiovascular;  Laterality: N/A;  . TOTAL HIP ARTHROPLASTY     LEFT  . TOTAL KNEE ARTHROPLASTY Right 05/31/2012   Procedure: RIGHT TOTAL KNEE ARTHROPLASTY;  Surgeon: Shelda Pal, MD;  Location: [facility] ORS;  Service: Orthopedics;  Laterality: Right;  . ULTRASOUND GUIDANCE FOR VASCULAR ACCESS  07/20/2017   Procedure: Ultrasound Guidance For Vascular Access;  Surgeon: Lyn Records, MD;  Location: [facility] INVASIVE CV LAB;  Service: Cardiovascular;;  . US ECHOCARDIOGRAPHY  06/20/2009   EF 55-60%    Current Medications: Current Meds  Medication Sig  . acetaminophen (TYLENOL) 500 MG tablet Take 1,000 mg by mouth every 6 (six) hours as needed for moderate pain or headache.  Marland Kitchen atorvastatin (LIPITOR) 20 MG tablet Take 1 tablet (20 mg total) by mouth daily.  Marland Kitchen ELIQUIS 5 MG TABS tablet TAKE 1 TABLET BY MOUTH TWICE DAILY  . famotidine (PEPCID) 40 MG tablet Take 40 mg by mouth 2 (two) times daily.  . furosemide (LASIX) 40 MG tablet Take 1 tablet (40 mg total) by mouth 2 (two) times daily.  Marland Kitchen levothyroxine (SYNTHROID, LEVOTHROID) 75 MCG tablet Take 1 tablet (75 mcg total) by mouth daily before breakfast.  . metoprolol tartrate (LOPRESSOR) 25 MG tablet TAKE 1 TABLET BY MOUTH TWICE DAILY  . potassium chloride (K-DUR,KLOR-CON) 10 MEQ tablet Take 1 tablet (10 mEq total) by mouth 2 (two) times daily.  Marland Kitchen spironolactone (ALDACTONE) 25 MG tablet Take one half tablet (12.5 mg) daily  . traMADol (ULTRAM) 50 MG tablet Take 50 mg by mouth every 6 (six) hours as needed for moderate pain.      Allergies:   Cephalexin and Latex   Social History   Socioeconomic History  . Marital status: Single    Spouse name: Not on file  . Number  of children: Not on file  . Years of education: Not on file  . Highest education level: Not on file  Occupational History  . Occupation: N/A    Employer: NOT EMPLOYED  Social Needs  . Financial resource strain: Not on file  . Food insecurity:    Worry: Not on file    Inability: Not on file  . Transportation needs:    Medical: Not on file    Non-medical: Not on file  Tobacco Use  . Smoking status: Never Smoker  . Smokeless tobacco: Never Used  Substance and Sexual Activity  .  Alcohol use: No  . Drug use: No  . Sexual activity: Never    Birth control/protection: Post-menopausal  Lifestyle  . Physical activity:    Days per week: Not on file    Minutes per session: Not on file  . Stress: Not on file  Relationships  . Social connections:    Talks on phone: Not on file    Gets together: Not on file    Attends religious service: Not on file    Active member of club or organization: Not on file    Attends meetings of clubs or organizations: Not on file    Relationship status: Not on file  Other Topics Concern  . Not on file  Social History Narrative  . Not on file     Family History: The patient's family history includes Atrial fibrillation in her mother; Breast cancer in her cousin; Cancer in her mother; Diabetes in her maternal uncle; Hypertension in her mother. ROS:   Please see the history of present illness.     All other systems reviewed and are negative.  EKGs/Labs/Other Studies Reviewed:    The following studies were reviewed today:   EKG:   Sept. 9, 2019  : Atrial fib with a heart rate of 152.  Recent Labs: 08/16/2017: B Natriuretic Peptide 1,720.4; Magnesium 2.0; TSH 0.263 08/17/2017: ALT 54; Hemoglobin 14.2; Platelets 294 09/10/2017: BUN 32; Creatinine, Ser 1.08; Potassium 3.7; Pro B Natriuretic peptide (BNP) 3,174.0; Sodium 133  Recent Lipid Panel    Component Value Date/Time   CHOL 119 05/05/2017 1002   TRIG 80 05/05/2017 1002   HDL 49 05/05/2017 1002     CHOLHDL 2.4 05/05/2017 1002   CHOLHDL 6.3 08/29/2014 0800   VLDL 33 08/29/2014 0800   LDLCALC 54 05/05/2017 1002    Physical Exam:    Physical Exam: Blood pressure 90/70, pulse (!) 59, height 4\' 8"  (1.422 m), weight 139 lb (63 kg), SpO2 95 %.  GEN:  Well nourished, well developed in no acute distress HEENT: Normal NECK: No JVD; No carotid bruits LYMPHATICS: No lymphadenopathy CARDIAC:  irregl Irreg.  RESPIRATORY:  Clear to auscultation without rales, wheezing or rhonchi  ABDOMEN: Soft, non-tender, non-distended MUSCULOSKELETAL:  1 + leg edema  SKIN: Warm and dry NEUROLOGIC:  Alert and oriented x 3   ASSESSMENT:    1. PAF (paroxysmal atrial fibrillation) (HCC)   2. Aortic valve stenosis, etiology of cardiac valve disease unspecified    PLAN:    In order of problems listed above:  1. Turners Syndrome:      Geneve has severe aortic insufficiency by recent echo   2.  Paroxysmal atrial fib:   Breannah presents today with worsening shortness of breath.   She continues to have volume excess despite effective therapy with Lasix.  Her creatinine has gone up. I think she needs to be cardioverted.  Were having some difficulty in scheduling her for cardioversion.  We have scheduled to have cardioversion on Wednesday at 2 PM To show up in short stay at 12:45    Medication Adjustments/Labs and Tests Ordered: Current medicines are reviewed at length with the patient today.  Concerns regarding medicines are outlined above.  Orders Placed This Encounter  Procedures  . CBC  . Basic Metabolic Panel (BMET)  . EKG 12-Lead    Meds ordered this encounter  Medications  . amiodarone (PACERONE) 400 MG tablet    Sig: Take 1 tablet (400 mg total) by mouth 2 (two) times daily.  Dispense:  180 tablet    Refill:  3    Signed, Kristeen Miss, MD  09/13/2017 5:41 PM    [facility]

## 2017-09-13 NOTE — H&P (View-Only) (Signed)
Cardiology Office Note:    Date:  09/13/2017   ID:  [name], DOB 07/09/1955, MRN [id number]  PCP:  Bouska, David, MD  Cardiologist:  Philip Nahser, MD    Referring MD: Bouska, David, MD   Chief Complaint  Patient presents with  . Shortness of Breath        [name] is a 62 y.o. female with a hx of atrial fib  [name] is a 62 y.o. female who is being seen today for the evaluation of paroxysmal atrial fib  at the request of Bouska, David, MD.  Hx of Turners syndrome, mod AI, chronic diastolic CHF, OSA , PAF  On Eliquis  Cannot really tell when she is in Afib   Was seen by Scott weaver in Aug, 2017.   No cough , cold symptoms   May 24, 2017:  [name] was seen by Scott Weaver, PA.  Repeat echo shows severe AI. She is nere to discuss TEE  We discussed the recent finding of severe AI Scheduling a TEE   Sept. 9, 2019:  Has been more shortness of breath - especially with exertion  Has DOE  We have increased her Lasix - has caused some hypotension  Has gained 10 lbs since she was discharged from hospital.  She was admitted to the hospital in early August with rapid atrial fibrillation.  She was having lots of shortness of breath.  She was hypotensive. Aldactone  has been decreased to 1/2 tab  She cannot tell whether or not she is in atrial fibrillation.  We found that she is in rapid atrial for ablation at this time.  Past Medical History:  Diagnosis Date  . Adiposity 06/04/2011  . Adult hypothyroidism 06/04/2011  . Aortic insufficiency    a. severe by echo, TEE 2019.  . 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 CAD (coronary artery disease)    a.  [facility] 07/2017 showed severe AI, nonobstructive CAD (50-60% prox-mid Cx, 40% prox-mid LAD, 30% prox RCA), normal PA pressures, no L-R shunt, LVEDP 18mmHg, EF 50%..  . Cervix abnormality 1993   MILD ATYPICAL ADENOMATOUS HYPERPLASIA OF CERVIX  . Chronic diastolic CHF (congestive heart  failure) (HCC) 08/29/2014  . Chronic respiratory failure with hypercapnia (HCC) 07/06/2012   [facility]  - 05/2012 > 2lpm started at [facility] with HCO3 33-35 on bmets c/w hypercarbia. - PSS 08/29/12 Severe obstructive sleep apnea/hypopnea syndrome, with an AHI of 79 > see OSA - Placed on 24h 02 3lpm at d/c 09/04/14 - stopped 12/2014     . Dermatitis 05-26-12   all over  since 9'13  . Difficult intubation    will plan on spinal anesthesia"small mouth/airway"  . Dyspnea   . Edema of both legs    a. right greater than left, due to Turner's syndrome.  . Gagging episode   . H/O total hip arthroplasty 06/04/2011  . Hyperglycemia 12/23/2011  . Hypertension   . Hypothyroidism   . LAD (lymphadenopathy), mediastinal 08/30/2014  . Obesity 09/16/2014  . Orbital floor (blow-out) closed fracture (HCC) 08/17/2011  . OSA (obstructive sleep apnea) 09/10/2012   NPSG 08/29/12:  Severe obstructive sleep apnea/hypopnea syndrome, with an AHI of 79  06/2016 Bipap 21/17  . Osteoarthritis 10/16/2010   Overview:  Dr. Olin (hips and knees)   . PAF (paroxysmal atrial fibrillation) (HCC)   . Periorbital edema   . 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 Severe aortic insufficiency   .   Speech disorder    occ. "stutter"  . Supraventricular tachycardia (HCC) 08/27/2014  . Turner syndrome 06/04/2011  . Turner's syndrome   . Ventricular septal defect   . VSD (ventricular septal defect)    a. ? by TEE 06/2017, not seen on cardiac CT and no shunt by [facility].    Past Surgical History:  Procedure Laterality Date  . CARDIOVERSION  06/14/2017   Procedure: CARDIOVERSION;  Surgeon: Nahser, Philip J, MD;  Location: [facility] ENDOSCOPY;  Service: Cardiovascular;;  . CERVICAL CONE BIOPSY  1993  . COLPOSCOPY    . DILATION AND CURETTAGE OF UTERUS  1993   CONE BIOPSY  . HERNIA REPAIR    . JOINT REPLACEMENT     hip replacement  . RIGHT/LEFT HEART CATH AND CORONARY ANGIOGRAPHY N/A 07/20/2017   Procedure: RIGHT/LEFT HEART CATH  AND CORONARY ANGIOGRAPHY;  Surgeon: Smith, Henry W, MD;  Location: [facility] INVASIVE CV LAB;  Service: Cardiovascular;  Laterality: N/A;  . TEE WITHOUT CARDIOVERSION N/A 06/14/2017   Procedure: TRANSESOPHAGEAL ECHOCARDIOGRAM (TEE);  Surgeon: Nahser, Philip J, MD;  Location: [facility] ENDOSCOPY;  Service: Cardiovascular;  Laterality: N/A;  . TOTAL HIP ARTHROPLASTY     LEFT  . TOTAL KNEE ARTHROPLASTY Right 05/31/2012   Procedure: RIGHT TOTAL KNEE ARTHROPLASTY;  Surgeon: Matthew D Olin, MD;  Location: [facility] ORS;  Service: Orthopedics;  Laterality: Right;  . ULTRASOUND GUIDANCE FOR VASCULAR ACCESS  07/20/2017   Procedure: Ultrasound Guidance For Vascular Access;  Surgeon: Smith, Henry W, MD;  Location: [facility] INVASIVE CV LAB;  Service: Cardiovascular;;  . US ECHOCARDIOGRAPHY  06/20/2009   EF 55-60%    Current Medications: Current Meds  Medication Sig  . acetaminophen (TYLENOL) 500 MG tablet Take 1,000 mg by mouth every 6 (six) hours as needed for moderate pain or headache.  . atorvastatin (LIPITOR) 20 MG tablet Take 1 tablet (20 mg total) by mouth daily.  . ELIQUIS 5 MG TABS tablet TAKE 1 TABLET BY MOUTH TWICE DAILY  . famotidine (PEPCID) 40 MG tablet Take 40 mg by mouth 2 (two) times daily.  . furosemide (LASIX) 40 MG tablet Take 1 tablet (40 mg total) by mouth 2 (two) times daily.  . levothyroxine (SYNTHROID, LEVOTHROID) 75 MCG tablet Take 1 tablet (75 mcg total) by mouth daily before breakfast.  . metoprolol tartrate (LOPRESSOR) 25 MG tablet TAKE 1 TABLET BY MOUTH TWICE DAILY  . potassium chloride (K-DUR,KLOR-CON) 10 MEQ tablet Take 1 tablet (10 mEq total) by mouth 2 (two) times daily.  . spironolactone (ALDACTONE) 25 MG tablet Take one half tablet (12.5 mg) daily  . traMADol (ULTRAM) 50 MG tablet Take 50 mg by mouth every 6 (six) hours as needed for moderate pain.      Allergies:   Cephalexin and Latex   Social History   Socioeconomic History  . Marital status: Single    Spouse name: Not on file  . Number  of children: Not on file  . Years of education: Not on file  . Highest education level: Not on file  Occupational History  . Occupation: N/A    Employer: NOT EMPLOYED  Social Needs  . Financial resource strain: Not on file  . Food insecurity:    Worry: Not on file    Inability: Not on file  . Transportation needs:    Medical: Not on file    Non-medical: Not on file  Tobacco Use  . Smoking status: Never Smoker  . Smokeless tobacco: Never Used  Substance and Sexual Activity  .   Alcohol use: No  . Drug use: No  . Sexual activity: Never    Birth control/protection: Post-menopausal  Lifestyle  . Physical activity:    Days per week: Not on file    Minutes per session: Not on file  . Stress: Not on file  Relationships  . Social connections:    Talks on phone: Not on file    Gets together: Not on file    Attends religious service: Not on file    Active member of club or organization: Not on file    Attends meetings of clubs or organizations: Not on file    Relationship status: Not on file  Other Topics Concern  . Not on file  Social History Narrative  . Not on file     Family History: The patient's family history includes Atrial fibrillation in her mother; Breast cancer in her cousin; Cancer in her mother; Diabetes in her maternal uncle; Hypertension in her mother. ROS:   Please see the history of present illness.     All other systems reviewed and are negative.  EKGs/Labs/Other Studies Reviewed:    The following studies were reviewed today:   EKG:   Sept. 9, 2019  : Atrial fib with a heart rate of 152.  Recent Labs: 08/16/2017: B Natriuretic Peptide 1,720.4; Magnesium 2.0; TSH 0.263 08/17/2017: ALT 54; Hemoglobin 14.2; Platelets 294 09/10/2017: BUN 32; Creatinine, Ser 1.08; Potassium 3.7; Pro B Natriuretic peptide (BNP) 3,174.0; Sodium 133  Recent Lipid Panel    Component Value Date/Time   CHOL 119 05/05/2017 1002   TRIG 80 05/05/2017 1002   HDL 49 05/05/2017 1002     CHOLHDL 2.4 05/05/2017 1002   CHOLHDL 6.3 08/29/2014 0800   VLDL 33 08/29/2014 0800   LDLCALC 54 05/05/2017 1002    Physical Exam:    Physical Exam: Blood pressure 90/70, pulse (!) 59, height 4' 8" (1.422 m), weight 139 lb (63 kg), SpO2 95 %.  GEN:  Well nourished, well developed in no acute distress HEENT: Normal NECK: No JVD; No carotid bruits LYMPHATICS: No lymphadenopathy CARDIAC:  irregl Irreg.  RESPIRATORY:  Clear to auscultation without rales, wheezing or rhonchi  ABDOMEN: Soft, non-tender, non-distended MUSCULOSKELETAL:  1 + leg edema  SKIN: Warm and dry NEUROLOGIC:  Alert and oriented x 3   ASSESSMENT:    1. PAF (paroxysmal atrial fibrillation) (HCC)   2. Aortic valve stenosis, etiology of cardiac valve disease unspecified    PLAN:    In order of problems listed above:  1. Turners Syndrome:      Analya has severe aortic insufficiency by recent echo   2.  Paroxysmal atrial fib:   Kashina presents today with worsening shortness of breath.   She continues to have volume excess despite effective therapy with Lasix.  Her creatinine has gone up. I think she needs to be cardioverted.  Were having some difficulty in scheduling her for cardioversion.  We have scheduled to have cardioversion on Wednesday at 2 PM To show up in short stay at 12:45    Medication Adjustments/Labs and Tests Ordered: Current medicines are reviewed at length with the patient today.  Concerns regarding medicines are outlined above.  Orders Placed This Encounter  Procedures  . CBC  . Basic Metabolic Panel (BMET)  . EKG 12-Lead    Meds ordered this encounter  Medications  . amiodarone (PACERONE) 400 MG tablet    Sig: Take 1 tablet (400 mg total) by mouth 2 (two) times daily.      Dispense:  180 tablet    Refill:  3    Signed, Philip Nahser, MD  09/13/2017 5:41 PM    [facility] 

## 2017-09-14 ENCOUNTER — Telehealth: Payer: Self-pay | Admitting: Cardiovascular Disease

## 2017-09-14 ENCOUNTER — Ambulatory Visit: Payer: Medicaid Other | Admitting: Cardiovascular Disease

## 2017-09-14 LAB — CBC
HEMOGLOBIN: 14.5 g/dL (ref 11.1–15.9)
Hematocrit: 46.2 % (ref 34.0–46.6)
MCH: 32.4 pg (ref 26.6–33.0)
MCHC: 31.4 g/dL — ABNORMAL LOW (ref 31.5–35.7)
MCV: 103 fL — ABNORMAL HIGH (ref 79–97)
PLATELETS: 321 10*3/uL (ref 150–450)
RBC: 4.47 x10E6/uL (ref 3.77–5.28)
RDW: 13.9 % (ref 12.3–15.4)
WBC: 11.4 10*3/uL — ABNORMAL HIGH (ref 3.4–10.8)

## 2017-09-14 LAB — BASIC METABOLIC PANEL
BUN/Creatinine Ratio: 24 (ref 12–28)
BUN: 22 mg/dL (ref 8–27)
CHLORIDE: 94 mmol/L — AB (ref 96–106)
CO2: 27 mmol/L (ref 20–29)
CREATININE: 0.93 mg/dL (ref 0.57–1.00)
Calcium: 9.1 mg/dL (ref 8.7–10.3)
GFR calc non Af Amer: 66 mL/min/{1.73_m2} (ref >59)
GFR, EST AFRICAN AMERICAN: 76 mL/min/{1.73_m2} (ref >59)
Glucose: 116 mg/dL — ABNORMAL HIGH (ref 65–99)
Potassium: 3.9 mmol/L (ref 3.5–5.2)
Sodium: 139 mmol/L (ref 134–144)

## 2017-09-14 NOTE — Telephone Encounter (Signed)
New message   Per patient's mother wants a call to discuss Shy's visit to [facility] on 09/15/2017.

## 2017-09-14 NOTE — Telephone Encounter (Signed)
Have attempted to call patient x 2, phone is busy

## 2017-09-14 NOTE — Telephone Encounter (Signed)
Called patient's mother and explained answered her questions about cardioversion which patient has scheduled for tomorrow. I reviewed patient's lab results with her as well. She is aware of follow-up appointment in October and thanked me for the call.

## 2017-09-15 ENCOUNTER — Observation Stay (HOSPITAL_COMMUNITY)
Admission: RE | Admit: 2017-09-15 | Discharge: 2017-09-16 | Disposition: A | Discharge status: Home / Self Care | Payer: Medicaid Other | Source: Ambulatory Visit | Attending: Interventional Cardiology | Admitting: Interventional Cardiology

## 2017-09-15 ENCOUNTER — Ambulatory Visit (HOSPITAL_COMMUNITY): Payer: Medicaid Other | Admitting: Anesthesiology

## 2017-09-15 ENCOUNTER — Other Ambulatory Visit: Payer: Self-pay

## 2017-09-15 ENCOUNTER — Encounter (HOSPITAL_COMMUNITY): Payer: Self-pay | Admitting: Cardiovascular Disease

## 2017-09-15 ENCOUNTER — Ambulatory Visit (HOSPITAL_COMMUNITY)
Admission: RE | Disposition: A | Discharge status: Home / Self Care | Payer: Self-pay | Source: Ambulatory Visit | Attending: Interventional Cardiology

## 2017-09-15 DIAGNOSIS — Q210 Ventricular septal defect: Secondary | ICD-10-CM | POA: Diagnosis not present

## 2017-09-15 DIAGNOSIS — I110 Hypertensive heart disease with heart failure: Secondary | ICD-10-CM | POA: Insufficient documentation

## 2017-09-15 DIAGNOSIS — I2510 Atherosclerotic heart disease of native coronary artery without angina pectoris: Secondary | ICD-10-CM | POA: Diagnosis not present

## 2017-09-15 DIAGNOSIS — I480 Paroxysmal atrial fibrillation: Principal | ICD-10-CM | POA: Insufficient documentation

## 2017-09-15 DIAGNOSIS — G4733 Obstructive sleep apnea (adult) (pediatric): Secondary | ICD-10-CM | POA: Diagnosis not present

## 2017-09-15 DIAGNOSIS — Z881 Allergy status to other antibiotic agents status: Secondary | ICD-10-CM | POA: Insufficient documentation

## 2017-09-15 DIAGNOSIS — R001 Bradycardia, unspecified: Secondary | ICD-10-CM | POA: Diagnosis present

## 2017-09-15 DIAGNOSIS — I351 Nonrheumatic aortic (valve) insufficiency: Secondary | ICD-10-CM

## 2017-09-15 DIAGNOSIS — I5032 Chronic diastolic (congestive) heart failure: Secondary | ICD-10-CM | POA: Diagnosis present

## 2017-09-15 DIAGNOSIS — Z96642 Presence of left artificial hip joint: Secondary | ICD-10-CM | POA: Diagnosis not present

## 2017-09-15 DIAGNOSIS — Z79899 Other long term (current) drug therapy: Secondary | ICD-10-CM | POA: Insufficient documentation

## 2017-09-15 DIAGNOSIS — Z7901 Long term (current) use of anticoagulants: Secondary | ICD-10-CM | POA: Diagnosis not present

## 2017-09-15 DIAGNOSIS — Z91040 Latex allergy status: Secondary | ICD-10-CM | POA: Insufficient documentation

## 2017-09-15 DIAGNOSIS — Z9889 Other specified postprocedural states: Secondary | ICD-10-CM | POA: Insufficient documentation

## 2017-09-15 DIAGNOSIS — Q969 Turner's syndrome, unspecified: Secondary | ICD-10-CM | POA: Diagnosis not present

## 2017-09-15 DIAGNOSIS — E039 Hypothyroidism, unspecified: Secondary | ICD-10-CM | POA: Diagnosis not present

## 2017-09-15 DIAGNOSIS — I495 Sick sinus syndrome: Secondary | ICD-10-CM | POA: Diagnosis not present

## 2017-09-15 DIAGNOSIS — Z79890 Hormone replacement therapy (postmenopausal): Secondary | ICD-10-CM | POA: Diagnosis not present

## 2017-09-15 DIAGNOSIS — Z6830 Body mass index (BMI) 30.0-30.9, adult: Secondary | ICD-10-CM | POA: Diagnosis not present

## 2017-09-15 DIAGNOSIS — I471 Supraventricular tachycardia: Secondary | ICD-10-CM | POA: Insufficient documentation

## 2017-09-15 DIAGNOSIS — I481 Persistent atrial fibrillation: Secondary | ICD-10-CM | POA: Diagnosis not present

## 2017-09-15 DIAGNOSIS — E785 Hyperlipidemia, unspecified: Secondary | ICD-10-CM | POA: Diagnosis not present

## 2017-09-15 DIAGNOSIS — Z96651 Presence of right artificial knee joint: Secondary | ICD-10-CM | POA: Diagnosis not present

## 2017-09-15 DIAGNOSIS — E669 Obesity, unspecified: Secondary | ICD-10-CM | POA: Diagnosis present

## 2017-09-15 DIAGNOSIS — I352 Nonrheumatic aortic (valve) stenosis with insufficiency: Secondary | ICD-10-CM | POA: Insufficient documentation

## 2017-09-15 DIAGNOSIS — Z955 Presence of coronary angioplasty implant and graft: Secondary | ICD-10-CM | POA: Diagnosis not present

## 2017-09-15 DIAGNOSIS — Z8249 Family history of ischemic heart disease and other diseases of the circulatory system: Secondary | ICD-10-CM | POA: Insufficient documentation

## 2017-09-15 DIAGNOSIS — I4819 Other persistent atrial fibrillation: Secondary | ICD-10-CM

## 2017-09-15 HISTORY — DX: Bradycardia, unspecified: R00.1

## 2017-09-15 HISTORY — PX: CARDIOVERSION: SHX1299

## 2017-09-15 LAB — BRAIN NATRIURETIC PEPTIDE: B NATRIURETIC PEPTIDE 5: 3020.7 pg/mL — AB (ref 0.0–100.0)

## 2017-09-15 LAB — TSH: TSH: 8.274 u[IU]/mL — ABNORMAL HIGH (ref 0.350–4.500)

## 2017-09-15 LAB — T4, FREE: Free T4: 1.51 ng/dL (ref 0.82–1.77)

## 2017-09-15 LAB — MRSA PCR SCREENING: MRSA by PCR: NEGATIVE

## 2017-09-15 SURGERY — CARDIOVERSION
Anesthesia: Monitor Anesthesia Care

## 2017-09-15 MED ORDER — SODIUM CHLORIDE 0.9 % IV SOLN
1.0000 mg/kg/h | INTRAVENOUS | Status: DC
  Administered 2017-09-15 – 2017-09-16 (×2): 1 mg/kg/h via INTRAVENOUS
  Filled 2017-09-15 (×3): qty 100

## 2017-09-15 MED ORDER — AMIODARONE HCL 200 MG PO TABS
200.0000 mg | ORAL_TABLET | Freq: Every day | ORAL | Status: DC
  Administered 2017-09-15 – 2017-09-16 (×2): 200 mg via ORAL
  Filled 2017-09-15 (×2): qty 1

## 2017-09-15 MED ORDER — APIXABAN 5 MG PO TABS
5.0000 mg | ORAL_TABLET | Freq: Two times a day (BID) | ORAL | Status: DC
  Administered 2017-09-15 – 2017-09-16 (×2): 5 mg via ORAL
  Filled 2017-09-15 (×3): qty 1

## 2017-09-15 MED ORDER — ACETAMINOPHEN 325 MG PO TABS: 650.0000 mg | ORAL_TABLET | ORAL | Status: DC | PRN

## 2017-09-15 MED ORDER — ATORVASTATIN CALCIUM 20 MG PO TABS
20.0000 mg | ORAL_TABLET | Freq: Every day | ORAL | Status: DC
  Administered 2017-09-15 – 2017-09-16 (×2): 20 mg via ORAL
  Filled 2017-09-15 (×2): qty 1

## 2017-09-15 MED ORDER — ONDANSETRON HCL 4 MG/2ML IJ SOLN: 4.0000 mg | Freq: Four times a day (QID) | INTRAMUSCULAR | Status: DC | PRN

## 2017-09-15 MED ORDER — ONDANSETRON HCL 4 MG/2ML IJ SOLN
4.0000 mg | Freq: Four times a day (QID) | INTRAMUSCULAR | Status: DC | PRN
  Filled 2017-09-15: qty 2

## 2017-09-15 MED ORDER — CALCIUM GLUCONATE 10 % IV SOLN: 1.0000 g | Freq: Once | INTRAVENOUS | Status: DC

## 2017-09-15 MED ORDER — SODIUM CHLORIDE 0.9 % IV SOLN: INTRAVENOUS | Status: DC

## 2017-09-15 MED ORDER — PHENYLEPHRINE HCL 10 MG/ML IJ SOLN
INTRAMUSCULAR | Status: DC | PRN
  Administered 2017-09-15: 10 ug via INTRAVENOUS
  Administered 2017-09-15: 100 ug via INTRAVENOUS
  Administered 2017-09-15: 10 ug via INTRAVENOUS
  Administered 2017-09-15 (×3): 100 ug via INTRAVENOUS

## 2017-09-15 MED ORDER — SODIUM CHLORIDE 0.9 % IV SOLN: 1.0000 g | Freq: Once | INTRAVENOUS | Status: DC

## 2017-09-15 MED ORDER — VITAMIN D3 25 MCG (1000 UNIT) PO TABS
1000.0000 [IU] | ORAL_TABLET | Freq: Every day | ORAL | Status: DC
  Administered 2017-09-16: 1000 [IU] via ORAL
  Filled 2017-09-15 (×4): qty 1

## 2017-09-15 MED ORDER — FAMOTIDINE 20 MG PO TABS
40.0000 mg | ORAL_TABLET | Freq: Two times a day (BID) | ORAL | Status: DC
  Administered 2017-09-15 – 2017-09-16 (×2): 40 mg via ORAL
  Filled 2017-09-15 (×2): qty 2

## 2017-09-15 MED ORDER — AMIODARONE HCL 200 MG PO TABS: 400.0000 mg | ORAL_TABLET | Freq: Two times a day (BID) | ORAL | Status: DC

## 2017-09-15 MED ORDER — CALCIUM CHLORIDE 10 % IV SOLN
INTRAVENOUS | Status: DC | PRN
  Administered 2017-09-15: 1 g via INTRAVENOUS

## 2017-09-15 MED ORDER — ONDANSETRON HCL 4 MG/2ML IJ SOLN
4.0000 mg | Freq: Four times a day (QID) | INTRAMUSCULAR | Status: DC | PRN
  Administered 2017-09-15: 4 mg via INTRAVENOUS

## 2017-09-15 MED ORDER — TRAMADOL HCL 50 MG PO TABS: 50.0000 mg | ORAL_TABLET | Freq: Four times a day (QID) | ORAL | Status: DC | PRN

## 2017-09-15 MED ORDER — LEVOTHYROXINE SODIUM 75 MCG PO TABS
75.0000 ug | ORAL_TABLET | Freq: Every day | ORAL | Status: DC
  Administered 2017-09-16: 75 ug via ORAL
  Filled 2017-09-15: qty 1

## 2017-09-15 MED ORDER — SODIUM CHLORIDE 0.9 % IV SOLN
INTRAVENOUS | Status: DC
  Administered 2017-09-15: 500 mL via INTRAVENOUS
  Administered 2017-09-15: 13:00:00 via INTRAVENOUS

## 2017-09-15 NOTE — Anesthesia Postprocedure Evaluation (Signed)
Anesthesia Post Note  Patient: [name]  Procedure(s) Performed: CARDIOVERSION (N/A )     Patient location during evaluation: Endoscopy Anesthesia Type: General Level of consciousness: lethargic Pain management: pain level controlled Vital Signs Assessment: vitals unstable Respiratory status: patient connected to face mask oxygen Cardiovascular status: unstable Postop Assessment: no apparent nausea or vomiting Anesthetic complications: no Comments: To the unit for observation per Dr. Duke Salvia    Last Vitals:  Vitals:   09/15/17 1431 09/15/17 1434  BP:  134/65  Pulse: (!) 59   Resp: 20   Temp:    SpO2: (!) 89%     Last Pain:  Vitals:   09/15/17 1326  TempSrc: Oral  PainSc: 0-No pain   Pain Goal:                 Mahasin Riviere JR,JOHN Eulene Pekar

## 2017-09-15 NOTE — Interval H&P Note (Signed)
History and Physical Interval Note:  09/15/2017 1:55 PM  [name]  has presented today for surgery, with the diagnosis of a fib  The various methods of treatment have been discussed with the patient and family. After consideration of risks, benefits and other options for treatment, the patient has consented to  Procedure(s): CARDIOVERSION (N/A) as a surgical intervention .  The patient's history has been reviewed, patient examined, no change in status, stable for surgery.  I have reviewed the patient's chart and labs.  Questions were answered to the patient's satisfaction.     Chilton Si, MD

## 2017-09-15 NOTE — CV Procedure (Addendum)
Electrical Cardioversion Procedure Note [name] [id number] 1955/06/07  Procedure: Electrical Cardioversion Indications:  Atrial Fibrillation  Procedure Details Consent: Risks of procedure as well as the alternatives and risks of each were explained to the (patient/caregiver).  Consent for procedure obtained. Time Out: Verified patient identification, verified procedure, site/side was marked, verified correct patient position, special equipment/implants available, medications/allergies/relevent history reviewed, required imaging and test results available.  Performed  Patient placed on cardiac monitor, pulse oximetry, supplemental oxygen as necessary.  Sedation given: propofol Pacer pads placed anterior and posterior chest.  Cardioverted 1 time(s).  Cardioverted at 150J.  Evaluation Findings: Post procedure EKG shows: sinus rhythm with PACs and atrial bigeminy Complications: None Patient did tolerate procedure well.   Donna Si, MD 09/15/2017, 2:14 PM   Addendum: After cardioversion [name] developed sinus bradycardia in the 30s.  Her BP dropped to 60/40.  She received multiple doses of phenylephrine without significant improvement.  She received 1L NS bolus.  After getting 1g of calcium chloride her HR improved to the 60s and BP to 140/70.  We will admit to 2H for observation and hold her beta blocker.  [name] C. Duke Salvia, MD, [facility] 09/15/2017 2:43 PM

## 2017-09-15 NOTE — Anesthesia Procedure Notes (Signed)
Procedure Name: General with mask airway Date/Time: 09/15/2017 2:02 PM Performed by: Gwenyth Allegra, CRNA Pre-anesthesia Checklist: Patient identified, Emergency Drugs available, Suction available, Patient being monitored and Timeout performed Patient Re-evaluated:Patient Re-evaluated prior to induction Oxygen Delivery Method: Ambu bag Preoxygenation: Pre-oxygenation with 100% oxygen Induction Type: IV induction

## 2017-09-15 NOTE — Progress Notes (Signed)
Received a call from lab about blood sample that hemolyzed. Lab will redraw.

## 2017-09-15 NOTE — H&P (Addendum)
Cardiology Admission History and Physical:   Patient ID: [name] MRN: [id number]; DOB: 15-Sep-1955   Admission date: 09/15/2017  Primary Care Provider: Tracey Harries, MD Primary Cardiologist: Kristeen Miss, MD   Chief Complaint:  bradycardia  Patient Profile:   [name] is a 62 y.o. female with severe aortic regurgitation, chronic diastolic heart failure, OSA, persistent atrial fibrillation, and Turner's syndrome here with bradycardia after cardioversion  History of Present Illness:   [name] presented for cardioversion on 09/15/2017.  After cardioversion she was in sinus rhythm and PACs with some atrial bigeminy.  Her heart rate was in the 60s.  However, she then developed sinus bradycardia to the 30s.  She was hypotensive to the 60s over 40s and required IV fluid and pushes of phenylephrine.  Her heart rate improved with calcium chloride 1 g IV x2.  She was then started on a calcium gluconate drip and admitted to the ICU.  Her last dose of metoprolol was this morning.  [name] was found to have severe aortic regurgitation on echo 05/2017.  LVEF is 55 to 60%.  She underwent TEE 06/14/2017 that revealed LVEF 50 to 55% with severe aortic regurgitation.  The procedure was aborted early due to airway concerns and SVT which required cardioversion.  She was also found to have a small to moderate sized perimembranous VSD.  She underwent left and right heart catheterization 07/2017 that revealed nonobstructive CAD in the mid left circumflex.   Past Medical History:  Diagnosis Date  . Adiposity 06/04/2011  . Adult hypothyroidism 06/04/2011  . Aortic insufficiency    a. severe by echo, TEE 2019.  [facility] Benign hypertensive heart disease without heart failure 10/16/2010  . CAD (coronary artery disease)    a.  [facility] 07/2017 showed severe AI, nonobstructive CAD (50-60% prox-mid Cx, 40% prox-mid LAD, 30% prox RCA), normal PA pressures, no L-R shunt, LVEDP , EF 50%..  . Cervix  abnormality 1993   MILD ATYPICAL ADENOMATOUS HYPERPLASIA OF CERVIX  . Chronic diastolic CHF (congestive heart failure) (HCC) 08/29/2014  . Chronic respiratory failure with hypercapnia (HCC) 07/06/2012   Followed in Pulmonary clinic/ [facility]  - 05/2012 > 2lpm started at [facility] with HCO3 33-35 on bmets c/w hypercarbia. - PSS 08/29/12 Severe obstructive sleep apnea/hypopnea syndrome, with an AHI of 79 > see OSA - Placed on 24h 02 3lpm at d/c 09/04/14 - stopped 12/2014     . Dermatitis 05-26-12   all over  since 9'13  . Difficult intubation    will plan on spinal anesthesia"small mouth/airway"  . Dyspnea   . Edema of both legs    a. right greater than left, due to Turner's syndrome.  . Gagging episode   . H/O total hip arthroplasty 06/04/2011  . Hyperglycemia 12/23/2011  . Hypertension   . Hypothyroidism   . LAD (lymphadenopathy), mediastinal 08/30/2014  . Obesity 09/16/2014  . Orbital floor (blow-out) closed fracture (HCC) 08/17/2011  . OSA (obstructive sleep apnea) 09/10/2012   NPSG 08/29/12:  Severe obstructive sleep apnea/hypopnea syndrome, with an AHI of 79  06/2016 Bipap 21/17  . Osteoarthritis 10/16/2010   Overview:  Dr. Charlann Boxer (hips and knees)   . PAF (paroxysmal atrial fibrillation) (HCC)   . Periorbital edema   . 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 Severe aortic insufficiency   . Speech disorder    occ. "stutter"  . Supraventricular tachycardia (HCC) 08/27/2014  . Turner syndrome 06/04/2011  . Turner's syndrome   . Ventricular septal defect   .  VSD (ventricular septal defect)    a. ? by TEE 06/2017, not seen on cardiac CT and no shunt by [facility].    Past Surgical History:  Procedure Laterality Date  . CARDIOVERSION  06/14/2017   Procedure: CARDIOVERSION;  Surgeon: Elease Hashimoto Deloris Ping, MD;  Location: [facility] ENDOSCOPY;  Service: Cardiovascular;;  . CERVICAL CONE BIOPSY  1993  . COLPOSCOPY    . DILATION AND CURETTAGE OF UTERUS  1993   CONE BIOPSY  . HERNIA REPAIR    . JOINT REPLACEMENT     hip  replacement  . RIGHT/LEFT HEART CATH AND CORONARY ANGIOGRAPHY N/A 07/20/2017   Procedure: RIGHT/LEFT HEART CATH AND CORONARY ANGIOGRAPHY;  Surgeon: Lyn Records, MD;  Location: [facility] INVASIVE CV LAB;  Service: Cardiovascular;  Laterality: N/A;  . TEE WITHOUT CARDIOVERSION N/A 06/14/2017   Procedure: TRANSESOPHAGEAL ECHOCARDIOGRAM (TEE);  Surgeon: Elease Hashimoto Deloris Ping, MD;  Location: [facility] ENDOSCOPY;  Service: Cardiovascular;  Laterality: N/A;  . TOTAL HIP ARTHROPLASTY     LEFT  . TOTAL KNEE ARTHROPLASTY Right 05/31/2012   Procedure: RIGHT TOTAL KNEE ARTHROPLASTY;  Surgeon: Shelda Pal, MD;  Location: [facility] ORS;  Service: Orthopedics;  Laterality: Right;  . ULTRASOUND GUIDANCE FOR VASCULAR ACCESS  07/20/2017   Procedure: Ultrasound Guidance For Vascular Access;  Surgeon: Lyn Records, MD;  Location: [facility] INVASIVE CV LAB;  Service: Cardiovascular;;  . US ECHOCARDIOGRAPHY  06/20/2009   EF 55-60%     Medications Prior to Admission: Prior to Admission medications   Medication Sig Start Date End Date Taking? Authorizing Provider  acetaminophen (TYLENOL) 500 MG tablet Take 1,000 mg by mouth every 6 (six) hours as needed for moderate pain or headache.   Yes [provider]  amiodarone (PACERONE) 400 MG tablet Take 1 tablet (400 mg total) by mouth 2 (two) times daily. 09/13/17  Yes Nahser, Deloris Ping, MD  atorvastatin (LIPITOR) 20 MG tablet Take 1 tablet (20 mg total) by mouth daily. 03/17/17 03/12/18 Yes Weaver, Scott T, PA-C  cholecalciferol (VITAMIN D) 1000 units tablet Take 1,000 Units by mouth daily.   Yes [provider]  ELIQUIS 5 MG TABS tablet TAKE 1 TABLET BY MOUTH TWICE DAILY Patient taking differently: Take 5 mg by mouth 2 (two) times daily.  07/23/17  Yes Nahser, Deloris Ping, MD  famotidine (PEPCID) 40 MG tablet Take 40 mg by mouth 2 (two) times daily.   Yes [provider]  furosemide (LASIX) 40 MG tablet Take 1 tablet (40 mg total) by mouth 2 (two) times daily. 09/08/17 12/07/17 Yes  Nahser, Deloris Ping, MD  levothyroxine (SYNTHROID, LEVOTHROID) 75 MCG tablet Take 1 tablet (75 mcg total) by mouth daily before breakfast. 08/21/17  Yes Ali Lowe, MD  metoprolol tartrate (LOPRESSOR) 25 MG tablet TAKE 1 TABLET BY MOUTH TWICE DAILY Patient taking differently: Take 25 mg by mouth 2 (two) times daily.  06/03/17  Yes Nahser, Deloris Ping, MD  potassium chloride (K-DUR,KLOR-CON) 10 MEQ tablet Take 1 tablet (10 mEq total) by mouth 2 (two) times daily. 09/08/17  Yes Nahser, Deloris Ping, MD  spironolactone (ALDACTONE) 25 MG tablet Take one half tablet (12.5 mg) daily Patient taking differently: Take 12.5 mg by mouth daily.  08/31/17  Yes Nahser, Deloris Ping, MD  traMADol (ULTRAM) 50 MG tablet Take 50 mg by mouth every 6 (six) hours as needed for moderate pain.    Yes [provider]     Allergies:    Allergies  Allergen Reactions  . Cephalexin Rash and  Other (See Comments)    Prefers not to have ? Psoriasis skin condition  . Latex Rash    Social History:   Social History   Socioeconomic History  . Marital status: Single    Spouse name: Not on file  . Number of children: Not on file  . Years of education: Not on file  . Highest education level: Not on file  Occupational History  . Occupation: N/A    Employer: NOT EMPLOYED  Social Needs  . Financial resource strain: Not on file  . Food insecurity:    Worry: Not on file    Inability: Not on file  . Transportation needs:    Medical: Not on file    Non-medical: Not on file  Tobacco Use  . Smoking status: Never Smoker  . Smokeless tobacco: Never Used  Substance and Sexual Activity  . Alcohol use: No  . Drug use: No  . Sexual activity: Never    Birth control/protection: Post-menopausal  Lifestyle  . Physical activity:    Days per week: Not on file    Minutes per session: Not on file  . Stress: Not on file  Relationships  . Social connections:    Talks on phone: Not on file    Gets together: Not on file    Attends  religious service: Not on file    Active member of club or organization: Not on file    Attends meetings of clubs or organizations: Not on file    Relationship status: Not on file  . Intimate partner violence:    Fear of current or ex partner: Not on file    Emotionally abused: Not on file    Physically abused: Not on file    Forced sexual activity: Not on file  Other Topics Concern  . Not on file  Social History Narrative  . Not on file    Family History:   The patient's family history includes Atrial fibrillation in her mother; Breast cancer in her cousin; Cancer in her mother; Diabetes in her maternal uncle; Hypertension in her mother.    ROS:  Please see the history of present illness.  All other ROS reviewed and negative.     Physical Exam/Data:   Vitals:   09/15/17 1500 09/15/17 1503 09/15/17 1505 09/15/17 1600  BP: (!) 119/59 138/65 (!) 144/76 122/63  Pulse: (!) 37 (!) 50 65 (!) 117  Resp: 19  13 18   Temp:      TempSrc:      SpO2: 100% 98% 100% 100%  Weight:      Height:        Intake/Output Summary (Last 24 hours) at 09/15/2017 1645 Last data filed at 09/15/2017 1437 Gross per 24 hour  Intake 600 ml  Output -  Net 600 ml   Filed Weights   09/15/17 1326  Weight: 62.6 kg   VS:  BP 122/63   Pulse (!) 117   Temp 97.6 F (36.4 C) (Oral)   Resp 18   Ht 4\' 8"  (1.422 m)   Wt 62.6 kg   SpO2 100%   BMI 30.94 kg/m  , BMI Body mass index is 30.94 kg/m. GENERAL:  Critically ill-appearing HEENT: Pupils equal round and reactive, fundi not visualized, oral mucosa unremarkable NECK:  No jugular venous distention, waveform within normal limits, carotid upstroke brisk and symmetric, no bruits.  Webbed neck LUNGS:  Clear to auscultation bilaterally HEART:  Bradycardic.  Regular rhythm  PMI not displaced or  sustained,S1 and S2 within normal limits, no S3, no S4, no clicks, no rubs, III/VI diastolic murmur at LUSB ABD:  Flat, positive bowel sounds normal in frequency in  pitch, no bruits, no rebound, no guarding, no midline pulsatile mass, no hepatomegaly, no splenomegaly EXT:  2 plus pulses throughout, no edema, no cyanosis no clubbing SKIN:  No rashes no nodules NEURO:  Cranial nerves II through XII grossly intact, motor grossly intact throughout PSYCH:  Cognitively intact, oriented to person place and time   EKG:  The ECG that was done 09/13/17 was personally reviewed and demonstrates atrial fibrillation rate 152 bpm.   Relevant CV Studies: Echo 05/05/17:  Study Conclusions  - Left ventricle: The cavity size was normal. Wall thickness was   normal. Systolic function was normal. The estimated ejection   fraction was in the range of 55% to 60%. Doppler parameters are   consistent with abnormal left ventricular relaxation (grade 1   diastolic dysfunction). - Aortic valve: Trileaflet; mildly calcified leaflets. Mildly   elevated mean gradient across aortic valve but the valve appears   to open well. Suspect this is due to high flow with severe aortic   insufficiency. There was severe regurgitation. Mean gradient (S):   15 mm Hg. - Mitral valve: There was no significant regurgitation. - Left atrium: The atrium was moderately dilated. - Right ventricle: The cavity size was normal. Systolic function   was normal. - Pulmonary arteries: No complete TR doppler jet so unable to   estimate PA systolic pressure. - Inferior vena cava: The vessel was normal in size. The   respirophasic diameter changes were in the normal range (= 50%),   consistent with normal central venous pressure.  TEE 06/14/17: Study Conclusions  - Left ventricle: The estimated ejection fraction was in the range   of 50% to 55%. No evidence of thrombus. - Aortic valve: There was severe regurgitation. - Mitral valve: There was mild regurgitation. - Left atrium: No evidence of thrombus in the atrial cavity or   appendage. - Right atrium: No evidence of thrombus in the atrial cavity  or   appendage.  Impressions:  - Low normal LV function   Severe AI . The procedure was aborted early due to airway   concerns and SVT which required cardioversion   Small - moderate sized perimembranous VSD . Successful   cardioversion. No cardiac source of emboli was indentified.  RHC/LHC 07/20/17:  Severe aortic regurgitation  Low normal LV systolic function.  LV enlargement, mild, with globular appearing LV cavity.  EF 50%.  LVEDP 18 mmHg.  Nonobstructive coronary disease with 50 to 60% proximal to mid circumflex.  Segmental 40% proximal and mid LAD disease.  30% proximal RCA.  Left main is normal.  Normal pulmonary artery pressures.  No left to right shunt identified by oximetry run.  RECOMMENDATIONS:   Will refer to Dr. Evelene Croon for consideration of surgical aortic valve management.  Laboratory Data:  Chemistry Recent Labs  Lab 09/10/17 1535 09/13/17 1642  NA 133* 139  K 3.7 3.9  CL 95* 94*  CO2 30 27  GLUCOSE 124* 116*  BUN 32* 22  CREATININE 1.08 0.93  CALCIUM 8.9 9.1  GFRNONAA  --  66  GFRAA  --  76    No results for input(s): PROT, ALBUMIN, AST, ALT, ALKPHOS, BILITOT in the last 168 hours. Hematology Recent Labs  Lab 09/13/17 1642  WBC 11.4*  RBC 4.47  HGB 14.5  HCT 46.2  MCV 103*  MCH 32.4  MCHC 31.4*  RDW 13.9  PLT 321   Cardiac EnzymesNo results for input(s): TROPONINI in the last 168 hours. No results for input(s): TROPIPOC in the last 168 hours.  BNP Recent Labs  Lab 09/10/17 1535  PROBNP 3,174.0*    DDimer No results for input(s): DDIMER in the last 168 hours.  Radiology/Studies:  No results found.  Assessment and Plan:   # Bradycardia: # SSS:  Ms. Parsley became bradycardic after cardioversion.  She is on a very low dose of metoprolol.  She is currently on a calcium gluconate drip.  If she continues to have bradycardia she may require pacemaker implantation for sick sinus syndrome.  # Persistent atrial  fibrillation: Currently sinus bradycardia after cardioversion.  Holding her metoprolol.  Continue home Eliquis and reduce amiodarone to 200mg  daily.  # Severe AR: Currently euvolemic.  Undergoing evaluation for SAVR with Dr. Laneta Simmers.  # Non-obstructive CAD:  # Hyperlipidemia: Continue atorvastatin.  Holding metoprolol as above.  She isn't on aspirin given that she is on Eliquis.   Severity of Illness: The appropriate patient status for this patient is INPATIENT. Inpatient status is judged to be reasonable and necessary in order to provide the required intensity of service to ensure the patient's safety. The patient's presenting symptoms, physical exam findings, and initial radiographic and laboratory data in the context of their chronic comorbidities is felt to place them at high risk for further clinical deterioration. Furthermore, it is not anticipated that the patient will be medically stable for discharge from the hospital within 2 midnights of admission. The following factors support the patient status of inpatient.   " The patient's presenting symptoms include bradycardia. " The worrisome physical exam findings include hypotension. " The initial radiographic and laboratory data are worrisome because of n/a. " The chronic co-morbidities include severe aortic regurgitation.   * I certify that at the point of admission it is my clinical judgment that the patient will require inpatient hospital care spanning beyond 2 midnights from the point of admission due to high intensity of service, high risk for further deterioration and high frequency of surveillance required.*    For questions or updates, please contact [facility] Please consult [email or web address] for contact info under    Time spent: 45 minutes-Greater than 50% of this time was spent in counseling, explanation of diagnosis, planning of further management, and coordination of care.     Signed, Chilton Si, MD  09/15/2017  4:45 PM

## 2017-09-15 NOTE — Anesthesia Preprocedure Evaluation (Signed)
Anesthesia Evaluation  Patient identified by MRN, date of birth, ID band Patient awake    Reviewed: Allergy & Precautions, NPO status , Patient's Chart, lab work & pertinent test results  History of Anesthesia Complications (+) DIFFICULT AIRWAY  Airway Mallampati: IV  TM Distance: <3 FB Neck ROM: Full  Mouth opening: Limited Mouth Opening  Dental  (+) Poor Dentition   Pulmonary sleep apnea ,    Pulmonary exam normal breath sounds clear to auscultation       Cardiovascular hypertension, Normal cardiovascular exam+ dysrhythmias Atrial Fibrillation + Valvular Problems/Murmurs AI  Rhythm:Regular Rate:Normal  . Mod AI by echo 2013.//  b. Echo 10/17: EF 60-65%, normal wall motion, grade 1 diastolic dysfunction, moderate AI, ascending aorta 35 mm, aortic root 30 mm   Neuro/Psych negative neurological ROS  negative psych ROS   GI/Hepatic negative GI ROS, Neg liver ROS,   Endo/Other  negative endocrine ROSHypothyroidism   Renal/GU negative Renal ROS  negative genitourinary   Musculoskeletal negative musculoskeletal ROS (+)   Abdominal Normal abdominal exam  (+)   Peds negative pediatric ROS (+)  Hematology negative hematology ROS (+)   Anesthesia Other Findings   Reproductive/Obstetrics negative OB ROS                             Anesthesia Physical  Anesthesia Plan  ASA: III  Anesthesia Plan: MAC and General   Post-op Pain Management:    Induction: Intravenous  PONV Risk Score and Plan: 0  Airway Management Planned: Natural Airway and Mask  Additional Equipment:   Intra-op Plan:   Post-operative Plan:   Informed Consent: I have reviewed the patients History and Physical, chart, labs and discussed the procedure including the risks, benefits and alternatives for the proposed anesthesia with the patient or authorized representative who has indicated his/her understanding and  acceptance.   Dental advisory given  Plan Discussed with: CRNA and Surgeon  Anesthesia Plan Comments:         Anesthesia Quick Evaluation

## 2017-09-15 NOTE — Progress Notes (Signed)
Patient's HR dropped to mid 30's got verbal order to give another 1g of calcium chloride. Given to patient @ 1500, will continue to monitor. Unable to place order of calcium chloride through [facility].

## 2017-09-15 NOTE — Transfer of Care (Signed)
Immediate Anesthesia Transfer of Care Note  Patient: [name]  Procedure(s) Performed: CARDIOVERSION (N/A )  Patient Location: Endoscopy Unit  Anesthesia Type:General  Level of Consciousness: awake and alert   Airway & Oxygen Therapy: Patient Spontanous Breathing and Patient connected to face mask oxygen  Post-op Assessment: Report given to RN and Post -op Vital signs reviewed and stable  Post vital signs: Reviewed and stable  Last Vitals:  Vitals Value Taken Time  BP 134/65 09/15/2017  2:34 PM  Temp    Pulse 63 09/15/2017  2:42 PM  Resp 25 09/15/2017  2:42 PM  SpO2 86 % 09/15/2017  2:42 PM  Vitals shown include unvalidated device data.  Last Pain:  Vitals:   09/15/17 1326  TempSrc: Oral  PainSc: 0-No pain         Complications: No apparent anesthesia complications

## 2017-09-15 NOTE — Progress Notes (Signed)
Patient in Endoscopy for cardioversion. Post cardioversion she developed sinus bradycardia with HR into 30's, calcium chloride was given and she was responsive and came up to the 60's but MD Duke Salvia wanted her to stay overnight in a medical ICU bed. Orders placed for patient to be admitted, MD Duke Salvia updated patient's family.

## 2017-09-16 ENCOUNTER — Encounter (HOSPITAL_COMMUNITY): Payer: Self-pay | Admitting: Physician Assistant

## 2017-09-16 DIAGNOSIS — R001 Bradycardia, unspecified: Secondary | ICD-10-CM | POA: Diagnosis not present

## 2017-09-16 DIAGNOSIS — Q969 Turner's syndrome, unspecified: Secondary | ICD-10-CM | POA: Diagnosis not present

## 2017-09-16 DIAGNOSIS — I351 Nonrheumatic aortic (valve) insufficiency: Secondary | ICD-10-CM | POA: Diagnosis not present

## 2017-09-16 DIAGNOSIS — I480 Paroxysmal atrial fibrillation: Secondary | ICD-10-CM | POA: Diagnosis not present

## 2017-09-16 DIAGNOSIS — I481 Persistent atrial fibrillation: Secondary | ICD-10-CM | POA: Diagnosis not present

## 2017-09-16 LAB — COMPREHENSIVE METABOLIC PANEL
ALBUMIN: 3.1 g/dL — AB (ref 3.5–5.0)
ALT: 219 U/L — ABNORMAL HIGH (ref 0–44)
AST: 181 U/L — AB (ref 15–41)
Alkaline Phosphatase: 130 U/L — ABNORMAL HIGH (ref 38–126)
Anion gap: 11 (ref 5–15)
BUN: 29 mg/dL — AB (ref 8–23)
CHLORIDE: 97 mmol/L — AB (ref 98–111)
CO2: 26 mmol/L (ref 22–32)
Calcium: 12.9 mg/dL — ABNORMAL HIGH (ref 8.9–10.3)
Creatinine, Ser: 1.14 mg/dL — ABNORMAL HIGH (ref 0.44–1.00)
GFR calc Af Amer: 58 mL/min — ABNORMAL LOW (ref >60)
GFR calc non Af Amer: 50 mL/min — ABNORMAL LOW (ref >60)
GLUCOSE: 89 mg/dL (ref 70–99)
Potassium: 5.1 mmol/L (ref 3.5–5.1)
SODIUM: 134 mmol/L — AB (ref 135–145)
Total Bilirubin: 3 mg/dL — ABNORMAL HIGH (ref 0.3–1.2)
Total Protein: 4.9 g/dL — ABNORMAL LOW (ref 6.5–8.1)

## 2017-09-16 LAB — CBC
HCT: 48.8 % — ABNORMAL HIGH (ref 36.0–46.0)
HEMOGLOBIN: 15.8 g/dL — AB (ref 12.0–15.0)
MCH: 33.1 pg (ref 26.0–34.0)
MCHC: 32.4 g/dL (ref 30.0–36.0)
MCV: 102.1 fL — ABNORMAL HIGH (ref 78.0–100.0)
Platelets: 284 10*3/uL (ref 150–400)
RBC: 4.78 MIL/uL (ref 3.87–5.11)
RDW: 13.9 % (ref 11.5–15.5)
WBC: 11.9 10*3/uL — AB (ref 4.0–10.5)

## 2017-09-16 LAB — MAGNESIUM: Magnesium: 2.1 mg/dL (ref 1.7–2.4)

## 2017-09-16 MED ORDER — AMIODARONE HCL 200 MG PO TABS: 200.0000 mg | ORAL_TABLET | Freq: Every day | ORAL | 2 refills | Status: AC

## 2017-09-16 MED FILL — Medication: Qty: 1 | Status: AC

## 2017-09-16 NOTE — Discharge Summary (Addendum)
Discharge Summary    Patient ID: [name],  MRN: [id number], DOB/AGE: 63-Aug-1957 63 y.o.  Admit date: 09/15/2017 Discharge date: 09/16/2017  Primary Care Provider: Tracey HarriesBouska, David Primary Cardiologist: Kristeen MissPhilip Nahser, MD Primary Electrophysiologist:  None  Discharge Diagnoses    Principal Problem:   Bradycardia Active Problems:   Persistent atrial fibrillation (HCC)   Turner syndrome   Obesity   Chronic diastolic CHF (congestive heart failure) (HCC)   Moderate aortic insufficiency  Diagnostic Studies/Procedures    1. DCCV as detailed below _____________    History of Present Illness     [name] is a 62 y.o. female with history of paroxysmal atrial fib, Turner's syndrome, chronic bilateral LE edema since childhood, aortic insufficiency, CAD, chronic diastolic CHF, OSA, HTN, HLD, hypothyroidism, chronic appearing hyponatremia stutter who presents for follow-up. Workup earlier this year showed progression to severe AI. 2D echo 05/05/17 showed EF 55-60%, grade 1 DD, severe AI, moderate LAE. TEE 06/14/17 showed EF 50-55%, severe AI, mild MR. The procedure was aborted early due to airway concerns and SVT which required cardioversion. There was also question of a small - moderate sized perimembranous VSD. Cardiac CT overread also showed small pulm nodule in LUL statistically likely benign (radiology recommended f/u CT 12 months if high risk, no f/u if low risk). Calcium score 896 (99th percentile), poor quality coronary study due to AI, "Ascending Aortic root 3.5 cm with abnormal appearing arch which is elongated and horizontal with 'pseudo coarctation' tapering from 19 mm to 15 mm in it's distal portion before descending." No perimembranous VSD was noted. [facility] severe AI, nonobstructive CAD (50-60% prox-mid Cx, 40% prox-mid LAD, 30% prox RCA), normal PA pressures, no L-R shunt, LVEDP 18mmHg, EF 50%. She saw Dr. Laneta SimmersBartle 08/09/17 who felt her AI halftime pressures still  represented moderate AI and did not think there was a clear indication for surgery given relatively asymptomatic nature.   Hospital Course    She was recently admitted to the hospital 08/2017 with rapid atrial fibrillation. She initially was treated with cardizem but then transitioned back to home dose of metoprolol. She was also found to be hyperthyroid so levothyroxine was adjusted. Lasix and spironolactone were held due to hypotension, and Lasix was resumed PRN at DC. Per phone notes, due to volume overload, Lasix was restarted scheduled along with spironolactone. She was started on amiodarone 400mg  BID and set up for OP DCCV on 09/15/17.   After cardioversion she was in sinus rhythm and PACs with some atrial bigeminy. Her heart rate was in the 60s.  However, she then developed sinus bradycardia to the 30s. She was hypotensive to the 60s over 40s and required IV fluid and pushes of phenylephrine. Her heart rate improved with calcium chloride 1 g IV x2. She was then started on a calcium gluconate drip and admitted to the ICU. Her amiodarone was reduced. Metoprolol was held. This morning she is feeling much better and is hemodynamically stable. Dr. Eldridge DaceVaranasi has seen and examined the patient today and feels she is stable for discharge. Her labs do reveal some AKI and elevated LFTs but this is felt related to acute event and can be trended in follow-up. He felt BNP was stable compared to prior. Aside from above changes, Dr. Eldridge DaceVaranasi did not suggest any other adjustments. Further monitoring of organ systems while on amiodarone will be at discretion of primary cardiologist. She will need follow-up of her hypothyroidism while on this medicine and would also suggest  recheck of CMET when she returns for follow-up. Her K was 5.1 but this was likely hemolyzed per report (other K levels have been stable). _____________  Discharge Vitals Blood pressure (!) 104/59, pulse 65, temperature (!) 96.3 F (35.7 C),  temperature source Axillary, resp. rate (!) 21, height 4\' 8"  (1.422 m), weight 63.5 kg, SpO2 100 %.  Filed Weights   09/15/17 1326 09/16/17 0500  Weight: 62.6 kg 63.5 kg    Labs & Radiologic Studies    CBC Recent Labs    09/13/17 1642 09/16/17 0445  WBC 11.4* 11.9*  HGB 14.5 15.8*  HCT 46.2 48.8*  MCV 103* 102.1*  PLT 321 284   Basic Metabolic Panel Recent Labs    16/10/96 1642 09/16/17 0445  NA 139 134*  K 3.9 5.1  CL 94* 97*  CO2 27 26  GLUCOSE 116* 89  BUN 22 29*  CREATININE 0.93 1.14*  CALCIUM 9.1 12.9*  MG  --  2.1   Liver Function Tests Recent Labs    09/16/17 0445  AST 181*  ALT 219*  ALKPHOS 130*  BILITOT 3.0*  PROT 4.9*  ALBUMIN 3.1*   Thyroid Function Tests Recent Labs    09/15/17 1724  TSH 8.274*   _____________  Dg Chest 2 View  Result Date: 09/10/2017 CLINICAL DATA:  Dyspnea for 10 days. EXAM: CHEST - 2 VIEW COMPARISON:  08/16/2017. FINDINGS: Cardiac silhouette is mildly enlarged. No mediastinal or hilar masses. There are prominent bronchovascular markings most evident in the lung bases associated with linear/reticular type opacities. Remainder of the lungs is clear. No pneumothorax. Skeletal structures are demineralized but grossly intact. IMPRESSION: 1. Bilateral lung base opacities associated with prominent bronchovascular markings. Opacities may all be due to atelectasis. Consider infection if there are consistent clinical findings. 2. Mild cardiomegaly.  No convincing pulmonary edema/CHF. Electronically Signed   By: Amie Portland M.D.   On: 09/10/2017 15:39   Dg Esophagus  Result Date: 08/18/2017 CLINICAL DATA:  Gagging episode.  History of esophageal dilatation EXAM: ESOPHOGRAM/BARIUM SWALLOW TECHNIQUE: Single contrast examination was performed using  thin barium. FLUOROSCOPY TIME:  Fluoroscopy Time:  Minute 54 second Radiation Exposure Index (if provided by the fluoroscopic device): Number of Acquired Spot Images: 0 COMPARISON:  10/13/2014  FINDINGS: Limited study due to limited mobility. Study performed with the patient in the left posterior oblique position. Mild diffuse esophageal dilatation. Mild decrease in esophageal motility. No aspiration identified. Barium tablet was administered and did not pass into the stomach. Despite multiple sips of water and barium, the tablet not pass through the GE junction. This area is obscured by the gastric fundus on the esophagram images. This is the same area where the barium tablet would not pass on the prior study. This is presumably due to a benign stricture which has been dilated in the past IMPRESSION: Limited study Mild esophageal dysmotility and dilatation Incompletely evaluated stricture distal esophagus. Barium tablet did not pass into the stomach. Electronically Signed   By: Marlan Palau M.D.   On: 08/18/2017 15:11   Disposition   Pt is being discharged home today in good condition.  Follow-up Plans & Appointments    Follow-up Information    Laurann Montana, PA-C Follow up.   Specialties:  Cardiology, Radiology Why:  Your follow-up appointment has been rescheduled to a closer appointment, on 9/23 at 11am. Please arrive 15 minutes prior to appointment to check in. Contact information: [address] [place] [address] [phone]  Discharge Instructions    Diet - low sodium heart healthy   Complete by:  As directed    Discharge instructions   Complete by:  As directed    Your metoprolol has been stopped.  Your amiodarone dose was reduced to a new lower tablet size (200mg ) only to be taken once daily.   Increase activity slowly   Complete by:  As directed       Discharge Medications   Allergies as of 09/16/2017      Reactions   Cephalexin Rash, Other (See Comments)   Prefers not to have ? Psoriasis skin condition   Latex Rash      Medication List    STOP taking these medications   metoprolol tartrate 25 MG tablet Commonly  known as:  LOPRESSOR     TAKE these medications   acetaminophen 500 MG tablet Commonly known as:  TYLENOL Take 1,000 mg by mouth every 6 (six) hours as needed for moderate pain or headache.   amiodarone 200 MG tablet Commonly known as:  PACERONE Take 1 tablet (200 mg total) by mouth daily. What changed:    medication strength  how much to take  when to take this   atorvastatin 20 MG tablet Commonly known as:  LIPITOR Take 1 tablet (20 mg total) by mouth daily.   cholecalciferol 1000 units tablet Commonly known as:  VITAMIN D Take 1,000 Units by mouth daily.   ELIQUIS 5 MG Tabs tablet Generic drug:  apixaban TAKE 1 TABLET BY MOUTH TWICE DAILY What changed:  how much to take   famotidine 40 MG tablet Commonly known as:  PEPCID Take 40 mg by mouth 2 (two) times daily.   furosemide 40 MG tablet Commonly known as:  LASIX Take 1 tablet (40 mg total) by mouth 2 (two) times daily.   levothyroxine 75 MCG tablet Commonly known as:  SYNTHROID, LEVOTHROID Take 1 tablet (75 mcg total) by mouth daily before breakfast.   potassium chloride 10 MEQ tablet Commonly known as:  K-DUR,KLOR-CON Take 1 tablet (10 mEq total) by mouth 2 (two) times daily.   spironolactone 25 MG tablet Commonly known as:  ALDACTONE Take one half tablet (12.5 mg) daily What changed:    how much to take  how to take this  when to take this  additional instructions   traMADol 50 MG tablet Commonly known as:  ULTRAM Take 50 mg by mouth every 6 (six) hours as needed for moderate pain.        Allergies:  Allergies  Allergen Reactions  . Cephalexin Rash and Other (See Comments)    Prefers not to have ? Psoriasis skin condition  . Latex Rash     Outstanding Labs/Studies   Will plan to get CMET as OP  Duration of Discharge Encounter   Greater than 30 minutes including physician time.  Signed, Tacey Ruiz Dunn PA-C 09/16/2017, 11:24 AM   I have examined the patient and reviewed  assessment and plan and discussed with patient.  Agree with above as stated.  Bradycardia has not caused sx.  She denied dizziness or syncope.  COntinue lower dose Amio and holding metoprolol.    Will follow electrolytes as an outpatient.   Lance Muss

## 2017-09-16 NOTE — Progress Notes (Signed)
Progress Note  Patient Name: [name] Date of Encounter: 09/16/2017  Primary Cardiologist: Kristeen MissPhilip Nahser, MD   Subjective   Feels better  Inpatient Medications    Scheduled Meds: . amiodarone  200 mg Oral Daily  . apixaban  5 mg Oral BID  . atorvastatin  20 mg Oral Daily  . cholecalciferol  1,000 Units Oral Daily  . famotidine  40 mg Oral BID  . levothyroxine  75 mcg Oral QAC breakfast   Continuous Infusions: . sodium chloride    . calcium gluconate infusion(930 mg elemental calcium/L) 1 mg elemental calcium/kg/hr (09/16/17 0747)   PRN Meds: acetaminophen, acetaminophen, acetaminophen, ondansetron (ZOFRAN) IV, traMADol   Vital Signs    Vitals:   09/16/17 0500 09/16/17 0600 09/16/17 0753 09/16/17 0830  BP: (!) 110/54 (!) 104/59    Pulse: (!) 57 (!) 57 65   Resp: (!) 25 (!) 22 (!) 21   Temp:    (!) 96.3 F (35.7 C)  TempSrc:    Axillary  SpO2: 95% 98% 100%   Weight: 63.5 kg     Height:        Intake/Output Summary (Last 24 hours) at 09/16/2017 1027 Last data filed at 09/16/2017 0500 Gross per 24 hour  Intake 1490.6 ml  Output -  Net 1490.6 ml   Filed Weights   09/15/17 1326 09/16/17 0500  Weight: 62.6 kg 63.5 kg    Telemetry    NSR, PACs, PVCs - Personally Reviewed  ECG      Physical Exam   GEN: No acute distress.   Neck: No JVD Cardiac: RRR, no murmurs, rubs, or gallops.  Respiratory: Clear to auscultation bilaterally. GI: Soft, nontender, non-distended  MS: No edema; No deformity. Neuro:  Nonfocal  Psych: Normal affect   Labs    Chemistry Recent Labs  Lab 09/10/17 1535 09/13/17 1642 09/16/17 0445  NA 133* 139 134*  K 3.7 3.9 5.1  CL 95* 94* 97*  CO2 30 27 26   GLUCOSE 124* 116* 89  BUN 32* 22 29*  CREATININE 1.08 0.93 1.14*  CALCIUM 8.9 9.1 12.9*  PROT  --   --  4.9*  ALBUMIN  --   --  3.1*  AST  --   --  181*  ALT  --   --  219*  ALKPHOS  --   --  130*  BILITOT  --   --  3.0*  GFRNONAA  --  66 50*  GFRAA  --  76  58*  ANIONGAP  --   --  11     Hematology Recent Labs  Lab 09/13/17 1642 09/16/17 0445  WBC 11.4* 11.9*  RBC 4.47 4.78  HGB 14.5 15.8*  HCT 46.2 48.8*  MCV 103* 102.1*  MCH 32.4 33.1  MCHC 31.4* 32.4  RDW 13.9 13.9  PLT 321 284    Cardiac EnzymesNo results for input(s): TROPONINI in the last 168 hours. No results for input(s): TROPIPOC in the last 168 hours.   BNP Recent Labs  Lab 09/10/17 1535 09/15/17 1724  BNP  --  3,020.7*  PROBNP 3,174.0*  --      DDimer No results for input(s): DDIMER in the last 168 hours.   Radiology    No results found.  Cardiac Studies     Patient Profile     62 y.o. female s/p cardioversion  Assessment & Plan    HR stable.  COntinue lower dose of Amio.  Hold metoprolol for now.  Eliquis  for stroke prevention. Discharge later today.  For questions or updates, please contact [facility] Please consult [email or web address] for contact info under        Signed, Lance Muss, MD  09/16/2017, 10:27 AM

## 2017-09-16 NOTE — Progress Notes (Signed)
Pt is d/c and off the unit at this time.

## 2017-09-26 NOTE — Progress Notes (Deleted)
Cardiology Office Note    Date:  09/26/2017  ID:  [name], DOB 1955/01/12, MRN [id number] PCP:  Tracey HarriesBouska, David, MD  Cardiologist:  Kristeen MissPhilip Nahser, MD   Chief Complaint: f/u bradycardia  History of Present Illness:  Donna DeeLori M Dean is a 62 y.o. female with history of paroxysmal atrial fib, Turner's syndrome, chronic bilateral LE edema since childhood, aortic insufficiency, CAD, chronic diastolic CHF, OSA, HTN, HLD, hypothyroidism, chronic appearing hyponatremia stutter who presents for post-hospital follow-up. Workup earlier this year showed progression to severe AI. 2D echo 05/05/17 showed EF 55-60%, grade 1 DD, severe AI, moderate LAE. TEE 06/14/17 showed EF 50-55%, severe AI, mild MR. The procedure was aborted early due to airway concerns and SVT which required cardioversion. There was also question of a small - moderate sized perimembranous VSD. Cardiac CT overread also showed small pulm nodule in LUL statistically likely benign (radiology recommended f/u CT 12 months if high risk, no f/u if low risk). Calcium score 896 (99th percentile), poor quality coronary study due to AI, "Ascending Aortic root 3.5 cm with abnormal appearing arch which is elongated and horizontal with 'pseudo coarctation' tapering from 19 mm to 15 mm in it's distal portion before descending." No perimembranous VSD was noted. [facility], nonobstructive CAD (50-60% prox-mid Cx, 40% prox-mid LAD, 30% prox RCA), normal PA pressures, no L-R shunt, LVEDP 18mmHg, EF 50%. She saw Dr. Laneta SimmersBartle 08/09/17 who felt her AI halftime pressures still represented moderate AI and did not think there was a clear indication for surgery given relatively asymptomatic nature. She was recently admitted to the hospital 08/2017 with rapid atrial fibrillation. She initially was treated with cardizem but then transitioned back to home dose of metoprolol. She was also found to be hyperthyroid so levothyroxine was adjusted. Lasix and  spironolactone were held due to hypotension, and Lasix was resumed PRN at DC. Per phone notes, due to volume overload, Lasix was restarted scheduled along with spironolactone. She was started on amiodarone 400mg  BID and set up for OP DCCV on 09/15/17. After cardioversion she was in sinus rhythm and PACs with some atrial bigeminy.Her heart rate was in the 60s. However, she then developed sinus bradycardia to the 30s. She was hypotensive to the 60s over 40s and required IV fluid and pushes of phenylephrine. Her heart rate improved with calcium chloride 1 g IV x2. She was then started on a calcium gluconate drip and admitted to the ICU. Her amiodarone was reduced and beta blocker was discontinued. Her BNP was elevated but Dr. Eldridge DaceVaranasi felt this was consistent with prior so diuretics were not adjusted. She also had AKI and elevated LFTs on labs, with recommendation to f/u as OP.  TSH was also now mildly elevated.  Repeat thyroid function Cr LFTs amio plan  Paroxysmal atrial fib with recent bradycardia Chronic diastolic CHF Aortic insufficiency AKI Elevated LFTs CAD    Past Medical History:  Diagnosis Date  . Adiposity 06/04/2011  . Adult hypothyroidism 06/04/2011  . Aortic insufficiency    a. severe by echo, TEE 2019.  [facility] Kitchen. 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 Bradycardia    a. 09/2017 -> required cessation of metoprolol, decrease in amiodarone.  [facility] Kitchen. CAD (coronary artery disease)    a.  [facility] severe AI, nonobstructive CAD (50-60% prox-mid Cx, 40% prox-mid LAD, 30% prox RCA), normal PA pressures, no L-R shunt, LVEDP 18mmHg, EF 50%..  . Cervix abnormality 1993   MILD ATYPICAL ADENOMATOUS  HYPERPLASIA OF CERVIX  . Chronic diastolic CHF (congestive heart failure) (HCC) 08/29/2014  . Chronic respiratory failure with hypercapnia (HCC) 07/06/2012   Followed in Pulmonary clinic/ [facility]  - 05/2012 > 2lpm started at [facility] with HCO3 33-35 on bmets c/w  hypercarbia. - PSS 08/29/12 Severe obstructive sleep apnea/hypopnea syndrome, with an AHI of 79 > see OSA - Placed on 24h 02 3lpm at d/c 09/04/14 - stopped 12/2014     . Dermatitis 05-26-12   all over  since 9'13  . Difficult intubation    will plan on spinal anesthesia"small mouth/airway"  . Dyspnea   . Edema of both legs    a. right greater than left, due to Turner's syndrome.  . Gagging episode   . H/O total hip arthroplasty 06/04/2011  . Hyperglycemia 12/23/2011  . Hypertension   . Hypothyroidism   . LAD (lymphadenopathy), mediastinal 08/30/2014  . Obesity 09/16/2014  . Orbital floor (blow-out) closed fracture (HCC) 08/17/2011  . OSA (obstructive sleep apnea) 09/10/2012   NPSG 08/29/12:  Severe obstructive sleep apnea/hypopnea syndrome, with an AHI of 79  06/2016 Bipap 21/17  . Osteoarthritis 10/16/2010   Overview:  Dr. Charlann Boxer (hips and knees)   . PAF (paroxysmal atrial fibrillation) (HCC)   . Periorbital edema   . 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 Severe aortic insufficiency   . Speech disorder    occ. "stutter"  . Supraventricular tachycardia (HCC) 08/27/2014  . Turner syndrome 06/04/2011  . Turner's syndrome   . VSD (ventricular septal defect)    a. ? by TEE 06/2017, not seen on cardiac CT and no shunt by [facility].    Past Surgical History:  Procedure Laterality Date  . CARDIOVERSION  06/14/2017   Procedure: CARDIOVERSION;  Surgeon: Elease Hashimoto Deloris Ping, MD;  Location: [facility] ENDOSCOPY;  Service: Cardiovascular;;  . CARDIOVERSION N/A 09/15/2017   Procedure: CARDIOVERSION;  Surgeon: Chilton Si, MD;  Location: [facility] ENDOSCOPY;  Service: Cardiovascular;  Laterality: N/A;  . CERVICAL CONE BIOPSY  1993  . COLPOSCOPY    . DILATION AND CURETTAGE OF UTERUS  1993   CONE BIOPSY  . HERNIA REPAIR    . JOINT REPLACEMENT     hip replacement  . RIGHT/LEFT HEART CATH AND CORONARY ANGIOGRAPHY N/A 07/20/2017   Procedure: RIGHT/LEFT HEART CATH AND CORONARY ANGIOGRAPHY;  Surgeon: Lyn Records, MD;  Location: [facility]  INVASIVE CV LAB;  Service: Cardiovascular;  Laterality: N/A;  . TEE WITHOUT CARDIOVERSION N/A 06/14/2017   Procedure: TRANSESOPHAGEAL ECHOCARDIOGRAM (TEE);  Surgeon: Elease Hashimoto Deloris Ping, MD;  Location: [facility] ENDOSCOPY;  Service: Cardiovascular;  Laterality: N/A;  . TOTAL HIP ARTHROPLASTY     LEFT  . TOTAL KNEE ARTHROPLASTY Right 05/31/2012   Procedure: RIGHT TOTAL KNEE ARTHROPLASTY;  Surgeon: Shelda Pal, MD;  Location: [facility] ORS;  Service: Orthopedics;  Laterality: Right;  . ULTRASOUND GUIDANCE FOR VASCULAR ACCESS  07/20/2017   Procedure: Ultrasound Guidance For Vascular Access;  Surgeon: Lyn Records, MD;  Location: [facility] INVASIVE CV LAB;  Service: Cardiovascular;;  . US ECHOCARDIOGRAPHY  06/20/2009   EF 55-60%    Current Medications: No outpatient medications have been marked as taking for the 2017/09/28 encounter (Appointment) with Laurann Montana, PA-C.   ***   Allergies:   Metoprolol; Cephalexin; and Latex   Social History   Socioeconomic History  . Marital status: Single    Spouse name: Not on file  . Number of children: Not on file  . Years of education: Not on file  .  Highest education level: Not on file  Occupational History  . Occupation: N/A    Employer: NOT EMPLOYED  Social Needs  . Financial resource strain: Not on file  . Food insecurity:    Worry: Not on file    Inability: Not on file  . Transportation needs:    Medical: Not on file    Non-medical: Not on file  Tobacco Use  . Smoking status: Never Smoker  . Smokeless tobacco: Never Used  Substance and Sexual Activity  . Alcohol use: No  . Drug use: No  . Sexual activity: Never    Birth control/protection: Post-menopausal  Lifestyle  . Physical activity:    Days per week: Not on file    Minutes per session: Not on file  . Stress: Not on file  Relationships  . Social connections:    Talks on phone: Not on file    Gets together: Not on file    Attends religious service: Not on file    Active member of club or  organization: Not on file    Attends meetings of clubs or organizations: Not on file    Relationship status: Not on file  Other Topics Concern  . Not on file  Social History Narrative  . Not on file     Family History:  The patient's ***family history includes Atrial fibrillation in her mother; Breast cancer in her cousin; Cancer in her mother; Diabetes in her maternal uncle; Hypertension in her mother.  ROS:   Please see the history of present illness. Otherwise, review of systems is positive for ***.  All other systems are reviewed and otherwise negative.    PHYSICAL EXAM:   VS:  There were no vitals taken for this visit.  BMI: There is no height or weight on file to calculate BMI. GEN: Well nourished, well developed, in no acute distress HEENT: normocephalic, atraumatic Neck: no JVD, carotid bruits, or masses Cardiac: ***RRR; no murmurs, rubs, or gallops, no edema  Respiratory:  clear to auscultation bilaterally, normal work of breathing GI: soft, nontender, nondistended, + BS MS: no deformity or atrophy Skin: warm and dry, no rash Neuro:  Alert and Oriented x 3, Strength and sensation are intact, follows commands Psych: euthymic mood, full affect  Wt Readings from Last 3 Encounters:  09/16/17 139 lb 15.9 oz (63.5 kg)  09/13/17 139 lb (63 kg)  09/10/17 139 lb 9.6 oz (63.3 kg)      Studies/Labs Reviewed:   EKG:  EKG was ordered today and personally reviewed by me and demonstrates *** EKG was not ordered today.***  Recent Labs: 09/10/2017: Pro B Natriuretic peptide (BNP) 3,174.0 09/15/2017: B Natriuretic Peptide 3,020.7; TSH 8.274 09/16/2017: ALT 219; BUN 29; Creatinine, Ser 1.14; Hemoglobin 15.8; Magnesium 2.1; Platelets 284; Potassium 5.1; Sodium 134   Lipid Panel    Component Value Date/Time   CHOL 119 05/05/2017 1002   TRIG 80 05/05/2017 1002   HDL 49 05/05/2017 1002   CHOLHDL 2.4 05/05/2017 1002   CHOLHDL 6.3 08/29/2014 0800   VLDL 33 08/29/2014 0800    LDLCALC 54 05/05/2017 1002    Additional studies/ records that were reviewed today include: Summarized above.***    ASSESSMENT & PLAN:   1. ***  Disposition: F/u with ***   Medication Adjustments/Labs and Tests Ordered: Current medicines are reviewed at length with the patient today.  Concerns regarding medicines are outlined above. Medication changes, Labs and Tests ordered today are summarized above and listed in the Patient  Instructions accessible in Encounters.   Signed, Laurann Montana, PA-C  09/26/2017 9:37 AM    [facility] [address] [place], [place], [place]  [address] Phone: [phone]; Fax: [phone]

## 2017-09-27 ENCOUNTER — Encounter (HOSPITAL_COMMUNITY): Payer: Self-pay | Admitting: Neurology

## 2017-09-27 ENCOUNTER — Inpatient Hospital Stay (HOSPITAL_COMMUNITY)
Admission: EM | Admit: 2017-09-27 | Discharge: 2017-10-05 | DRG: 870 | Disposition: E | Payer: Medicaid Other | Attending: Pulmonary Disease | Admitting: Pulmonary Disease

## 2017-09-27 ENCOUNTER — Emergency Department (HOSPITAL_COMMUNITY): Payer: Medicaid Other

## 2017-09-27 ENCOUNTER — Inpatient Hospital Stay (HOSPITAL_COMMUNITY): Payer: Medicaid Other

## 2017-09-27 ENCOUNTER — Ambulatory Visit: Payer: Medicaid Other | Admitting: Physician Assistant

## 2017-09-27 ENCOUNTER — Inpatient Hospital Stay: Payer: Self-pay

## 2017-09-27 DIAGNOSIS — I5033 Acute on chronic diastolic (congestive) heart failure: Secondary | ICD-10-CM | POA: Diagnosis present

## 2017-09-27 DIAGNOSIS — N179 Acute kidney failure, unspecified: Secondary | ICD-10-CM | POA: Diagnosis present

## 2017-09-27 DIAGNOSIS — L8990 Pressure ulcer of unspecified site, unspecified stage: Secondary | ICD-10-CM

## 2017-09-27 DIAGNOSIS — J9612 Chronic respiratory failure with hypercapnia: Secondary | ICD-10-CM | POA: Diagnosis present

## 2017-09-27 DIAGNOSIS — Z9911 Dependence on respirator [ventilator] status: Secondary | ICD-10-CM | POA: Diagnosis not present

## 2017-09-27 DIAGNOSIS — I110 Hypertensive heart disease with heart failure: Secondary | ICD-10-CM | POA: Diagnosis present

## 2017-09-27 DIAGNOSIS — N390 Urinary tract infection, site not specified: Secondary | ICD-10-CM | POA: Diagnosis present

## 2017-09-27 DIAGNOSIS — D65 Disseminated intravascular coagulation [defibrination syndrome]: Secondary | ICD-10-CM | POA: Diagnosis not present

## 2017-09-27 DIAGNOSIS — I351 Nonrheumatic aortic (valve) insufficiency: Secondary | ICD-10-CM | POA: Diagnosis present

## 2017-09-27 DIAGNOSIS — N170 Acute kidney failure with tubular necrosis: Secondary | ICD-10-CM | POA: Diagnosis not present

## 2017-09-27 DIAGNOSIS — I4892 Unspecified atrial flutter: Secondary | ICD-10-CM | POA: Diagnosis not present

## 2017-09-27 DIAGNOSIS — I482 Chronic atrial fibrillation: Secondary | ICD-10-CM | POA: Diagnosis present

## 2017-09-27 DIAGNOSIS — J9601 Acute respiratory failure with hypoxia: Secondary | ICD-10-CM

## 2017-09-27 DIAGNOSIS — Z515 Encounter for palliative care: Secondary | ICD-10-CM

## 2017-09-27 DIAGNOSIS — M1990 Unspecified osteoarthritis, unspecified site: Secondary | ICD-10-CM | POA: Diagnosis present

## 2017-09-27 DIAGNOSIS — E872 Acidosis, unspecified: Secondary | ICD-10-CM

## 2017-09-27 DIAGNOSIS — D6832 Hemorrhagic disorder due to extrinsic circulating anticoagulants: Secondary | ICD-10-CM | POA: Diagnosis not present

## 2017-09-27 DIAGNOSIS — Q210 Ventricular septal defect: Secondary | ICD-10-CM | POA: Diagnosis not present

## 2017-09-27 DIAGNOSIS — E039 Hypothyroidism, unspecified: Secondary | ICD-10-CM | POA: Diagnosis present

## 2017-09-27 DIAGNOSIS — G4733 Obstructive sleep apnea (adult) (pediatric): Secondary | ICD-10-CM | POA: Diagnosis present

## 2017-09-27 DIAGNOSIS — I959 Hypotension, unspecified: Secondary | ICD-10-CM | POA: Diagnosis not present

## 2017-09-27 DIAGNOSIS — R0602 Shortness of breath: Secondary | ICD-10-CM | POA: Diagnosis present

## 2017-09-27 DIAGNOSIS — E46 Unspecified protein-calorie malnutrition: Secondary | ICD-10-CM | POA: Diagnosis present

## 2017-09-27 DIAGNOSIS — Z803 Family history of malignant neoplasm of breast: Secondary | ICD-10-CM

## 2017-09-27 DIAGNOSIS — I429 Cardiomyopathy, unspecified: Secondary | ICD-10-CM | POA: Diagnosis present

## 2017-09-27 DIAGNOSIS — I2510 Atherosclerotic heart disease of native coronary artery without angina pectoris: Secondary | ICD-10-CM | POA: Diagnosis present

## 2017-09-27 DIAGNOSIS — Z91040 Latex allergy status: Secondary | ICD-10-CM

## 2017-09-27 DIAGNOSIS — Z96651 Presence of right artificial knee joint: Secondary | ICD-10-CM | POA: Diagnosis present

## 2017-09-27 DIAGNOSIS — I472 Ventricular tachycardia: Secondary | ICD-10-CM | POA: Diagnosis not present

## 2017-09-27 DIAGNOSIS — Q969 Turner's syndrome, unspecified: Secondary | ICD-10-CM

## 2017-09-27 DIAGNOSIS — Z6830 Body mass index (BMI) 30.0-30.9, adult: Secondary | ICD-10-CM

## 2017-09-27 DIAGNOSIS — R000 Tachycardia, unspecified: Secondary | ICD-10-CM

## 2017-09-27 DIAGNOSIS — Z79890 Hormone replacement therapy (postmenopausal): Secondary | ICD-10-CM

## 2017-09-27 DIAGNOSIS — Z452 Encounter for adjustment and management of vascular access device: Secondary | ICD-10-CM | POA: Diagnosis not present

## 2017-09-27 DIAGNOSIS — D751 Secondary polycythemia: Secondary | ICD-10-CM | POA: Diagnosis present

## 2017-09-27 DIAGNOSIS — Z833 Family history of diabetes mellitus: Secondary | ICD-10-CM

## 2017-09-27 DIAGNOSIS — E869 Volume depletion, unspecified: Secondary | ICD-10-CM | POA: Diagnosis present

## 2017-09-27 DIAGNOSIS — I471 Supraventricular tachycardia: Secondary | ICD-10-CM | POA: Diagnosis present

## 2017-09-27 DIAGNOSIS — I480 Paroxysmal atrial fibrillation: Secondary | ICD-10-CM | POA: Diagnosis present

## 2017-09-27 DIAGNOSIS — Z881 Allergy status to other antibiotic agents status: Secondary | ICD-10-CM

## 2017-09-27 DIAGNOSIS — R570 Cardiogenic shock: Secondary | ICD-10-CM | POA: Diagnosis not present

## 2017-09-27 DIAGNOSIS — Z888 Allergy status to other drugs, medicaments and biological substances status: Secondary | ICD-10-CM

## 2017-09-27 DIAGNOSIS — E785 Hyperlipidemia, unspecified: Secondary | ICD-10-CM | POA: Diagnosis present

## 2017-09-27 DIAGNOSIS — Z7189 Other specified counseling: Secondary | ICD-10-CM

## 2017-09-27 DIAGNOSIS — I4891 Unspecified atrial fibrillation: Secondary | ICD-10-CM | POA: Diagnosis not present

## 2017-09-27 DIAGNOSIS — Z8249 Family history of ischemic heart disease and other diseases of the circulatory system: Secondary | ICD-10-CM

## 2017-09-27 DIAGNOSIS — K7200 Acute and subacute hepatic failure without coma: Secondary | ICD-10-CM | POA: Diagnosis not present

## 2017-09-27 DIAGNOSIS — J9600 Acute respiratory failure, unspecified whether with hypoxia or hypercapnia: Secondary | ICD-10-CM

## 2017-09-27 DIAGNOSIS — E875 Hyperkalemia: Secondary | ICD-10-CM | POA: Diagnosis present

## 2017-09-27 DIAGNOSIS — R34 Anuria and oliguria: Secondary | ICD-10-CM | POA: Diagnosis not present

## 2017-09-27 DIAGNOSIS — I481 Persistent atrial fibrillation: Secondary | ICD-10-CM | POA: Diagnosis present

## 2017-09-27 DIAGNOSIS — Z0189 Encounter for other specified special examinations: Secondary | ICD-10-CM

## 2017-09-27 DIAGNOSIS — G9341 Metabolic encephalopathy: Secondary | ICD-10-CM | POA: Diagnosis not present

## 2017-09-27 DIAGNOSIS — J9690 Respiratory failure, unspecified, unspecified whether with hypoxia or hypercapnia: Secondary | ICD-10-CM

## 2017-09-27 DIAGNOSIS — Z7901 Long term (current) use of anticoagulants: Secondary | ICD-10-CM

## 2017-09-27 DIAGNOSIS — R579 Shock, unspecified: Secondary | ICD-10-CM

## 2017-09-27 DIAGNOSIS — A419 Sepsis, unspecified organism: Principal | ICD-10-CM | POA: Diagnosis present

## 2017-09-27 DIAGNOSIS — Z96641 Presence of right artificial hip joint: Secondary | ICD-10-CM | POA: Diagnosis present

## 2017-09-27 DIAGNOSIS — Z808 Family history of malignant neoplasm of other organs or systems: Secondary | ICD-10-CM

## 2017-09-27 LAB — COMPREHENSIVE METABOLIC PANEL
ALT: 223 U/L — AB (ref 0–44)
AST: 216 U/L — AB (ref 15–41)
Albumin: 3.4 g/dL — ABNORMAL LOW (ref 3.5–5.0)
Alkaline Phosphatase: 105 U/L (ref 38–126)
Anion gap: 22 — ABNORMAL HIGH (ref 5–15)
BUN: 27 mg/dL — ABNORMAL HIGH (ref 8–23)
CHLORIDE: 93 mmol/L — AB (ref 98–111)
CO2: 17 mmol/L — AB (ref 22–32)
CREATININE: 1.98 mg/dL — AB (ref 0.44–1.00)
Calcium: 9.3 mg/dL (ref 8.9–10.3)
GFR calc non Af Amer: 26 mL/min — ABNORMAL LOW (ref >60)
GFR, EST AFRICAN AMERICAN: 30 mL/min — AB (ref >60)
Glucose, Bld: 194 mg/dL — ABNORMAL HIGH (ref 70–99)
Potassium: 5.9 mmol/L — ABNORMAL HIGH (ref 3.5–5.1)
Sodium: 132 mmol/L — ABNORMAL LOW (ref 135–145)
Total Bilirubin: 3.5 mg/dL — ABNORMAL HIGH (ref 0.3–1.2)
Total Protein: 5.8 g/dL — ABNORMAL LOW (ref 6.5–8.1)

## 2017-09-27 LAB — LACTIC ACID, PLASMA: Lactic Acid, Venous: 10.5 mmol/L (ref 0.5–1.9)

## 2017-09-27 LAB — URINALYSIS, ROUTINE W REFLEX MICROSCOPIC
Bilirubin Urine: NEGATIVE
Glucose, UA: 150 mg/dL — AB
Ketones, ur: 5 mg/dL — AB
Nitrite: NEGATIVE
PROTEIN: 100 mg/dL — AB
Specific Gravity, Urine: 1.021 (ref 1.005–1.030)
pH: 5 (ref 5.0–8.0)

## 2017-09-27 LAB — I-STAT CHEM 8, ED
BUN: 34 mg/dL — AB (ref 8–23)
CREATININE: 1.8 mg/dL — AB (ref 0.44–1.00)
Calcium, Ion: 0.99 mmol/L — ABNORMAL LOW (ref 1.15–1.40)
Chloride: 94 mmol/L — ABNORMAL LOW (ref 98–111)
Glucose, Bld: 193 mg/dL — ABNORMAL HIGH (ref 70–99)
HEMATOCRIT: 52 % — AB (ref 36.0–46.0)
HEMOGLOBIN: 17.7 g/dL — AB (ref 12.0–15.0)
POTASSIUM: 5.6 mmol/L — AB (ref 3.5–5.1)
SODIUM: 130 mmol/L — AB (ref 135–145)
TCO2: 22 mmol/L (ref 22–32)

## 2017-09-27 LAB — CBC WITH DIFFERENTIAL/PLATELET
ABS IMMATURE GRANULOCYTES: 0.4 10*3/uL — AB (ref 0.0–0.1)
Basophils Absolute: 0.1 10*3/uL (ref 0.0–0.1)
Basophils Relative: 0 %
EOS ABS: 0 10*3/uL (ref 0.0–0.7)
Eosinophils Relative: 0 %
HEMATOCRIT: 52 % — AB (ref 36.0–46.0)
Hemoglobin: 15.9 g/dL — ABNORMAL HIGH (ref 12.0–15.0)
IMMATURE GRANULOCYTES: 2 %
LYMPHS ABS: 1.1 10*3/uL (ref 0.7–4.0)
Lymphocytes Relative: 6 %
MCH: 32.6 pg (ref 26.0–34.0)
MCHC: 30.6 g/dL (ref 30.0–36.0)
MCV: 106.8 fL — AB (ref 78.0–100.0)
Monocytes Absolute: 2.6 10*3/uL — ABNORMAL HIGH (ref 0.1–1.0)
Monocytes Relative: 14 %
NEUTROS PCT: 78 %
Neutro Abs: 14.8 10*3/uL — ABNORMAL HIGH (ref 1.7–7.7)
Platelets: 325 10*3/uL (ref 150–400)
RBC: 4.87 MIL/uL (ref 3.87–5.11)
RDW: 13.9 % (ref 11.5–15.5)
WBC: 19 10*3/uL — ABNORMAL HIGH (ref 4.0–10.5)

## 2017-09-27 LAB — I-STAT VENOUS BLOOD GAS, ED
Acid-base deficit: 8 mmol/L — ABNORMAL HIGH (ref 0.0–2.0)
BICARBONATE: 21.6 mmol/L (ref 20.0–28.0)
O2 Saturation: 35 %
PCO2 VEN: 57.2 mmHg (ref 44.0–60.0)
PH VEN: 7.184 — AB (ref 7.250–7.430)
TCO2: 23 mmol/L (ref 22–32)
pO2, Ven: 27 mmHg — CL (ref 32.0–45.0)

## 2017-09-27 LAB — MAGNESIUM: MAGNESIUM: 2.6 mg/dL — AB (ref 1.7–2.4)

## 2017-09-27 LAB — POCT I-STAT 3, ART BLOOD GAS (G3+)
Acid-Base Excess: 3 mmol/L — ABNORMAL HIGH (ref 0.0–2.0)
Bicarbonate: 29.5 mmol/L — ABNORMAL HIGH (ref 20.0–28.0)
O2 SAT: 100 %
PCO2 ART: 50.6 mmHg — AB (ref 32.0–48.0)
PH ART: 7.374 (ref 7.350–7.450)
Patient temperature: 98.6
TCO2: 31 mmol/L (ref 22–32)
pO2, Arterial: 282 mmHg — ABNORMAL HIGH (ref 83.0–108.0)

## 2017-09-27 LAB — I-STAT CG4 LACTIC ACID, ED
LACTIC ACID, VENOUS: 10.38 mmol/L — AB (ref 0.5–1.9)
Lactic Acid, Venous: 10.61 mmol/L (ref 0.5–1.9)

## 2017-09-27 LAB — GLUCOSE, CAPILLARY
GLUCOSE-CAPILLARY: 177 mg/dL — AB (ref 70–99)
GLUCOSE-CAPILLARY: 276 mg/dL — AB (ref 70–99)
Glucose-Capillary: 68 mg/dL — ABNORMAL LOW (ref 70–99)
Glucose-Capillary: 70 mg/dL (ref 70–99)
Glucose-Capillary: 140 mg/dL — ABNORMAL HIGH (ref 70–99)

## 2017-09-27 LAB — BASIC METABOLIC PANEL
Anion gap: 22 — ABNORMAL HIGH (ref 5–15)
BUN: 28 mg/dL — ABNORMAL HIGH (ref 8–23)
CHLORIDE: 95 mmol/L — AB (ref 98–111)
CO2: 19 mmol/L — AB (ref 22–32)
Calcium: 8.9 mg/dL (ref 8.9–10.3)
Creatinine, Ser: 2.2 mg/dL — ABNORMAL HIGH (ref 0.44–1.00)
GFR calc non Af Amer: 23 mL/min — ABNORMAL LOW (ref >60)
GFR, EST AFRICAN AMERICAN: 26 mL/min — AB (ref >60)
Glucose, Bld: 191 mg/dL — ABNORMAL HIGH (ref 70–99)
POTASSIUM: 6.8 mmol/L — AB (ref 3.5–5.1)
SODIUM: 136 mmol/L (ref 135–145)

## 2017-09-27 LAB — I-STAT TROPONIN, ED: Troponin i, poc: 2.16 ng/mL (ref 0.00–0.08)

## 2017-09-27 LAB — CREATININE, URINE, RANDOM: Creatinine, Urine: 100.07 mg/dL

## 2017-09-27 LAB — CORTISOL: Cortisol, Plasma: 71.7 ug/dL

## 2017-09-27 LAB — BRAIN NATRIURETIC PEPTIDE: B Natriuretic Peptide: >4500 pg/mL — ABNORMAL HIGH (ref 0.0–100.0)

## 2017-09-27 LAB — SODIUM, URINE, RANDOM: Sodium, Ur: 14 mmol/L

## 2017-09-27 LAB — PROCALCITONIN: PROCALCITONIN: 1.04 ng/mL

## 2017-09-27 LAB — CK: Total CK: 257 U/L — ABNORMAL HIGH (ref 38–234)

## 2017-09-27 LAB — PHOSPHORUS: PHOSPHORUS: 7.5 mg/dL — AB (ref 2.5–4.6)

## 2017-09-27 MED ORDER — HEPARIN (PORCINE) IN NACL 100-0.45 UNIT/ML-% IJ SOLN
700.0000 [IU]/h | INTRAMUSCULAR | Status: DC
  Administered 2017-09-27: 700 [IU]/h via INTRAVENOUS
  Filled 2017-09-27: qty 250

## 2017-09-27 MED ORDER — ACETAMINOPHEN 650 MG RE SUPP: 650.0000 mg | RECTAL | Status: DC | PRN

## 2017-09-27 MED ORDER — FENTANYL CITRATE (PF) 100 MCG/2ML IJ SOLN
100.0000 ug | INTRAMUSCULAR | Status: DC | PRN
  Administered 2017-09-27 – 2017-09-28 (×4): 100 ug via INTRAVENOUS
  Administered 2017-09-29: 25 ug via INTRAVENOUS
  Administered 2017-09-30 – 2017-10-02 (×3): 100 ug via INTRAVENOUS
  Filled 2017-09-27 (×8): qty 2

## 2017-09-27 MED ORDER — LEVOFLOXACIN IN D5W 250 MG/50ML IV SOLN
250.0000 mg | INTRAVENOUS | Status: DC
  Administered 2017-09-27 – 2017-10-02 (×7): 250 mg via INTRAVENOUS
  Filled 2017-09-27 (×6): qty 50

## 2017-09-27 MED ORDER — CHLORHEXIDINE GLUCONATE 0.12% ORAL RINSE (MEDLINE KIT)
15.0000 mL | Freq: Two times a day (BID) | OROMUCOSAL | Status: DC
  Administered 2017-09-27 – 2017-10-02 (×10): 15 mL via OROMUCOSAL

## 2017-09-27 MED ORDER — INSULIN ASPART 100 UNIT/ML IV SOLN
10.0000 [IU] | Freq: Once | INTRAVENOUS | Status: AC
  Administered 2017-09-27: 10 [IU] via INTRAVENOUS

## 2017-09-27 MED ORDER — NOREPINEPHRINE 4 MG/250ML-% IV SOLN
0.0000 ug/min | INTRAVENOUS | Status: DC
  Administered 2017-09-27: 20 ug/min via INTRAVENOUS
  Filled 2017-09-27: qty 250

## 2017-09-27 MED ORDER — MIDAZOLAM HCL 2 MG/2ML IJ SOLN
2.0000 mg | INTRAMUSCULAR | Status: DC | PRN
  Administered 2017-09-27: 2 mg via INTRAVENOUS
  Filled 2017-09-27 (×4): qty 2

## 2017-09-27 MED ORDER — SODIUM CHLORIDE 0.9 % IV SOLN
1.0000 g | Freq: Once | INTRAVENOUS | Status: AC
  Administered 2017-09-27: 1 g via INTRAVENOUS
  Filled 2017-09-27: qty 10

## 2017-09-27 MED ORDER — MIDAZOLAM HCL 2 MG/2ML IJ SOLN
INTRAMUSCULAR | Status: AC
  Administered 2017-09-27: 2 mg via INTRAVENOUS
  Filled 2017-09-27: qty 2

## 2017-09-27 MED ORDER — VANCOMYCIN HCL 10 G IV SOLR
1250.0000 mg | Freq: Once | INTRAVENOUS | Status: AC
  Administered 2017-09-27: 1250 mg via INTRAVENOUS
  Filled 2017-09-27: qty 1250

## 2017-09-27 MED ORDER — PRISMASOL BGK 0/2.5 32-2.5 MEQ/L IV SOLN
INTRAVENOUS | Status: DC
  Administered 2017-09-27 – 2017-09-28 (×2): via INTRAVENOUS_CENTRAL
  Filled 2017-09-27 (×7): qty 5000

## 2017-09-27 MED ORDER — LEVOTHYROXINE SODIUM 100 MCG IV SOLR
37.5000 ug | Freq: Every day | INTRAVENOUS | Status: DC
  Administered 2017-09-28 – 2017-10-01 (×4): 37.5 ug via INTRAVENOUS
  Filled 2017-09-27 (×4): qty 5

## 2017-09-27 MED ORDER — AMIODARONE HCL IN DEXTROSE 360-4.14 MG/200ML-% IV SOLN
30.0000 mg/h | INTRAVENOUS | Status: DC
  Administered 2017-09-27 – 2017-09-30 (×6): 30 mg/h via INTRAVENOUS
  Filled 2017-09-27 (×6): qty 200

## 2017-09-27 MED ORDER — SODIUM CHLORIDE 0.9 % IV BOLUS
500.0000 mL | Freq: Once | INTRAVENOUS | Status: AC
  Administered 2017-09-27: 500 mL via INTRAVENOUS

## 2017-09-27 MED ORDER — DEXTROSE 50 % IV SOLN
25.0000 mL | INTRAVENOUS | Status: AC
  Administered 2017-09-27: 25 mL via INTRAVENOUS
  Filled 2017-09-27: qty 50

## 2017-09-27 MED ORDER — SODIUM CHLORIDE 0.9 % IV SOLN: INTRAVENOUS | Status: DC | PRN

## 2017-09-27 MED ORDER — ONDANSETRON HCL 4 MG/2ML IJ SOLN: 4.0000 mg | Freq: Four times a day (QID) | INTRAMUSCULAR | Status: DC | PRN

## 2017-09-27 MED ORDER — HEPARIN BOLUS VIA INFUSION (CRRT)
1000.0000 [IU] | INTRAVENOUS | Status: DC | PRN
  Filled 2017-09-27 (×5): qty 1000

## 2017-09-27 MED ORDER — AMIODARONE HCL IN DEXTROSE 360-4.14 MG/200ML-% IV SOLN
60.0000 mg/h | INTRAVENOUS | Status: AC
  Administered 2017-09-27: 60 mg/h via INTRAVENOUS
  Filled 2017-09-27: qty 200

## 2017-09-27 MED ORDER — NOREPINEPHRINE 4 MG/250ML-% IV SOLN: 0.0000 ug/min | INTRAVENOUS | Status: DC

## 2017-09-27 MED ORDER — FENTANYL CITRATE (PF) 100 MCG/2ML IJ SOLN
INTRAMUSCULAR | Status: AC
  Administered 2017-09-27: 100 ug via INTRAVENOUS
  Filled 2017-09-27: qty 2

## 2017-09-27 MED ORDER — NOREPINEPHRINE 16 MG/250ML-% IV SOLN
0.0000 ug/min | INTRAVENOUS | Status: DC
  Administered 2017-09-27: 20 ug/min via INTRAVENOUS
  Administered 2017-09-28: 100 ug/min via INTRAVENOUS
  Administered 2017-09-28: 40 ug/min via INTRAVENOUS
  Administered 2017-09-29 (×2): 135 ug/min via INTRAVENOUS
  Administered 2017-09-29: 110 ug/min via INTRAVENOUS
  Administered 2017-09-29: 60 ug/min via INTRAVENOUS
  Administered 2017-09-29: 135 ug/min via INTRAVENOUS
  Administered 2017-09-29: 118 ug/min via INTRAVENOUS
  Administered 2017-09-29: 100 ug/min via INTRAVENOUS
  Administered 2017-09-29: 90 ug/min via INTRAVENOUS
  Administered 2017-09-30 (×2): 35 ug/min via INTRAVENOUS
  Administered 2017-09-30: 50 ug/min via INTRAVENOUS
  Administered 2017-10-01: 32 ug/min via INTRAVENOUS
  Administered 2017-10-01: 38 ug/min via INTRAVENOUS
  Administered 2017-10-02: 20 ug/min via INTRAVENOUS
  Filled 2017-09-27 (×19): qty 250

## 2017-09-27 MED ORDER — SODIUM CHLORIDE 0.9 % IJ SOLN
250.0000 [IU]/h | INTRAMUSCULAR | Status: DC
  Administered 2017-09-27: 250 [IU]/h via INTRAVENOUS_CENTRAL
  Administered 2017-09-29: 700 [IU]/h via INTRAVENOUS_CENTRAL
  Administered 2017-09-29: 650 [IU]/h via INTRAVENOUS_CENTRAL
  Administered 2017-09-30: 950 [IU]/h via INTRAVENOUS_CENTRAL
  Filled 2017-09-27 (×4): qty 2

## 2017-09-27 MED ORDER — SODIUM CHLORIDE 0.9 % IV SOLN
1.0000 g | Freq: Once | INTRAVENOUS | Status: AC
  Administered 2017-09-27: 1 g via INTRAVENOUS
  Filled 2017-09-27: qty 1

## 2017-09-27 MED ORDER — APIXABAN 5 MG PO TABS: 5.0000 mg | ORAL_TABLET | Freq: Two times a day (BID) | ORAL | Status: DC

## 2017-09-27 MED ORDER — PRISMASOL BGK 0/2.5 32-2.5 MEQ/L IV SOLN
INTRAVENOUS | Status: DC
  Administered 2017-09-27: 23:00:00 via INTRAVENOUS_CENTRAL
  Filled 2017-09-27 (×3): qty 5000

## 2017-09-27 MED ORDER — FAMOTIDINE IN NACL 20-0.9 MG/50ML-% IV SOLN
20.0000 mg | INTRAVENOUS | Status: DC
  Administered 2017-09-27 – 2017-10-01 (×5): 20 mg via INTRAVENOUS
  Filled 2017-09-27 (×5): qty 50

## 2017-09-27 MED ORDER — ATORVASTATIN CALCIUM 20 MG PO TABS
20.0000 mg | ORAL_TABLET | Freq: Every day | ORAL | Status: DC
  Administered 2017-09-30 – 2017-10-01 (×2): 20 mg via ORAL
  Filled 2017-09-27 (×3): qty 1

## 2017-09-27 MED ORDER — DEXTROSE 50 % IV SOLN
50.0000 mL | Freq: Once | INTRAVENOUS | Status: AC
  Administered 2017-09-27: 50 mL via INTRAVENOUS
  Filled 2017-09-27: qty 50

## 2017-09-27 MED ORDER — HEPARIN SODIUM (PORCINE) 1000 UNIT/ML DIALYSIS
1000.0000 [IU] | INTRAMUSCULAR | Status: DC | PRN
  Administered 2017-09-28: 3000 [IU] via INTRAVENOUS_CENTRAL
  Filled 2017-09-27 (×2): qty 6
  Filled 2017-09-27 (×2): qty 3

## 2017-09-27 MED ORDER — SODIUM CHLORIDE 0.9 % IV SOLN
INTRAVENOUS | Status: DC
  Administered 2017-09-27 – 2017-10-01 (×4): via INTRAVENOUS

## 2017-09-27 MED ORDER — PRISMASOL BGK 0/2.5 32-2.5 MEQ/L IV SOLN
INTRAVENOUS | Status: DC
  Administered 2017-09-27 – 2017-09-28 (×2): via INTRAVENOUS_CENTRAL
  Filled 2017-09-27 (×3): qty 5000

## 2017-09-27 MED ORDER — FAMOTIDINE IN NACL 20-0.9 MG/50ML-% IV SOLN: 20.0000 mg | INTRAVENOUS | Status: DC

## 2017-09-27 MED ORDER — INSULIN ASPART 100 UNIT/ML ~~LOC~~ SOLN
0.0000 [IU] | SUBCUTANEOUS | Status: DC
  Administered 2017-09-27: 2 [IU] via SUBCUTANEOUS
  Administered 2017-09-27: 7 [IU] via SUBCUTANEOUS
  Administered 2017-09-28: 2 [IU] via SUBCUTANEOUS
  Administered 2017-09-28: 3 [IU] via SUBCUTANEOUS
  Administered 2017-09-28 – 2017-09-29 (×2): 1 [IU] via SUBCUTANEOUS
  Administered 2017-09-29 (×2): 2 [IU] via SUBCUTANEOUS
  Administered 2017-09-29: 1 [IU] via SUBCUTANEOUS
  Administered 2017-09-29: 2 [IU] via SUBCUTANEOUS
  Administered 2017-09-30 – 2017-10-02 (×11): 1 [IU] via SUBCUTANEOUS

## 2017-09-27 MED ORDER — DEXTROSE 50 % IV SOLN
25.0000 mL | INTRAVENOUS | Status: DC | PRN
  Administered 2017-09-27: 25 mL via INTRAVENOUS
  Filled 2017-09-27: qty 50

## 2017-09-27 MED ORDER — SODIUM BICARBONATE 8.4 % IV SOLN
50.0000 meq | Freq: Once | INTRAVENOUS | Status: AC
  Administered 2017-09-27 (×2): 50 meq via INTRAVENOUS

## 2017-09-27 MED ORDER — SODIUM BICARBONATE 8.4 % IV SOLN
INTRAVENOUS | Status: AC
  Administered 2017-09-27: 50 meq via INTRAVENOUS
  Filled 2017-09-27: qty 100

## 2017-09-27 MED ORDER — ORAL CARE MOUTH RINSE
15.0000 mL | OROMUCOSAL | Status: DC
  Administered 2017-09-27 – 2017-10-02 (×43): 15 mL via OROMUCOSAL

## 2017-09-27 MED ORDER — SODIUM CHLORIDE 0.9 % FOR CRRT
INTRAVENOUS_CENTRAL | Status: DC | PRN
  Filled 2017-09-27: qty 1000

## 2017-09-27 MED ORDER — ACETAMINOPHEN 500 MG PO TABS: 1000.0000 mg | ORAL_TABLET | Freq: Four times a day (QID) | ORAL | Status: DC | PRN

## 2017-09-27 MED ORDER — FAMOTIDINE 20 MG PO TABS: 40.0000 mg | ORAL_TABLET | Freq: Two times a day (BID) | ORAL | Status: DC

## 2017-09-27 MED ORDER — ACETAMINOPHEN 325 MG PO TABS: 650.0000 mg | ORAL_TABLET | Freq: Four times a day (QID) | ORAL | Status: DC | PRN

## 2017-09-27 MED ORDER — LEVOTHYROXINE SODIUM 75 MCG PO TABS: 75.0000 ug | ORAL_TABLET | Freq: Every day | ORAL | Status: DC

## 2017-09-27 MED ORDER — AMIODARONE HCL 200 MG PO TABS: 200.0000 mg | ORAL_TABLET | Freq: Every day | ORAL | Status: DC

## 2017-09-27 NOTE — ED Provider Notes (Signed)
[facility] EMERGENCY DEPARTMENT Provider Note   CSN: [id number] Arrival date & time: 09/28/2017  0749     History   Chief Complaint Chief Complaint  Patient presents with  . Tachycardia    HPI [name] is a 62 y.o. female.  62 year old female with prior medical history as documented below presents for evaluation of nausea, vomiting, rapid heart rate, and shortness of breath.  Patient's history is provided by her mother who is at bedside.  The mother reports that the patient has had nausea and "gagging" over the last 24 hours.  Patient appeared to be uncomfortable this morning and the mother called EMS.  EMS noted a rapid heart rate into the 170s.  They were concerned about possible V. tach and gave the patient a dose of 150 mg of amiodarone.  Upon arrival to the ED the patient's heart rate is in the 90s.  There is no reported recent fever or cough.  Patient does appear to be short of breath and uncomfortable upon my initial evaluation.  Patient denies pain.  Patient does report mild nausea.  The history is provided by the patient, a parent and medical records.  Illness  This is a new problem. The current episode started 12 to 24 hours ago. The problem occurs constantly. The problem has not changed since onset.Associated symptoms include shortness of breath. Pertinent negatives include no chest pain, no abdominal pain and no headaches. Nothing aggravates the symptoms. Nothing relieves the symptoms.    Past Medical History:  Diagnosis Date  . Adiposity 06/04/2011  . Adult hypothyroidism 06/04/2011  . Aortic insufficiency    a. severe by echo, TEE 2019.  Marland Kitchen Benign hypertensive heart disease without heart failure 10/16/2010  . Bradycardia    a. 09/2017 -> required cessation of metoprolol, decrease in amiodarone.  Marland Kitchen CAD (coronary artery disease)    a.  [facility] 07/2017 showed severe AI, nonobstructive CAD (50-60% prox-mid Cx, 40% prox-mid LAD, 30% prox RCA), normal PA  pressures, no L-R shunt, LVEDP , EF 50%..  . Cervix abnormality 1993   MILD ATYPICAL ADENOMATOUS HYPERPLASIA OF CERVIX  . Chronic diastolic CHF (congestive heart failure) (HCC) 08/29/2014  . Chronic respiratory failure with hypercapnia (HCC) 07/06/2012   Followed in Pulmonary clinic/ [facility]  - 05/2012 > 2lpm started at [facility] with HCO3 33-35 on bmets c/w hypercarbia. - PSS 08/29/12 Severe obstructive sleep apnea/hypopnea syndrome, with an AHI of 79 > see OSA - Placed on 24h 02 3lpm at d/c 09/04/14 - stopped 12/2014     . Dermatitis 05-26-12   all over  since 9'13  . Difficult intubation    will plan on spinal anesthesia"small mouth/airway"  . Dyspnea   . Edema of both legs    a. right greater than left, due to Turner's syndrome.  . Gagging episode   . H/O total hip arthroplasty 06/04/2011  . Hyperglycemia 12/23/2011  . Hypertension   . Hypothyroidism   . LAD (lymphadenopathy), mediastinal 08/30/2014  . Obesity 09/16/2014  . Orbital floor (blow-out) closed fracture (HCC) 08/17/2011  . OSA (obstructive sleep apnea) 09/10/2012   NPSG 08/29/12:  Severe obstructive sleep apnea/hypopnea syndrome, with an AHI of 79  06/2016 Bipap 21/17  . Osteoarthritis 10/16/2010   Overview:  Dr. Charlann Boxer (hips and knees)   . PAF (paroxysmal atrial fibrillation) (HCC)   . Periorbital edema   . 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 Severe aortic insufficiency   . Speech disorder    occ. "  stutter"  . Supraventricular tachycardia (HCC) 08/27/2014  . Turner syndrome 06/04/2011  . Turner's syndrome   . VSD (ventricular septal defect)    a. ? by TEE 06/2017, not seen on cardiac CT and no shunt by [facility].    Patient Active Problem List   Diagnosis Date Noted  . Bradycardia 09/15/2017  . Atrial fibrillation with RVR (HCC) 08/16/2017  . Moderate aortic insufficiency   . Ventricular septal defect   . PAF (paroxysmal atrial fibrillation) (HCC) 10/12/2014  . Chronic diastolic CHF (congestive heart failure) (HCC) 10/12/2014    . Gagging episode   . Obesity 09/16/2014  . Dyspnea   . Lung nodule 08/30/2014  . LAD (lymphadenopathy), mediastinal 08/30/2014  . Supraventricular tachycardia (HCC) 08/27/2014  . Persistent atrial fibrillation (HCC) 08/27/2014  . OSA (obstructive sleep apnea) 09/10/2012  . Chronic respiratory failure with hypercapnia (HCC) 07/06/2012  . Obese 06/01/2012  . 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 Hyperglycemia 12/23/2011  . Orbital floor (blow-out) closed fracture (HCC) 08/17/2011  . H/O total hip arthroplasty 06/04/2011  . Adult hypothyroidism 06/04/2011  . Adiposity 06/04/2011  . Turner syndrome 06/04/2011  . Cervix abnormality   . 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 Aortic insufficiency 10/16/2010  . Osteoarthritis 10/16/2010    Past Surgical History:  Procedure Laterality Date  . CARDIOVERSION  06/14/2017   Procedure: CARDIOVERSION;  Surgeon: Elease Hashimoto Deloris Ping, MD;  Location: [facility] ENDOSCOPY;  Service: Cardiovascular;;  . CARDIOVERSION N/A 09/15/2017   Procedure: CARDIOVERSION;  Surgeon: Chilton Si, MD;  Location: [facility] ENDOSCOPY;  Service: Cardiovascular;  Laterality: N/A;  . CERVICAL CONE BIOPSY  1993  . COLPOSCOPY    . DILATION AND CURETTAGE OF UTERUS  1993   CONE BIOPSY  . HERNIA REPAIR    . JOINT REPLACEMENT     hip replacement  . RIGHT/LEFT HEART CATH AND CORONARY ANGIOGRAPHY N/A 07/20/2017   Procedure: RIGHT/LEFT HEART CATH AND CORONARY ANGIOGRAPHY;  Surgeon: Lyn Records, MD;  Location: [facility] INVASIVE CV LAB;  Service: Cardiovascular;  Laterality: N/A;  . TEE WITHOUT CARDIOVERSION N/A 06/14/2017   Procedure: TRANSESOPHAGEAL ECHOCARDIOGRAM (TEE);  Surgeon: Elease Hashimoto Deloris Ping, MD;  Location: [facility] ENDOSCOPY;  Service: Cardiovascular;  Laterality: N/A;  . TOTAL HIP ARTHROPLASTY     LEFT  . TOTAL KNEE ARTHROPLASTY Right 05/31/2012   Procedure: RIGHT TOTAL KNEE ARTHROPLASTY;  Surgeon: Shelda Pal, MD;  Location: [facility] ORS;  Service: Orthopedics;   Laterality: Right;  . ULTRASOUND GUIDANCE FOR VASCULAR ACCESS  07/20/2017   Procedure: Ultrasound Guidance For Vascular Access;  Surgeon: Lyn Records, MD;  Location: [facility] INVASIVE CV LAB;  Service: Cardiovascular;;  . US ECHOCARDIOGRAPHY  06/20/2009   EF 55-60%     OB History    Gravida  0   Para      Term      Preterm      AB      Living        SAB      TAB      Ectopic      Multiple      Live Births               Home Medications    Prior to Admission medications   Medication Sig Start Date End Date Taking? Authorizing Provider  acetaminophen (TYLENOL) 500 MG tablet Take 1,000 mg by mouth every 6 (six) hours as needed for moderate pain or headache.    [provider]  amiodarone (  PACERONE) 200 MG tablet Take 1 tablet (200 mg total) by mouth daily. 09/16/17   Dunn, Tacey Ruiz, PA-C  atorvastatin (LIPITOR) 20 MG tablet Take 1 tablet (20 mg total) by mouth daily. 03/17/17 03/12/18  Tereso Newcomer T, PA-C  cholecalciferol (VITAMIN D) 1000 units tablet Take 1,000 Units by mouth daily.    [provider]  ELIQUIS 5 MG TABS tablet TAKE 1 TABLET BY MOUTH TWICE DAILY Patient taking differently: Take 5 mg by mouth 2 (two) times daily.  07/23/17   Nahser, Deloris Ping, MD  famotidine (PEPCID) 40 MG tablet Take 40 mg by mouth 2 (two) times daily.    [provider]  furosemide (LASIX) 40 MG tablet Take 1 tablet (40 mg total) by mouth 2 (two) times daily. 09/08/17 12/07/17  Nahser, Deloris Ping, MD  levothyroxine (SYNTHROID, LEVOTHROID) 75 MCG tablet Take 1 tablet (75 mcg total) by mouth daily before breakfast. 08/21/17   Ali Lowe, MD  potassium chloride (K-DUR,KLOR-CON) 10 MEQ tablet Take 1 tablet (10 mEq total) by mouth 2 (two) times daily. 09/08/17   Nahser, Deloris Ping, MD  spironolactone (ALDACTONE) 25 MG tablet Take one half tablet (12.5 mg) daily Patient taking differently: Take 12.5 mg by mouth daily.  08/31/17   Nahser, Deloris Ping, MD  traMADol (ULTRAM) 50 MG  tablet Take 50 mg by mouth every 6 (six) hours as needed for moderate pain.     [provider]    Family History Family History  Problem Relation Age of Onset  . Hypertension Mother   . Cancer Mother        SKIN CANCER  . Atrial fibrillation Mother   . Breast cancer Cousin        MATERNAL COUSIN  . Diabetes Maternal Uncle     Social History Social History   Tobacco Use  . Smoking status: Never Smoker  . Smokeless tobacco: Never Used  Substance Use Topics  . Alcohol use: No  . Drug use: No     Allergies   Metoprolol; Cephalexin; and Latex   Review of Systems Review of Systems  Unable to perform ROS: Acuity of condition  Respiratory: Positive for shortness of breath.   Cardiovascular: Negative for chest pain.  Gastrointestinal: Negative for abdominal pain.  Neurological: Negative for headaches.  All other systems reviewed and are negative.    Physical Exam Updated Vital Signs BP 96/62   Pulse 87   Resp (!) 27   SpO2 98%   Physical Exam  Constitutional: She is oriented to person, place, and time. She appears well-nourished. She appears distressed.  HENT:  Head: Normocephalic and atraumatic.  Mouth/Throat: Oropharynx is clear and moist.  Eyes: Pupils are equal, round, and reactive to light. Conjunctivae and EOM are normal.  Neck: Normal range of motion. Neck supple.  Cardiovascular: Normal heart sounds.  Irregularly irregular - HR 90's  Pulmonary/Chest: Effort normal and breath sounds normal. No respiratory distress.  Abdominal: Soft. She exhibits no distension. There is no tenderness.  Musculoskeletal: Normal range of motion. She exhibits edema. She exhibits no deformity.  2 plus edema to BLE   Neurological: She is alert and oriented to person, place, and time.  Skin: Skin is warm and dry.  Psychiatric: She has a normal mood and affect.  Nursing note and vitals reviewed.    ED Treatments / Results  Labs (all labs ordered are listed, but  only abnormal results are displayed) Labs Reviewed  COMPREHENSIVE METABOLIC PANEL - Abnormal; Notable  for the following components:      Result Value   Sodium 132 (*)    Potassium 5.9 (*)    Chloride 93 (*)    CO2 17 (*)    Glucose, Bld 194 (*)    BUN 27 (*)    Creatinine, Ser 1.98 (*)    Total Protein 5.8 (*)    Albumin 3.4 (*)    AST 216 (*)    ALT 223 (*)    Total Bilirubin 3.5 (*)    GFR calc non Af Amer 26 (*)    GFR calc Af Amer 30 (*)    Anion gap 22 (*)    All other components within normal limits  CBC WITH DIFFERENTIAL/PLATELET - Abnormal; Notable for the following components:   WBC 19.0 (*)    Hemoglobin 15.9 (*)    HCT 52.0 (*)    MCV 106.8 (*)    Neutro Abs 14.8 (*)    Monocytes Absolute 2.6 (*)    Abs Immature Granulocytes 0.4 (*)    All other components within normal limits  BRAIN NATRIURETIC PEPTIDE - Abnormal; Notable for the following components:   B Natriuretic Peptide >4,500.0 (*)    All other components within normal limits  MAGNESIUM - Abnormal; Notable for the following components:   Magnesium 2.6 (*)    All other components within normal limits  PHOSPHORUS - Abnormal; Notable for the following components:   Phosphorus 7.5 (*)    All other components within normal limits  I-STAT CHEM 8, ED - Abnormal; Notable for the following components:   Sodium 130 (*)    Potassium 5.6 (*)    Chloride 94 (*)    BUN 34 (*)    Creatinine, Ser 1.80 (*)    Glucose, Bld 193 (*)    Calcium, Ion 0.99 (*)    Hemoglobin 17.7 (*)    HCT 52.0 (*)    All other components within normal limits  I-STAT TROPONIN, ED - Abnormal; Notable for the following components:   Troponin i, poc 2.16 (*)    All other components within normal limits  I-STAT CG4 LACTIC ACID, ED - Abnormal; Notable for the following components:   Lactic Acid, Venous 10.38 (*)    All other components within normal limits  I-STAT CG4 LACTIC ACID, ED - Abnormal; Notable for the following components:    Lactic Acid, Venous 10.61 (*)    All other components within normal limits  I-STAT VENOUS BLOOD GAS, ED - Abnormal; Notable for the following components:   pH, Ven 7.184 (*)    pO2, Ven 27.0 (*)    Acid-base deficit 8.0 (*)    All other components within normal limits  CULTURE, BLOOD (ROUTINE X 2)  CULTURE, BLOOD (ROUTINE X 2)  URINALYSIS, ROUTINE W REFLEX MICROSCOPIC  BLOOD GAS, ARTERIAL  BLOOD GAS, VENOUS    EKG EKG Interpretation  Date/Time:  Monday September 27 2017 08:02:32 EDT Ventricular Rate:  91 PR Interval:    QRS Duration: 152 QT Interval:  407 QTC Calculation: 501 R Axis:   166 Text Interpretation:  Sinus rhythm IVCD, consider atypical RBBB Anteroseptal infarct, age indeterminate Lateral leads are also involved Confirmed by Kristine Royal [phone]) on 09/16/2017 8:06:57 AM   Radiology Dg Chest Portable 1 View  Result Date: 09/16/2017 CLINICAL DATA:  Ventricular tachycardia.  Hypertension EXAM: PORTABLE CHEST 1 VIEW COMPARISON:  September 10, 2016 FINDINGS: Monitoring devices overlie portions of the chest. There is no demonstrable edema or consolidation.  There are minimal pleural effusions bilaterally. There is cardiomegaly with pulmonary vascularity normal. No adenopathy. There is aortic atherosclerosis. No evident bone lesions. There is calcification in the left carotid artery. IMPRESSION: Minimal pleural effusions bilaterally. No frank edema or consolidation. Stable cardiomegaly. There is aortic atherosclerosis as well as calcification in the left carotid artery. Aortic Atherosclerosis (ICD10-I70.0). Electronically Signed   By: Bretta Bang III M.D.   On: 09/22/2017 08:26    Procedures Procedures (including critical care time) CRITICAL CARE Performed by: Wynetta Fines   Total critical care time: 45 minutes  Critical care time was exclusive of separately billable procedures and treating other patients.  Critical care was necessary to treat or prevent  imminent or life-threatening deterioration.  Critical care was time spent personally by me on the following activities: development of treatment plan with patient and/or surrogate as well as nursing, discussions with consultants, evaluation of patient's response to treatment, examination of patient, obtaining history from patient or surrogate, ordering and performing treatments and interventions, ordering and review of laboratory studies, ordering and review of radiographic studies, pulse oximetry and re-evaluation of patient's condition.   Medications Ordered in ED Medications  sodium chloride 0.9 % bolus 500 mL (500 mLs Intravenous Bolus from Bag 09/16/2017 0910)  meropenem (MERREM) 1 g in sodium chloride 0.9 % 100 mL IVPB (1 g Intravenous New Bag/Given 09/17/2017 0939)  vancomycin (VANCOCIN) 1,250 mg in sodium chloride 0.9 % 250 mL IVPB (1,250 mg Intravenous New Bag/Given 09/25/2017 0955)     Initial Impression / Assessment and Plan / ED Course  I have reviewed the triage vital signs and the nursing notes.  Pertinent labs & imaging results that were available during my care of the patient were reviewed by me and considered in my medical decision making (see chart for details).    CHA2DS2/VAS Stroke Risk Points  Current as of 19 minutes ago     4 >= 2 Points: High Risk  1 - 1.99 Points: Medium Risk  0 Points: Low Risk    This is the only CHA2DS2/VAS Stroke Risk Points available for the past  year.:  Last Change: N/A     Details    This score determines the patient's risk of having a stroke if the  patient has atrial fibrillation.       Points Metrics  1 Has Congestive Heart Failure:  Yes    Current as of 19 minutes ago  1 Has Vascular Disease:  Yes    Current as of 19 minutes ago  1 Has Hypertension:  Yes    Current as of 19 minutes ago  0 Age:  53    Current as of 19 minutes ago  0 Has Diabetes:  No    Current as of 19 minutes ago  0 Had Stroke:  No  Had TIA:  No  Had  thromboembolism:  No    Current as of 19 minutes ago  1 Female:  Yes    Current as of 19 minutes ago          MDM  Screen complete  Patient is presenting for evaluation of reported respiratory distress and rapid heart rate.  Patient appears to be possibly septic upon arrival.  Initial lactic acid is 10.  Heart rate is controlled upon arrival after EMS administration of amiodarone in route.  Other screening labs suggest acute kidney injury and dehydration.  Patient given broad-spectrum antibiotics and cultures obtained in the ED. Patient and family understand  plan of care.   Critical care service [facility] is aware of case and will admit for further work-up and treatment.    Cardiology service is also aware of the case and will evaluate for further treatment.   Final Clinical Impressions(s) / ED Diagnoses   Final diagnoses:  Atrial fibrillation with RVR (HCC)  Tachycardia  Lactic acid acidosis  Shortness of breath    ED Discharge Orders    None       Wynetta FinesMessick, Peter C, MD November 01, 2017 1005

## 2017-09-27 NOTE — Progress Notes (Signed)
Discussed with Dr. Vassie LollAlva with CCM about IVs leaking around site. Loss of Right AC PIV. Inability to obtain accurate BP or Doppler BP. Order for PICC line placement.   Pharmacy discussed transition of PO meds to IV. Orders placed for IV amiodarone and heparin.

## 2017-09-27 NOTE — Progress Notes (Signed)
RT attempted x 3 to place arterial line.  RT able to get blood flow but unable to thread catheter.  RN notified and paged MD.

## 2017-09-27 NOTE — Progress Notes (Signed)
Dr. Craige CottaSood Paged. Notified that RT unable to place A-Line. RN has been unable to obtain a BP or Doppler BP since arrival to the unit at 1105. ER nurse reported same difficulty. Dr. Craige CottaSood said he will review.

## 2017-09-27 NOTE — Progress Notes (Signed)
[facility] K+ 6.8. Orders placed.

## 2017-09-27 NOTE — Procedures (Signed)
Hemodialysis Catheter Insertion Procedure Note [name] [id number] 1955/01/14  Procedure: Insertion of Hemodialysis Catheter Indications: Possible CRRT  Procedure Details Consent: Risks of procedure as well as the alternatives and risks of each were explained to the (patient/caregiver).  Consent for procedure obtained. Time Out: Verified patient identification, verified procedure, site/side was marked, verified correct patient position, special equipment/implants available, medications/allergies/relevent history reviewed, required imaging and test results available.  Performed  Maximum sterile technique was used including antiseptics, cap, gloves, gown, hand hygiene, mask and sheet. Skin prep: Chlorhexidine; local anesthetic administered A antimicrobial bonded/coated triple lumen catheter was placed in the left internal jugular vein using the Seldinger technique.  Evaluation Blood flow good Complications: No apparent complications Patient did tolerate procedure well. Chest X-ray ordered to verify placement.  CXR: pending.  Procedure performed under direct ultrasound guidance for real time vessel cannulation.      Rutherford Guysahul Vianne Grieshop, [place] - C [facility] Pgr: [phone]  or [phone] 10/01/2017, 7:41 PM

## 2017-09-27 NOTE — ED Triage Notes (Signed)
Per ems-is coming from home, this morning started having SOB, denied CP. Feeling nauseated. Fire dept reports HR 190, ems saw 170 HR, wide complex. Started amnio drip 150 mg. After amnio started denied SOB, given 4 mg zofran. BP 90/70. 97% 4 L.

## 2017-09-27 NOTE — Progress Notes (Signed)
RT NOTE: RT transported patient with RN from ED to 2H13 with no complications. RT will continue to monitor.

## 2017-09-27 NOTE — Progress Notes (Signed)
Dr. Craige CottaSood, Dr. Molli KnockYacoub, Dr. Antoine PocheHochrein and Rutherford Guysahul Desai, PA at bedside. A-Line placed by Dr. Bard HerbertYacob. Echo performed by Dr. Antoine PocheHochrein. Pt to be intubated, and to have trialysis cath placed. Mother of pt at bedside with difficulty in understanding procedures being performed. Education performed by RN, and medical staff. Mother escorted to waiting room by Dr. Craige CottaSood.   Pt intubated at 1650 by Rutherford Guysahul Desai, PA and Dr. Molli KnockYacoub. Trialysis catheter placed at 1715 by Rutherford Guysahul Desai, PA

## 2017-09-27 NOTE — Progress Notes (Signed)
RT NOTE: Patient placed on bipap per MD order. Patient states she is comfortable on bipap. RT will continue to monitor.

## 2017-09-27 NOTE — Progress Notes (Signed)
Posey BoyerBrooke Simpson, PA with CCM at bedside. Doppler BP obtained with MAP of 72. Awaiting call from PICC nurses for placement.

## 2017-09-27 NOTE — ED Notes (Signed)
Report given to [place], RN 2H. Unable to get accurate BP, manual 65 systolic. Cards spoke with CCM, plan for central line on unit. Transporting to floor.

## 2017-09-27 NOTE — Progress Notes (Signed)
Posey BoyerBrooke Simpson, PA with CCM at bedside. A-line attempt x2. Unable to thread catheter. Still unable to obtain an accurate BP or Doppler BP. CCM aware. Keep Bipap on for mentation.

## 2017-09-27 NOTE — Procedures (Signed)
Intubation Procedure Note [name] [id number] August 27, 1955  Procedure: Intubation Indications: Respiratory insufficiency  Procedure Details Consent: Risks of procedure as well as the alternatives and risks of each were explained to the (patient/caregiver).  Consent for procedure obtained. Time Out: Verified patient identification, verified procedure, site/side was marked, verified correct patient position, special equipment/implants available, medications/allergies/relevent history reviewed, required imaging and test results available.  Performed  Drugs:  100 mcg Fentanyl, 2 mg Versed, 20 mg Etomidate, 100 mg Rocuronium. VL x 1 with # 3 blade. Grade 1 view. 7.0 tube visualized passing through vocal cords. Following intubation:  positive color change on ETCO2, condensation seen in endotracheal tube, equal breath sounds bilaterally.  Evaluation Hemodynamic Status: Persistent hypotension treated with pressors and fluid; O2 sats: stable throughout Patient's Current Condition: stable Complications: No apparent complications Patient did tolerate procedure well. Chest X-ray ordered to verify placement.  CXR: pending.   [name] Longman, [place] - C [place] [facility] Pager: [phone]  or [phone] 09/21/2017, 7:40 PM

## 2017-09-27 NOTE — Progress Notes (Signed)
BMET shows progressive hyperkalemia and worsening renal function.  Unable to get accurate blood pressure readings.  Given calcium, bicarb.  Foley inserted with minimal urine outpt.  Renal consult.  Will place HD catheter and arterial line.  Check renal u/s.  Concerned she might need CRRT.  Updated pt's mother at bedside.  Additional CC time 34 minutes.  Coralyn HellingVineet Farron Lafond, MD [facility] 09/23/2017, 6:32 PM

## 2017-09-27 NOTE — H&P (Signed)
NAME:  [name], MRN:  [id number], DOB:  1955/02/07, LOS: 0 ADMISSION DATE:  09/08/2017, CONSULTATION DATE:  10/01/2017 REFERRING MD:  Dr. Rodena MedinMessick, ER, CHIEF COMPLAINT:  Short of breath   Brief History   62 yo female with A fib and RVR, hypotension, acute renal failure, hypoxia, and lactic acidosis.  She was in hospital in September 2019 with A fib with RVR and severe aortic insufficiency.  She had cardioversion on 09/15/17, and then had bradycardia.  Her amiodarone dose was reduced.  She is followed by Dr. Vassie LollAlva for OSA on Bipap.  Past Medical History Turner's syndrome, VSD, SVT, Aortic insufficiency, A fib, OSA, OA, Hypothyroidism, Hypertension, Difficult intubation, Chronic diastolic CHF, CAD  Significant Hospital Events   9/23 Admit  Consults: date of consult/date signed off & final recs:  Cardiology 9/23 a fib with RVR >>  Procedures (surgical and bedside):   Significant Diagnostic Tests:  RHC/LHC 07/20/17 >> EF 50%, severe aortic regurgitation, non obstructive CAD, normal PA pressures, no shunt  Micro Data: Blood 9/23 >> Urine 9/23 >>  Antimicrobials:  Levaquin 9/23 >>    Subjective:  Pt unable to provide history.  Objective   Blood pressure 96/62, pulse 87, resp. rate (!) 27, SpO2 98 %.       No intake or output data in the 24 hours ending 09/28/2017 1004 There were no vitals filed for this visit.  Examination:  General - anxious Eyes - pupils reactive ENT - bipap mask on, webbed neck Cardiac - irregular, tachycardic, 2/6 SM Chest - basilar crackles, no wheeze Abdomen - soft, non tender, + bowel sounds, no hepatosplenomegaly Extremities - 1+ non pitting edema Skin - no rashes Lymphatics - no lymphadenopathy Neuro - moves extremities, follows simple commands with stimulation  Assessment & Plan:   A fib with RVR. Aortic insufficiency. Hypotension. Acute on chronic diastolic CHF. HLD. - hold outpt lasix, aldactone - continue eliquis, amiodarone,  lipitor - continue IV fluids - might need pressors if BP remains low, and volume resuscitation limited - cardiology consulted by EDP  Acute hypoxic respiratory failure. Obstructive sleep apnea. - Bipap qhs and prn - oxygen to keep SpO2 > 90%  Hx of hypothyroidism. - continue synthroid  Lactic acidosis. - most likely from hypoxia/hypotension in setting of A fib and acute diastolic CHF - no clear source of infection, but could have UTI - f/u lactic acid - check procalcitonin - levaquin for possible UTI  Acute renal failure likely from volume depletion >> baseline creatinine 0.93 from 09/13/17. Hyperkalemia. - hold lasix, aldactone - NS IV fluid at 50 ml/hr - f/u BMET  Polycythemia. - concerned she might have chronic hypoxia  - might need to assess for home oxygen prior to discharge, particularly at night with Bipap  Disposition / Summary of Today's Plan 09/26/2017   Admit to ICU    Diet: NPO, sips with meds DVT prophylaxis:  Eliquis GI prophylaxis: Pepcid Mobility: Bed rest Code Status: Full code Family Communication: Update pt's mother at bedside  Labs   CBC: Recent Labs  Lab 10/01/2017 0810 09/16/2017 0819  WBC 19.0*  --   NEUTROABS 14.8*  --   HGB 15.9* 17.7*  HCT 52.0* 52.0*  MCV 106.8*  --   PLT 325  --    Basic Metabolic Panel: Recent Labs  Lab 09/11/2017 0810 09/13/2017 0819  NA 132* 130*  K 5.9* 5.6*  CL 93* 94*  CO2 17*  --   GLUCOSE 194* 193*  BUN  27* 34*  CREATININE 1.98* 1.80*  CALCIUM 9.3  --   MG 2.6*  --   PHOS 7.5*  --    GFR: Estimated Creatinine Clearance: 24.1 mL/min (A) (by C-G formula based on SCr of 1.8 mg/dL (H)). Recent Labs  Lab 10/26/2017 0810 2017/10/26 0819 10/26/17 0950  WBC 19.0*  --   --   LATICACIDVEN  --  10.38* 10.61*   Liver Function Tests: Recent Labs  Lab 10/26/17 0810  AST 216*  ALT 223*  ALKPHOS 105  BILITOT 3.5*  PROT 5.8*  ALBUMIN 3.4*   No results for input(s): LIPASE, AMYLASE in the last 168  hours. No results for input(s): AMMONIA in the last 168 hours. ABG    Component Value Date/Time   PHART 7.356 07/20/2017 0939   PCO2ART 44.4 07/20/2017 0939   PO2ART 67.0 (L) 07/20/2017 0939   HCO3 21.6 2017/10/26 0949   TCO2 23 10-26-2017 0949   ACIDBASEDEF 8.0 (H) 10-26-17 0949   O2SAT 35.0 26-Oct-2017 0949    Coagulation Profile: No results for input(s): INR, PROTIME in the last 168 hours. Cardiac Enzymes: No results for input(s): CKTOTAL, CKMB, CKMBINDEX, TROPONINI in the last 168 hours. HbA1C: Hgb A1c MFr Bld  Date/Time Value Ref Range Status  08/28/2014 03:05 PM 5.7 (H) 4.8 - 5.6 % Final    Comment:    (NOTE)         Pre-diabetes: 5.7 - 6.4         Diabetes: >6.4         Glycemic control for adults with diabetes: <7.0    CBG: No results for input(s): GLUCAP in the last 168 hours.   CXR 9/23 reviewed by me >> b/l effusions  Admitting History of Present Illness.   62 yo female with A fib and RVR, hypotension, acute renal failure, hypoxia, and lactic acidosis.  She was in hospital in September 2019 with A fib with RVR and severe aortic insufficiency.  She had cardioversion on 09/15/17, and then had bradycardia.  Her amiodarone dose was reduced.  She is followed by Dr. Vassie Loll for OSA on Bipap.  Hx from pt's mother.  No report of fever, chest pain, cough, or sputum.  Had dry heaves.  No diarrhea.  Mother not aware of change in urine.  Has more leg swelling.  Pt not able to provide hx due to altered mental status.  Review of Systems:   Unable to obtain.  Past medical history  She,  has a past medical history of Adiposity (06/04/2011), Adult hypothyroidism (06/04/2011), Aortic insufficiency, Benign hypertensive heart disease without heart failure (10/16/2010), Bradycardia, CAD (coronary artery disease), Cervix abnormality (1993), Chronic diastolic CHF (congestive heart failure) (HCC) (08/29/2014), Chronic respiratory failure with hypercapnia (HCC) (07/06/2012), Dermatitis  (05-26-12), Difficult intubation, Dyspnea, Edema of both legs, Gagging episode, H/O total hip arthroplasty (06/04/2011), Hyperglycemia (12/23/2011), Hypertension, Hypothyroidism, LAD (lymphadenopathy), mediastinal (08/30/2014), Obesity (09/16/2014), Orbital floor (blow-out) closed fracture (HCC) (08/17/2011), OSA (obstructive sleep apnea) (09/10/2012), Osteoarthritis (10/16/2010), PAF (paroxysmal atrial fibrillation) (HCC), Periorbital edema, 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   Surgical History    Past Surgical History:  Procedure Laterality Date  . CARDIOVERSION  06/14/2017   Procedure: CARDIOVERSION;  Surgeon: Elease Hashimoto Deloris Ping, MD;  Location: [facility] ENDOSCOPY;  Service: Cardiovascular;;  . CARDIOVERSION N/A 09/15/2017   Procedure: CARDIOVERSION;  Surgeon: Chilton Si, MD;  Location: [facility] ENDOSCOPY;  Service: Cardiovascular;  Laterality:  N/A;  . CERVICAL CONE BIOPSY  1993  . COLPOSCOPY    . DILATION AND CURETTAGE OF UTERUS  1993   CONE BIOPSY  . HERNIA REPAIR    . JOINT REPLACEMENT     hip replacement  . RIGHT/LEFT HEART CATH AND CORONARY ANGIOGRAPHY N/A 07/20/2017   Procedure: RIGHT/LEFT HEART CATH AND CORONARY ANGIOGRAPHY;  Surgeon: Lyn Records, MD;  Location: [facility] INVASIVE CV LAB;  Service: Cardiovascular;  Laterality: N/A;  . TEE WITHOUT CARDIOVERSION N/A 06/14/2017   Procedure: TRANSESOPHAGEAL ECHOCARDIOGRAM (TEE);  Surgeon: Elease Hashimoto Deloris Ping, MD;  Location: [facility] ENDOSCOPY;  Service: Cardiovascular;  Laterality: N/A;  . TOTAL HIP ARTHROPLASTY     LEFT  . TOTAL KNEE ARTHROPLASTY Right 05/31/2012   Procedure: RIGHT TOTAL KNEE ARTHROPLASTY;  Surgeon: Shelda Pal, MD;  Location: [facility] ORS;  Service: Orthopedics;  Laterality: Right;  . ULTRASOUND GUIDANCE FOR VASCULAR ACCESS  07/20/2017   Procedure: Ultrasound Guidance For Vascular  Access;  Surgeon: Lyn Records, MD;  Location: [facility] INVASIVE CV LAB;  Service: Cardiovascular;;  . US ECHOCARDIOGRAPHY  06/20/2009   EF 55-60%     Social History   Social History   Socioeconomic History  . Marital status: Single    Spouse name: Not on file  . Number of children: Not on file  . Years of education: Not on file  . Highest education level: Not on file  Occupational History  . Occupation: N/A    Employer: NOT EMPLOYED  Social Needs  . Financial resource strain: Not on file  . Food insecurity:    Worry: Not on file    Inability: Not on file  . Transportation needs:    Medical: Not on file    Non-medical: Not on file  Tobacco Use  . Smoking status: Never Smoker  . Smokeless tobacco: Never Used  Substance and Sexual Activity  . Alcohol use: No  . Drug use: No  . Sexual activity: Never    Birth control/protection: Post-menopausal  Lifestyle  . Physical activity:    Days per week: Not on file    Minutes per session: Not on file  . Stress: Not on file  Relationships  . Social connections:    Talks on phone: Not on file    Gets together: Not on file    Attends religious service: Not on file    Active member of club or organization: Not on file    Attends meetings of clubs or organizations: Not on file    Relationship status: Not on file  . Intimate partner violence:    Fear of current or ex partner: Not on file    Emotionally abused: Not on file    Physically abused: Not on file    Forced sexual activity: Not on file  Other Topics Concern  . Not on file  Social History Narrative  . Not on file  ,  reports that she has never smoked. She has never used smokeless tobacco. She reports that she does not drink alcohol or use drugs.   Family history   Her family history includes Atrial fibrillation in her mother; Breast cancer in her cousin; Cancer in her mother; Diabetes in her maternal uncle; Hypertension in her mother.   Allergies Allergies  Allergen  Reactions  . Metoprolol     In 09/2017, developed severe bradycardia post cardioversion and required cessation of beta blocker. She is not allergic, but should be noted that she has h/o significant bradycardia.  Marland Kitchen  Cephalexin Rash and Other (See Comments)    Prefers not to have ? Psoriasis skin condition  . Latex Rash    Home meds  Prior to Admission medications   Medication Sig Start Date End Date Taking? Authorizing Provider  acetaminophen (TYLENOL) 500 MG tablet Take 1,000 mg by mouth every 6 (six) hours as needed for moderate pain or headache.    [provider]  amiodarone (PACERONE) 200 MG tablet Take 1 tablet (200 mg total) by mouth daily. 09/16/17   Dunn, Tacey Ruiz, PA-C  atorvastatin (LIPITOR) 20 MG tablet Take 1 tablet (20 mg total) by mouth daily. 03/17/17 03/12/18  Tereso Newcomer T, PA-C  cholecalciferol (VITAMIN D) 1000 units tablet Take 1,000 Units by mouth daily.    [provider]  ELIQUIS 5 MG TABS tablet TAKE 1 TABLET BY MOUTH TWICE DAILY Patient taking differently: Take 5 mg by mouth 2 (two) times daily.  07/23/17   Nahser, Deloris Ping, MD  famotidine (PEPCID) 40 MG tablet Take 40 mg by mouth 2 (two) times daily.    [provider]  furosemide (LASIX) 40 MG tablet Take 1 tablet (40 mg total) by mouth 2 (two) times daily. 09/08/17 12/07/17  Nahser, Deloris Ping, MD  levothyroxine (SYNTHROID, LEVOTHROID) 75 MCG tablet Take 1 tablet (75 mcg total) by mouth daily before breakfast. 08/21/17   Ali Lowe, MD  potassium chloride (K-DUR,KLOR-CON) 10 MEQ tablet Take 1 tablet (10 mEq total) by mouth 2 (two) times daily. 09/08/17   Nahser, Deloris Ping, MD  spironolactone (ALDACTONE) 25 MG tablet Take one half tablet (12.5 mg) daily Patient taking differently: Take 12.5 mg by mouth daily.  08/31/17   Nahser, Deloris Ping, MD  traMADol (ULTRAM) 50 MG tablet Take 50 mg by mouth every 6 (six) hours as needed for moderate pain.     [provider]     CC time 38 minutes  Coralyn Helling, MD [facility] Pulmonary/Critical Care 2017/10/26, 10:22 AM

## 2017-09-27 NOTE — Procedures (Signed)
Arterial Catheter Insertion Procedure Note [name] [id number] 03-18-1955  Procedure: Insertion of Arterial Catheter  Indications: Blood pressure monitoring and Frequent blood sampling  Procedure Details Consent: Risks of procedure as well as the alternatives and risks of each were explained to the (patient/caregiver).  Consent for procedure obtained. Time Out: Verified patient identification, verified procedure, site/side was marked, verified correct patient position, special equipment/implants available, medications/allergies/relevent history reviewed, required imaging and test results available.  Performed  Maximum sterile technique was used including antiseptics, cap, gloves, gown, hand hygiene, mask and sheet. Skin prep: Chlorhexidine; local anesthetic administered 20 gauge catheter was inserted into left radial artery using the Seldinger technique. ULTRASOUND GUIDANCE USED: NO Evaluation Blood flow good; BP tracing good. Complications: No apparent complications.   [name],[name] 09/05/2017

## 2017-09-27 NOTE — Progress Notes (Signed)
RT NOTE: RT x2 attempted ABG stick with doppler and was unsuccessful. MD notified and RT will continue to monitor.

## 2017-09-27 NOTE — Progress Notes (Signed)
Notification from PICC nurses about PICC placement. Stated that they were at [facility], and will then be heading to [facility] for evaluation of PICC placement.

## 2017-09-27 NOTE — Progress Notes (Signed)
Dr. Antoine PocheHochrein with Cardiology at bedside. Notified of voltage change in EKG. Beside EKG performed noting NSR with low voltage QRS, Anterolateral infarct (age undetermined), Prolonged QT.

## 2017-09-27 NOTE — Consult Note (Addendum)
Cardiology Consultation:   Patient ID: [name]; [id number]; [birth year]   Admit date: 09/16/2017 Date of Consult: 09/05/2017  Primary Care Provider: Tracey HarriesBouska, David, MD Primary Cardiologist: Dr Elease HashimotoNahser Primary Electrophysiologist:  n/a   Patient Profile:   Donna DeeLori M Jiron is a 62 y.o. female with a hx of mod-severe AR/AI, D-CHF, OSA on BiPAP, Turner's syndrome, persistent A fib,  who is being seen today for the evaluation of WCT at the request of Dr Craige CottaSood.  History of Present Illness:   [name] was found to have severe aortic regurgitation on echo 05/2017.  LVEF is 55 to 60%. TEE 06/14/2017 revealed LVEF 50 to 55% with severe aortic regurgitation.  The procedure was aborted early due to airway concerns and SVT which required cardioversion.    Possible small to moderate perimembranous VSD, but this was not seen on CT. The CT showed Ascending Aortic root 3.5 cm with abnormal appearing arch which is elongated and horizontal with 'pseudo coarctation' tapering from 19 mm to 15 mm in it's distal portion before descending.   R/L heart catheterization 07/2017 revealed nonobstructive CAD in the mid left circumflex and severe AI. Saw Dr. Laneta SimmersBartle 08/09/17 who felt her AI halftime pressures still represented moderate AI and did not think there was a clear indication for surgery given relatively asymptomatic nature.   Cardiac CT overread also showed small pulm nodule in LUL statistically likely benign (radiology recommended f/u CT 12 months if high risk, no f/u if low risk).   09/23 pt developed SOB and came to ER. HR reported 170, WCT. IV amio started. Initial eval w/ K+ 5.9, BNP 3020, poc Trop 2.16, lactic acid 10.38, WBC 19. ECG w/ RBBB, new. Cards asked to see.   Pt's mother is in the room.  She cares for the patient.  The patient is able to answer some simple questions, but because of the shortness of breath and BiPAP, is not able to speak clearly.  [name] initially was doing pretty well  when she went home.  Her mother tracks her weight daily and there was a trend upward, from 136 pounds up to 139 pounds.  However, on 9/19, her weight decreased to 132 and was 133 today.  She was developing lower extremity edema.  The mother states that she prepares the food and is very vigilant about hidden sodium in foods and they do not add salt.  She does not feel there was any sodium indiscretion.  [name] had initially done well, Friday was an especially good day.  However, Saturday she seemed a little bit under the weather and more dyspneic on exertion.  Sunday, she was eating very little, seemed to get short of breath with everything, and was not able to sleep at night, describing orthopnea and PND.  There was also some gagging and the patient appeared to be uncomfortable this morning.  That is why they came to the hospital.  Heart rates are reported in the 190s and then by EMS in the 170s.  The EMS ECG shows WCT w/ HR 171. By the time she got to the emergency room, her heart rate was SR in the 90s.  Currently her heart rate is sinus rhythm, rate 85. QRS is still wide, new for her.  She is resting more comfortably on the BiPAP.  She states she did not have any chest pain.  She has had no fevers.  There has not been a productive cough.  Currently in the emergency room, her  blood pressure is dropping and she has limited IV access, which limits resuscitative fluids.  She appears uncomfortable but the only thing she complains of is wanting something to drink.   Past Medical History:  Diagnosis Date  . Adiposity 06/04/2011  . Adult hypothyroidism 06/04/2011  . Aortic insufficiency    a. severe by echo, TEE 2019.  Marland Kitchen Benign hypertensive heart disease without heart failure 10/16/2010  . Bradycardia    a. 09/2017 -> required cessation of metoprolol, decrease in amiodarone.  Marland Kitchen CAD (coronary artery disease)    a.  [facility] 07/2017 showed severe AI, nonobstructive CAD (50-60% prox-mid Cx, 40% prox-mid  LAD, 30% prox RCA), normal PA pressures, no L-R shunt, LVEDP , EF 50%..  . Cervix abnormality 1993   MILD ATYPICAL ADENOMATOUS HYPERPLASIA OF CERVIX  . Chronic diastolic CHF (congestive heart failure) (HCC) 08/29/2014  . Chronic respiratory failure with hypercapnia (HCC) 07/06/2012   Followed in Pulmonary clinic/ [facility]  - 05/2012 > 2lpm started at [facility] with HCO3 33-35 on bmets c/w hypercarbia. - PSS 08/29/12 Severe obstructive sleep apnea/hypopnea syndrome, with an AHI of 79 > see OSA - Placed on 24h 02 3lpm at d/c 09/04/14 - stopped 12/2014     . Dermatitis 05-26-12   all over  since 9'13  . Difficult intubation    will plan on spinal anesthesia"small mouth/airway"  . Dyspnea   . Edema of both legs    a. right greater than left, due to Turner's syndrome.  . Gagging episode   . H/O total hip arthroplasty 06/04/2011  . Hyperglycemia 12/23/2011  . Hypertension   . Hypothyroidism   . LAD (lymphadenopathy), mediastinal 08/30/2014  . Obesity 09/16/2014  . Orbital floor (blow-out) closed fracture (HCC) 08/17/2011  . OSA (obstructive sleep apnea) 09/10/2012   NPSG 08/29/12:  Severe obstructive sleep apnea/hypopnea syndrome, with an AHI of 79  06/2016 Bipap 21/17  . Osteoarthritis 10/16/2010   Overview:  Dr. Charlann Boxer (hips and knees)   . PAF (paroxysmal atrial fibrillation) (HCC)   . Periorbital edema   . 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 Severe aortic insufficiency   . Speech disorder    occ. "stutter"  . Supraventricular tachycardia (HCC) 08/27/2014  . Turner syndrome 06/04/2011  . Turner's syndrome   . VSD (ventricular septal defect)    a. ? by TEE 06/2017, not seen on cardiac CT and no shunt by [facility].    Past Surgical History:  Procedure Laterality Date  . CARDIOVERSION  06/14/2017   Procedure: CARDIOVERSION;  Surgeon: Elease Hashimoto Deloris Ping, MD;  Location: [facility] ENDOSCOPY;  Service: Cardiovascular;;  . CARDIOVERSION N/A 09/15/2017   Procedure: CARDIOVERSION;  Surgeon: Chilton Si, MD;   Location: [facility] ENDOSCOPY;  Service: Cardiovascular;  Laterality: N/A;  . CERVICAL CONE BIOPSY  1993  . COLPOSCOPY    . DILATION AND CURETTAGE OF UTERUS  1993   CONE BIOPSY  . HERNIA REPAIR    . JOINT REPLACEMENT     hip replacement  . RIGHT/LEFT HEART CATH AND CORONARY ANGIOGRAPHY N/A 07/20/2017   Procedure: RIGHT/LEFT HEART CATH AND CORONARY ANGIOGRAPHY;  Surgeon: Lyn Records, MD;  Location: [facility] INVASIVE CV LAB;  Service: Cardiovascular;  Laterality: N/A;  . TEE WITHOUT CARDIOVERSION N/A 06/14/2017   Procedure: TRANSESOPHAGEAL ECHOCARDIOGRAM (TEE);  Surgeon: Elease Hashimoto Deloris Ping, MD;  Location: [facility] ENDOSCOPY;  Service: Cardiovascular;  Laterality: N/A;  . TOTAL HIP ARTHROPLASTY     LEFT  . TOTAL KNEE ARTHROPLASTY Right 05/31/2012   Procedure: RIGHT TOTAL KNEE  ARTHROPLASTY;  Surgeon: Shelda Pal, MD;  Location: [facility] ORS;  Service: Orthopedics;  Laterality: Right;  . ULTRASOUND GUIDANCE FOR VASCULAR ACCESS  07/20/2017   Procedure: Ultrasound Guidance For Vascular Access;  Surgeon: Lyn Records, MD;  Location: [facility] INVASIVE CV LAB;  Service: Cardiovascular;;  . US ECHOCARDIOGRAPHY  06/20/2009   EF 55-60%     Prior to Admission medications   Medication Sig Start Date End Date Taking? Authorizing Provider  acetaminophen (TYLENOL) 500 MG tablet Take 1,000 mg by mouth every 6 (six) hours as needed for moderate pain or headache.    [provider]  amiodarone (PACERONE) 200 MG tablet Take 1 tablet (200 mg total) by mouth daily. 09/16/17   Dunn, Tacey Ruiz, PA-C  atorvastatin (LIPITOR) 20 MG tablet Take 1 tablet (20 mg total) by mouth daily. 03/17/17 03/12/18  Tereso Newcomer T, PA-C  cholecalciferol (VITAMIN D) 1000 units tablet Take 1,000 Units by mouth daily.    [provider]  ELIQUIS 5 MG TABS tablet TAKE 1 TABLET BY MOUTH TWICE DAILY Patient taking differently: Take 5 mg by mouth 2 (two) times daily.  07/23/17   Nahser, Deloris Ping, MD  famotidine (PEPCID) 40 MG tablet Take 40 mg by mouth 2  (two) times daily.    [provider]  furosemide (LASIX) 40 MG tablet Take 1 tablet (40 mg total) by mouth 2 (two) times daily. 09/08/17 12/07/17  Nahser, Deloris Ping, MD  levothyroxine (SYNTHROID, LEVOTHROID) 75 MCG tablet Take 1 tablet (75 mcg total) by mouth daily before breakfast. 08/21/17   Ali Lowe, MD  potassium chloride (K-DUR,KLOR-CON) 10 MEQ tablet Take 1 tablet (10 mEq total) by mouth 2 (two) times daily. 09/08/17   Nahser, Deloris Ping, MD  spironolactone (ALDACTONE) 25 MG tablet Take one half tablet (12.5 mg) daily Patient taking differently: Take 12.5 mg by mouth daily.  08/31/17   Nahser, Deloris Ping, MD  traMADol (ULTRAM) 50 MG tablet Take 50 mg by mouth every 6 (six) hours as needed for moderate pain.     [provider]    Inpatient Medications: Scheduled Meds: . amiodarone  200 mg Oral Daily  . apixaban  5 mg Oral BID  . atorvastatin  20 mg Oral Daily  . famotidine  40 mg Oral BID  . insulin aspart  0-9 Units Subcutaneous Q4H  . [START ON 09/28/2017] levothyroxine  75 mcg Oral QAC breakfast   Continuous Infusions: . sodium chloride    . levofloxacin (LEVAQUIN) IV    . vancomycin 1,250 mg (09/09/2017 0955)   PRN Meds:   Allergies:    Allergies  Allergen Reactions  . Metoprolol     In 09/2017, developed severe bradycardia post cardioversion and required cessation of beta blocker. She is not allergic, but should be noted that she has h/o significant bradycardia.  . Cephalexin Rash and Other (See Comments)    Prefers not to have ? Psoriasis skin condition  . Latex Rash    Social History:   Social History   Socioeconomic History  . Marital status: Single    Spouse name: Not on file  . Number of children: Not on file  . Years of education: Not on file  . Highest education level: Not on file  Occupational History  . Occupation: Disabled    Associate Professor: NOT EMPLOYED  Social Needs  . Financial resource strain: Not on file  . Food insecurity:    Worry: Not  on file    Inability:  Not on file  . Transportation needs:    Medical: Not on file    Non-medical: Not on file  Tobacco Use  . Smoking status: Never Smoker  . Smokeless tobacco: Never Used  Substance and Sexual Activity  . Alcohol use: No  . Drug use: No  . Sexual activity: Never    Birth control/protection: Post-menopausal  Lifestyle  . Physical activity:    Days per week: Not on file    Minutes per session: Not on file  . Stress: Not on file  Relationships  . Social connections:    Talks on phone: Not on file    Gets together: Not on file    Attends religious service: Not on file    Active member of club or organization: Not on file    Attends meetings of clubs or organizations: Not on file    Relationship status: Not on file  . Intimate partner violence:    Fear of current or ex partner: Not on file    Emotionally abused: Not on file    Physically abused: Not on file    Forced sexual activity: Not on file  Other Topics Concern  . Not on file  Social History Narrative   This with her mother, who cares for her.    Family History:   Family History  Problem Relation Age of Onset  . Hypertension Mother   . Cancer Mother        SKIN CANCER  . Atrial fibrillation Mother   . Breast cancer Cousin        MATERNAL COUSIN  . Diabetes Maternal Uncle    Family Status:  Family Status  Relation Name Status  . Mother  Alive  . Cousin  (Not Specified)  . Mat Uncle  (Not Specified)  . Father  Deceased  . MGM  Deceased  . MGF  Deceased  . PGM  Deceased  . PGF  Deceased    ROS:  Please see the history of present illness.  All other ROS reviewed and negative.     Physical Exam/Data:   Vitals:   September 28, 2017 0815 09-28-17 0822 2017/09/28 0846 09-28-2017 0930  BP: 115/80   96/62  Pulse:   85 87  Resp:  (!) 28 (!) 22 (!) 27  SpO2:  94% 100% 98%   No intake or output data in the 24 hours ending Sep 28, 2017 1058 There were no vitals filed for this visit. There is no height or  weight on file to calculate BMI.  General:  Well nourished, well developed, in moderate distress HEENT: no change since 09/13/2017 Lymph: no adenopathy Neck: no JVD seen, not able to assess due to body habitus and BiPAP in place Endocrine:  No thryomegaly Vascular: No carotid bruits; upper extremity pulses very faint, lower extremity pulses not palpable, capillary refill delayed Cardiac:  normal S1, S2; RRR; no murmur appreciated, heart sounds are decreased due to respiratory noise and BiPAP Lungs: Decreased breath sounds bases bilaterally, no wheezing, rhonchi or rales  Abd: soft, nontender, no hepatomegaly  Ext: 2-3+ lower extremity edema Musculoskeletal:  No deformities, BUE and BLE strength weak but equal Skin: warm and dry  Neuro:  CNs 2-12 intact, no focal abnormalities noted Psych: Anxious  EKG:  The EKG was personally reviewed and demonstrates:  EMS ECG is WCT, HR 171 ER ECG is Sinus rhythm, heart rate 91, new bundle branch block with QRS 152 ms, previously 84 ms Telemetry:  Telemetry was personally reviewed and  demonstrates: Sinus rhythm  Relevant CV Studies:  ECHO: 05/05/2017 - Left ventricle: The cavity size was normal. Wall thickness was   normal. Systolic function was normal. The estimated ejection   fraction was in the range of 55% to 60%. Doppler parameters are   consistent with abnormal left ventricular relaxation (grade 1   diastolic dysfunction). - Aortic valve: Trileaflet; mildly calcified leaflets. Mildly   elevated mean gradient across aortic valve but the valve appears   to open well. Suspect this is due to high flow with severe aortic   insufficiency. There was severe regurgitation. Mean gradient (S):   15 mm Hg. - Mitral valve: There was no significant regurgitation. - Left atrium: The atrium was moderately dilated. - Right ventricle: The cavity size was normal. Systolic function   was normal. - Pulmonary arteries: No complete TR doppler jet so unable to    estimate PA systolic pressure. - Inferior vena cava: The vessel was normal in size. The   respirophasic diameter changes were in the normal range (= 50%),   consistent with normal central venous pressure.  Impressions:  - Normal LV size and systolic function, EF 55-60%. Normal RV size   and systolic function. Moderately dilated LA. The aortic valve   was trileaflet with severe aortic insufficiency. Mean gradient   across the valve was mildly elevated, but suspect this was due to   high flow in the setting of aortic insufficiency rather than   valvular stenosis.   TEE: 06/14/2017 - Left ventricle: The estimated ejection fraction was in the range   of 50% to 55%. No evidence of thrombus. - Aortic valve: There was severe regurgitation. - Mitral valve: There was mild regurgitation. - Left atrium: No evidence of thrombus in the atrial cavity or   appendage. - Right atrium: No evidence of thrombus in the atrial cavity or   appendage.  Impressions:  - Low normal LV function   Severe AI . The procedure was aborted early due to airway   concerns and SVT which required cardioversion   Small - moderate sized perimembranous VSD . Successful   cardioversion. No cardiac source of emboli was indentified.   CATH: 07/20/2017  Severe aortic regurgitation  Low normal LV systolic function.  LV enlargement, mild, with globular appearing LV cavity.  EF 50%.  LVEDP 18 mmHg.  Nonobstructive coronary disease with 50 to 60% proximal to mid circumflex.  Segmental 40% proximal and mid LAD disease.  30% proximal RCA.  Left main is normal.  Normal pulmonary artery pressures.  No left to right shunt identified by oximetry run.  RECOMMENDATIONS:   Will refer to Dr. Evelene Croon for consideration of surgical aortic valve management.  Laboratory Data:  Chemistry Recent Labs  Lab October 06, 2017 0810 10/06/2017 0819  NA 132* 130*  K 5.9* 5.6*  CL 93* 94*  CO2 17*  --   GLUCOSE 194* 193*  BUN  27* 34*  CREATININE 1.98* 1.80*  CALCIUM 9.3  --   GFRNONAA 26*  --   GFRAA 30*  --   ANIONGAP 22*  --     Lab Results  Component Value Date   ALT 223 (H) 10/06/17   AST 216 (H) 10/06/2017   ALKPHOS 105 10-06-2017   BILITOT 3.5 (H) 06-Oct-2017   Hematology Recent Labs  Lab 10/06/17 0810 10/06/2017 0819  WBC 19.0*  --   RBC 4.87  --   HGB 15.9* 17.7*  HCT 52.0* 52.0*  MCV 106.8*  --  MCH 32.6  --   MCHC 30.6  --   RDW 13.9  --   PLT 325  --    Cardiac EnzymesNo results for input(s): TROPONINI in the last 168 hours.  Recent Labs  Lab 09/15/2017 0818  TROPIPOC 2.16*    BNP Recent Labs  Lab 09/17/2017 0810  BNP >4,500.0*     TSH:  Lab Results  Component Value Date   TSH 8.274 (H) 09/15/2017   Lipids: Lab Results  Component Value Date   CHOL 119 05/05/2017   HDL 49 05/05/2017   LDLCALC 54 05/05/2017   TRIG 80 05/05/2017   CHOLHDL 2.4 05/05/2017   HgbA1c: Lab Results  Component Value Date   HGBA1C 5.7 (H) 08/28/2014   Magnesium:  Magnesium  Date Value Ref Range Status  09/10/2017 2.6 (H) 1.7 - 2.4 mg/dL Final    Comment:    Performed at [facility], [address], [place], [place] [address]     Radiology/Studies:  Dg Chest Portable 1 View  Result Date: 09/26/2017 CLINICAL DATA:  Ventricular tachycardia.  Hypertension EXAM: PORTABLE CHEST 1 VIEW COMPARISON:  September 10, 2016 FINDINGS: Monitoring devices overlie portions of the chest. There is no demonstrable edema or consolidation. There are minimal pleural effusions bilaterally. There is cardiomegaly with pulmonary vascularity normal. No adenopathy. There is aortic atherosclerosis. No evident bone lesions. There is calcification in the left carotid artery. IMPRESSION: Minimal pleural effusions bilaterally. No frank edema or consolidation. Stable cardiomegaly. There is aortic atherosclerosis as well as calcification in the left carotid artery. Aortic Atherosclerosis (ICD10-I70.0). Electronically  Signed   By: Bretta Bang III M.D.   On: 09/07/2017 08:26    Assessment and Plan:   Active Problems: 1.  Atrial fibrillation with RVR (HCC) -We will review ECG with MD to see if this is felt to be rapid atrial fib with aberrancy or VT. - Either way, continue amiodarone -Currently maintaining sinus rhythm, follow  2.  Acute on chronic diastolic (congestive) heart failure (HCC) -Per her mother's records, the patient's weight was trending up and then went back down without any change in her medications.  The mother states that her p.o. intake had been decreasing, but lower extremity edema have been increasing. - She is compliant with medications and low-sodium diet - Currently, blood pressure is too low to administer diuretics and she is getting fluid resuscitation - Continue to follow volume status closely, patient would benefit from CVP  3.  Acute renal failure (ARF) (HCC) -DC Aldactone, blood pressure currently prohibits getting Lasix -Per CCM  4.  Hyperkalemia - DC Aldactone, otherwise management per CCM    For questions or updates, please contact [facility] Please consult [email or web address] for contact info under Cardiology/STEMI.   Signed, Theodore Demark, PA-C  09/17/2017 10:58 AM   History and all data above reviewed.  Patient examined.  The patient is unable to give answers because she is on BiPAP and somnolent.  She was in the hospital for DCCV for atrial fib earlier this month.  She was at home and since discharge on the 12th her mom says that she was doing relatively well and even had one very good day.  She developed gradual increased dyspnea and increased leg edema.  On the day prior to admission she used her mom's O2 via [place].  EMS was called on the day of admission because her HR was increased suddenly.  There was a wide complex rhythm that was consistent with ventricular tach  and she was treated with IV amiodarone.  On presentation to the ED she was in NSR with IVCD.   She was hypotensive with acidosis and in respiratory failure.  She is treated with BiPAP.  She is being treated with Leviquin for possible UTI.   Diuretic is held secondary to hypotension and lack of edema on CXR.  She is oxygenating well on BiPAP.   She is   I agree with the findings as above.  The patient exam reveals COR:RRR  ,  Lungs: Decreased breath sounds  ,  Abd: Positive bowel sounds, no rebound no guarding, Ext No edema.      All available labs, radiology testing, previous records reviewed. Agree with documented assessment and plan.   Bedside echo without effusion.  Endocardium not well seen.  There does appear to be global hypokinesis.  With lower EF (unable to truly estimate).  She will need a repeat TTE.   Unfortunately, we are unable to monitor her BP as they are only able to get Doppler MAP.  Cuff not working.  I have spoken with Dr. Craige Cotta given her increased creat and worsening creatinine she should get an arterial line (they have been unable get a radial and will possibly need a femoral).  Suggest access to obtain CVP and co-ox if .  Needs ABG.  We have started IV amiodarone and IV heparin.   V tach:  I have discussed with EP but evaluation will be pending the work up and treatment above.   Fayrene Fearing Latyra Jaye  5:52 PM  09/29/17

## 2017-09-27 NOTE — Consult Note (Signed)
Reason for Consult:AKI, hyperkalemia Referring Physician: Dr. Suzie Portela RENEA SCHOONMAKER is an 62 y.o. female.  HPI: 62 yr female with hx, obesity, HTN, CAD, CHF, hypothyroid, Turners Syndrome, OSA, TKR, THR, VSD and chronic atrial dysrhythmias.  Hosp 11 day ago with bradycardia post DCCV.  Presented this am with SOB, N, V.  Had VT , low bps, ^^lactate, AGMA, ^ Procalcitonin.  Worsened in past few Hours and now entub on NE 30 mcg/min ,Cr on admit 1.98, now 2.2. K 5.9, now 6.8.  Bicarb 17-19.  Baseline S Cr, 1.14.  No NSAIDs, No ACEI but was on po KCl and Spironolactone.  Review of systems not obtained due to patient factors.   Past Medical History:  Diagnosis Date  . Adiposity 06/04/2011  . Adult hypothyroidism 06/04/2011  . Aortic insufficiency    a. severe by echo, TEE 2019.  Marland Kitchen Benign hypertensive heart disease without heart failure 10/16/2010  . Bradycardia    a. 09/2017 -> required cessation of metoprolol, decrease in amiodarone.  Marland Kitchen CAD (coronary artery disease)    a.  [facility] 07/2017 showed severe AI, nonobstructive CAD (50-60% prox-mid Cx, 40% prox-mid LAD, 30% prox RCA), normal PA pressures, no L-R shunt, LVEDP 32mHg, EF 50%..  . Cervix abnormality 1993   MILD ATYPICAL ADENOMATOUS HYPERPLASIA OF CERVIX  . Chronic diastolic CHF (congestive heart failure) ([place] 08/29/2014  . Chronic respiratory failure with hypercapnia ([place] 07/06/2012   [facility] [place] - [facility]. - PSS 08/29/12 Severe obstructive sleep apnea/hypopnea syndrome, with an AHI of 79 > see OSA - Placed on 24h 02 3lpm at d/c 09/04/14 - stopped 12/2014     . Dermatitis 05-26-12   all over  since 9'13  . Difficult intubation    will plan on spinal anesthesia"small mouth/airway"  . Dyspnea   . Edema of both legs    a. right greater than left, due to Turner's syndrome.  . Gagging episode   . H/O total hip arthroplasty 06/04/2011  .  Hyperglycemia 12/23/2011  . Hypertension   . Hypothyroidism   . LAD (lymphadenopathy), mediastinal 08/30/2014  . Obesity 09/16/2014  . Orbital floor (blow-out) closed fracture ([place] 08/17/2011  . OSA (obstructive sleep apnea) 09/10/2012   NPSG 08/29/12:  Severe obstructive sleep apnea/hypopnea syndrome, with an AHI of 79  06/2016 Bipap 21/17  . Osteoarthritis 10/16/2010   Overview:  Dr. OAlvan Dame(hips and knees)   . PAF (paroxysmal atrial fibrillation) ([place]   . Periorbital edema   . 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 Severe aortic insufficiency   . Speech disorder    occ. "stutter"  . Supraventricular tachycardia ([place] 08/27/2014  . Turner syndrome 06/04/2011  . Turner's syndrome   . VSD (ventricular septal defect)    a. ? by TEE 06/2017, not seen on cardiac CT and [facility]    Past Surgical History:  Procedure Laterality Date  . CARDIOVERSION  06/14/2017   Procedure: CARDIOVERSION;  Surgeon: NAcie FredricksonPWonda Cheng MD;  [place]  Service: Cardiovascular;;  . CARDIOVERSION N/A 09/15/2017   Procedure: CARDIOVERSION;  Surgeon: RSkeet Latch MD;  [place]  Service: Cardiovascular;  Laterality: N/A;  . [place] COL[place]    . DILATION AND CURETTAGE OF UTERUS  1993   CONE BIOPSY  . HERNIA REPAIR    . JOINT REPLACEMENT     hip replacement  .  RIGHT/LEFT HEART CATH AND CORONARY ANGIOGRAPHY N/A 07/20/2017   Procedure: RIGHT/LEFT HEART CATH AND CORONARY ANGIOGRAPHY;  Surgeon: Belva Crome, MD;  Location: [place] [facility];  Service: Cardiovascular;  Laterality: N/A;  . TEE WITHOUT CARDIOVERSION N/A 06/14/2017   Procedure: TRANSESOPHAGEAL ECHOCARDIOGRAM (TEE);  Surgeon: Acie Fredrickson Wonda Cheng, MD;  Location: [facility] ENDOSCOPY;  Service: Cardiovascular;  Laterality: N/A;  . TOTAL HIP ARTHROPLASTY     LEFT  . TOTAL KNEE ARTHROPLASTY Right 05/31/2012   Procedure: RIGHT TOTAL KNEE ARTHROPLASTY;  Surgeon: Mauri Pole, MD;  Location: [facility];  Service:  Orthopedics;  Laterality: Right;  . ULTRASOUND GUIDANCE FOR VASCULAR ACCESS  07/20/2017   Procedure: Ultrasound Guidance For Vascular Access;  Surgeon: Belva Crome, MD;  Location: [place] CV LAB;  Service: Cardiovascular;;  . US ECHOCARDIOGRAPHY  06/20/2009   EF 55-60%    Family History  Problem Relation Age of Onset  . Hypertension Mother   . Cancer Mother        SKIN CANCER  . Atrial fibrillation Mother   . Breast cancer Cousin        MATERNAL COUSIN  . Diabetes Maternal Uncle     Social History:  reports that she has never smoked. She has never used smokeless tobacco. She reports that she does not drink alcohol or use drugs.  Allergies:  Allergies  Allergen Reactions  . Metoprolol     In 09/2017, developed severe bradycardia post cardioversion and required cessation of beta blocker. She is not allergic, but should be noted that she has h/o significant bradycardia.  . Cephalexin Rash and Other (See Comments)    Prefers not to have ? Psoriasis skin condition  . Tape Rash    At times develops light rash with some itching to adhesive areas of band-aids    Medications:  I have reviewed the patient's current medications. Prior to Admission:  Medications Prior to Admission  Medication Sig Dispense Refill Last Dose  . acetaminophen (TYLENOL) 500 MG tablet Take 1,000 mg by mouth every 6 (six) hours as needed for moderate pain or headache.   09/26/2017 at Unknown time  . amiodarone (PACERONE) 200 MG tablet Take 1 tablet (200 mg total) by mouth daily. 30 tablet 2 09/26/2017 at Unknown time  . atorvastatin (LIPITOR) 20 MG tablet Take 1 tablet (20 mg total) by mouth daily. 30 tablet 11 09/26/2017 at Unknown time  . cholecalciferol (VITAMIN D) 1000 units tablet Take 1,000 Units by mouth daily.   09/26/2017 at Unknown time  . ELIQUIS 5 MG TABS tablet TAKE 1 TABLET BY MOUTH TWICE DAILY (Patient taking differently: Take 5 mg by mouth 2 (two) times daily. ) 60 tablet 10 09/26/2017 at  1800   . famotidine (PEPCID) 40 MG tablet Take 40 mg by mouth 2 (two) times daily.   09/26/2017 at Unknown time  . furosemide (LASIX) 40 MG tablet Take 1 tablet (40 mg total) by mouth 2 (two) times daily. 180 tablet 3 09/26/2017 at Unknown time  . levothyroxine (SYNTHROID, LEVOTHROID) 75 MCG tablet Take 1 tablet (75 mcg total) by mouth daily before breakfast. 30 tablet 2 09/26/2017 at Unknown time  . potassium chloride (K-DUR,KLOR-CON) 10 MEQ tablet Take 1 tablet (10 mEq total) by mouth 2 (two) times daily. 180 tablet 3 09/26/2017 at Unknown time  . spironolactone (ALDACTONE) 25 MG tablet Take one half tablet (12.5 mg) daily (Patient taking differently: Take 12.5 mg by mouth daily. ) 90 tablet 3 09/26/2017 at Unknown time  .  traMADol (ULTRAM) 50 MG tablet Take 50 mg by mouth every 6 (six) hours as needed for moderate pain.    unk at prn    Results for orders placed or performed during the hospital encounter of 09/13/2017 (from the past 48 hour(s))  Comprehensive metabolic panel     Status: Abnormal   Collection Time: 09/23/2017  8:10 AM  Result Value Ref Range   Sodium 132 (L) 135 - 145 mmol/L   Potassium 5.9 (H) 3.5 - 5.1 mmol/L    Comment: SLIGHT HEMOLYSIS   Chloride 93 (L) 98 - 111 mmol/L   CO2 17 (L) 22 - 32 mmol/L   Glucose, Bld 194 (H) 70 - 99 mg/dL   BUN 27 (H) 8 - 23 mg/dL   Creatinine, Ser 1.98 (H) 0.44 - 1.00 mg/dL   Calcium 9.3 8.9 - 10.3 mg/dL   Total Protein 5.8 (L) 6.5 - 8.1 g/dL   Albumin 3.4 (L) 3.5 - 5.0 g/dL   AST 216 (H) 15 - 41 U/L   ALT 223 (H) 0 - 44 U/L   Alkaline Phosphatase 105 38 - 126 U/L   Total Bilirubin 3.5 (H) 0.3 - 1.2 mg/dL   GFR calc non Af Amer 26 (L) >60 mL/min   GFR calc Af Amer 30 (L) >60 mL/min    Comment: (NOTE) The eGFR has been calculated using the CKD EPI equation. This calculation has not been validated in all clinical situations. eGFR's persistently <60 mL/min signify possible Chronic Kidney Disease.    Anion gap 22 (H) 5 - 15    Comment: Performed  at [facility], [place] [address], [place], [place] [address]  CBC with Differential     Status: Abnormal   Collection Time: 09/21/2017  8:10 AM  Result Value Ref Range   WBC 19.0 (H) 4.0 - 10.5 K/uL   RBC 4.87 3.87 - 5.11 MIL/uL   Hemoglobin 15.9 (H) 12.0 - 15.0 g/dL   HCT 52.0 (H) 36.0 - 46.0 %   MCV 106.8 (H) 78.0 - 100.0 fL   MCH 32.6 26.0 - 34.0 pg   MCHC 30.6 30.0 - 36.0 g/dL   RDW 13.9 11.5 - 15.5 %   Platelets 325 150 - 400 K/uL   Neutrophils Relative % 78 %   Neutro Abs 14.8 (H) 1.7 - 7.7 K/uL   Lymphocytes Relative 6 %   Lymphs Abs 1.1 0.7 - 4.0 K/uL   Monocytes Relative 14 %   Monocytes Absolute 2.6 (H) 0.1 - 1.0 K/uL   Eosinophils Relative 0 %   Eosinophils Absolute 0.0 0.0 - 0.7 K/uL   Basophils Relative 0 %   Basophils Absolute 0.1 0.0 - 0.1 K/uL   Immature Granulocytes 2 %   Abs Immature Granulocytes 0.4 (H) 0.0 - 0.1 K/uL    Comment: Performed at [facility] [address] [place], [place] [address]  Brain natriuretic peptide     Status: Abnormal   Collection Time: 09/11/2017  8:10 AM  Result Value Ref Range   B Natriuretic Peptide >4,500.0 (H) 0.0 - 100.0 pg/mL    Comment: Performed at [facility] [address], [place], [place] [address]  Magnesium     Status: Abnormal   Collection Time: 09/26/2017  8:10 AM  Result Value Ref Range   Magnesium 2.6 (H) 1.7 - 2.4 mg/dL    Comment: Performed at [facility] [address], [place], [place] [address]  Phosphorus  Status: Abnormal   Collection Time: 10/04/2017  8:10 AM  Result Value Ref Range   Phosphorus 7.5 (H) 2.5 - 4.6 mg/dL    Comment: Performed at [facility] [address], [place], [place] [address]  I-stat troponin, ED     Status: Abnormal   Collection Time: 09/18/2017  8:18 AM  Result Value Ref Range   Troponin i, poc 2.16 (HH) 0.00 - 0.08 ng/mL   Comment NOTIFIED PHYSICIAN    Comment 3            Comment: Due to the release kinetics of cTnI, a negative result within  the first hours of the onset of symptoms does not rule out myocardial infarction with certainty. If myocardial infarction is still suspected, repeat the test at appropriate intervals.   I-stat Chem 8, ED     Status: Abnormal   Collection Time: 09/21/2017  8:19 AM  Result Value Ref Range   Sodium 130 (L) 135 - 145 mmol/L   Potassium 5.6 (H) 3.5 - 5.1 mmol/L   Chloride 94 (L) 98 - 111 mmol/L   BUN 34 (H) 8 - 23 mg/dL   Creatinine, Ser 1.80 (H) 0.44 - 1.00 mg/dL   Glucose, Bld 193 (H) 70 - 99 mg/dL   Calcium, Ion 0.99 (L) 1.15 - 1.40 mmol/L   TCO2 22 22 - 32 mmol/L   Hemoglobin 17.7 (H) 12.0 - 15.0 g/dL   HCT 52.0 (H) 36.0 - 46.0 %  I-Stat CG4 Lactic Acid, ED     Status: Abnormal   Collection Time: 09/11/2017  8:19 AM  Result Value Ref Range   Lactic Acid, Venous 10.38 (HH) 0.5 - 1.9 mmol/L   Comment NOTIFIED PHYSICIAN   I-Stat venous blood gas, ED     Status: Abnormal   Collection Time: 09/23/2017  9:49 AM  Result Value Ref Range   pH, Ven 7.184 (LL) 7.250 - 7.430   pCO2, Ven 57.2 44.0 - 60.0 mmHg   pO2, Ven 27.0 (LL) 32.0 - 45.0 mmHg   Bicarbonate 21.6 20.0 - 28.0 mmol/L   TCO2 23 22 - 32 mmol/L   O2 Saturation 35.0 %   Acid-base deficit 8.0 (H) 0.0 - 2.0 mmol/L   Patient temperature HIDE    Sample type VENOUS    Comment NOTIFIED PHYSICIAN   I-Stat CG4 Lactic Acid, ED     Status: Abnormal   Collection Time: 09/11/2017  9:50 AM  Result Value Ref Range   Lactic Acid, Venous 10.61 (HH) 0.5 - 1.9 mmol/L   Comment NOTIFIED PHYSICIAN   Glucose, capillary     Status: Abnormal   Collection Time: 09/11/2017 12:38 PM  Result Value Ref Range   Glucose-Capillary 68 (L) 70 - 99 mg/dL   Comment 1 Capillary Specimen    Comment 2 Notify RN   Cortisol     Status: None   Collection Time: 09/22/2017 12:40 PM  Result Value Ref Range   Cortisol, Plasma 71.7 ug/dL    Comment: RESULTS CONFIRMED BY MANUAL DILUTION (NOTE) AM    6.7 - 22.6 ug/dL PM   <10.0       ug/dL Performed at [facility], [address], [place], [place] [address]   Lactic acid, plasma     Status: Abnormal   Collection Time: 09/12/2017 12:40 PM  Result Value Ref Range   Lactic Acid, Venous 10.5 (HH) 0.5 - 1.9 mmol/L    Comment: RESULTS CONFIRMED BY MANUAL DILUTION CRITICAL RESULT CALLED  TO, READ BACK BY AND VERIFIED WITH: K.USSLEY,RN 1424 09/11/2017 CLARK,S Performed at [facility] [address], [place], [place] [address]   Glucose, capillary     Status: None   Collection Time: 09/25/2017  1:17 PM  Result Value Ref Range   Glucose-Capillary 70 70 - 99 mg/dL   Comment 1 Capillary Specimen   Glucose, capillary     Status: Abnormal   Collection Time: 09/08/2017  2:14 PM  Result Value Ref Range   Glucose-Capillary 140 (H) 70 - 99 mg/dL   Comment 1 Capillary Specimen   Basic metabolic panel     Status: Abnormal   Collection Time: 09/06/2017  3:44 PM  Result Value Ref Range   Sodium 136 135 - 145 mmol/L   Potassium 6.8 (HH) 3.5 - 5.1 mmol/L    Comment: NO VISIBLE HEMOLYSIS CRITICAL RESULT CALLED TO, READ BACK BY AND VERIFIED WITH: Bea Graff RN [phone] [id number]    Chloride 95 (L) 98 - 111 mmol/L   CO2 19 (L) 22 - 32 mmol/L   Glucose, Bld 191 (H) 70 - 99 mg/dL   BUN 28 (H) 8 - 23 mg/dL   Creatinine, Ser 2.20 (H) 0.44 - 1.00 mg/dL   Calcium 8.9 8.9 - 10.3 mg/dL   GFR calc non Af Amer 23 (L) >60 mL/min   GFR calc Af Amer 26 (L) >60 mL/min    Comment: (NOTE) The eGFR has been calculated using the CKD EPI equation. This calculation has not been validated in all clinical situations. eGFR's persistently <60 mL/min signify possible Chronic Kidney Disease.    Anion gap 22 (H) 5 - 15    Comment: Performed at [facility], [place] [address] [place], [place] [address]  Procalcitonin     Status: None   Collection Time: 09/29/2017  3:44 PM  Result Value Ref Range   Procalcitonin 1.04 ng/mL    Comment:        Interpretation: PCT > 0.5 ng/mL and <= 2 ng/mL: Systemic infection (sepsis) is  possible, but other conditions are known to elevate PCT as well. (NOTE)       Sepsis PCT Algorithm           Lower Respiratory Tract                                      Infection PCT Algorithm    ----------------------------     ----------------------------         PCT < 0.25 ng/mL                PCT < 0.10 ng/mL         Strongly encourage             Strongly discourage   discontinuation of antibiotics    initiation of antibiotics    ----------------------------     -----------------------------       PCT 0.25 - 0.50 ng/mL            PCT 0.10 - 0.25 ng/mL               OR       >80% decrease in PCT            Discourage initiation of  antibiotics      Encourage discontinuation           of antibiotics    ----------------------------     -----------------------------         PCT >= 0.50 ng/mL              PCT 0.26 - 0.50 ng/mL                AND       <80% decrease in PCT             Encourage initiation of                                             antibiotics       Encourage continuation           of antibiotics    ----------------------------     -----------------------------        PCT >= 0.50 ng/mL                  PCT > 0.50 ng/mL               AND         increase in PCT                  Strongly encourage                                      initiation of antibiotics    Strongly encourage escalation           of antibiotics                                     -----------------------------                                           PCT <= 0.25 ng/mL                                                 OR                                        > 80% decrease in PCT                                     Discontinue / Do not initiate                                             antibiotics Performed at [facility], [place] [address] [place], [place] [address]   Glucose, capillary     Status: Abnormal   Collection Time: 09/17/2017  4:13 PM  Result Value Ref Range   Glucose-Capillary 177 (H) 70 - 99 mg/dL   Comment 1 Capillary Specimen    Comment 2 Notify RN   Urinalysis, Routine w reflex microscopic     Status: Abnormal   Collection Time: 09/07/2017  6:16 PM  Result Value Ref Range   Color, Urine AMBER (A) YELLOW    Comment: BIOCHEMICALS MAY BE AFFECTED BY COLOR   APPearance TURBID (A) CLEAR   Specific Gravity, Urine 1.021 1.005 - 1.030   pH 5.0 5.0 - 8.0   Glucose, UA 150 (A) NEGATIVE mg/dL   Hgb urine dipstick LARGE (A) NEGATIVE   Bilirubin Urine NEGATIVE NEGATIVE   Ketones, ur 5 (A) NEGATIVE mg/dL   Protein, ur 100 (A) NEGATIVE mg/dL   Nitrite NEGATIVE NEGATIVE   Leukocytes, UA MODERATE (A) NEGATIVE   RBC / HPF 11-20 0 - 5 RBC/hpf   WBC, UA 6-10 0 - 5 WBC/hpf   Bacteria, UA MANY (A) NONE SEEN   Squamous Epithelial / LPF 0-5 0 - 5   Mucus PRESENT    Hyaline Casts, UA PRESENT    Amorphous Crystal PRESENT     Comment: Performed at [facility], [place] [address], [place], [place] [address]    Dg Chest Portable 1 View  Result Date: 09/19/2017 CLINICAL DATA:  Ventricular tachycardia.  Hypertension EXAM: PORTABLE CHEST 1 VIEW COMPARISON:  September 10, 2016 FINDINGS: Monitoring devices overlie portions of the chest. There is no demonstrable edema or consolidation. There are minimal pleural effusions bilaterally. There is cardiomegaly with pulmonary vascularity normal. No adenopathy. There is aortic atherosclerosis. No evident bone lesions. There is calcification in the left carotid artery. IMPRESSION: Minimal pleural effusions bilaterally. No frank edema or consolidation. Stable cardiomegaly. There is aortic atherosclerosis as well as calcification in the left carotid artery. Aortic Atherosclerosis (ICD10-I70.0). Electronically Signed   By: Lowella Grip III M.D.   On: 09/16/2017 08:26   [place] Ekg Site Rite  Result Date: 09/06/2017 If Site Rite image not attached, placement could not be confirmed due to  current cardiac rhythm.   ROS Blood pressure 124/74, pulse 99, temperature (!) 96.5 F (35.8 C), temperature source Axillary, resp. rate (!) 22, weight 59.9 kg, SpO2 100 %. Physical Exam Physical Examination: General appearance - pale, webbed neck , obese, on vent Mental status - not responsive to stim Eyes - pinpoint now, cannot see fundi, pos corneals Mouth - mucous membranes moist, pharynx normal without lesions Neck - webbed, obese, L IJ HD cath Lymphatics - no palpable lymphadenopathy, no hepatosplenomegaly Chest - clear to auscultation, no wheezes, rales or rhonchi, symmetric air entry Heart - irreg, decreased HS, cannot detect M Abdomen - obese, very few bs. Liver down 5 cm Neurological - not responsive Extremities - pedal edema 2 + Skin - pale, moles, doughy  Assessment/Plan: 1 AKI low bps, hypoperfusion in setting of Spirono, KCL, Sepsis vs cardiac.  Needs bp support, lower acid load, lower K .  Has vol xs but hold off removal.  Need to do CRRT 2 VT/Atrial dysrhythmia on AMio 3 Hypotension on NE 4. VDRF on Vent 5. AI 6CAD 7 Turners 8 Hypothyroid P CRRT, 0k, keep even, get [place],   [name] 09/14/2017, 7:35 PM

## 2017-09-27 NOTE — Progress Notes (Signed)
ANTICOAGULATION CONSULT NOTE - Initial Consult  Pharmacy Consult for Heparin Indication: atrial fibrillation  Allergies  Allergen Reactions  . Metoprolol     In 09/2017, developed severe bradycardia post cardioversion and required cessation of beta blocker. She is not allergic, but should be noted that she has h/o significant bradycardia.  . Cephalexin Rash and Other (See Comments)    Prefers not to have ? Psoriasis skin condition  . Latex Rash    Patient Measurements:     Vital Signs: BP: 96/62 (09/23 1511) Pulse Rate: 91 (09/23 1511)  Labs: Recent Labs    09/24/2017 0810 09/26/2017 0819  HGB 15.9* 17.7*  HCT 52.0* 52.0*  PLT 325  --   CREATININE 1.98* 1.80*    Estimated Creatinine Clearance: 24.1 mL/min (A) (by C-G formula based on SCr of 1.8 mg/dL (H)).   Medical History: Past Medical History:  Diagnosis Date  . Adiposity 06/04/2011  . Adult hypothyroidism 06/04/2011  . Aortic insufficiency    a. severe by echo, TEE 2019.  Marland Kitchen. 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 Bradycardia    a. 09/2017 -> required cessation of metoprolol, decrease in amiodarone.  Marland Kitchen. CAD (coronary artery disease)    [facility], nonobstructive CAD (50-60% prox-mid Cx, 40% prox-mid LAD, 30% prox RCA), normal PA pressures, no L-R shunt, LVEDP 18mmHg, EF 50%..  . Cervix abnormality 1993   MILD ATYPICAL ADENOMATOUS HYPERPLASIA OF CERVIX  . Chronic diastolic CHF (congestive heart failure) (HCC) 08/29/2014  . Chronic respiratory failure with hypercapnia (HCC) 07/06/2012   Followed in Pulmonary clinic/ [facility]  - 05/2012 > [facility]lpm [facility] with HCO3 33-35 on bmets c/w hypercarbia. - PSS 08/29/12 Severe obstructive sleep apnea/hypopnea syndrome, with an AHI of 79 > see OSA - Placed on 24h 02 3lpm at d/c 09/04/14 - stopped 12/2014     . Dermatitis 05-26-12   all over  since 9'13  . Difficult intubation    will plan on spinal anesthesia"small  mouth/airway"  . Dyspnea   . Edema of both legs    a. right greater than left, due to Turner's syndrome.  . Gagging episode   . H/O total hip arthroplasty 06/04/2011  . Hyperglycemia 12/23/2011  . Hypertension   . Hypothyroidism   . LAD (lymphadenopathy), mediastinal 08/30/2014  . Obesity 09/16/2014  . Orbital floor (blow-out) closed fracture (HCC) 08/17/2011  . OSA (obstructive sleep apnea) 09/10/2012   NPSG 08/29/12:  Severe obstructive sleep apnea/hypopnea syndrome, with an AHI of 79  06/2016 Bipap 21/17  . Osteoarthritis 10/16/2010   Overview:  Dr. Charlann Boxerlin (hips and knees)   . PAF (paroxysmal atrial fibrillation) (HCC)   . Periorbital edema   . 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 Severe aortic insufficiency   . Speech disorder    occ. "stutter"  . Supraventricular tachycardia (HCC) 08/27/2014  . Turner syndrome 06/04/2011  . Turner's syndrome   . VSD (ventricular septal defect)    a. ? by TEE 06/2017, not seen on cardiac CT [facility]     Assessment: 61yof with Hx Afib on apixaban admitted with SOB now on BiPAP and NPO.  Last dose apixaban last pm.  When PICC line placed, begin heparin drip.  Will use Aptt to titrate heparin until HL correlates.    Goal of Therapy:  aptt 66-102sec Heparin level 0.3-0.7 units/ml Monitor platelets by anticoagulation protocol: Yes   Plan:  Start heparin drip 700 uts/hr  aptt 6hr after 6hr Daily HL, CBC  Leota Sauers Pharm.D. CPP, BCPS Clinical Pharmacist [phone] 09/05/2017 4:17 PM

## 2017-09-28 ENCOUNTER — Ambulatory Visit: Payer: Medicaid Other | Admitting: Cardiovascular Disease

## 2017-09-28 ENCOUNTER — Inpatient Hospital Stay (HOSPITAL_COMMUNITY): Payer: Medicaid Other

## 2017-09-28 DIAGNOSIS — J9601 Acute respiratory failure with hypoxia: Secondary | ICD-10-CM

## 2017-09-28 DIAGNOSIS — I959 Hypotension, unspecified: Secondary | ICD-10-CM

## 2017-09-28 LAB — POCT ACTIVATED CLOTTING TIME
ACTIVATED CLOTTING TIME: 285 s
ACTIVATED CLOTTING TIME: 213 s
ACTIVATED CLOTTING TIME: 191 s
ACTIVATED CLOTTING TIME: 180 s
Activated Clotting Time: 197 seconds
Activated Clotting Time: 191 seconds
Activated Clotting Time: 208 seconds
Activated Clotting Time: 180 seconds
Activated Clotting Time: 197 seconds
Activated Clotting Time: 197 seconds
Activated Clotting Time: 175 seconds
Activated Clotting Time: 191 seconds
Activated Clotting Time: 191 seconds

## 2017-09-28 LAB — RENAL FUNCTION PANEL
ALBUMIN: 2.7 g/dL — AB (ref 3.5–5.0)
Albumin: 2.8 g/dL — ABNORMAL LOW (ref 3.5–5.0)
Anion gap: 14 (ref 5–15)
Anion gap: 11 (ref 5–15)
BUN: 21 mg/dL (ref 8–23)
BUN: 6 mg/dL — AB (ref 8–23)
CO2: 21 mmol/L — ABNORMAL LOW (ref 22–32)
CO2: 21 mmol/L — ABNORMAL LOW (ref 22–32)
CREATININE: 0.91 mg/dL (ref 0.44–1.00)
Calcium: 7.3 mg/dL — ABNORMAL LOW (ref 8.9–10.3)
Calcium: 7.3 mg/dL — ABNORMAL LOW (ref 8.9–10.3)
Chloride: 100 mmol/L (ref 98–111)
Chloride: 103 mmol/L (ref 98–111)
Creatinine, Ser: 1.57 mg/dL — ABNORMAL HIGH (ref 0.44–1.00)
GFR calc Af Amer: 40 mL/min — ABNORMAL LOW (ref >60)
GFR calc Af Amer: >60 mL/min (ref >60)
GFR calc non Af Amer: 34 mL/min — ABNORMAL LOW (ref >60)
GFR calc non Af Amer: >60 mL/min (ref >60)
GLUCOSE: 183 mg/dL — AB (ref 70–99)
Glucose, Bld: 77 mg/dL (ref 70–99)
PHOSPHORUS: 4.9 mg/dL — AB (ref 2.5–4.6)
PHOSPHORUS: 2.5 mg/dL (ref 2.5–4.6)
POTASSIUM: 3.7 mmol/L (ref 3.5–5.1)
POTASSIUM: 5.2 mmol/L — AB (ref 3.5–5.1)
Sodium: 135 mmol/L (ref 135–145)
Sodium: 135 mmol/L (ref 135–145)

## 2017-09-28 LAB — CBC
HEMATOCRIT: 44.8 % (ref 36.0–46.0)
Hemoglobin: 14.5 g/dL (ref 12.0–15.0)
MCH: 32.8 pg (ref 26.0–34.0)
MCHC: 32.4 g/dL (ref 30.0–36.0)
MCV: 101.4 fL — AB (ref 78.0–100.0)
Platelets: 204 10*3/uL (ref 150–400)
RBC: 4.42 MIL/uL (ref 3.87–5.11)
RDW: 13.8 % (ref 11.5–15.5)
WBC: 25.3 10*3/uL — ABNORMAL HIGH (ref 4.0–10.5)

## 2017-09-28 LAB — GLUCOSE, CAPILLARY
GLUCOSE-CAPILLARY: 190 mg/dL — AB (ref 70–99)
Glucose-Capillary: 103 mg/dL — ABNORMAL HIGH (ref 70–99)
Glucose-Capillary: 126 mg/dL — ABNORMAL HIGH (ref 70–99)
Glucose-Capillary: 210 mg/dL — ABNORMAL HIGH (ref 70–99)
Glucose-Capillary: 240 mg/dL — ABNORMAL HIGH (ref 70–99)
Glucose-Capillary: 314 mg/dL — ABNORMAL HIGH (ref 70–99)
Glucose-Capillary: 86 mg/dL (ref 70–99)
Glucose-Capillary: 95 mg/dL (ref 70–99)

## 2017-09-28 LAB — COOXEMETRY PANEL
CARBOXYHEMOGLOBIN: 1.2 % (ref 0.5–1.5)
Methemoglobin: 1.3 % (ref 0.0–1.5)
O2 SAT: 75.1 %
TOTAL HEMOGLOBIN: 14.9 g/dL (ref 12.0–16.0)

## 2017-09-28 LAB — POCT I-STAT 3, ART BLOOD GAS (G3+)
Acid-Base Excess: 2 mmol/L (ref 0.0–2.0)
Acid-base deficit: 3 mmol/L — ABNORMAL HIGH (ref 0.0–2.0)
BICARBONATE: 27.3 mmol/L (ref 20.0–28.0)
Bicarbonate: 20.1 mmol/L (ref 20.0–28.0)
O2 SAT: 99 %
O2 SAT: 97 %
PCO2 ART: 31.6 mmHg — AB (ref 32.0–48.0)
PCO2 ART: 42.9 mmHg (ref 32.0–48.0)
PH ART: 7.412 (ref 7.350–7.450)
PO2 ART: 120 mmHg — AB (ref 83.0–108.0)
PO2 ART: 96 mmHg (ref 83.0–108.0)
Patient temperature: 98.6
TCO2: 21 mmol/L — ABNORMAL LOW (ref 22–32)
TCO2: 29 mmol/L (ref 22–32)
pH, Arterial: 7.412 (ref 7.350–7.450)

## 2017-09-28 LAB — MAGNESIUM: Magnesium: 2.5 mg/dL — ABNORMAL HIGH (ref 1.7–2.4)

## 2017-09-28 LAB — APTT
APTT: 71 s — AB (ref 24–36)
aPTT: 118 seconds — ABNORMAL HIGH (ref 24–36)

## 2017-09-28 LAB — ECHOCARDIOGRAM COMPLETE: WEIGHTICAEL: 2194.02 [oz_av]

## 2017-09-28 LAB — HEPARIN LEVEL (UNFRACTIONATED)
HEPARIN UNFRACTIONATED: 2.16 [IU]/mL — AB (ref 0.30–0.70)
Heparin Unfractionated: >2.2 IU/mL — ABNORMAL HIGH (ref 0.30–0.70)
Heparin Unfractionated: >2.2 IU/mL — ABNORMAL HIGH (ref 0.30–0.70)

## 2017-09-28 LAB — LACTIC ACID, PLASMA: Lactic Acid, Venous: 3 mmol/L (ref 0.5–1.9)

## 2017-09-28 MED ORDER — HYDROCORTISONE NA SUCCINATE PF 100 MG IJ SOLR
50.0000 mg | Freq: Four times a day (QID) | INTRAMUSCULAR | Status: DC
  Administered 2017-09-28 – 2017-10-02 (×15): 50 mg via INTRAVENOUS
  Filled 2017-09-28 (×15): qty 2

## 2017-09-28 MED ORDER — PRISMASOL BGK 4/2.5 32-4-2.5 MEQ/L IV SOLN
INTRAVENOUS | Status: DC
  Administered 2017-09-28 – 2017-10-02 (×10): via INTRAVENOUS_CENTRAL
  Filled 2017-09-28 (×18): qty 5000

## 2017-09-28 MED ORDER — STERILE WATER FOR INJECTION IV SOLN
INTRAVENOUS | Status: DC
  Administered 2017-09-28 – 2017-09-29 (×2): via INTRAVENOUS
  Filled 2017-09-28 (×5): qty 850

## 2017-09-28 MED ORDER — PRISMASOL BGK 4/2.5 32-4-2.5 MEQ/L IV SOLN
INTRAVENOUS | Status: DC
  Administered 2017-09-28 – 2017-10-02 (×17): via INTRAVENOUS_CENTRAL
  Filled 2017-09-28 (×21): qty 5000

## 2017-09-28 MED ORDER — PHENYLEPHRINE HCL-NACL 10-0.9 MG/250ML-% IV SOLN
0.0000 ug/min | INTRAVENOUS | Status: DC
  Filled 2017-09-28: qty 250

## 2017-09-28 MED ORDER — SODIUM CHLORIDE 0.9 % IV BOLUS
250.0000 mL | Freq: Once | INTRAVENOUS | Status: AC
  Administered 2017-09-28: 250 mL via INTRAVENOUS

## 2017-09-28 MED ORDER — VITAL HIGH PROTEIN PO LIQD
1000.0000 mL | ORAL | Status: DC
  Administered 2017-09-29 – 2017-10-01 (×3): 1000 mL

## 2017-09-28 MED ORDER — PRISMASOL BGK 4/2.5 32-4-2.5 MEQ/L IV SOLN
INTRAVENOUS | Status: DC
  Administered 2017-09-28 – 2017-10-02 (×37): via INTRAVENOUS_CENTRAL
  Filled 2017-09-28 (×50): qty 5000

## 2017-09-28 MED ORDER — VASOPRESSIN 20 UNIT/ML IV SOLN
0.0300 [IU]/min | INTRAVENOUS | Status: DC
  Administered 2017-09-28 – 2017-10-02 (×5): 0.03 [IU]/min via INTRAVENOUS
  Filled 2017-09-28 (×6): qty 2

## 2017-09-28 MED ORDER — HEPARIN (PORCINE) IN NACL 100-0.45 UNIT/ML-% IJ SOLN: 350.0000 [IU]/h | INTRAMUSCULAR | Status: DC

## 2017-09-28 MED ORDER — PHENYLEPHRINE HCL-NACL 40-0.9 MG/250ML-% IV SOLN
0.0000 ug/min | INTRAVENOUS | Status: DC
  Administered 2017-09-28: 400 ug/min via INTRAVENOUS
  Administered 2017-09-28: 20 ug/min via INTRAVENOUS
  Administered 2017-09-28 – 2017-09-29 (×4): 400 ug/min via INTRAVENOUS
  Administered 2017-09-29: 390 ug/min via INTRAVENOUS
  Administered 2017-09-29 (×2): 400 ug/min via INTRAVENOUS
  Administered 2017-09-29: 300 ug/min via INTRAVENOUS
  Administered 2017-09-29 (×2): 400 ug/min via INTRAVENOUS
  Administered 2017-09-29: 300 ug/min via INTRAVENOUS
  Administered 2017-09-29 (×2): 400 ug/min via INTRAVENOUS
  Administered 2017-09-30 (×2): 250 ug/min via INTRAVENOUS
  Administered 2017-09-30: 300 ug/min via INTRAVENOUS
  Administered 2017-09-30: 250 ug/min via INTRAVENOUS
  Administered 2017-09-30: 160 ug/min via INTRAVENOUS
  Administered 2017-10-01: 10 ug/min via INTRAVENOUS
  Filled 2017-09-28 (×20): qty 250

## 2017-09-28 MED ORDER — B COMPLEX-C PO TABS
1.0000 | ORAL_TABLET | Freq: Every day | ORAL | Status: DC
  Administered 2017-09-30 – 2017-10-02 (×3): 1
  Filled 2017-09-28 (×5): qty 1

## 2017-09-28 NOTE — Progress Notes (Signed)
[facility] RN called unit and spoke with pt's nurse, Farley Lyachel RN. Advised that IV consult needed to be changed to PICC insertion order so it would show up on the PICC insertion list. Fleet ContrasRachel verbalized understanding.

## 2017-09-28 NOTE — Progress Notes (Signed)
Initial Nutrition Assessment  DOCUMENTATION CODES:   Not applicable  INTERVENTION:   Tube Feeding: once enteral access obtained (cortrak tomorrow vs. diagnostic radiology placement today) Vital High Protein @ 20 ml/hr Goal rate: Vital High Protein @ 45 ml/hr Provide 1080 kcals, 95 g of protein, 907 mL of free water  Add B-complex with Vitamin C  NUTRITION DIAGNOSIS:   Inadequate oral intake related to acute illness as evidenced by NPO status.  GOAL:   Patient will meet greater than or equal to 90% of their needs   MONITOR:   TF tolerance, Vent status, Labs, Weight trends  REASON FOR ASSESSMENT:   Ventilator    ASSESSMENT:   62 yo female admitted with shock-septic vs cardiogenic, acute on chronic CHF, AKI with persistent hyperkalemia and acidosis requiring CRRT. PMH includes Turner's syndrome, HTN  Noted palliative care consult has been ordered  Patient is currently intubated on ventilator support on levophed and vasopressin MV: 7.1 L/min Temp (24hrs), Avg:95.1 F (35.1 C), Min:93.7 F (34.3 C), Max:97.7 F (36.5 C)  CRRT continues Per RN, OG tube placement attempted several times but unsuccessful. Cortrak  consult placed. RD notified RN that Cortrak service not available today but Radiology could be consulted for tube placement today Noted MD note indicating plan to start trickle TF today  Pt's uncle at bedside. He indicates that pt has been gagging on food recently, GI has been evaluating pt for possible esophageal dilation.  Unsure of weight history but indicates appetite has been pretty good.   Current wt 62.2 kg; unsure of dry wt, noted pt with significant LE edema. Pt weighed 59.9 kg on admission. Uncle reports pt has chronic LE edema  Labs: potassium wdl, phosphorus 4.9, Creatinine 1.57, BUN wdl Meds: NS at 50 ml/hr  NUTRITION - FOCUSED PHYSICAL EXAM:    Most Recent Value  Orbital Region  No depletion  Upper Arm Region  No depletion  Thoracic and  Lumbar Region  No depletion  Buccal Region  No depletion  Temple Region  No depletion  Clavicle Bone Region  No depletion  Clavicle and Acromion Bone Region  No depletion  Scapular Bone Region  No depletion  Dorsal Hand  Unable to assess  Patellar Region  Unable to assess [severe edema]  Anterior Thigh Region  Unable to assess [severe edema]  Posterior Calf Region  Unable to assess [severe edema]  Edema (RD Assessment)  None  Hair  Reviewed  Eyes  Unable to assess  Mouth  Unable to assess  Skin  Reviewed [petichiae]  Nails  Unable to assess       Diet Order:   Diet Order            Diet NPO time specified  Diet effective now              EDUCATION NEEDS:   Not appropriate for education at this time  Skin:  Skin Assessment: Reviewed RN Assessment  Last BM:  no documented BM  Height:   Ht Readings from Last 1 Encounters:  09/15/17 4\' 8"  (1.422 m)    Weight:   Wt Readings from Last 1 Encounters:  09/28/17 62.2 kg    Ideal Body Weight:     BMI:  Body mass index is 30.74 kg/m.  Estimated Nutritional Needs:   Kcal:  1089 kcals   Protein:  90-120 g   Fluid:  >/= 1.2 L   Romelle Starcherate Mally Gavina MS, RD, LDN, CNSC [phone] Pager  [phone] Weekend/On-Call  Pager

## 2017-09-28 NOTE — Significant Event (Addendum)
Significant Event - PCCM   Pt hypotensive called to bedside by Elink to discuss GOC with family Decision maker is patient's Mother who is present along with her brothers and other family members.   I, Dr Newell CoralKristen Scatliffe have personally reviewed patient's available data, including medical history, events of note, physical examination and test results as part of my evaluation. I have discussed [facility] Dr Belia HemanKasa.  In addition,  I personally evaluated patient  62 yr old female with PMHx significant for Turner's syndrome currently in multiorgan dysfunction currently on CRRT, intubated on mechanical ventilation, continuing on vasopressors (including Neosynephrine ggt, levophed ggt and Vasopressin) despite these interventions her current issues include (in no particular order):  1.  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  Acute Renal failure with severe metabolic acidosis 3.  Refractory shock 4.  Severe Aortic Insufficieny in the context of Turner syndrome 5.  Atrial Fibrillation RVR on Amiodarone 6.  Acute Hypoxic Respiratory failure on Mechanical ventilation 7.  Acute metabolic encephalopathy: now opening eyes and tracking.  Her clinical condition is worsening. Her prognosis is poor. I explained in clear terms that the patient despite our best efforts is requiring additional medication to keep her blood pressures up.  We discussed the current clinical interventions being provided and her current clinical condition.  I explained that it is likeIy the patient will code tonight. I also explained that I do not believe that more pressors and interventions are going to reverse her current clinical condition  I asked what the patient would want in the event that her heart should stop.  Mother is main caretaker and has a deep attachment to her daughter she continued to repeat " I can not imagine life without her."  She stated that she wanted the patient to  remain a full code regardless of possible outcomes.  I answered all questions that the family had to the best of my ability and clarified when needed.    CC TIME: 45 mins CODE STATUS: remains a FULL CODE ADDITIONAL INTERVENTIONS: Bicarb ggt and stress dose steroids [facility]. No additional pressors will be added. Now that pt is opening eyes and responding to painful stimuli prn fentanyl added as BP allows. FAMILY: Mom is NOK there are uncles and other family members present for support. All have been updated on patient's current clinical status   informed the RN caring for the patient of the plan of care and she was present for the entire conversation with the family.   The patient is critically ill with multiple organ systems failure and requires high complexity decision making for assessment and support, frequent evaluation and titration of therapies, application of advanced monitoring technologies and extensive interpretation of multiple databases.   Critical Care Time devoted to patient care services described in this note is 45  Minutes. This time reflects time of care of this signee Dr Newell CoralKristen Scatliffe. This critical care time does not reflect procedure time, or teaching time or supervisory time  but does nvolve care discussion time    Dr. Newell CoralKristen Scatliffe Pulmonary Critical Care Medicine  09/28/2017 8:20 PM

## 2017-09-28 NOTE — Progress Notes (Signed)
ANTICOAGULATION CONSULT NOTE - Follow Up Consult  Pharmacy Consult for heparin Indication: atrial fibrillation  Labs: Recent Labs    Jun 30, 2017 0810 Jun 30, 2017 0819 Jun 30, 2017 1544 Jun 30, 2017 1821 09/28/17 0315  HGB 15.9* 17.7*  --   --  14.5  HCT 52.0* 52.0*  --   --  44.8  PLT 325  --   --   --  204  APTT  --   --   --   --  >200*  HEPARINUNFRC  --   --   --   --  >2.20*  CREATININE 1.98* 1.80* 2.20*  --  1.57*  CKTOTAL  --   --   --  257*  --     Assessment: 61yo female supratherapeutic on heparin with initial dosing while Eliquis on hold; if note pt had been getting heparin w/ CRRT though this was stopped just prior to lab draw.  Goal of Therapy:  aPTT 66-102 seconds   Plan:  Will hold heparin gtt x1hr then decrease heparin gtt by 4 units/kg/hr to 500 units/hr and check PTT in 6 hours.    Vernard GamblesVeronda Kelsa Jaworowski, PharmD, BCPS  09/28/2017,4:34 AM

## 2017-09-28 NOTE — Progress Notes (Signed)
Fluid warmer also added to maintenance fluid around 6:30am. Temp is now 34.6 degrees celsius.

## 2017-09-28 NOTE — Progress Notes (Signed)
eLink Physician-Brief Progress Note Patient Name: [name] DOB: 07-18-1955 MRN: [id number]   Date of Service  09/28/2017  HPI/Events of Note  Patient is in dying process on 3 pressors, family requests MD as bedside for discussion, they do not want to talk with elink MD at this time   eICU Interventions  LEVO infusion max to 400 if needed BiCarb infusion started for suspected acidosis Prognosis is poor Patient likely to code tonight Patient on full support and maximum therapy      Intervention Category Major Interventions: Shock - evaluation and management  Yosgart Pavey 09/28/2017, 7:42 PM

## 2017-09-28 NOTE — Progress Notes (Signed)
CRRT had a difficult start time due to filter clotting several times. Filter had to be changed 3 times and machine was replaced. New equipment is working smoothly. Patient has been having low core temp readings of about 34.7 degrees celsius since CRRT has been in progress.The blood warmer on CRRT is being used, currently on a bair hugger, several warm blankets, thermostat in the room was also increased. Pt is responsive to pain and makes purposeful movement throughout the night. Only gave fentanyl twice to maintain rass goal.  Will continue to monitor.

## 2017-09-28 NOTE — Progress Notes (Signed)
[facility] ROUNDING NOTE   Subjective:   Acute renal failure with hypotension , lactic acidosis and history of atrial fibrillation with RVR  Cardioversion 09/15/17   Patient has Turners syndrome VSD  SVT and Aortic insufficiency and hypothyroidism She has been started on CRRT and is tolerating this well so far. RHC/LHC 07/20/17 >> EF 50%, severe aortic regurgitation, non obstructive CAD, normal PA pressures, no shunt Discussed with mother and aunt in room and although this may be too early to tell, the prognosis is generally not good   Objective:  Vital signs in last 24 hours:  Temp:  [93.7 F (34.3 C)-97.7 F (36.5 C)] 95.5 F (35.3 C) (09/24 1100) Pulse Rate:  [31-105] 53 (09/24 1000) Resp:  [17-36] 22 (09/24 1100) BP: (81-147)/(44-128) 93/69 (09/24 1100) SpO2:  [80 %-100 %] 99 % (09/24 1000) Arterial Line BP: (83-136)/(51-74) 92/58 (09/24 1100) FiO2 (%):  [40 %-100 %] 40 % (09/24 0756) Weight:  [59.9 kg-62.2 kg] 62.2 kg (09/24 0500)  Weight change:  Filed Weights   09/29/2017 1400 09/28/17 0500  Weight: 59.9 kg 62.2 kg    Intake/Output: I/O last 3 completed shifts: In: 1836.6 [I.V.:1626.7; IV Piggyback:209.9] Out: 450 [Urine:18; Other:280; Blood:152]   Intake/Output this shift:  Total I/O In: 400 [I.V.:400] Out: 443 [Urine:20; Other:423]  Thick webbed neck  CVS- RRR RS- CTA ABD- BS present soft non-distended EXT- no edema   Basic Metabolic Panel: Recent Labs  Lab 10/04/2017 0810 09/23/2017 0819 09/16/2017 1544 09/28/17 0315  NA 132* 130* 136 135  K 5.9* 5.6* 6.8* 3.7  CL 93* 94* 95* 100  CO2 17*  --  19* 21*  GLUCOSE 194* 193* 191* 183*  BUN 27* 34* 28* 21  CREATININE 1.98* 1.80* 2.20* 1.57*  CALCIUM 9.3  --  8.9 7.3*  MG 2.6*  --   --  2.5*  PHOS 7.5*  --   --  4.9*    Liver Function Tests: Recent Labs  Lab 10/03/2017 0810 09/28/17 0315  AST 216*  --   ALT 223*  --   ALKPHOS 105  --   BILITOT 3.5*  --   PROT 5.8*  --   ALBUMIN 3.4*  2.8*   No results for input(s): LIPASE, AMYLASE in the last 168 hours. No results for input(s): AMMONIA in the last 168 hours.  CBC: Recent Labs  Lab 09/05/2017 0810 09/26/2017 0819 09/28/17 0315  WBC 19.0*  --  25.3*  NEUTROABS 14.8*  --   --   HGB 15.9* 17.7* 14.5  HCT 52.0* 52.0* 44.8  MCV 106.8*  --  101.4*  PLT 325  --  204    Cardiac Enzymes: Recent Labs  Lab 09/09/2017 1821  CKTOTAL 257*    BNP: Invalid input(s): POCBNP  CBG: Recent Labs  Lab 09/15/2017 2022 09/20/2017 2106 09/30/2017 2358 09/28/17 0032 09/28/17 0332  GLUCAP 276* 314* 240* 210* 190*    Microbiology: Results for orders placed or performed during the hospital encounter of 09/06/2017  Culture, blood (routine x 2)     Status: None (Preliminary result)   Collection Time: 09/26/2017  8:56 AM  Result Value Ref Range Status   Specimen Description BLOOD RIGHT HAND  Final   Special Requests   Final    BOTTLES DRAWN AEROBIC ONLY Blood Culture results may not be optimal due to an inadequate volume of blood received in culture bottles   Culture   Final    NO GROWTH < 24 HOURS Performed at [facility], [place] [address] [place], [place] [address]    Report Status PENDING  Incomplete  Culture, blood (routine x 2)     Status: None (Preliminary result)   Collection Time: 09/07/2017  9:10 AM  Result Value Ref Range Status   Specimen Description BLOOD RIGHT ANTECUBITAL  Final   Special Requests   Final    BOTTLES DRAWN AEROBIC AND ANAEROBIC Blood Culture adequate volume   Culture   Final    NO GROWTH < 24 HOURS Performed at [facility], [place] [address], [place],  [id number]    Report Status PENDING  Incomplete    Coagulation Studies: No results for input(s): LABPROT, INR in the last 72 hours.  Urinalysis: Recent Labs    09/07/2017 1816  COLORURINE AMBER*  LABSPEC 1.021  PHURINE 5.0  GLUCOSEU 150*  HGBUR LARGE*  BILIRUBINUR NEGATIVE  KETONESUR 5*  PROTEINUR 100*  NITRITE NEGATIVE   LEUKOCYTESUR MODERATE*      Imaging: US Renal  Result Date: 10/01/2017 CLINICAL DATA:  Acute renal failure EXAM: RENAL / URINARY TRACT ULTRASOUND COMPLETE COMPARISON:  None. FINDINGS: Right Kidney: Length: 10.7 cm. Echogenicity within normal limits. No mass or hydronephrosis visualized. Left Kidney: Length: 9.7 cm. Echogenicity within normal limits. No mass or hydronephrosis visualized. Bladder: Decompressed and poorly visualized. IMPRESSION: No sonographic findings to explain the patient's acute renal failure. Cortical-medullary distinction is maintained without evidence of medical renal disease. No obstructive uropathy is noted. Electronically Signed   By: Ashley Royalty M.D.   On: 09/17/2017 23:18   Dg Chest Port 1 View  Result Date: 09/28/2017 CLINICAL DATA:  Intubation.  Hypoxia. EXAM: PORTABLE CHEST 1 VIEW COMPARISON:  09/09/2017. FINDINGS: Endotracheal tube and left IJ line in stable position. Cardiomegaly with diffuse bilateral pulmonary infiltrates and right pleural effusion. Findings consistent with CHF. Findings have worsened from prior exam. Bilateral pneumonia cannot be excluded. Low lung volumes. No pneumothorax. IMPRESSION: 1.  Lines and tubes stable position. 2. Cardiomegaly with diffuse bilateral pulmonary infiltrates and prominent right pleural effusion. Findings consistent with CHF. Findings have worsened from prior exam. Bilateral pneumonia cannot be excluded. Low lung volumes. Electronically Signed   By: Marcello Moores  Register   On: 09/28/2017 10:24   Dg Chest Port 1 View  Result Date: 09/13/2017 CLINICAL DATA:  LEFT IJ central line placement. EXAM: PORTABLE CHEST 1 VIEW COMPARISON:  Chest x-ray from earlier same day. FINDINGS: LEFT IJ central line in place with tip at the level of the upper SVC. Endotracheal tube appears well positioned with tip just above the level of the carina. Pacer pad overlies the upper mediastinum. There is stable cardiomegaly with central pulmonary vascular  congestion. Bibasilar opacities are stable, likely small pleural effusions and/or atelectasis. No pneumothorax seen IMPRESSION: 1. LEFT IJ central line in place with tip positioned at the level of the upper SVC. No pneumothorax seen. 2. Endotracheal tube appears well positioned with tip just above the level of the carina. 3. Cardiomegaly with central pulmonary vascular congestion indicating mild CHF/volume overload. 4. Probable small bilateral pleural effusions and/or atelectasis. Electronically Signed   By: Franki Cabot M.D.   On: 09/24/2017 19:54   Dg Chest Portable 1 View  Result Date: 09/30/2017 CLINICAL DATA:  Ventricular tachycardia.  Hypertension EXAM: PORTABLE CHEST 1 VIEW COMPARISON:  September 10, 2016 FINDINGS: Monitoring devices overlie portions of the chest. There is no demonstrable edema or consolidation. There are minimal pleural effusions bilaterally. There is cardiomegaly with pulmonary vascularity normal. No  adenopathy. There is aortic atherosclerosis. No evident bone lesions. There is calcification in the left carotid artery. IMPRESSION: Minimal pleural effusions bilaterally. No frank edema or consolidation. Stable cardiomegaly. There is aortic atherosclerosis as well as calcification in the left carotid artery. Aortic Atherosclerosis (ICD10-I70.0). Electronically Signed   By: Lowella Grip III M.D.   On: 09/13/2017 08:26   [place] Ekg Site Rite  Result Date: 09/14/2017 If Site Rite image not attached, placement could not be confirmed due to current cardiac rhythm.    Medications:   . sodium chloride 50 mL/hr at 09/28/17 1100  . sodium chloride    . amiodarone 30 mg/hr (09/28/17 1100)  . famotidine (PEPCID) IV Stopped (09/28/17 0027)  . heparin 10,000 units/ 20 mL infusion syringe 250 Units/hr (09/28/17 1101)  . heparin 500 Units/hr (09/28/17 1100)  . levofloxacin (LEVAQUIN) IV Stopped (09/30/2017 1337)  . norepinephrine (LEVOPHED) Adult infusion 32 mcg/min (09/28/17 1100)  .  dialysis replacement fluid (prismasate) 700 mL/hr at 09/28/17 0729  . dialysis replacement fluid (prismasate) 800 mL/hr at 09/28/17 0729  . dialysate (PRISMASATE) 2,000 mL/hr at 09/28/17 1011  . sodium chloride    . vasopressin (PITRESSIN) infusion - *FOR SHOCK* 0.03 Units/min (09/28/17 1100)   . atorvastatin  20 mg Oral Daily  . chlorhexidine gluconate (MEDLINE KIT)  15 mL Mouth Rinse BID  . insulin aspart  0-9 Units Subcutaneous Q4H  . levothyroxine  37.5 mcg Intravenous Daily  . mouth rinse  15 mL Mouth Rinse 10 times per day   Place/Maintain arterial line **AND** sodium chloride, acetaminophen **OR** acetaminophen, dextrose, fentaNYL (SUBLIMAZE) injection, heparin, heparin, midazolam, ondansetron (ZOFRAN) IV, sodium chloride  Assessment/ Plan:   1. Acute renal failure in setting of shock - septic versus cardiogenic with severe aortic regurgitation  Consultation with cardiology. She continues on CRRT at this time   2. Hypotension  MAP 70's  Vasopressin norepinephrine with increased requirements  Trying to remove some fluid with dialysis 3. PAF  On heparin and amiodarone 4. Aortic regurgitation  Severe awaiting cardiology input 5.  Hyperkalemia resolved with CRRT 6. Metabolic acidosis resolved with CRRT 7. Hypothyroidism   Last TSH  8.27 8. Ventilator dependent respiratory failure   9. Empiric levoquin  Questioning UTI as cause     LOS: 1 Sherril Croon _0 _1 :34 AM

## 2017-09-28 NOTE — Progress Notes (Signed)
  Echocardiogram 2D Echocardiogram has been performed.  Janalyn HarderWest, Yoon Barca R 09/28/2017, 10:35 AM

## 2017-09-28 NOTE — Progress Notes (Signed)
Progress Note  Patient Name: [name] Date of Encounter: 09/28/2017  Primary Cardiologist:   Mertie Moores, MD   Subjective   Inbutabed and sedated.   Inpatient Medications    Scheduled Meds: . atorvastatin  20 mg Oral Daily  . chlorhexidine gluconate (MEDLINE KIT)  15 mL Mouth Rinse BID  . insulin aspart  0-9 Units Subcutaneous Q4H  . levothyroxine  37.5 mcg Intravenous Daily  . mouth rinse  15 mL Mouth Rinse 10 times per day   Continuous Infusions: . sodium chloride 50 mL/hr at 09/28/17 0800  . sodium chloride    . amiodarone 30 mg/hr (09/28/17 0800)  . famotidine (PEPCID) IV Stopped (09/28/17 0027)  . heparin 10,000 units/ 20 mL infusion syringe Stopped (09/28/17 0300)  . heparin 500 Units/hr (09/28/17 0800)  . levofloxacin (LEVAQUIN) IV Stopped (09/26/2017 1337)  . norepinephrine (LEVOPHED) Adult infusion 28 mcg/min (09/28/17 0800)  . dialysis replacement fluid (prismasate) 700 mL/hr at 09/28/17 0729  . dialysis replacement fluid (prismasate) 800 mL/hr at 09/28/17 0729  . dialysate (PRISMASATE) 2,000 mL/hr at 09/28/17 0729  . sodium chloride     PRN Meds: Place/Maintain arterial line **AND** sodium chloride, acetaminophen **OR** acetaminophen, dextrose, fentaNYL (SUBLIMAZE) injection, heparin, heparin, midazolam, ondansetron (ZOFRAN) IV, sodium chloride   Vital Signs    Vitals:   09/28/17 0600 09/28/17 0700 09/28/17 0756 09/28/17 0800  BP: 106/63 105/70 105/70 100/77  Pulse: 98 (!) 105 96 (!) 48  Resp: (!) 22 (!) 22 (!) 22 (!) 22  Temp: (!) 93.7 F (34.3 C) (!) 94.1 F (34.5 C)  (!) 94.5 F (34.7 C)  TempSrc: Core Core    SpO2: 99% 98% 99% 99%  Weight:        Intake/Output Summary (Last 24 hours) at 09/28/2017 0857 Last data filed at 09/28/2017 0810 Gross per 24 hour  Intake 1933.73 ml  Output 561 ml  Net 1372.73 ml   Filed Weights   09/25/2017 1400 09/28/17 0500  Weight: 59.9 kg 62.2 kg    Telemetry    NSR, sinus tach, atrial tach and PACs -  Personally Reviewed  ECG    NA - Personally Reviewed  Physical Exam   GEN: No acute distress.   Neck:   Unable to assess JVD Cardiac: RRR, no murmurs (distant heart sounds), rubs, or gallops.  Respiratory: Decreased breath sounds GI: Soft, nontender, non-distended  MS:   Mild edema; No deformity. Neuro:  intubated and sedated.    Labs    Chemistry Recent Labs  Lab 09/10/2017 0810 09/24/2017 0819 09/26/2017 1544 09/28/17 0315  NA 132* 130* 136 135  K 5.9* 5.6* 6.8* 3.7  CL 93* 94* 95* 100  CO2 17*  --  19* 21*  GLUCOSE 194* 193* 191* 183*  BUN 27* 34* 28* 21  CREATININE 1.98* 1.80* 2.20* 1.57*  CALCIUM 9.3  --  8.9 7.3*  PROT 5.8*  --   --   --   ALBUMIN 3.4*  --   --  2.8*  AST 216*  --   --   --   ALT 223*  --   --   --   ALKPHOS 105  --   --   --   BILITOT 3.5*  --   --   --   GFRNONAA 26*  --  23* 34*  GFRAA 30*  --  26* 40*  ANIONGAP 22*  --  22* 14     Hematology Recent Labs  Lab 09/23/2017 [phone]  09/19/2017 0819 09/28/17 0315  WBC 19.0*  --  25.3*  RBC 4.87  --  4.42  HGB 15.9* 17.7* 14.5  HCT 52.0* 52.0* 44.8  MCV 106.8*  --  101.4*  MCH 32.6  --  32.8  MCHC 30.6  --  32.4  RDW 13.9  --  13.8  PLT 325  --  204    Cardiac EnzymesNo results for input(s): TROPONINI in the last 168 hours.  Recent Labs  Lab 09/07/2017 0818  TROPIPOC 2.16*     BNP Recent Labs  Lab 09/19/2017 0810  BNP >4,500.0*     DDimer No results for input(s): DDIMER in the last 168 hours.   Radiology    US Renal  Result Date: 09/17/2017 CLINICAL DATA:  Acute renal failure EXAM: RENAL / URINARY TRACT ULTRASOUND COMPLETE COMPARISON:  None. FINDINGS: Right Kidney: Length: 10.7 cm. Echogenicity within normal limits. No mass or hydronephrosis visualized. Left Kidney: Length: 9.7 cm. Echogenicity within normal limits. No mass or hydronephrosis visualized. Bladder: Decompressed and poorly visualized. IMPRESSION: No sonographic findings to explain the patient's acute renal failure.  Cortical-medullary distinction is maintained without evidence of medical renal disease. No obstructive uropathy is noted. Electronically Signed   By: Ashley Royalty M.D.   On: 09/18/2017 23:18   Dg Chest Port 1 View  Result Date: 09/11/2017 CLINICAL DATA:  LEFT IJ central line placement. EXAM: PORTABLE CHEST 1 VIEW COMPARISON:  Chest x-ray from earlier same day. FINDINGS: LEFT IJ central line in place with tip at the level of the upper SVC. Endotracheal tube appears well positioned with tip just above the level of the carina. Pacer pad overlies the upper mediastinum. There is stable cardiomegaly with central pulmonary vascular congestion. Bibasilar opacities are stable, likely small pleural effusions and/or atelectasis. No pneumothorax seen IMPRESSION: 1. LEFT IJ central line in place with tip positioned at the level of the upper SVC. No pneumothorax seen. 2. Endotracheal tube appears well positioned with tip just above the level of the carina. 3. Cardiomegaly with central pulmonary vascular congestion indicating mild CHF/volume overload. 4. Probable small bilateral pleural effusions and/or atelectasis. Electronically Signed   By: Franki Cabot M.D.   On: 09/30/2017 19:54   Dg Chest Portable 1 View  Result Date: 10/04/2017 CLINICAL DATA:  Ventricular tachycardia.  Hypertension EXAM: PORTABLE CHEST 1 VIEW COMPARISON:  September 10, 2016 FINDINGS: Monitoring devices overlie portions of the chest. There is no demonstrable edema or consolidation. There are minimal pleural effusions bilaterally. There is cardiomegaly with pulmonary vascularity normal. No adenopathy. There is aortic atherosclerosis. No evident bone lesions. There is calcification in the left carotid artery. IMPRESSION: Minimal pleural effusions bilaterally. No frank edema or consolidation. Stable cardiomegaly. There is aortic atherosclerosis as well as calcification in the left carotid artery. Aortic Atherosclerosis (ICD10-I70.0). Electronically  Signed   By: Lowella Grip III M.D.   On: 09/26/2017 08:26   [place] Ekg Site Rite  Result Date: 09/09/2017 If Site Rite image not attached, placement could not be confirmed due to current cardiac rhythm.   Cardiac Studies   Beside echo with reduced EF compared with recent previous.  Full echo pending.   Patient Profile     62 y.o. female with a hx of mod-severe AR/AI, D-CHF, OSA on BiPAP, Turner's syndrome, persistent A fib,  who is being seen for the evaluation of WCT at the request of Dr Halford Chessman.  Assessment & Plan    AKI:  Now on CRRT.  [place] of kidneys OK.  Blood and urine cultures are pending.  Being treated for presumptive UTI and sepsis.  Continue supportive care.    VT:  In the face of this acute illness.  Eventual review with EP but currently maintaining NSR.  Continue IV amiodarone.    RESPIRATORY FAILURE:   Echo pending.  CRRT currently keeping the volume even.  Cardiogenic vs septic.  Unable to get Coox or CVP at this point.  Will discuss with CCM.      For questions or updates, please contact [place] Please consult [email or web address] for contact info under Cardiology/STEMI.   Signed, Minus Breeding, MD  09/28/2017, 8:57 AM

## 2017-09-28 NOTE — Consult Note (Addendum)
Cardiology Consultation:   Patient ID: KAIULANI SITTON MRN: [id number]; DOB: 10/16/1955  Admit date: 09/08/2017 Date of Consult: 09/28/2017  Primary Care Provider: Bernerd Limbo, MD Primary Cardiologist: Mertie Moores, MD  Primary Electrophysiologist:  None    Patient Profile:   [name] is a 62 y.o. female with a hx of Turner Syndrome, known VHD w/AI, chronic CHF (diastolic), persistent AFib, OSA w/BIPAP who is being seen today for the evaluation of VT at the request of Dr. Rayann Heman.  History of Present Illness:   [name] was recently hospitalized 08/2017 with rapid atrial fibrillation. She initially was treated with cardizem but then transitioned back to home dose of metoprolol. She was also found to be hyperthyroid so levothyroxine was adjusted, Lasix and spironolactone were held due to hypotension, and Lasix was resumed PRN at DC. Per phone notes, due to volume overload, Lasix was restarted scheduled along with spironolactone. She was started on amiodarone 470m BID and set up for OP DCCV on 09/15/17.   Re-hospitalized, 09/15/17 with symptomatic bradycardia s/p DCCV, she was in sinus rhythm and PACs with some atrial bigeminy.  Her heart rate was in the 60s.  However, she then developed sinus bradycardia to the 30s.  She was hypotensive to the 60s over 40s and required IV fluid and pushes of phenylephrine.  Her heart rate improved with calcium chloride 1 g IV x2.  She was then started on a calcium gluconate drip and admitted to the ICU, her metoprolol held, her amiodarone down-titrated.  Discharged 09/16/17 with plans for close f/u labs.  In review of record (pt intubated/sedated) Patient's mother reported she was feeling well, in-fact quite well Friday, though Saturday began feeling SOB, more weak, Sunday with poor intake, more SOB, easily winded, with symptom sthat sounded of orthopnea/PND, yesterday morning more so, calling EMS.  EMS record reviewed, on arrival found in wide complex  tachycardia with pulse, alert, 1202/82, patient developed nausea/retching no vomiting, given IV amiodarone and zofran with improvement in symptoms.  The patient on arrival to the ED was in SViolet notes report comfortable on BIPAP, no reports of CP, no fevers/illness of late, complained of being thirsty.  In the ER, Initial eval w/ K+ 5.9, BNP 3020, poc Trop 2.16, lactic acid 10.38, WBC 19. ECG w/ RBBB.  She subsequently decompensated further w/MSOF requiring intubation, pressor support, CRRT ON: amio gtt Heparin gtt Levophed gtt Vasopressin gtt Neosynepherine gtt  Labs today K+ 3.7 BUN/Crteat 21/1.57 Mag 2.5 WBC 25.3 H/H 14/44 plts 204  presenting labs K+ 5.9 BUN/Creat 27/1.98 Mag 2.6 AST 216 ALT 223 BNP >4500 Lactic acid 10.38 WBC 19.0   Past Medical History:  Diagnosis Date  . Adiposity 06/04/2011  . Adult hypothyroidism 06/04/2011  . Aortic insufficiency    a. severe by echo, TEE 2019.  .Marland KitchenBenign hypertensive heart disease without heart failure 10/16/2010  . Bradycardia    a. 09/2017 -> required cessation of metoprolol, decrease in amiodarone.  .Marland KitchenCAD (coronary artery disease)    a.  [facility], nonobstructive CAD (50-60% prox-mid Cx, 40% prox-mid LAD, 30% prox RCA), normal PA pressures, no L-R shunt, LVEDP 150mg, EF 50%..  . Cervix abnormality 1993   MILD ATYPICAL ADENOMATOUS HYPERPLASIA OF CERVIX  . Chronic diastolic CHF (congestive heart failure) [place]  . [place] with [place]capnia ([facility]2012   [place]- 05/2012 > [facility] b[facility]ets c/w hypercarbia. -  PSS 08/29/12 Severe obstructive sleep apnea/hypopnea syndrome, with an AHI of 79 > see OSA - Placed on 24h 02 3lpm at d/c 09/04/14 - stopped 12/2014     . Dermatitis 05-26-12   all over  since 9'13  . Difficult intubation    will plan on spinal anesthesia"small mouth/airway"  . Dyspnea   . Edema of both  legs    a. right greater than left, due to Turner's syndrome.  . Gagging episode   . H/O total hip arthroplasty 06/04/2011  . Hyperglycemia 12/23/2011  . Hypertension   . Hypothyroidism   . LAD (lymphadenopathy), mediastinal 08/30/2014  . Obesity 09/16/2014  . Orbital floor (blow-out) closed fracture ([place]) 08/17/2011  . OSA (obstructive sleep apnea) 09/10/2012   NPSG 08/29/12:  Severe obstructive sleep apnea/hypopnea syndrome, with an AHI of 79  06/2016 Bipap 21/17  . Osteoarthritis 10/16/2010   Overview:  Dr. Alvan Dame (hips and knees)   . PAF (paroxysmal atrial fibrillation) ([place])   . Periorbital edema   . 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 Severe aortic insufficiency   . Speech disorder    occ. "stutter"  . Supraventricular tachycardia ([place]) 08/27/2014  . Turner syndrome 06/04/2011  . Turner's syndrome   . VSD (ventricular septal defect)    a. ? by TEE 06/2017, not seen on cardiac CT and no shunt [facility].    Past Surgical History:  Procedure Laterality Date  . CARDIOVERSION  06/14/2017   Procedure: CARDIOVERSION;  Surgeon: Acie Fredrickson Wonda Cheng, MD;  Location: [place];  Service: Cardiovascular;;  . CARDIOVERSION N/A 09/15/2017   Procedure: CARDIOVERSION;  Surgeon: Skeet Latch, MD;  Location: [place];  Service: Cardiovascular;  Laterality: N/A;  . [place]  . COLPOSCOPY    . DILATION AND CURETTAGE OF UTERUS  1993   CONE BIOPSY  . HERNIA REPAIR    . JOINT REPLACEMENT     hip replacement  . RIGHT/LEFT HEART CATH AND CORONARY ANGIOGRAPHY N/A 07/20/2017   Procedure: RIGHT/LEFT HEART CATH AND CORONARY ANGIOGRAPHY;  Surgeon: Belva Crome, MD;  Location: [place] CV LAB;  Service: Cardiovascular;  Laterality: N/A;  . TEE WITHOUT CARDIOVERSION N/A 06/14/2017   Procedure: TRANSESOPHAGEAL ECHOCARDIOGRAM (TEE);  Surgeon: Acie Fredrickson Wonda Cheng, MD;  [facility];  Service: Cardiovascular;  Laterality: N/A;  . TOTAL HIP ARTHROPLASTY     LEFT  . TOTAL KNEE ARTHROPLASTY  Right 05/31/2012   Procedure: RIGHT TOTAL KNEE ARTHROPLASTY;  Surgeon: Mauri Pole, MD;  [facility];  Service: Orthopedics;  Laterality: Right;  . ULTRASOUND GUIDANCE FOR VASCULAR ACCESS  07/20/2017   Procedure: Ultrasound Guidance For Vascular Access;  Surgeon: Belva Crome, MD;  Location: [place] CV LAB;  Service: Cardiovascular;;  . US ECHOCARDIOGRAPHY  06/20/2009   EF 55-60%     Home Medications:  Prior to Admission medications   Medication Sig Start Date End Date Taking? Authorizing Provider  acetaminophen (TYLENOL) 500 MG tablet Take 1,000 mg by mouth every 6 (six) hours as needed for moderate pain or headache.   Yes [provider]  amiodarone (PACERONE) 200 MG tablet Take 1 tablet (200 mg total) by mouth daily. 09/16/17  Yes Dunn, Dayna N, PA-C  atorvastatin (LIPITOR) 20 MG tablet Take 1 tablet (20 mg total) by mouth daily. 03/17/17 03/12/18 Yes Weaver, Scott T, PA-C  cholecalciferol (VITAMIN D) 1000 units tablet Take 1,000 Units by mouth daily.   Yes [provider]  ELIQUIS 5 MG TABS tablet TAKE  1 TABLET BY MOUTH TWICE DAILY Patient taking differently: Take 5 mg by mouth 2 (two) times daily.  07/23/17  Yes Nahser, Wonda Cheng, MD  famotidine (PEPCID) 40 MG tablet Take 40 mg by mouth 2 (two) times daily.   Yes [provider]  furosemide (LASIX) 40 MG tablet Take 1 tablet (40 mg total) by mouth 2 (two) times daily. 09/08/17 12/07/17 Yes Nahser, Wonda Cheng, MD  levothyroxine (SYNTHROID, LEVOTHROID) 75 MCG tablet Take 1 tablet (75 mcg total) by mouth daily before breakfast. 08/21/17  Yes Isabelle Course, MD  potassium chloride (K-DUR,KLOR-CON) 10 MEQ tablet Take 1 tablet (10 mEq total) by mouth 2 (two) times daily. 09/08/17  Yes Nahser, Wonda Cheng, MD  spironolactone (ALDACTONE) 25 MG tablet Take one half tablet (12.5 mg) daily Patient taking differently: Take 12.5 mg by mouth daily.  08/31/17  Yes Nahser, Wonda Cheng, MD  traMADol (ULTRAM) 50 MG tablet Take 50 mg by  mouth every 6 (six) hours as needed for moderate pain.    Yes [provider]    Inpatient Medications: Scheduled Meds: . atorvastatin  20 mg Oral Daily  . chlorhexidine gluconate (MEDLINE KIT)  15 mL Mouth Rinse BID  . insulin aspart  0-9 Units Subcutaneous Q4H  . levothyroxine  37.5 mcg Intravenous Daily  . mouth rinse  15 mL Mouth Rinse 10 times per day   Continuous Infusions: . sodium chloride 50 mL/hr at 09/28/17 1500  . sodium chloride    . amiodarone 30 mg/hr (09/28/17 1500)  . famotidine (PEPCID) IV Stopped (09/28/17 0027)  . heparin 10,000 units/ 20 mL infusion syringe 400 Units/hr (09/28/17 1506)  . heparin Stopped (09/28/17 1306)  . levofloxacin (LEVAQUIN) IV 250 mg (09/28/17 1309)  . norepinephrine (LEVOPHED) Adult infusion 40 mcg/min (09/28/17 1500)  . dialysis replacement fluid (prismasate) 700 mL/hr at 09/28/17 1456  . dialysis replacement fluid (prismasate) 800 mL/hr at 09/28/17 1353  . dialysate (PRISMASATE) 2,000 mL/hr at 09/28/17 1514  . sodium chloride    . vasopressin (PITRESSIN) infusion - *FOR SHOCK* 0.03 Units/min (09/28/17 1500)   PRN Meds: Place/Maintain arterial line **AND** sodium chloride, acetaminophen **OR** acetaminophen, dextrose, fentaNYL (SUBLIMAZE) injection, heparin, heparin, midazolam, ondansetron (ZOFRAN) IV, sodium chloride  Allergies:    Allergies  Allergen Reactions  . Metoprolol     In 09/2017, developed severe bradycardia post cardioversion and required cessation of beta blocker. She is not allergic, but should be noted that she has h/o significant bradycardia.  . Cephalexin Rash and Other (See Comments)    Prefers not to have ? Psoriasis skin condition  . Tape Rash    At times develops light rash with some itching to adhesive areas of band-aids    Social History:   Social History   Socioeconomic History  . Marital status: Single    Spouse name: Not on file  . Number of children: Not on file  . Years of education: Not  on file  . Highest education level: Not on file  Occupational History  . Occupation: Disabled    Fish farm manager: NOT EMPLOYED  Social Needs  . Financial resource strain: Not on file  . Food insecurity:    Worry: Not on file    Inability: Not on file  . Transportation needs:    Medical: Not on file    Non-medical: Not on file  Tobacco Use  . Smoking status: Never Smoker  . Smokeless tobacco: Never Used  Substance and Sexual Activity  . Alcohol use: No  .  Drug use: No  . Sexual activity: Never    Birth control/protection: Post-menopausal  Lifestyle  . Physical activity:    Days per week: Not on file    Minutes per session: Not on file  . Stress: Not on file  Relationships  . Social connections:    Talks on phone: Not on file    Gets together: Not on file    Attends religious service: Not on file    Active member of club or organization: Not on file    Attends meetings of clubs or organizations: Not on file    Relationship status: Not on file  . Intimate partner violence:    Fear of current or ex partner: Not on file    Emotionally abused: Not on file    Physically abused: Not on file    Forced sexual activity: Not on file  Other Topics Concern  . Not on file  Social History Narrative   This with her mother, who cares for her.    Family History:   Family History  Problem Relation Age of Onset  . Hypertension Mother   . Cancer Mother        SKIN CANCER  . Atrial fibrillation Mother   . Breast cancer Cousin        MATERNAL COUSIN  . Diabetes Maternal Uncle      ROS:  Please see the history of present illness.  All other ROS reviewed and negative.     Physical Exam/Data:   Vitals:   09/28/17 1136 09/28/17 1200 09/28/17 1300 09/28/17 1400  BP:   (!) 86/75 (!) 89/31  Pulse:   (!) 116 (!) 50  Resp:  19 (!) 23 (!) 22  Temp:  (!) 95.7 F (35.4 C) (!) 95.9 F (35.5 C) (!) 96.3 F (35.7 C)  TempSrc:   Bladder   SpO2: 100%  93% 98%  Weight:        Intake/Output  Summary (Last 24 hours) at 09/28/2017 1534 Last data filed at 09/28/2017 1500 Gross per 24 hour  Intake 2674.26 ml  Output 1336 ml  Net 1338.26 ml   Filed Weights   09/14/2017 1400 09/28/17 0500  Weight: 59.9 kg 62.2 kg   Body mass index is 30.74 kg/m.  General:  Acutely ill, intubated, sedated HEENT: broad neck Lymph: not assessed Neck: no JVD Endocrine:  Not assessed Vascular: No carotid bruits Cardiac:  RRR; + SM/DM, no gallops or rubs appreciated Lungs:  CTA b/l (anterior auscultations only), no wheezing, rhonchi or rales  Abd: soft, obese  Ext: no edema Musculoskeletal:  No deformities Skin: warm and dry (w/warming blanket) Neuro:  Unable to assess Psych: unable to assess  EKG:  The EKG was personally reviewed and demonstrates:    EMS EKG WCT 171bpm #1 here is SR, 91bbpm, IVCD, Q V1 #2 SR 89bpm, QS V1, poor R progression #3 today is SR, PACs, 107bpm, QS V1, poor/no R progression Telemetry:  Telemetry was personally reviewed and demonstrates:   SR, PACs, occ PVC, rare couplet, 3 beat NSVT  Relevant CV Studies:  09/28/17: TTE Study Conclusions - Left ventricle: The cavity size was moderately dilated. There was   moderate focal basal hypertrophy. Systolic function was   moderately to severely reduced. The estimated ejection fraction   was in the range of 30% to 35%. Severe diffuse hypokinesis with   distinct regional wall motion abnormalities. There is akinesis of   the apical myocardium. The study was not technically sufficient  to allow evaluation of LV diastolic dysfunction due to atrial   fibrillation. - Aortic valve: There was severe regurgitation. Valve area (VTI):   1.55 cm^2. Valve area (Vmax): 1.44 cm^2. Valve area (Vmean): 1.33   cm^2. Regurgitation pressure half-time: 249 ms. - Mitral valve: Calcified annulus. There was mild regurgitation.   Valve area by continuity equation (using LVOT flow): 1.15 cm^2. - Left atrium: The atrium was moderately  dilated. - Tricuspid valve: There was trivial regurgitation. - Pulmonic valve: There was trivial regurgitation. - Pulmonary arteries: Systolic pressure could not be accurately   estimated. Impressions: - Compared to prior echo, LVF has declined with apical ballooning   and akinetic apex. 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  ECHO: 05/05/2017 - Left ventricle: The cavity size was normal. Wall thickness was normal. Systolic function was normal. The estimated ejection fraction was in the range of 55% to 60%. Doppler parameters are consistent with abnormal left ventricular relaxation (grade 1 diastolic dysfunction). - Aortic valve: Trileaflet; mildly calcified leaflets. Mildly elevated mean gradient across aortic valve but the valve appears to open well. Suspect this is due to high flow with severe aortic insufficiency. There was severe regurgitation. Mean gradient (S): 15 mm Hg. - Mitral valve: There was no significant regurgitation. - Left atrium: The atrium was moderately dilated. - Right ventricle: The cavity size was normal. Systolic function was normal. - Pulmonary arteries: No complete TR doppler jet so unable to estimate PA systolic pressure. - Inferior vena cava: The vessel was normal in size. The respirophasic diameter changes were in the normal range (= 50%), consistent with normal central venous pressure. Impressions: - Normal LV size and systolic function, EF 49-70%. Normal RV size and systolic function. Moderately dilated LA. The aortic valve was trileaflet with severe aortic insufficiency. Mean gradient across the valve was mildly elevated, but suspect this was due to high flow in the setting of aortic insufficiency rather than valvular stenosis.   TEE: 06/14/2017 - Left ventricle: The estimated ejection fraction was in the range of 50% to 55%. No evidence of thrombus. - Aortic valve: There was severe regurgitation. - Mitral valve:  There was mild regurgitation. - Left atrium: No evidence of thrombus in the atrial cavity or appendage. - Right atrium: No evidence of thrombus in the atrial cavity or appendage. Impressions: - Low normal LV function Severe AI . The procedure was aborted early due to airway concerns and SVT which required cardioversion Small - moderate sized perimembranous VSD . Successful cardioversion. No cardiac source of emboli was indentified.   CATH: 07/20/2017  Severe aortic regurgitation  Low normal LV systolic function. LV enlargement, mild, with globular appearing LV cavity. EF 50%. LVEDP 18 mmHg.  Nonobstructive coronary disease with 50 to 60% proximal to mid circumflex. Segmental 40% proximal and mid LAD disease. 30% proximal RCA. Left main is normal.  Normal pulmonary artery pressures. No left to right shunt identified by oximetry run.   Laboratory Data:  Chemistry Recent Labs  Lab 09/08/2017 0810 09/18/2017 0819 09/08/2017 1544 09/28/17 0315  NA 132* 130* 136 135  K 5.9* 5.6* 6.8* 3.7  CL 93* 94* 95* 100  CO2 17*  --  19* 21*  GLUCOSE 194* 193* 191* 183*  BUN 27* 34* 28* 21  CREATININE 1.98* 1.80* 2.20* 1.57*  CALCIUM 9.3  --  8.9 7.3*  GFRNONAA 26*  --  23* 34*  GFRAA 30*  --  26* 40*  ANIONGAP 22*  --  22* 14    Recent Labs  Lab 10/06/2017 0810 09/28/17 0315  PROT 5.8*  --   ALBUMIN 3.4* 2.8*  AST 216*  --   ALT 223*  --   ALKPHOS 105  --   BILITOT 3.5*  --    Hematology Recent Labs  Lab 2017-10-06 0810 Oct 06, 2017 0819 09/28/17 0315  WBC 19.0*  --  25.3*  RBC 4.87  --  4.42  HGB 15.9* 17.7* 14.5  HCT 52.0* 52.0* 44.8  MCV 106.8*  --  101.4*  MCH 32.6  --  32.8  MCHC 30.6  --  32.4  RDW 13.9  --  13.8  PLT 325  --  204   Cardiac EnzymesNo results for input(s): TROPONINI in the last 168 hours.  Recent Labs  Lab 10/06/2017 0818  TROPIPOC 2.16*    BNP Recent Labs  Lab October 06, 2017 0810  BNP >4,500.0*    DDimer No results for input(s):  DDIMER in the last 168 hours.  Radiology/Studies:   US Renal Result Date: 2017-10-06 CLINICAL DATA:  Acute renal failure EXAM: RENAL / URINARY TRACT ULTRASOUND COMPLETE COMPARISON:  None. FINDINGS: Right Kidney: Length: 10.7 cm. Echogenicity within normal limits. No mass or hydronephrosis visualized. Left Kidney: Length: 9.7 cm. Echogenicity within normal limits. No mass or hydronephrosis visualized. Bladder: Decompressed and poorly visualized. IMPRESSION: No sonographic findings to explain the patient's acute renal failure. Cortical-medullary distinction is maintained without evidence of medical renal disease. No obstructive uropathy is noted. Electronically Signed   By: Ashley Royalty M.D.   On: 06-Oct-2017 23:18   Dg Chest Port 1 View Result Date: 09/28/2017 CLINICAL DATA:  Intubation.  Hypoxia. EXAM: PORTABLE CHEST 1 VIEW COMPARISON:  10-06-17. FINDINGS: Endotracheal tube and left IJ line in stable position. Cardiomegaly with diffuse bilateral pulmonary infiltrates and right pleural effusion. Findings consistent with CHF. Findings have worsened from prior exam. Bilateral pneumonia cannot be excluded. Low lung volumes. No pneumothorax. IMPRESSION: 1.  Lines and tubes stable position. 2. Cardiomegaly with diffuse bilateral pulmonary infiltrates and prominent right pleural effusion. Findings consistent with CHF. Findings have worsened from prior exam. Bilateral pneumonia cannot be excluded. Low lung volumes. Electronically Signed   By: Marcello Moores  Register   On: 09/28/2017 10:24    Assessment and Plan:   1. MSOF     2. Respiratory failure, intubated/sedated          3. CHF, acute on chronic     3. Hypotension on pressors     4. ARF on CRRT         Nephrology following  5. Turner Syndrome      6. Known severe AI (eval with CTS, not yet needed for AVR), small to moderate perimembranous VSD (this was not appreciated by the CT), CT showed Ascending Aortic root 3.5 cm with abnormal appearing arch which is  elongated and horizontal with 'pseudo coarctation' tapering from 19 mm to 15 mm in it's distal portion before descending.   7. Lactic acidosis     Down to 3.0 8. Leukocytosis     Hypothermic (not cooled)     On Abx, ?UTI  9.  Wide complex tachycardia upon EMS arrival     Treated with amio by EMS, arrived in SR     Unclear if this was 2/2 decompensating CHF? Hyperkalemia?     New abnormal LVEF this admission     Remains on amiodarone gtt  10: persistent AFib       CHA2DS2Vasc isnow 2, with CM, on Eliquis out patient, hep gtt here  SR on tele  Dr. Rayann Heman to see later today No new recommendations at this juncture from EP standpoint.  We will follow from afar as the patient's clinical course progresses.   For questions or updates, please contact [place] Please consult [email or web address] for contact info under     Signed, Baldwin Jamaica, PA-C  09/28/2017 3:34 PM   I have seen, examined the patient, and reviewed the above assessment and plan.  Changes to above are made where necessary.  On exam, ill, on vent.  Family at bedside.  I have reviewed EMS tracings of wide complex tachycardia. Interestingly, qrs is very similar to ekg yesterday and today (esp 09/08/2017 at 8:02 am).  She now has a new IVCD.  In this setting, I cannot exclude SVT (atrial flutter or other) as the primary rhythm.  She is currently quite ill.  The etiology for her multiorgan failure is not clear.  Her prognosis is guarded at best.  General cardiology to follow. EP to reassess once clinically improved. Continue IV amiodarone for now,   Keep K > 3.9, Mg > 1.9  Co Sign: Thompson Grayer, MD 09/28/2017 5:45 PM

## 2017-09-28 NOTE — Progress Notes (Signed)
NAME:  [name], MRN:  [id number], DOB:  21-Aug-1955, LOS: 1 ADMISSION DATE:  09/30/2017, CONSULTATION DATE:  09/05/2017 REFERRING MD:  Dr. Rodena Medin, ER, CHIEF COMPLAINT:  Short of breath   Brief History   62 yo female with A fib and RVR, hypotension, acute renal failure, hypoxia, and lactic acidosis.  She was in hospital in September 2019 with A fib with RVR and severe aortic insufficiency.  She had cardioversion on 09/15/17, and then had bradycardia.  Her amiodarone dose was reduced.  She is followed by Dr. Vassie Loll for OSA on Bipap. Admitted with AMs, hypotension & AKI, presumed sepsis with lactic acidosis  Past Medical History Turner's syndrome, VSD, SVT, Aortic insufficiency, A fib, OSA, OA, Hypothyroidism, Hypertension, Difficult intubation, Chronic diastolic CHF, CAD  Significant Hospital Events   9/23 Admit  Consults: date of consult/date signed off & final recs:  Cardiology 9/23 a fib with RVR >>  Procedures (surgical and bedside):  [facility] HD cath 9/23 >> ETT 9/23>> Lt radial a line 9/23 >>  Significant Diagnostic Tests:  RHC/LHC 07/20/17 >> EF 50%, severe aortic regurgitation, non obstructive CAD, normal PA pressures, no shunt  Micro Data: Blood 9/23 >> Urine 9/23 >>  Antimicrobials:  Levaquin 9/23 >>    Subjective:  Developed florid shock requiring pressors. Required intubation. Persistent acidosis and hyperkalemia requiring CRRT overnight. Remains critically ill  Objective   Blood pressure (!) 81/64, pulse (!) 52, temperature (!) 95 F (35 C), resp. rate (!) 22, weight 62.2 kg, SpO2 100 %.    Vent Mode: PRVC FiO2 (%):  [40 %-100 %] 40 % Set Rate:  [22 bmp] 22 bmp Vt Set:  [350 mL] 350 mL PEEP:  [5 cmH20] 5 cmH20 Plateau Pressure:  [12 cmH20-15 cmH20] 14 cmH20   Intake/Output Summary (Last 24 hours) at 09/28/2017 0955 Last data filed at 09/28/2017 0900 Gross per 24 hour  Intake 2032.65 ml  Output 666 ml  Net 1366.65 ml   Filed Weights   09/07/2017 1400  09/28/17 0500  Weight: 59.9 kg 62.2 kg    Examination:  Gen. obese, in no distress, oral ETT ENT - thick neck 'webbed', pupils 3mm BERTL Neck: No JVD, no thyromegaly, no carotid bruits Lungs: no use of accessory muscles, no dullness to percussion, decreased without rales or rhonchi  Cardiovascular: Rhythm irregular, heart sounds  normal, no murmurs or gallops, no peripheral edema , on amio gtt Abdomen: soft and non-tender, no hepatosplenomegaly, BS normal. Musculoskeletal: No deformities, no cyanosis or clubbing Neuro:  sedate, non focal   Assessment & Plan:   Shock - septic vs cardiogenic Severe Aortic insufficiency - seen by [facility] 08/2017, felt not severe enough for AVR Acute on chronic diastolic CHF. - Levo gtt, add vaso  - hold outpt lasix, aldactone, eliquis -Await cards input with rpt echo  A fib with RVR. - continue amiodarone gtt - heparin gtt   Acute hypoxic respiratory failure. Obstructive sleep apnea on home bipap - Vent settings reviewed & adjsted -CXR personally reviewed shows ET tube in position with bilateral effusions and cardiomegaly which is chronic  AKI >> baseline creatinine 0.93 from 09/13/17. Hyperkalemia -resolved with CRRT - hold lasix, aldactone Hx of hypothyroidism. - continue synthroid  Lactic acidosis. - most likely from hypoxia/hypotension  - f/u lactic acid  ?UTI  - no clear source of infection, but could have UTI Empiric meropenem/ levaquin/ vanc Await cx data  Protein calorie malnutrition - start trickle TFs   Disposition / Summary of  Today's Plan 09/28/17   Added vasopressin, and adjusted event, reviewed electrolytes on CRRT, guarded prognosis given to mother, will involve palliative care. Question here is whether this is related to septic shock or due to worsening AI causing cardiogenic shock and AKI      Code Status: Full code Family Communication: Update pt's mother at bedside  Labs   CBC: Recent Labs  Lab  03-05-17 0810 03-05-17 0819 09/28/17 0315  WBC 19.0*  --  25.3*  NEUTROABS 14.8*  --   --   HGB 15.9* 17.7* 14.5  HCT 52.0* 52.0* 44.8  MCV 106.8*  --  101.4*  PLT 325  --  204   Basic Metabolic Panel: Recent Labs  Lab 03-05-17 0810 03-05-17 0819 03-05-17 1544 09/28/17 0315  NA 132* 130* 136 135  K 5.9* 5.6* 6.8* 3.7  CL 93* 94* 95* 100  CO2 17*  --  19* 21*  GLUCOSE 194* 193* 191* 183*  BUN 27* 34* 28* 21  CREATININE 1.98* 1.80* 2.20* 1.57*  CALCIUM 9.3  --  8.9 7.3*  MG 2.6*  --   --  2.5*  PHOS 7.5*  --   --  4.9*   GFR: Estimated Creatinine Clearance: 27.4 mL/min (A) (by C-G formula based on SCr of 1.57 mg/dL (H)). Recent Labs  Lab 03-05-17 0810 03-05-17 0819 03-05-17 0950 03-05-17 1240 03-05-17 1544 09/28/17 0315  PROCALCITON  --   --   --   --  1.04  --   WBC 19.0*  --   --   --   --  25.3*  LATICACIDVEN  --  10.38* 10.61* 10.5*  --   --    Liver Function Tests: Recent Labs  Lab 03-05-17 0810 09/28/17 0315  AST 216*  --   ALT 223*  --   ALKPHOS 105  --   BILITOT 3.5*  --   PROT 5.8*  --   ALBUMIN 3.4* 2.8*   No results for input(s): LIPASE, AMYLASE in the last 168 hours. No results for input(s): AMMONIA in the last 168 hours. ABG    Component Value Date/Time   PHART 7.374 06-27-2017 2010   PCO2ART 50.6 (H) 06-27-2017 2010   PO2ART 282.0 (H) 06-27-2017 2010   HCO3 29.5 (H) 06-27-2017 2010   TCO2 31 06-27-2017 2010   ACIDBASEDEF 3.0 (H) 06-27-2017 1845   O2SAT 100.0 06-27-2017 2010    Coagulation Profile: No results for input(s): INR, PROTIME in the last 168 hours. Cardiac Enzymes: Recent Labs  Lab 03-05-17 1821  CKTOTAL 257*   HbA1C: Hgb A1c MFr Bld  Date/Time Value Ref Range Status  08/28/2014 03:05 PM 5.7 (H) 4.8 - 5.6 % Final    Comment:    (NOTE)         Pre-diabetes: 5.7 - 6.4         Diabetes: >6.4         Glycemic control for adults with diabetes: <7.0    CBG: Recent Labs  Lab 03-05-17 2022 03-05-17 2106  03-05-17 2358 09/28/17 0032 09/28/17 0332  GLUCAP 276* 314* 240* 210* 190*     My independent critical care time x 6832m  Cyril Mourningakesh Alva MD. FCCP. [facility] Pager [phone] If no response call [phone]   09/28/2017

## 2017-09-28 NOTE — Progress Notes (Signed)
ANTICOAGULATION CONSULT NOTE - Follow Up Consult  Pharmacy Consult for heparin Indication: atrial fibrillation  Labs: Recent Labs    26-Aug-2017 0810 26-Aug-2017 0819 26-Aug-2017 1544 26-Aug-2017 1821 09/28/17 0315 09/28/17 1118  HGB 15.9* 17.7*  --   --  14.5  --   HCT 52.0* 52.0*  --   --  44.8  --   PLT 325  --   --   --  204  --   APTT  --   --   --   --  >200* 71*  HEPARINUNFRC  --   --   --   --  >2.20* >2.20*  CREATININE 1.98* 1.80* 2.20*  --  1.57*  --   CKTOTAL  --   --   --  257*  --   --     Assessment: 61yo female on apixaban prior to admission for afib; last dose 9/22 PM. HL above goal, apixaban likely contributing to elevated HL. APTT therapeutic at 71 on heparin 500 units/hr. Heparin had to be held for levofloxacin infusion due to limited line access. Of note, lab is drawn distal to where heparin is infusing. H/H relatively stable. No signs and symptoms of bleeding noted or issues with infusion noted.   Goal of Therapy:  aPTT 66-102 seconds   Plan:  Restart heparin at 500 units/hr one levofloxacin infusion has finished Check aPTT/HL in 8 hours and daily while on heparin Continue to monitor H&H and platelets   Marcelino FreestoneEmily McElhaney, PharmD PGY2 Cardiology Pharmacy Resident Phone [phone] 09/28/2017 2:34 PM

## 2017-09-28 NOTE — Progress Notes (Signed)
ANTICOAGULATION CONSULT NOTE - Follow Up Consult  Pharmacy Consult for Heparin (Apixaban on hold) Indication: atrial fibrillation  Allergies  Allergen Reactions  . Metoprolol     In 09/2017, developed severe bradycardia post cardioversion and required cessation of beta blocker. She is not allergic, but should be noted that she has h/o significant bradycardia.  . Cephalexin Rash and Other (See Comments)    Prefers not to have ? Psoriasis skin condition  . Tape Rash    At times develops light rash with some itching to adhesive areas of band-aids   Patient Measurements: Weight: 137 lb 2 oz (62.2 kg)  Vital Signs: Temp: 100.4 F (38 C) (09/24 2300) Temp Source: Bladder (09/24 1800) BP: 93/80 (09/24 2321) Pulse Rate: 103 (09/24 2321)  Labs: Recent Labs    06/09/2017 0810 06/09/2017 0819 06/09/2017 1544 06/09/2017 1821 09/28/17 0315 09/28/17 1118 09/28/17 2027  HGB 15.9* 17.7*  --   --  14.5  --   --   HCT 52.0* 52.0*  --   --  44.8  --   --   PLT 325  --   --   --  204  --   --   APTT  --   --   --   --  >200* 71* 118*  HEPARINUNFRC  --   --   --   --  >2.20* >2.20* 2.16*  CREATININE 1.98* 1.80* 2.20*  --  1.57*  --  0.91  CKTOTAL  --   --   --  257*  --   --   --     Estimated Creatinine Clearance: 47.3 mL/min (by C-G formula based on SCr of 0.91 mg/dL).  Assessment: 61yo female on apixaban prior to admission for afib; last dose 9/22 PM. HL above goal, apixaban likely contributing to elevated HL. APTT therapeutic at 71 on heparin 500 units/hr. Heparin had to be held for levofloxacin infusion due to limited line access. Of note, lab is drawn distal to where heparin is infusing. H/H relatively stable. No signs and symptoms of bleeding noted or issues with infusion noted.   9/24 PM update: aPTT is elevated this PM, no issues per RN, heparin syringe through CRRT as well, heparin level elevated due to Apixaban use.   Goal of Therapy:  Heparin level 0.3-0.7 units/ml aPTT 66-102  seconds Monitor platelets by anticoagulation protocol: Yes   Plan:  Decrease heparin to 350 units/hr APTT/Heparin level with AM labs  Abran DukeLedford, Braxon Suder 09/28/2017,11:33 PM

## 2017-09-29 ENCOUNTER — Inpatient Hospital Stay (HOSPITAL_COMMUNITY): Payer: Medicaid Other

## 2017-09-29 ENCOUNTER — Other Ambulatory Visit: Payer: Self-pay

## 2017-09-29 DIAGNOSIS — Z515 Encounter for palliative care: Secondary | ICD-10-CM

## 2017-09-29 DIAGNOSIS — R570 Cardiogenic shock: Secondary | ICD-10-CM

## 2017-09-29 LAB — RENAL FUNCTION PANEL
Albumin: 2.6 g/dL — ABNORMAL LOW (ref 3.5–5.0)
Albumin: 2.7 g/dL — ABNORMAL LOW (ref 3.5–5.0)
Anion gap: 15 (ref 5–15)
Anion gap: 12 (ref 5–15)
BUN: <5 mg/dL — ABNORMAL LOW (ref 8–23)
BUN: <5 mg/dL — ABNORMAL LOW (ref 8–23)
CHLORIDE: 101 mmol/L (ref 98–111)
CHLORIDE: 103 mmol/L (ref 98–111)
CO2: 18 mmol/L — AB (ref 22–32)
CO2: 21 mmol/L — AB (ref 22–32)
CREATININE: 0.78 mg/dL (ref 0.44–1.00)
CREATININE: 0.56 mg/dL (ref 0.44–1.00)
Calcium: 6.8 mg/dL — ABNORMAL LOW (ref 8.9–10.3)
Calcium: 7 mg/dL — ABNORMAL LOW (ref 8.9–10.3)
GFR calc Af Amer: >60 mL/min (ref >60)
GFR calc non Af Amer: >60 mL/min (ref >60)
Glucose, Bld: 144 mg/dL — ABNORMAL HIGH (ref 70–99)
Glucose, Bld: 133 mg/dL — ABNORMAL HIGH (ref 70–99)
POTASSIUM: 5.2 mmol/L — AB (ref 3.5–5.1)
Phosphorus: 2.7 mg/dL (ref 2.5–4.6)
Phosphorus: 1.8 mg/dL — ABNORMAL LOW (ref 2.5–4.6)
Potassium: 4 mmol/L (ref 3.5–5.1)
Sodium: 134 mmol/L — ABNORMAL LOW (ref 135–145)
Sodium: 136 mmol/L (ref 135–145)

## 2017-09-29 LAB — LACTIC ACID, PLASMA: LACTIC ACID, VENOUS: 5.8 mmol/L — AB (ref 0.5–1.9)

## 2017-09-29 LAB — BLOOD GAS, ARTERIAL
Acid-base deficit: 5.6 mmol/L — ABNORMAL HIGH (ref 0.0–2.0)
BICARBONATE: 19.5 mmol/L — AB (ref 20.0–28.0)
FIO2: 40
O2 Saturation: 99.2 %
PCO2 ART: 39.8 mmHg (ref 32.0–48.0)
PEEP/CPAP: 5 cmH2O
Patient temperature: 98.6
RATE: 22 resp/min
VT: 350 mL
pH, Arterial: 7.311 — ABNORMAL LOW (ref 7.350–7.450)
pO2, Arterial: 192 mmHg — ABNORMAL HIGH (ref 83.0–108.0)

## 2017-09-29 LAB — HEPARIN LEVEL (UNFRACTIONATED)
HEPARIN UNFRACTIONATED: 1.5 [IU]/mL — AB (ref 0.30–0.70)
HEPARIN UNFRACTIONATED: 1.1 [IU]/mL — AB (ref 0.30–0.70)

## 2017-09-29 LAB — CBC
HEMATOCRIT: 45.4 % (ref 36.0–46.0)
HEMOGLOBIN: 14.1 g/dL (ref 12.0–15.0)
MCH: 32.6 pg (ref 26.0–34.0)
MCHC: 31.1 g/dL (ref 30.0–36.0)
MCV: 105.1 fL — ABNORMAL HIGH (ref 78.0–100.0)
PLATELETS: 123 10*3/uL — AB (ref 150–400)
RBC: 4.32 MIL/uL (ref 3.87–5.11)
RDW: 14.2 % (ref 11.5–15.5)
WBC: 24.1 10*3/uL — ABNORMAL HIGH (ref 4.0–10.5)

## 2017-09-29 LAB — GLUCOSE, CAPILLARY
GLUCOSE-CAPILLARY: 100 mg/dL — AB (ref 70–99)
GLUCOSE-CAPILLARY: 162 mg/dL — AB (ref 70–99)
GLUCOSE-CAPILLARY: 141 mg/dL — AB (ref 70–99)
Glucose-Capillary: 153 mg/dL — ABNORMAL HIGH (ref 70–99)
Glucose-Capillary: 157 mg/dL — ABNORMAL HIGH (ref 70–99)
Glucose-Capillary: 123 mg/dL — ABNORMAL HIGH (ref 70–99)

## 2017-09-29 LAB — POCT ACTIVATED CLOTTING TIME
ACTIVATED CLOTTING TIME: 180 s
ACTIVATED CLOTTING TIME: 180 s
ACTIVATED CLOTTING TIME: 197 s
Activated Clotting Time: 175 seconds
Activated Clotting Time: 180 seconds
Activated Clotting Time: 180 seconds
Activated Clotting Time: 180 seconds
Activated Clotting Time: 180 seconds
Activated Clotting Time: 186 seconds
Activated Clotting Time: 186 seconds
Activated Clotting Time: 186 seconds
Activated Clotting Time: 191 seconds
Activated Clotting Time: 197 seconds
Activated Clotting Time: 202 seconds
Activated Clotting Time: 202 seconds
Activated Clotting Time: 213 seconds

## 2017-09-29 LAB — POCT I-STAT, CHEM 8
Calcium, Ion: 0.92 mmol/L — ABNORMAL LOW (ref 1.15–1.40)
Chloride: 100 mmol/L (ref 98–111)
Creatinine, Ser: 0.5 mg/dL (ref 0.44–1.00)
Glucose, Bld: 145 mg/dL — ABNORMAL HIGH (ref 70–99)
HEMATOCRIT: 45 % (ref 36.0–46.0)
Hemoglobin: 15.3 g/dL — ABNORMAL HIGH (ref 12.0–15.0)
POTASSIUM: 4.1 mmol/L (ref 3.5–5.1)
SODIUM: 135 mmol/L (ref 135–145)
TCO2: 25 mmol/L (ref 22–32)

## 2017-09-29 LAB — HEPATITIS B SURFACE ANTIGEN: Hepatitis B Surface Ag: NEGATIVE

## 2017-09-29 LAB — HEPATITIS B SURFACE ANTIBODY,QUALITATIVE: Hep B S Ab: REACTIVE

## 2017-09-29 LAB — APTT
APTT: 168 s — AB (ref 24–36)
aPTT: 151 seconds — ABNORMAL HIGH (ref 24–36)

## 2017-09-29 LAB — HCV COMMENT:

## 2017-09-29 LAB — HEPATITIS C ANTIBODY (REFLEX)

## 2017-09-29 LAB — MAGNESIUM: Magnesium: 2.2 mg/dL (ref 1.7–2.4)

## 2017-09-29 MED ORDER — PERFLUTREN LIPID MICROSPHERE
4.0000 mL | Freq: Once | INTRAVENOUS | Status: AC
  Administered 2017-09-29: 4 mL via INTRAVENOUS

## 2017-09-29 MED ORDER — HEPARIN (PORCINE) IN NACL 100-0.45 UNIT/ML-% IJ SOLN: 250.0000 [IU]/h | INTRAMUSCULAR | Status: DC

## 2017-09-29 MED ORDER — ALBUMIN HUMAN 25 % IV SOLN
25.0000 g | Freq: Once | INTRAVENOUS | Status: AC
  Administered 2017-09-29: 25 g via INTRAVENOUS
  Filled 2017-09-29: qty 50

## 2017-09-29 NOTE — Progress Notes (Signed)
ANTICOAGULATION CONSULT NOTE - Follow Up Consult  Pharmacy Consult for Heparin (Apixaban on hold) Indication: atrial fibrillation  Allergies  Allergen Reactions  . Metoprolol     In 09/2017, developed severe bradycardia post cardioversion and required cessation of beta blocker. She is not allergic, but should be noted that she has h/o significant bradycardia.  . Cephalexin Rash and Other (See Comments)    Prefers not to have ? Psoriasis skin condition  . Tape Rash    At times develops light rash with some itching to adhesive areas of band-aids   Patient Measurements: Weight: 137 lb 2 oz (62.2 kg)  Vital Signs: Temp: 95.7 F (35.4 C) (09/25 0600) Temp Source: Bladder (09/25 0000) BP: 99/84 (09/25 0600) Pulse Rate: 99 (09/25 0500)  Labs: Recent Labs    09/17/2017 0810 09/23/2017 0819  09/15/2017 1821  09/28/17 0315 09/28/17 1118 09/28/17 2027 09/29/17 0400 09/29/17 0420  HGB 15.9* 17.7*  --   --   --  14.5  --   --   --   --   HCT 52.0* 52.0*  --   --   --  44.8  --   --   --   --   PLT 325  --   --   --   --  204  --   --   --   --   APTT  --   --   --   --    < > >200* 71* 118* >200*  --   HEPARINUNFRC  --   --   --   --    < > >2.20* >2.20* 2.16*  --  1.50*  CREATININE 1.98* 1.80*   < >  --   --  1.57*  --  0.91  --  0.78  CKTOTAL  --   --   --  257*  --   --   --   --   --   --    < > = values in this interval not displayed.    Estimated Creatinine Clearance: 53.8 mL/min (by C-G formula based on SCr of 0.78 mg/dL).  Assessment: 61yo female on apixaban prior to admission for afib; last dose 9/22 PM. HL above goal, apixaban likely contributing to elevated HL. APTT therapeutic at 71 on heparin 500 units/hr. Heparin had to be held for levofloxacin infusion due to limited line access. Of note, lab is drawn distal to where heparin is infusing. H/H relatively stable. No signs and symptoms of bleeding noted or issues with infusion noted.   9/25 AM update: aPTT is elevated this  AM, drawn from A-line with heparin running through peripheral line, no issues per RN, heparin syringe through CRRT as well, heparin level elevated due to Apixaban use, pt critically ill overnight requiring max supports from multiple pressors   Goal of Therapy:  Heparin level 0.3-0.7 units/ml aPTT 66-102 seconds Monitor platelets by anticoagulation protocol: Yes   Plan:  Decrease heparin to 250 units/hr after holding heparin x 1 hr APTT/Heparin level at 1530  Abran Duke 09/29/2017,6:17 AM

## 2017-09-29 NOTE — Plan of Care (Signed)
Dr. Carlota Raspberry (CCM) came to bedside around 20:00 9/24 at RN request d/t worsening clinical status of patient - foremost, an increasing need for multiple pressors, bicarb. MD reviewed chart, assessed patient, and spoke at length with family regarding pt's condition and plan/goals of care. Pt's mother (NoK) verbalized desire for pt to stay a Full Code despite poor prognosis.   Per MD, plan is to titrate current pressors as needed but not to add any more pressors when maxed out. Orders placed for bicarb gtt, stress dose steroids. MD told myself and pt's primary RN Morrie Sheldon that if CRRT filter clots off again to return the blood (if possible) and d/c CRRT. Said we could notify [facility] if she's needed at bedside again overnight.  Will continue to support pt's primary RN and monitor situation.  Peyton Bottoms, RN 09/28/2017 21:00

## 2017-09-29 NOTE — Procedures (Signed)
Central Venous Catheter Insertion Procedure Note [name] [id number] [birth year]  Procedure: Insertion of Central Venous Catheter Indications: Drug and/or fluid administration  Procedure Details Consent: Risks of procedure as well as the alternatives and risks of each were explained to the (patient/caregiver).  Consent for procedure obtained. Time Out: Verified patient identification, verified procedure, site/side was marked, verified correct patient position, special equipment/implants available, medications/allergies/relevent history reviewed, required imaging and test results available.  Performed  Maximum sterile technique was used including antiseptics, cap, gloves, gown, hand hygiene, mask and sheet. Skin prep: Chlorhexidine; local anesthetic administered A antimicrobial bonded/coated triple lumen catheter was placed in the right femoral vein due to patient being a dialysis patient using the Seldinger technique.  Evaluation Blood flow good Complications: No apparent complications Patient did tolerate procedure well. Chest X-ray ordered to verify placement.  CXR: not applicable.  [name] 09/29/2017, 6:51 PM

## 2017-09-29 NOTE — Progress Notes (Signed)
Cortrak Tube Team Note:  Consult received to place a Cortrak feeding tube.   A 10 F Cortrak tube was placed in the left nare and secured with a nasal bridle at 53 cm. Per the Cortrak monitor reading the tube tip is in the distal esophagus vs the proximal stomach. Pt is only 4'8" tall making it difficult to determine placement. Tube unable to be placed past 53 cm without coiling.Tube secured with nasal tape. Will obtain radiograph to determine placement. If tube has not entered into the stomach; recommend remove tube and IR placement of small bore feeding tube.   X-ray is required, abdominal x-ray has been ordered by the Cortrak team. Please confirm tube placement before using the Cortrak tube.   If the tube becomes dislodged please keep the tube and contact the Cortrak team at [email or web address] (password [email or web address]) for replacement.  If after hours and replacement cannot be delayed, place a NG tube and confirm placement with an abdominal x-ray.    Betsey Holiday MS, RD, LDN Pager #- [phone] Office#- [phone] After Hours Pager: [phone]

## 2017-09-29 NOTE — Progress Notes (Signed)
   09/29/2017  Echo attempted twice.  Definity images attempted, however the patient's line was occluded.  IV team arrived and attempted four times to give the patient another IV; however, they were unsuccessful.    Physician's are being consulted for advice on how to proceed.  Belva Chimes Echocardiography

## 2017-09-29 NOTE — Procedures (Signed)
Arterial Catheter Insertion Procedure Note [name] [id number] Nov 26, 1955  Procedure: Insertion of Arterial Catheter  Indications: Blood pressure monitoring and Frequent blood sampling  Procedure Details Consent: Risks of procedure as well as the alternatives and risks of each were explained to the (patient/caregiver).  Consent for procedure obtained. Time Out: Verified patient identification, verified procedure, site/side was marked, verified correct patient position, special equipment/implants available, medications/allergies/relevent history reviewed, required imaging and test results available.  Performed  Maximum sterile technique was used including antiseptics, cap, gloves, gown, hand hygiene, mask and sheet. Skin prep: Chlorhexidine; local anesthetic administered 20 gauge catheter was inserted into right femoral artery using the Seldinger technique. ULTRASOUND GUIDANCE USED: YES Evaluation Blood flow good; BP tracing good. Complications: No apparent complications.   [name] 09/29/2017

## 2017-09-29 NOTE — Plan of Care (Signed)
  Problem: Pain Managment: Goal: General experience of comfort will improve Outcome: Progressing   Problem: Clinical Measurements: Goal: Dialysis access will remain free of complications Outcome: Progressing   Problem: Clinical Measurements: Goal: Ability to maintain clinical measurements within normal limits will improve Outcome: Not Progressing Goal: Diagnostic test results will improve Outcome: Not Progressing   Problem: Nutrition: Goal: Adequate nutrition will be maintained Outcome: Not Progressing   Problem: Respiratory: Goal: Ability to maintain a clear airway and adequate ventilation will improve Outcome: Not Progressing

## 2017-09-29 NOTE — Progress Notes (Signed)
Patients mother, Alona Bene, used the bathroom in patients room and subsequently the room began to smell of cigarette smoke. This RN went into bathroom and was overwhelmed with the smell of cigarette smoke in the bathroom. Mother admits to smoking in the room but states that she was unaware she was not allowed to smoke in the hospital. Charge RN, department directors and security notified of incident. Addaline Peplinski was escorted out of room into waiting area, agrees to not smoke in building again.

## 2017-09-29 NOTE — Progress Notes (Signed)
Progress Note  Patient Name: [name] Date of Encounter: 09/29/2017  Primary Cardiologist:   Mertie Moores, MD   Subjective   Intubated and sedated.  Opens eyes  Inpatient Medications    Scheduled Meds: . atorvastatin  20 mg Oral Daily  . B-complex with vitamin C  1 tablet Per Tube Daily  . chlorhexidine gluconate (MEDLINE KIT)  15 mL Mouth Rinse BID  . feeding supplement (VITAL HIGH PROTEIN)  1,000 mL Per Tube Q24H  . hydrocortisone sod succinate (SOLU-CORTEF) inj  50 mg Intravenous Q6H  . insulin aspart  0-9 Units Subcutaneous Q4H  . levothyroxine  37.5 mcg Intravenous Daily  . mouth rinse  15 mL Mouth Rinse 10 times per day   Continuous Infusions: . sodium chloride 50 mL/hr at 09/29/17 0800  . sodium chloride    . amiodarone 30 mg/hr (09/29/17 0800)  . famotidine (PEPCID) IV Stopped (09/28/17 2245)  . heparin 10,000 units/ 20 mL infusion syringe 650 Units/hr (09/29/17 0131)  . heparin 250 Units/hr (09/29/17 0800)  . levofloxacin (LEVAQUIN) IV 250 mg (09/28/17 1309)  . norepinephrine (LEVOPHED) Adult infusion 135 mcg/min (09/29/17 0800)  . phenylephrine (NEO-SYNEPHRINE) Adult infusion 400 mcg/min (09/29/17 0702)  . dialysis replacement fluid (prismasate) 700 mL/hr at 09/28/17 2149  . dialysis replacement fluid (prismasate) 800 mL/hr at 09/29/17 0454  . dialysate (PRISMASATE) 2,000 mL/hr at 09/29/17 0627  .  sodium bicarbonate (isotonic) infusion in sterile water 100 mL/hr at 09/29/17 0800  . sodium chloride    . vasopressin (PITRESSIN) infusion - *FOR SHOCK* 0.03 Units/min (09/29/17 0800)   PRN Meds: Place/Maintain arterial line **AND** sodium chloride, acetaminophen **OR** acetaminophen, dextrose, fentaNYL (SUBLIMAZE) injection, heparin, heparin, midazolam, ondansetron (ZOFRAN) IV, sodium chloride   Vital Signs    Vitals:   09/29/17 0400 09/29/17 0500 09/29/17 0600 09/29/17 0813  BP: (!) 80/59 (!) 83/56 99/84 (!) 76/40  Pulse:  99  (!) 101  Resp: 17 (!)  24 (!) 22 (!) 23  Temp: (!) 95.5 F (35.3 C) (!) 95.5 F (35.3 C) (!) 95.7 F (35.4 C)   TempSrc:      SpO2:  92%  92%  Weight:  62.8 kg      Intake/Output Summary (Last 24 hours) at 09/29/2017 0838 Last data filed at 09/29/2017 0800 Gross per 24 hour  Intake 6850.4 ml  Output 5599 ml  Net 1251.4 ml   Filed Weights   09/14/2017 1400 09/28/17 0500 09/29/17 0500  Weight: 59.9 kg 62.2 kg 62.8 kg   Exam Lungs:  Decreased breath sounds COR:  Tachy  Telemetry    NSR, PACs:   Personally Reviewed  ECG    NA - Personally Reviewed   Labs    Chemistry Recent Labs  Lab 10/01/2017 0810  09/28/17 0315 09/28/17 2027 09/29/17 0420  NA 132*   < > 135 135 134*  K 5.9*   < > 3.7 5.2* 4.0  CL 93*   < > 100 103 101  CO2 17*   < > 21* 21* 18*  GLUCOSE 194*   < > 183* 77 144*  BUN 27*   < > 21 6* <5*  CREATININE 1.98*   < > 1.57* 0.91 0.78  CALCIUM 9.3   < > 7.3* 7.3* 6.8*  PROT 5.8*  --   --   --   --   ALBUMIN 3.4*  --  2.8* 2.7* 2.6*  AST 216*  --   --   --   --  ALT 223*  --   --   --   --   ALKPHOS 105  --   --   --   --   BILITOT 3.5*  --   --   --   --   GFRNONAA 26*   < > 34* >60 >60  GFRAA 30*   < > 40* >60 >60  ANIONGAP 22*   < > 14 11 15    < > = values in this interval not displayed.     Hematology Recent Labs  Lab 09/21/2017 0810 09/14/2017 0819 09/28/17 0315 09/29/17 0400  WBC 19.0*  --  25.3* 24.1*  RBC 4.87  --  4.42 4.32  HGB 15.9* 17.7* 14.5 14.1  HCT 52.0* 52.0* 44.8 45.4  MCV 106.8*  --  101.4* 105.1*  MCH 32.6  --  32.8 32.6  MCHC 30.6  --  32.4 31.1  RDW 13.9  --  13.8 14.2  PLT 325  --  204 123*    Cardiac EnzymesNo results for input(s): TROPONINI in the last 168 hours.  Recent Labs  Lab 09/26/2017 0818  TROPIPOC 2.16*     BNP Recent Labs  Lab 09/08/2017 0810  BNP >4,500.0*     DDimer No results for input(s): DDIMER in the last 168 hours.   Radiology    US Renal  Result Date: 09/30/2017 CLINICAL DATA:  Acute renal failure EXAM:  RENAL / URINARY TRACT ULTRASOUND COMPLETE COMPARISON:  None. FINDINGS: Right Kidney: Length: 10.7 cm. Echogenicity within normal limits. No mass or hydronephrosis visualized. Left Kidney: Length: 9.7 cm. Echogenicity within normal limits. No mass or hydronephrosis visualized. Bladder: Decompressed and poorly visualized. IMPRESSION: No sonographic findings to explain the patient's acute renal failure. Cortical-medullary distinction is maintained without evidence of medical renal disease. No obstructive uropathy is noted. Electronically Signed   By: Ashley Royalty M.D.   On: 09/25/2017 23:18   Dg Chest Port 1 View  Result Date: 09/28/2017 CLINICAL DATA:  Intubation.  Hypoxia. EXAM: PORTABLE CHEST 1 VIEW COMPARISON:  09/08/2017. FINDINGS: Endotracheal tube and left IJ line in stable position. Cardiomegaly with diffuse bilateral pulmonary infiltrates and right pleural effusion. Findings consistent with CHF. Findings have worsened from prior exam. Bilateral pneumonia cannot be excluded. Low lung volumes. No pneumothorax. IMPRESSION: 1.  Lines and tubes stable position. 2. Cardiomegaly with diffuse bilateral pulmonary infiltrates and prominent right pleural effusion. Findings consistent with CHF. Findings have worsened from prior exam. Bilateral pneumonia cannot be excluded. Low lung volumes. Electronically Signed   By: Marcello Moores  Register   On: 09/28/2017 10:24   Dg Chest Port 1 View  Result Date: 10/01/2017 CLINICAL DATA:  LEFT IJ central line placement. EXAM: PORTABLE CHEST 1 VIEW COMPARISON:  Chest x-ray from earlier same day. FINDINGS: LEFT IJ central line in place with tip at the level of the upper SVC. Endotracheal tube appears well positioned with tip just above the level of the carina. Pacer pad overlies the upper mediastinum. There is stable cardiomegaly with central pulmonary vascular congestion. Bibasilar opacities are stable, likely small pleural effusions and/or atelectasis. No pneumothorax seen  IMPRESSION: 1. LEFT IJ central line in place with tip positioned at the level of the upper SVC. No pneumothorax seen. 2. Endotracheal tube appears well positioned with tip just above the level of the carina. 3. Cardiomegaly with central pulmonary vascular congestion indicating mild CHF/volume overload. 4. Probable small bilateral pleural effusions and/or atelectasis. Electronically Signed   By: Franki Cabot M.D.   On:  09/14/2017 19:54   [place] Ekg Site Rite  Result Date: 09/19/2017 If Site Rite image not attached, placement could not be confirmed due to current cardiac rhythm.   Cardiac Studies   Echo  Study Conclusions  - Left ventricle: The cavity size was moderately dilated. There was   moderate focal basal hypertrophy. Systolic function was   moderately to severely reduced. The estimated ejection fraction   was in the range of 30% to 35%. Severe diffuse hypokinesis with   distinct regional wall motion abnormalities. There is akinesis of   the apical myocardium. The study was not technically sufficient   to allow evaluation of LV diastolic dysfunction due to atrial   fibrillation. - Aortic valve: There was severe regurgitation. Valve area (VTI):   1.55 cm^2. Valve area (Vmax): 1.44 cm^2. Valve area (Vmean): 1.33   cm^2. Regurgitation pressure half-time: 249 ms. - Mitral valve: Calcified annulus. There was mild regurgitation.   Valve area by continuity equation (using LVOT flow): 1.15 cm^2. - Left atrium: The atrium was moderately dilated. - Tricuspid valve: There was trivial regurgitation. - Pulmonic valve: There was trivial regurgitation. - Pulmonary arteries: Systolic pressure could not be accurately   estimated.  Impressions:  - Compared to prior echo, LVF has declined with apical ballooning   and akinetic apex. 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  Patient Profile     62 y.o. female with a hx of mod-severe AR/AI, D-CHF, OSA on BiPAP, Turner's syndrome, persistent A fib,   who is being seen for the evaluation of WCT at the request of Dr Halford Chessman.  Assessment & Plan    CARDIOGENIC AND POSSIBLY SEPTIC SHOCK: She is on maximal therapy and there is no indication for any mechanical support given her severe AI at baseline and other multiorgan system failure.  No further interventions are likely to affect the outcome.  Continue supportive care with comfort measures emphasized.  I spoke with her mom.    VT:  In the face of this acute illness.  See EP consult.  Continue amiodarone.    For questions or updates, please contact [place] Please consult [email or web address] for contact info under Cardiology/STEMI.   Signed, Minus Breeding, MD  09/29/2017, 8:38 AM

## 2017-09-29 NOTE — Progress Notes (Signed)
NAME:  [name], MRN:  [id number], DOB:  1955/01/27, LOS: 2 ADMISSION DATE:  09/26/2017, CONSULTATION DATE:  09/20/2017 REFERRING MD:  Dr. Rodena Medin, ER, CHIEF COMPLAINT:  Short of breath   Brief History   62 yo female with A fib and RVR, hypotension, acute renal failure, hypoxia, and lactic acidosis.  She was in hospital in September 2019 with A fib with RVR and severe aortic insufficiency.  She had cardioversion on 09/15/17, and then had bradycardia.  Her amiodarone dose was reduced.  She is followed by Dr. Vassie Loll for OSA on Bipap. Admitted with AMs, hypotension & AKI, presumed sepsis with lactic acidosis  Past Medical History Turner's syndrome, VSD, SVT, Aortic insufficiency, A fib, OSA, OA, Hypothyroidism, Hypertension, Difficult intubation, Chronic diastolic CHF, CAD  Significant Hospital Events   9/23 Admit 09/29/2017 change to limited CODE BLUE no CPR no shock  Consults: date of consult/date signed off & final recs:  Cardiology 9/23 a fib with RVR >> 09/28/2017 nephrology with CVVH instituted>>  Procedures (surgical and bedside):  [facility] HD cath 9/23 >> ETT 9/23>> Lt radial a line 9/23 >>  Significant Diagnostic Tests:  RHC/LHC 07/20/17 >> EF 50%, severe aortic regurgitation, non obstructive CAD, normal PA pressures, no shunt 09/29/1998 nineteen 2D echo with decreased EF and physiology consistent with Takotsubo cardiomyopathy Micro Data: Blood 9/23 >> Urine 9/23 >>  Antimicrobials:  Levaquin 9/23 >>    Subjective:  On max dose pressors Changed to a limited CODE BLUE no shock no CPR  Objective   Blood pressure (!) 76/40, pulse (!) 101, temperature (!) 95.7 F (35.4 C), resp. rate (!) 23, weight 62.8 kg, SpO2 92 %.    Vent Mode: PRVC FiO2 (%):  [40 %-100 %] 100 % Set Rate:  [22 bmp] 22 bmp Vt Set:  [350 mL] 350 mL PEEP:  [5 cmH20] 5 cmH20 Plateau Pressure:  [11 cmH20-16 cmH20] 11 cmH20   Intake/Output Summary (Last 24 hours) at 09/29/2017 0849 Last data filed at  09/29/2017 0800 Gross per 24 hour  Intake 6850.4 ml  Output 5599 ml  Net 1251.4 ml   Filed Weights   09/12/2017 1400 09/28/17 0500 09/29/17 0500  Weight: 59.9 kg 62.2 kg 62.8 kg    Examination:  General: Female currently intubated orogastric tube in place who is critically ill multiple vasopressors. HEENT: Tracheal tube and OG tube in place, widened neck noted Neuro: Opens eyes and tracks CV: Heart sounds are regular currently on neo-at 400 mics levo fed at her 34 mics with a heart rate of 90 and a systolic blood pressure of 83 PULM: Decreased breath sounds in the bases, mild bibasilar crackles [id number], non-tender, faint bowel sounds Extremities: warm/dry 1+ edema  Skin: no rashes or lesions    Assessment & Plan:   Shock - septic vs cardiogenic Severe Aortic insufficiency - seen by [facility] 08/2017, felt not severe enough for AVR Acute on chronic diastolic CHF. -Currently on Levophed at 145 mcg, Neo-Synephrine at 400 and blood pressure via left radial A-line of 83/47.  I suspect this may be an inaccurate reading with high-dose vasoconstrictors via a radial artery.  Her other laboratory numbers do not agree with this blood pressure.  Have asked RN to titrate drips down monitor blood pressure closely and hopefully the blood pressure readings will increase with the decrease in vasoconstrictors.  We may consider putting in a femoral arterial line to give [place] a more accurate blood pressure readings. -Cardiology is following she does have physiology  consistent with Takotsubo cardiomyopathy. -Wean pressors this time -She has been team changed to a limited CODE BLUE with no shock and no chest compressions. -Ongoing discussion with mother concerning prognosis  A fib with RVR. -Amiodarone -Heparin drip   Acute hypoxic respiratory failure. Obstructive sleep apnea on home bipap -Vent settings have been reviewed -O2 saturations via pulse ox read low but her PO2 on arterial blood gas result 170  I suspect due to the high dose of vasoconstrictors they were having inaccurate readings.  We will continue to monitor blood gases and titrate her FiO2 appropriately. -We will not increase positive end-expiratory pressure at this time.  AKI >> Lab Results  Component Value Date   CREATININE 0.78 09/29/2017   CREATININE 0.91 09/28/2017   CREATININE 1.57 (H) 09/28/2017   CREATININE 0.85 09/02/2015   CREATININE 0.80 03/01/2015   CREATININE 0.81 10/19/2014   -Currently on CVVH -pH has improved -Continue bicarbonate drip have spoken with nephrology about this -Appreciate nephrology's input.  Lactic acidosis. -Is improving on 09/29/2017 -Continue to trend lactic acid last one was 3  ?UTI  - no clear source of infection, but could have UTI Empiric meropenem/ levaquin/ vanc Await cx data  Protein calorie malnutrition - start trickle TFs   Disposition / Summary of Today's Plan 09/29/17   Change limited CODE BLUE no shock or chest compressions Continue current vasopressor support Currently on CVVH 100% FiO2 Ongoing discussions with family members considering CODE STATUS.      Code Status: 09/29/2017 after discussion with the mother Giavanni Zeitlin at 0 830 changed to a limited CODE BLUE.  We will continue with vasopressors and mechanical ventilatory support we will not shock her or do cardiopulmonary resuscitation.  I again explained to her the situation extremely grim prognosis is poor. Family Communication: Update pt's mother at bedside  Labs   CBC: Recent Labs  Lab 10/05/2017 0810 Oct 05, 2017 0819 09/28/17 0315 09/29/17 0400  WBC 19.0*  --  25.3* 24.1*  NEUTROABS 14.8*  --   --   --   HGB 15.9* 17.7* 14.5 14.1  HCT 52.0* 52.0* 44.8 45.4  MCV 106.8*  --  101.4* 105.1*  PLT 325  --  204 123*   Basic Metabolic Panel: Recent Labs  Lab 10/05/17 0810 10-05-17 0819 October 05, 2017 1544 09/28/17 0315 09/28/17 2027 09/29/17 0400 09/29/17 0420  NA 132* 130* 136 135 135  --  134*  K  5.9* 5.6* 6.8* 3.7 5.2*  --  4.0  CL 93* 94* 95* 100 103  --  101  CO2 17*  --  19* 21* 21*  --  18*  GLUCOSE 194* 193* 191* 183* 77  --  144*  BUN 27* 34* 28* 21 6*  --  <5*  CREATININE 1.98* 1.80* 2.20* 1.57* 0.91  --  0.78  CALCIUM 9.3  --  8.9 7.3* 7.3*  --  6.8*  MG 2.6*  --   --  2.5*  --  2.2  --   PHOS 7.5*  --   --  4.9* 2.5  --  2.7   GFR: Estimated Creatinine Clearance: 54 mL/min (by C-G formula based on SCr of 0.78 mg/dL). Recent Labs  Lab Oct 05, 2017 0810 2017/10/05 0819 10/05/17 0950 2017/10/05 1240 Oct 05, 2017 1544 09/28/17 0315 09/28/17 1311 09/29/17 0400  PROCALCITON  --   --   --   --  1.04  --   --   --   WBC 19.0*  --   --   --   --  25.3*  --  24.1*  LATICACIDVEN  --  10.38* 10.61* 10.5*  --   --  3.0*  --    Liver Function Tests: Recent Labs  Lab 09/13/2017 0810 09/28/17 0315 09/28/17 2027 09/29/17 0420  AST 216*  --   --   --   ALT 223*  --   --   --   ALKPHOS 105  --   --   --   BILITOT 3.5*  --   --   --   PROT 5.8*  --   --   --   ALBUMIN 3.4* 2.8* 2.7* 2.6*   No results for input(s): LIPASE, AMYLASE in the last 168 hours. No results for input(s): AMMONIA in the last 168 hours. ABG    Component Value Date/Time   PHART 7.311 (L) 09/29/2017 0510   PCO2ART 39.8 09/29/2017 0510   PO2ART 192 (H) 09/29/2017 0510   HCO3 19.5 (L) 09/29/2017 0510   TCO2 29 09/28/2017 1430   ACIDBASEDEF 5.6 (H) 09/29/2017 0510   O2SAT 99.2 09/29/2017 0510    Coagulation Profile: No results for input(s): INR, PROTIME in the last 168 hours. Cardiac Enzymes: Recent Labs  Lab 10/03/2017 1821  CKTOTAL 257*   HbA1C: Hgb A1c MFr Bld  Date/Time Value Ref Range Status  08/28/2014 03:05 PM 5.7 (H) 4.8 - 5.6 % Final    Comment:    (NOTE)         Pre-diabetes: 5.7 - 6.4         Diabetes: >6.4         Glycemic control for adults with diabetes: <7.0    CBG: Recent Labs  Lab 09/28/17 0750 09/28/17 1502 09/28/17 2029 09/28/17 2337 09/29/17 0352  GLUCAP 126* 95 86 103*  162*     App cct 45 min  Brett Canales Stephfon Bovey ACNP [facility] Pager [phone] till 1 pm If no answer page [phone] 09/29/2017, 8:49 AM

## 2017-09-29 NOTE — Progress Notes (Signed)
[facility] ROUNDING NOTE   Subjective:   Acute renal failure with hypotension , lactic acidosis and history of atrial fibrillation with RVR  Cardioversion 09/15/17   Patient has Turners syndrome VSD  SVT and Aortic insufficiency and hypothyroidism She has been started on CRRT and is tolerating this well so far. RHC/LHC 07/20/17 >> EF 50%, severe aortic regurgitation, non obstructive CAD, normal PA pressures, no shunt   Discussed with care team that included Dr. Hochrein and critical care.  Situation appears to be worsening with what appears to be a reversible cardiogenic shock no indication for mechanical support giving her severe aortic insufficiency at baseline.  Discussed with mother, prognosis is poor.  We will continue to provide support with CRRT at this time   Objective:  Vital signs in last 24 hours:  Temp:  [95.5 F (35.3 C)-100.4 F (38 C)] 96.1 F (35.6 C) (09/25 1000) Pulse Rate:  [46-116] 52 (09/25 1000) Resp:  [17-27] 19 (09/25 1000) BP: (67-148)/(31-107) 70/39 (09/25 1000) SpO2:  [81 %-100 %] 97 % (09/25 1000) Arterial Line BP: (75-104)/(38-59) 80/39 (09/25 1000) FiO2 (%):  [40 %-100 %] 100 % (09/25 0813) Weight:  [62.8 kg] 62.8 kg (09/25 0500)  Weight change: 2.9 kg Filed Weights   09/10/2017 1400 09/28/17 0500 09/29/17 0500  Weight: 59.9 kg 62.2 kg 62.8 kg    Intake/Output: I/O last 3 completed shifts: In: 8381.2 [I.V.:7871.2; IV Piggyback:510] Out: 5731 [Urine:30; Other:5549; Blood:152]   Intake/Output this shift:  Total I/O In: 1244.8 [I.V.:1244.8] Out: 1302 [Other:1302]  Thick webbed neck  CVS- RRR RS- CTA ABD- BS present soft non-distended EXT- no edema   Basic Metabolic Panel: Recent Labs  Lab 10/03/2017 0810 09/15/2017 0819 09/23/2017 1544 09/28/17 0315 09/28/17 2027 09/29/17 0400 09/29/17 0420  NA 132* 130* 136 135 135  --  134*  K 5.9* 5.6* 6.8* 3.7 5.2*  --  4.0  CL 93* 94* 95* 100 103  --  101  CO2 17*  --  19* 21* 21*  --   18*  GLUCOSE 194* 193* 191* 183* 77  --  144*  BUN 27* 34* 28* 21 6*  --  <5*  CREATININE 1.98* 1.80* 2.20* 1.57* 0.91  --  0.78  CALCIUM 9.3  --  8.9 7.3* 7.3*  --  6.8*  MG 2.6*  --   --  2.5*  --  2.2  --   PHOS 7.5*  --   --  4.9* 2.5  --  2.7    Liver Function Tests: Recent Labs  Lab 09/23/2017 0810 09/28/17 0315 09/28/17 2027 09/29/17 0420  AST 216*  --   --   --   ALT 223*  --   --   --   ALKPHOS 105  --   --   --   BILITOT 3.5*  --   --   --   PROT 5.8*  --   --   --   ALBUMIN 3.4* 2.8* 2.7* 2.6*   No results for input(s): LIPASE, AMYLASE in the last 168 hours. No results for input(s): AMMONIA in the last 168 hours.  CBC: Recent Labs  Lab 09/05/2017 0810 09/16/2017 0819 09/28/17 0315 09/29/17 0400  WBC 19.0*  --  25.3* 24.1*  NEUTROABS 14.8*  --   --   --   HGB 15.9* 17.7* 14.5 14.1  HCT 52.0* 52.0* 44.8 45.4  MCV 106.8*  --  101.4* 105.1*  PLT 325  --  204 123*      Cardiac Enzymes: Recent Labs  Lab 10/04/2017 1821  CKTOTAL 257*    BNP: Invalid input(s): POCBNP  CBG: Recent Labs  Lab 09/28/17 1209 09/28/17 1502 09/28/17 2029 09/28/17 2337 09/29/17 0352  GLUCAP 100* 95 86 103* 162*    Microbiology: Results for orders placed or performed during the hospital encounter of 09/13/2017  Culture, blood (routine x 2)     Status: None (Preliminary result)   Collection Time: 09/26/2017  8:56 AM  Result Value Ref Range Status   Specimen Description BLOOD RIGHT HAND  Final   Special Requests   Final    BOTTLES DRAWN AEROBIC ONLY Blood Culture results may not be optimal due to an inadequate volume of blood received in culture bottles   Culture   Final    NO GROWTH 1 DAY Performed at [facility], [address] [place], [place] [address]    Report Status PENDING  Incomplete  Culture, blood (routine x 2)     Status: None (Preliminary result)   Collection Time: 09/11/2017  9:10 AM  Result Value Ref Range Status   Specimen Description BLOOD RIGHT  ANTECUBITAL  Final   Special Requests   Final    BOTTLES DRAWN AEROBIC AND ANAEROBIC Blood Culture adequate volume   Culture   Final    NO GROWTH 1 DAY Performed at [facility], [address] , [place] [address]    Report Status PENDING  Incomplete    Coagulation Studies: No results for input(s): LABPROT, INR in the last 72 hours.  Urinalysis: Recent Labs    09/15/2017 1816  COLORURINE AMBER*  LABSPEC 1.021  PHURINE 5.0  GLUCOSEU 150*  HGBUR LARGE*  BILIRUBINUR NEGATIVE  KETONESUR 5*  PROTEINUR 100*  NITRITE NEGATIVE  LEUKOCYTESUR MODERATE*      Imaging: Us Renal  Result Date: 09/16/2017 CLINICAL DATA:  Acute renal failure EXAM: RENAL / URINARY TRACT ULTRASOUND COMPLETE COMPARISON:  None. FINDINGS: Right Kidney: Length: 10.7 cm. Echogenicity within normal limits. No mass or hydronephrosis visualized. Left Kidney: Length: 9.7 cm. Echogenicity within normal limits. No mass or hydronephrosis visualized. Bladder: Decompressed and poorly visualized. IMPRESSION: No sonographic findings to explain the patient's acute renal failure. Cortical-medullary distinction is maintained without evidence of medical renal disease. No obstructive uropathy is noted. Electronically Signed   By: David  Kwon M.D.   On: 09/22/2017 23:18   Dg Chest Port 1 View  Result Date: 09/28/2017 CLINICAL DATA:  Intubation.  Hypoxia. EXAM: PORTABLE CHEST 1 VIEW COMPARISON:  09/29/2017. FINDINGS: Endotracheal tube and left IJ line in stable position. Cardiomegaly with diffuse bilateral pulmonary infiltrates and right pleural effusion. Findings consistent with CHF. Findings have worsened from prior exam. Bilateral pneumonia cannot be excluded. Low lung volumes. No pneumothorax. IMPRESSION: 1.  Lines and tubes stable position. 2. Cardiomegaly with diffuse bilateral pulmonary infiltrates and prominent right pleural effusion. Findings consistent with CHF. Findings have worsened from prior exam. Bilateral  pneumonia cannot be excluded. Low lung volumes. Electronically Signed   By: Thomas  Register   On: 09/28/2017 10:24   Dg Chest Port 1 View  Result Date: 09/30/2017 CLINICAL DATA:  LEFT IJ central line placement. EXAM: PORTABLE CHEST 1 VIEW COMPARISON:  Chest x-ray from earlier same day. FINDINGS: LEFT IJ central line in place with tip at the level of the upper SVC. Endotracheal tube appears well positioned with tip just above the level of the carina. Pacer pad overlies the upper mediastinum. There is stable cardiomegaly with central pulmonary   vascular congestion. Bibasilar opacities are stable, likely small pleural effusions and/or atelectasis. No pneumothorax seen IMPRESSION: 1. LEFT IJ central line in place with tip positioned at the level of the upper SVC. No pneumothorax seen. 2. Endotracheal tube appears well positioned with tip just above the level of the carina. 3. Cardiomegaly with central pulmonary vascular congestion indicating mild CHF/volume overload. 4. Probable small bilateral pleural effusions and/or atelectasis. Electronically Signed   By: Stan  Maynard M.D.   On: 09/11/2017 19:54   Us Ekg Site Rite  Result Date: 09/26/2017 If Site Rite image not attached, placement could not be confirmed due to current cardiac rhythm.    Medications:   . sodium chloride 50 mL/hr at 09/29/17 1000  . sodium chloride    . amiodarone 30 mg/hr (09/29/17 1000)  . famotidine (PEPCID) IV Stopped (09/28/17 2245)  . heparin 10,000 units/ 20 mL infusion syringe 650 Units/hr (09/29/17 0131)  . heparin 250 Units/hr (09/29/17 1000)  . levofloxacin (LEVAQUIN) IV 250 mg (09/28/17 1309)  . norepinephrine (LEVOPHED) Adult infusion 110 mcg/min (09/29/17 1013)  . phenylephrine (NEO-SYNEPHRINE) Adult infusion 400 mcg/min (09/29/17 1000)  . dialysis replacement fluid (prismasate) 700 mL/hr at 09/28/17 2149  . dialysis replacement fluid (prismasate) 800 mL/hr at 09/29/17 0454  . dialysate (PRISMASATE) 2,000 mL/hr  at 09/29/17 0854  . sodium chloride    . vasopressin (PITRESSIN) infusion - *FOR SHOCK* 0.03 Units/min (09/29/17 1000)   . atorvastatin  20 mg Oral Daily  . B-complex with vitamin C  1 tablet Per Tube Daily  . chlorhexidine gluconate (MEDLINE KIT)  15 mL Mouth Rinse BID  . feeding supplement (VITAL HIGH PROTEIN)  1,000 mL Per Tube Q24H  . hydrocortisone sod succinate (SOLU-CORTEF) inj  50 mg Intravenous Q6H  . insulin aspart  0-9 Units Subcutaneous Q4H  . levothyroxine  37.5 mcg Intravenous Daily  . mouth rinse  15 mL Mouth Rinse 10 times per day   Place/Maintain arterial line **AND** sodium chloride, acetaminophen **OR** acetaminophen, dextrose, fentaNYL (SUBLIMAZE) injection, heparin, heparin, midazolam, ondansetron (ZOFRAN) IV, sodium chloride  Assessment/ Plan:   1. Acute renal failure in setting of shock - septic versus cardiogenic with severe aortic regurgitation  Consultation with cardiology. She continues on CRRT at this time 2D echo estimated ejection fraction 30 to 35% severe aortic regurgitation with increased regurgitation pressure half-time.  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  Continuing to discuss with family appropriateness of continued therapy 2. Hypotension  MAP 70's Levophed 145 mcg, Neo-Synephrine 400.  Questioning whether accurate reading of left A-line.  Critical care considering putting in femoral arterial line form more accurate blood pressure readings 3. PAF  On heparin and amiodarone 4. Aortic regurgitation  Severe awaiting cardiology input 5.  Hyperkalemia resolved with CRRT 6. Metabolic acidosis resolved with CRRT 7. Hypothyroidism   Last TSH  8.27 8. Ventilator dependent respiratory failure   9. Empiric levoquin  Questioning UTI as cause     LOS: 2 Martin W Webb @TODAY@10:34 AM  

## 2017-09-29 NOTE — Progress Notes (Signed)
CRITICAL VALUE ALERT  Critical Value:  Lactic Acid 5.8  Date & Time Notied:  9/25 1155  Provider Notified: Devra Dopp, NP and Dr Hyman Hopes  Orders Received/Actions taken: none

## 2017-09-29 NOTE — Consult Note (Signed)
Consultation Note Date: 09/29/2017   Patient Name: [name]  DOB: [birth year]  MRN: [id number]  Age / Sex: 62 y.o., female  PCP: Bernerd Limbo, MD Referring Physician: Rigoberto Noel, MD  Reason for Consultation: Establishing goals of care and Terminal Care  HPI/Patient Profile: 62 y.o. female  with past medical history of Turners syndrome, VSD, SVT, aortic insufficiency, diastolic CHF, CAD, afib, OSA, OA, hypothyroidisim, hypertension admitted on 09/28/2017 with shortness of breath. Patient was hospitalized in September 2019 with afib RVR and severe aortic insufficiency. Cardioversion on 09/15/17 and then bradycardia. RHC/LCH 07/2017 revealed nonobstructive CAD in the mid left circumflex and severe aortic insufficiency. Evaluated by Dr. Cyndia Bent 08/09/17, no recommendation for surgery. 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 Palliative medicine consultation for goals of care.   Clinical Assessment and Goals of Care:  I have extensively reviewed medical records, discussed with care team, and assessed the patient. Patient is critically ill, intubated, and unable to participate in Ruth conversation.   Patient's aunt is at bedside. She tells me [name] is sleeping in family waiting room.    Introduced Palliative Medicine as specialized medical care for people living with serious illness. It focuses on providing relief from the symptoms and stress of a serious illness.   Amberle has been living with her mother [name]). Joei's aunt speaks of her history of Turners syndrome leading to many health concerns throughout life. She speaks of how hard [name] is taking Nickia's critical condition.   Briefly updated on  diagnoses and interventions. Aunt at bedside understands poor prognosis and plans to continue supporting [name] through this.   I have left PMT contact information with aunt at bedside and also niece in family waiting room. Again, [name] is sleeping during my visit. Encouraged family to notify [name] of my visit and ongoing support through this hospitalization.    SUMMARY OF RECOMMENDATIONS    Initial palliative visit. Patient's mother [name]) is sleeping during my visit. She has spoken with multiple doctors from care team this morning. Code status has been changed to limited with plan to continue other interventions at this time.  PMT contact information left with patient's aunt and cousin to notify mother of my visit.   PMT will continue to support patient/family through hospitalization and unfortunate poor prognosis.   Code Status/Advance Care Planning:  Limited code  Symptom Management:   Per attending  Palliative Prophylaxis:   Aspiration, Delirium Protocol, Frequent Pain Assessment, Oral Care and Turn Reposition  Psycho-social/Spiritual:   Desire for further Chaplaincy support:yes  Additional Recommendations: Caregiving  Support/Resources and Compassionate Wean Education  Prognosis:   Unable to determine  Discharge Planning: To Be Determined      Primary Diagnoses: Present on Admission: . Atrial fibrillation with RVR ([place]) . Acute on chronic diastolic (congestive) heart failure ([place]) . Acute renal failure (ARF) ([place]) . Hyperkalemia  I have reviewed the medical record, interviewed the patient and family, and examined the patient. The following aspects are pertinent.  Past Medical History:  Diagnosis Date  . Adiposity 06/04/2011  . Adult hypothyroidism 06/04/2011  . Aortic insufficiency    a. severe by echo, TEE 2019.  [facility] Kitchen Benign hypertensive heart disease without heart failure 10/16/2010  . Bradycardia    a. 09/2017 -> required cessation of metoprolol,  decrease in amiodarone.  Marland Kitchen CAD (coronary artery disease)    a.  [facility] 07/2017 showed severe AI, nonobstructive CAD (50-60% prox-mid Cx, 40% prox-mid LAD, 30% prox RCA), normal PA pressures, no L-R shunt, LVEDP 8mHg, EF 50%..  . Cervix abnormality 1993   MILD ATYPICAL ADENOMATOUS HYPERPLASIA OF CERVIX  . Chronic diastolic CHF (congestive heart failure) ([place] 08/29/2014  . Chronic respiratory failure with hypercapnia ([place] 07/06/2012   [facility] - [facility] c/w hypercarbia. - PSS 08/29/12 Severe obstructive sleep apnea/hypopnea syndrome, with an AHI of 79 > see OSA - Placed on 24h 02 3lpm at d/c 09/04/14 - stopped 12/2014     . Dermatitis 05-26-12   all over  since 9'13  . Difficult intubation    will plan on spinal anesthesia"small mouth/airway"  . Dyspnea   . Edema of both legs    a. right greater than left, due to Turner's syndrome.  . Gagging episode   . H/O total hip arthroplasty 06/04/2011  . Hyperglycemia 12/23/2011  . Hypertension   . Hypothyroidism   . LAD (lymphadenopathy), mediastinal 08/30/2014  . Obesity 09/16/2014  . Orbital floor (blow-out) closed fracture ([place] 08/17/2011  . OSA (obstructive sleep apnea) 09/10/2012   NPSG 08/29/12:  Severe obstructive sleep apnea/hypopnea syndrome, with an AHI of 79  06/2016 Bipap 21/17  . Osteoarthritis 10/16/2010   Overview:  Dr. OAlvan Dame(hips and knees)   . PAF (paroxysmal atrial fibrillation) ([place]   . Periorbital edema   . 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 Severe aortic insufficiency   . Speech disorder    occ. "stutter"  . Supraventricular tachycardia ([place] 08/27/2014  . Turner syndrome 06/04/2011  . Turner's syndrome   . VSD (ventricular septal defect)    a. ? by TEE 06/2017, not seen on cardiac CT and [facility]   Social History   Socioeconomic History  . Marital status: Single    Spouse name: Not on file  . Number of children: Not on file  . Years of  education: Not on file  . Highest education level: Not on file  Occupational History  . 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 Financial resource strain: Not on file  . Food insecurity:    Worry: Not on file    Inability: Not on file  . Transportation needs:    Medical: Not on file    Non-medical: Not on file  Tobacco Use  . Smoking status: Never Smoker  . Smokeless tobacco: Never Used  Substance and Sexual Activity  . Alcohol use: No  . Drug use: No  . Sexual activity: Never    Birth control/protection: Post-menopausal  Lifestyle  . Physical activity:    Days per week: Not on file    Minutes per session: Not on file  . Stress: Not on file  Relationships  . Social connections:    Talks on phone: Not on file    Gets together: Not  on file    Attends religious service: Not on file    Active member of club or organization: Not on file    Attends meetings of clubs or organizations: Not on file    Relationship status: Not on file  Other Topics Concern  . Not on file  Social History Narrative   This with her mother, who cares for her.   Family History  Problem Relation Age of Onset  . Hypertension Mother   . Cancer Mother        SKIN CANCER  . Atrial fibrillation Mother   . Breast cancer Cousin        MATERNAL COUSIN  . Diabetes Maternal Uncle    Scheduled Meds: . atorvastatin  20 mg Oral Daily  . B-complex with vitamin C  1 tablet Per Tube Daily  . chlorhexidine gluconate (MEDLINE KIT)  15 mL Mouth Rinse BID  . feeding supplement (VITAL HIGH PROTEIN)  1,000 mL Per Tube Q24H  . hydrocortisone sod succinate (SOLU-CORTEF) inj  50 mg Intravenous Q6H  . insulin aspart  0-9 Units Subcutaneous Q4H  . levothyroxine  37.5 mcg Intravenous Daily  . mouth rinse  15 mL Mouth Rinse 10 times per day   Continuous Infusions: . sodium chloride 50 mL/hr at 09/29/17 1100  . sodium chloride    . amiodarone 30 mg/hr (09/29/17 1100)  . famotidine (PEPCID) IV  Stopped (09/28/17 2245)  . heparin 10,000 units/ 20 mL infusion syringe 650 Units/hr (09/29/17 0131)  . heparin 250 Units/hr (09/29/17 1100)  . levofloxacin (LEVAQUIN) IV 250 mg (09/28/17 1309)  . norepinephrine (LEVOPHED) Adult infusion 110 mcg/min (09/29/17 1100)  . phenylephrine (NEO-SYNEPHRINE) Adult infusion 400 mcg/min (09/29/17 1100)  . dialysis replacement fluid (prismasate) 700 mL/hr at 09/28/17 2149  . dialysis replacement fluid (prismasate) 800 mL/hr at 09/29/17 0454  . dialysate (PRISMASATE) 2,000 mL/hr at 09/29/17 0854  . sodium chloride    . vasopressin (PITRESSIN) infusion - *FOR SHOCK* 0.03 Units/min (09/29/17 1100)   PRN Meds:.Place/Maintain arterial line **AND** sodium chloride, acetaminophen **OR** acetaminophen, dextrose, fentaNYL (SUBLIMAZE) injection, heparin, heparin, midazolam, ondansetron (ZOFRAN) IV, sodium chloride Medications Prior to Admission:  Prior to Admission medications   Medication Sig Start Date End Date Taking? Authorizing Provider  acetaminophen (TYLENOL) 500 MG tablet Take 1,000 mg by mouth every 6 (six) hours as needed for moderate pain or headache.   Yes [provider]  amiodarone (PACERONE) 200 MG tablet Take 1 tablet (200 mg total) by mouth daily. 09/16/17  Yes Dunn, Dayna N, PA-C  atorvastatin (LIPITOR) 20 MG tablet Take 1 tablet (20 mg total) by mouth daily. 03/17/17 03/12/18 Yes Weaver, Scott T, PA-C  cholecalciferol (VITAMIN D) 1000 units tablet Take 1,000 Units by mouth daily.   Yes [provider]  ELIQUIS 5 MG TABS tablet TAKE 1 TABLET BY MOUTH TWICE DAILY Patient taking differently: Take 5 mg by mouth 2 (two) times daily.  07/23/17  Yes Nahser, Wonda Cheng, MD  famotidine (PEPCID) 40 MG tablet Take 40 mg by mouth 2 (two) times daily.   Yes [provider]  furosemide (LASIX) 40 MG tablet Take 1 tablet (40 mg total) by mouth 2 (two) times daily. 09/08/17 12/07/17 Yes Nahser, Wonda Cheng, MD  levothyroxine (SYNTHROID, LEVOTHROID)  75 MCG tablet Take 1 tablet (75 mcg total) by mouth daily before breakfast. 08/21/17  Yes Isabelle Course, MD  potassium chloride (K-DUR,KLOR-CON) 10 MEQ tablet Take 1 tablet (10 mEq total) by mouth 2 (two) times  daily. 09/08/17  Yes Nahser, Wonda Cheng, MD  spironolactone (ALDACTONE) 25 MG tablet Take one half tablet (12.5 mg) daily Patient taking differently: Take 12.5 mg by mouth daily.  08/31/17  Yes Nahser, Wonda Cheng, MD  traMADol (ULTRAM) 50 MG tablet Take 50 mg by mouth every 6 (six) hours as needed for moderate pain.    Yes [provider]   Allergies  Allergen Reactions  . Metoprolol     In 09/2017, developed severe bradycardia post cardioversion and required cessation of beta blocker. She is not allergic, but should be noted that she has h/o significant bradycardia.  . Cephalexin Rash and Other (See Comments)    Prefers not to have ? Psoriasis skin condition  . Tape Rash    At times develops light rash with some itching to adhesive areas of band-aids   Review of Systems  Unable to perform ROS: Acuity of condition   Physical Exam  Constitutional: She appears ill. She is sedated and intubated.  HENT:  Head: Normocephalic and atraumatic.  Cardiovascular: Regular rhythm.  bigeminy  Pulmonary/Chest: No accessory muscle usage. No tachypnea. She is intubated. No respiratory distress.  Nursing note and vitals reviewed.  Vital Signs: BP (!) 67/30   Pulse (!) 52   Temp (!) 96.1 F (35.6 C)   Resp (!) 21   Wt 62.8 kg   SpO2 97%   BMI 31.04 kg/m  Pain Scale: CPOT   Pain Score: 0-No pain   SpO2: SpO2: 97 % O2 Device:SpO2: 97 % O2 Flow Rate: .O2 Flow Rate (L/min): 4 L/min  IO: Intake/output summary:   Intake/Output Summary (Last 24 hours) at 09/29/2017 1124 Last data filed at 09/29/2017 1100 Gross per 24 hour  Intake 7722.82 ml  Output 6495 ml  Net 1227.82 ml    LBM: Last BM Date: (PTA) Baseline Weight: Weight: 59.9 kg Most recent weight: Weight: 62.8 kg       Palliative Assessment/Data: PPS 20%     Time In: 1050 Time Out: 1125 Time Total: 35 min Greater than 50%  of this time was spent counseling and coordinating care related to the above assessment and plan.  Signed by:  Ihor Dow, FNP-C Palliative Medicine Team  Phone: [phone] Fax: [phone]   Please contact Palliative Medicine Team phone at [phone] for questions and concerns.  For individual provider: See Shea Evans

## 2017-09-29 NOTE — Progress Notes (Signed)
ANTICOAGULATION CONSULT NOTE - Follow Up Consult  Pharmacy Consult for Heparin (Apixaban on hold) Indication: atrial fibrillation  Allergies  Allergen Reactions  . Metoprolol     In 09/2017, developed severe bradycardia post cardioversion and required cessation of beta blocker. She is not allergic, but should be noted that she has h/o significant bradycardia.  . Cephalexin Rash and Other (See Comments)    Prefers not to have ? Psoriasis skin condition  . Tape Rash    At times develops light rash with some itching to adhesive areas of band-aids   Patient Measurements: Height: 4\' 8"  (142.2 cm) Weight: 138 lb 7.2 oz (62.8 kg) IBW/kg (Calculated) : 36.3  Vital Signs: Temp: 96.6 F (35.9 C) (09/25 2100) Temp Source: Bladder (09/25 1200) BP: 83/58 (09/25 2000) Pulse Rate: 80 (09/25 2100)  Labs: Recent Labs    09/25/2017 0810  09/26/2017 1821 09/28/17 0315  09/28/17 2027 09/29/17 0400 09/29/17 0420 09/29/17 1414 09/29/17 1517 09/29/17 1519 09/29/17 1947 09/29/17 1948  HGB 15.9*   < >  --  14.5  --   --  14.1  --   --   --  15.3*  --   --   HCT 52.0*   < >  --  44.8  --   --  45.4  --   --   --  45.0  --   --   PLT 325  --   --  204  --   --  123*  --   --   --   --   --   --   APTT  --   --   --  >200*   < > 118* >200*  --  151*  --   --   --  168*  HEPARINUNFRC  --   --   --  >2.20*   < > 2.16*  --  1.50*  --   --   --  1.10*  --   CREATININE 1.98*   < >  --  1.57*  --  0.91  --  0.78  --  0.56 0.50  --   --   CKTOTAL  --   --  257*  --   --   --   --   --   --   --   --   --   --    < > = values in this interval not displayed.    Estimated Creatinine Clearance: 54 mL/min (by C-G formula based on SCr of 0.5 mg/dL).  Assessment: 61yo female on apixaban prior to admission for afib; last dose 9/22 PM. She has been on systemic heparin and this was held earlier today due to an elevated aPTT and heparin level. She is still on heparin via CRRT. The a-line had to be removed earlier  and reported profuse bleeding at the site and this has stopped (around 2pm)  -heparin level= 1.1 (trend down; possible effect of recent apixaban but seems high considering the last dose was 9/22) -aPTT= 168 (would be above goal but not clear if this is reliable) -ACT= 190 (up and in goal per protocol)  Spoke with Dr. Juel Burrow: continue heparin through CRRT.    Goal of Therapy:  Heparin level 0.3-0.7 units/ml aPTT 66-102 seconds Monitor platelets by anticoagulation protocol: Yes   Plan:  -With elevated heparin level and aPTT, will not add systemic heparin  -Given that heparin has been off since 2pm and aPTT is up will wait  until morning to check labs (aPTT and heparin level) -Watch closely for bleeding  Harland German, PharmD Clinical Pharmacist Please check Amion for pharmacy contact number

## 2017-09-30 ENCOUNTER — Inpatient Hospital Stay (HOSPITAL_COMMUNITY): Payer: Medicaid Other

## 2017-09-30 DIAGNOSIS — Z7189 Other specified counseling: Secondary | ICD-10-CM

## 2017-09-30 LAB — CBC WITH DIFFERENTIAL/PLATELET
BASOS ABS: 0 10*3/uL (ref 0.0–0.1)
BASOS PCT: 0 %
Eosinophils Absolute: 0.2 10*3/uL (ref 0.0–0.7)
Eosinophils Relative: 1 %
HCT: 39.2 % (ref 36.0–46.0)
Hemoglobin: 12.1 g/dL (ref 12.0–15.0)
LYMPHS ABS: 0.4 10*3/uL — AB (ref 0.7–4.0)
Lymphocytes Relative: 2 %
MCH: 32.3 pg (ref 26.0–34.0)
MCHC: 30.9 g/dL (ref 30.0–36.0)
MCV: 104.5 fL — AB (ref 78.0–100.0)
MONO ABS: 0.8 10*3/uL (ref 0.1–1.0)
Monocytes Relative: 4 %
NEUTROS ABS: 19.8 10*3/uL — AB (ref 1.7–7.7)
Neutrophils Relative %: 93 %
PLATELETS: 54 10*3/uL — AB (ref 150–400)
RBC: 3.75 MIL/uL — ABNORMAL LOW (ref 3.87–5.11)
RDW: 14 % (ref 11.5–15.5)
WBC: 21.2 10*3/uL — ABNORMAL HIGH (ref 4.0–10.5)

## 2017-09-30 LAB — GLUCOSE, CAPILLARY
GLUCOSE-CAPILLARY: 119 mg/dL — AB (ref 70–99)
GLUCOSE-CAPILLARY: 133 mg/dL — AB (ref 70–99)
GLUCOSE-CAPILLARY: 144 mg/dL — AB (ref 70–99)
GLUCOSE-CAPILLARY: 147 mg/dL — AB (ref 70–99)
Glucose-Capillary: 112 mg/dL — ABNORMAL HIGH (ref 70–99)
Glucose-Capillary: 117 mg/dL — ABNORMAL HIGH (ref 70–99)
Glucose-Capillary: 121 mg/dL — ABNORMAL HIGH (ref 70–99)

## 2017-09-30 LAB — LACTIC ACID, PLASMA: Lactic Acid, Venous: 4.8 mmol/L (ref 0.5–1.9)

## 2017-09-30 LAB — HEPARIN LEVEL (UNFRACTIONATED)
HEPARIN UNFRACTIONATED: 0.87 [IU]/mL — AB (ref 0.30–0.70)
Heparin Unfractionated: 0.62 IU/mL (ref 0.30–0.70)

## 2017-09-30 LAB — POCT ACTIVATED CLOTTING TIME
ACTIVATED CLOTTING TIME: 191 s
Activated Clotting Time: 186 seconds
Activated Clotting Time: 191 seconds
Activated Clotting Time: 191 seconds
Activated Clotting Time: 191 seconds
Activated Clotting Time: 197 seconds

## 2017-09-30 LAB — RENAL FUNCTION PANEL
ANION GAP: 9 (ref 5–15)
Albumin: 3 g/dL — ABNORMAL LOW (ref 3.5–5.0)
Albumin: 2.7 g/dL — ABNORMAL LOW (ref 3.5–5.0)
Anion gap: 9 (ref 5–15)
BUN: <5 mg/dL — ABNORMAL LOW (ref 8–23)
CALCIUM: 7.1 mg/dL — AB (ref 8.9–10.3)
CALCIUM: 7.3 mg/dL — AB (ref 8.9–10.3)
CHLORIDE: 105 mmol/L (ref 98–111)
CO2: 21 mmol/L — AB (ref 22–32)
CO2: 23 mmol/L (ref 22–32)
CREATININE: 0.55 mg/dL (ref 0.44–1.00)
Chloride: 106 mmol/L (ref 98–111)
Creatinine, Ser: 0.46 mg/dL (ref 0.44–1.00)
GFR calc non Af Amer: >60 mL/min (ref >60)
GLUCOSE: 140 mg/dL — AB (ref 70–99)
Glucose, Bld: 115 mg/dL — ABNORMAL HIGH (ref 70–99)
Phosphorus: 1.8 mg/dL — ABNORMAL LOW (ref 2.5–4.6)
Phosphorus: 1.6 mg/dL — ABNORMAL LOW (ref 2.5–4.6)
Potassium: 4.3 mmol/L (ref 3.5–5.1)
Potassium: 4.1 mmol/L (ref 3.5–5.1)
SODIUM: 136 mmol/L (ref 135–145)
SODIUM: 137 mmol/L (ref 135–145)

## 2017-09-30 LAB — COOXEMETRY PANEL
CARBOXYHEMOGLOBIN: 1 % (ref 0.5–1.5)
CARBOXYHEMOGLOBIN: 1.3 % (ref 0.5–1.5)
Methemoglobin: 1.6 % — ABNORMAL HIGH (ref 0.0–1.5)
Methemoglobin: 1.4 % (ref 0.0–1.5)
O2 SAT: 45.9 %
O2 Saturation: 64.4 %
Total hemoglobin: 12 g/dL (ref 12.0–16.0)
Total hemoglobin: 12.3 g/dL (ref 12.0–16.0)

## 2017-09-30 LAB — POCT I-STAT 3, ART BLOOD GAS (G3+)
Acid-base deficit: 3 mmol/L — ABNORMAL HIGH (ref 0.0–2.0)
Bicarbonate: 21.6 mmol/L (ref 20.0–28.0)
O2 Saturation: 94 %
TCO2: 23 mmol/L (ref 22–32)
pCO2 arterial: 36.6 mmHg (ref 32.0–48.0)
pH, Arterial: 7.38 (ref 7.350–7.450)
pO2, Arterial: 72 mmHg — ABNORMAL LOW (ref 83.0–108.0)

## 2017-09-30 LAB — APTT: APTT: 68 s — AB (ref 24–36)

## 2017-09-30 LAB — MAGNESIUM: MAGNESIUM: 2.1 mg/dL (ref 1.7–2.4)

## 2017-09-30 MED ORDER — "THROMBI-PAD 3""X3"" EX PADS"
1.0000 | MEDICATED_PAD | Freq: Once | CUTANEOUS | Status: AC
  Administered 2017-09-30: 1 via TOPICAL
  Filled 2017-09-30: qty 1

## 2017-09-30 MED ORDER — SODIUM CHLORIDE 0.9% FLUSH: 10.0000 mL | INTRAVENOUS | Status: DC | PRN

## 2017-09-30 MED ORDER — AMIODARONE HCL 200 MG PO TABS
200.0000 mg | ORAL_TABLET | Freq: Every day | ORAL | Status: DC
  Administered 2017-09-30 – 2017-10-02 (×3): 200 mg via ORAL
  Filled 2017-09-30 (×3): qty 1

## 2017-09-30 MED ORDER — CHLORHEXIDINE GLUCONATE CLOTH 2 % EX PADS
6.0000 | MEDICATED_PAD | Freq: Every day | CUTANEOUS | Status: DC
  Administered 2017-10-01 – 2017-10-02 (×2): 6 via TOPICAL

## 2017-09-30 MED ORDER — SODIUM CHLORIDE 0.9% FLUSH
10.0000 mL | Freq: Two times a day (BID) | INTRAVENOUS | Status: DC
  Administered 2017-10-01 – 2017-10-02 (×3): 10 mL

## 2017-09-30 MED ORDER — SODIUM PHOSPHATES 45 MMOLE/15ML IV SOLN
20.0000 mmol | Freq: Once | INTRAVENOUS | Status: AC
  Administered 2017-09-30: 20 mmol via INTRAVENOUS
  Filled 2017-09-30: qty 6.67

## 2017-09-30 NOTE — Progress Notes (Signed)
Pharmacy Heparin Induced Thrombocytopenia (HIT) Note:  [name] is a 62 y.o. 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 Heparin was started for history of afib/within CRRT machine, and baseline platelets were 325. HIT antibody was ordered on 9/26 when platelets dropped to 54.     Score = 0 Score = 1 Score = 2 Calculated Score  Thrombocytopenia -Platelet fall < 30% -Any platelet fall with nadir < 10 -Platelet fall >50% BUT surgery within 3 days  -Any combination of platelet fall/nadir that does not fit score 0 or 2 -Platelet fall >50% AND nadir ?20 AND no surgery within 3 days     2  Timing -Platelets fall on days ? 4  AND no prior heparin exposure in previous 100 days -Consistent w/ platelet fall on days 5-10 but missing counts -Platelet fall within 1 day of start AND prior exposure to heparin within previous 31-100 days -Platelet fall after day 10 -Platelet fall 5-10 days after starting heparin -Platelet fall within 1 day of heparin start AND prior exposure within previous 5-30 days 1  Thrombosis -None suspected -Recurrent thrombosis in patient receiving therapeutic anticoagulants -Suspected thrombosis (awaiting confirmation via imaging) -Erythematous skin lesions at heparin injection sites -Confirmed new thrombosis -Skin necrosis at injection site -Anaphylactoid reaction to heparin IV bolus -Adrenal hemorrhage 0  [place] Causes -Probable other cause (see protocol for full list) -Possible other cause evident (see protocol for full list) -No alternative explanation 1  Total Calculated 4T Score:      4   Name of MD Contacted: Dr. Hyman Hopes and Dr. Denese Killings   Plan:  Heparin stopped in CRRT. Heparin antibody is pending. Will discuss with CCM on need to start anticoagulation for HIT.

## 2017-09-30 NOTE — Progress Notes (Signed)
Daily Progress Note   Patient Name: [name]       Date: 09/30/2017 DOB: 1955-05-17  Age: 61 y.o. MRN#: [id number] Attending Physician: Kipp Brood, MD Primary Care Physician: Bernerd Limbo, MD Admit Date: 09/26/2017  Reason for Consultation/Follow-up: Establishing goals of care  Subjective: Patient intubated. Will open eyes to voice. No s/s of distress.   GOC:   Met with mother, [name], at bedside. Introduced Palliative Medicine as specialized medical care for people living with serious illness. It focuses on providing relief from the symptoms and stress of a serious illness.  Also introduced myself to the patient's uncle, [name] at bedside.   We discussed a brief life review of the patient. [name] shares [name]'s challenges throughout life with history of Turner's syndrome. [name] shares that she has been with [name] "since she was conceived" and that they are never apart. She shares [name]'s love for music and her family.   Discussed course of hospitalization including diagnoses, interventions, and guarded prognosis. [name] understands how sick Donna is. She speaks of fearing she wasn't going to survive the night when she came to the hospital. She has had multiple conversations with the care team.   [name] asked how [name] is doing today. I explained that she is showing small improvement with stable vitals and ability to start weaning pressors. I did emphasize to [name] that [name] is still very sick with many concerns that we are aggressively managing. I did confirm the limited code status.    [name] is very thankful to the care team for taking such good care of [name]. She cannot imagine life without [name]. [name] shares the family's deep [name] faith, and that they are hoping and praying that  [name] will continue to show improvement. [name] wishes to continue current interventions with hope that [name] will get better. 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 PMT contact information given. PMT will continue to support holistically.   Length of Stay: 3  Current Medications: Scheduled Meds:  . atorvastatin  20 mg Oral Daily  . B-complex with vitamin C  1 tablet Per Tube Daily  . chlorhexidine gluconate (MEDLINE KIT)  15 mL Mouth Rinse BID  . feeding supplement (VITAL HIGH PROTEIN)  1,000 mL Per Tube Q24H  . hydrocortisone sod succinate (  SOLU-CORTEF) inj  50 mg Intravenous Q6H  . insulin aspart  0-9 Units Subcutaneous Q4H  . levothyroxine  37.5 mcg Intravenous Daily  . mouth rinse  15 mL Mouth Rinse 10 times per day  . THROMBI-PAD  1 each Topical Once    Continuous Infusions: . sodium chloride Stopped (09/30/17 1129)  . sodium chloride    . amiodarone 30 mg/hr (09/30/17 1300)  . famotidine (PEPCID) IV Stopped (09/29/17 2210)  . levofloxacin (LEVAQUIN) IV Stopped (09/30/17 1229)  . norepinephrine (LEVOPHED) Adult infusion 35 mcg/min (09/30/17 1300)  . phenylephrine (NEO-SYNEPHRINE) Adult infusion 100 mcg/min (09/30/17 1300)  . dialysis replacement fluid (prismasate) 700 mL/hr at 09/30/17 1139  . dialysis replacement fluid (prismasate) 800 mL/hr at 09/30/17 1303  . dialysate (PRISMASATE) 2,000 mL/hr at 09/30/17 1302  . sodium chloride    . vasopressin (PITRESSIN) infusion - *FOR SHOCK* 0.03 Units/min (09/30/17 1300)    PRN Meds: Place/Maintain arterial line **AND** sodium chloride, acetaminophen **OR** acetaminophen, dextrose, fentaNYL (SUBLIMAZE) injection, midazolam, ondansetron (ZOFRAN) IV, sodium chloride  Physical Exam  Constitutional: She appears ill. She is intubated.  HENT:  Head: Normocephalic and atraumatic.  Cardiovascular: Regular rhythm.  Pulmonary/Chest: No accessory muscle usage. No tachypnea. She is intubated. No  respiratory distress.  Abdominal: There is no tenderness.  Neurological:  Will open eyes to voice.  Skin: Skin is warm and dry.  Psychiatric: She is inattentive.           Vital Signs: BP (!) 122/55   Pulse 91   Temp (!) 96.3 F (35.7 C)   Resp (!) 23   Ht 4' 8"  (1.422 m)   Wt 62.5 kg   SpO2 91%   BMI 30.89 kg/m  SpO2: SpO2: 91 % O2 Device: O2 Device: Ventilator O2 Flow Rate: O2 Flow Rate (L/min): 4 L/min  Intake/output summary:   Intake/Output Summary (Last 24 hours) at 09/30/2017 1423 Last data filed at 09/30/2017 1300 Gross per 24 hour  Intake 5322.03 ml  Output 5281 ml  Net 41.03 ml   LBM: Last BM Date: (PTA) Baseline Weight: Weight: 59.9 kg Most recent weight: Weight: 62.5 kg       Palliative Assessment/Data: PPS 20%     Patient Active Problem List   Diagnosis Date Noted  . Palliative care by specialist   . Acute on chronic diastolic (congestive) heart failure ([place]) 09/09/2017  . Acute renal failure (ARF) ([place]) 09/17/2017  . Hyperkalemia 10/03/2017  . Shock circulatory ([place])   . Lactic acid acidosis   . Encounter for central line placement   . Bradycardia 09/15/2017  . Atrial fibrillation with RVR ([place]) 08/16/2017  . Moderate aortic insufficiency   . Ventricular septal defect   . PAF (paroxysmal atrial fibrillation) ([place]) 10/12/2014  . Chronic diastolic CHF (congestive heart failure) ([place]) 10/12/2014  . Gagging episode   . Obesity 09/16/2014  . Dyspnea   . Lung nodule 08/30/2014  . LAD (lymphadenopathy), mediastinal 08/30/2014  . Acute respiratory failure with hypoxia ([place]) 08/29/2014  . Supraventricular tachycardia ([place]) 08/27/2014  . Persistent atrial fibrillation ([place]) 08/27/2014  . OSA (obstructive sleep apnea) 09/10/2012  . Chronic respiratory failure with hypercapnia ([place]) 07/06/2012  . Obese 06/01/2012  . 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 Hyperglycemia 12/23/2011  . Orbital floor (blow-out) closed fracture ([place]) 08/17/2011  . H/O total hip  arthroplasty 06/04/2011  . Adult hypothyroidism 06/04/2011  . Adiposity 06/04/2011  . Turner syndrome 06/04/2011  . Cervix abnormality   . Benign hypertensive heart disease without  heart failure 10/16/2010  . Aortic insufficiency 10/16/2010  . Osteoarthritis 10/16/2010    Palliative Care Assessment & Plan   Patient Profile: 62 y.o. female  with past medical history of Turners syndrome, VSD, SVT, aortic insufficiency, diastolic CHF, CAD, afib, OSA, OA, hypothyroidisim, hypertension admitted on 09/05/2017 with shortness of breath. Patient was hospitalized in September 2019 with afib RVR and severe aortic insufficiency. Cardioversion on 09/15/17 and then bradycardia. RHC/LCH 07/2017 revealed nonobstructive CAD in the mid left circumflex and severe aortic insufficiency. Evaluated by Dr. Cyndia Bent 08/09/17, no recommendation for surgery. 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 Palliative medicine consultation for goals of care.   Assessment: Shock-septic versus cardiogenic Severe aortic insufficiency Acute on chronic diastolic CHF Afib with RVR Acute hypoxic respiratory failure Acute kidney injury Lactic acidosis ? UTI  Recommendations/Plan:  GOC with mother, [name], at bedside.   Continue partial code status and current medical interventions. Mother understands she has shown small improvement in clinical status today but I emphasized that Lorissa is still very sick. Mother hoping and praying that Elvi will show improvement.  PMT will continue to support patient/family.   Code Status: Partial    Code Status Orders  (From admission, onward)         Start     Ordered   09/29/17 0837  Limited resuscitation (code)  Continuous    Question Answer Comment  In the event of cardiac or  respiratory ARREST: Initiate Code Blue, Call Rapid Response No   In the event of cardiac or respiratory ARREST: Perform CPR No   In the event of cardiac or respiratory ARREST: Perform Intubation/Mechanical Ventilation Yes   In the event of cardiac or respiratory ARREST: Use NIPPV/BiPAp only if indicated No   In the event of cardiac or respiratory ARREST: Administer ACLS medications if indicated Yes   In the event of cardiac or respiratory ARREST: Perform Defibrillation or Cardioversion if indicated No      09/29/17 0838        Code Status History    Date Active Date Inactive Code Status Order ID Comments User Context   09/10/2017 1003 09/29/2017 0838 Full Code [id number]  Chesley Mires, MD ED   09/15/2017 1704 09/16/2017 1747 Full Code [id number]  Jettie Booze, MD Inpatient   09/15/2017 1700 09/15/2017 1704 Full Code [id number]  Skeet Latch, MD Inpatient   08/16/2017 2222 08/20/2017 1915 Full Code [id number]  Kathi Ludwig, MD ED   07/20/2017 1007 07/20/2017 1602 Full Code [id number]  Belva Crome, MD Inpatient   10/12/2014 1524 10/16/2014 1537 Full Code [id number]  Melton Alar, PA-C Inpatient   08/27/2014 1955 09/04/2014 1925 Full Code [id number]  Etta Quill, DO ED   05/31/2012 1309 06/03/2012 1938 Full Code [id number]  Babish, Lucille Passy, PA-C Inpatient       Prognosis:   Unable to determine: Guarded to poor prognosis with septic vs cardiogenic shock, severe aortic insufficiency, acute on chronic CHF, afib RVR, acute respiratory failure intubated on ventilator, acute kidney injury requiring CRRT.   Discharge Planning:  To Be Determined  Care plan was discussed with RN, patient's family at bedside including mother and uncle.   Thank you for allowing the Palliative Medicine Team to assist in the care of this patient.   Time In: 1200 Time Out: 1245 Total Time 1mn Prolonged  Time Billed no      Greater than 50%  of this time was spent counseling and coordinating  care related to the above assessment and plan.  Ihor Dow, FNP-C Palliative Medicine Team  Phone: [phone] Fax: [phone]  Please contact Palliative Medicine Team phone at [phone] for questions and concerns.

## 2017-09-30 NOTE — Progress Notes (Signed)
CRITICAL VALUE ALERT  Critical Value:  Lactic acid 4.8  Date & Time Notied: 09/30/2017 12:40pm   Provider Notified: Herbert [facility]pires [facility] MD  Orders Received/Actions taken: MD reported he would come see patient

## 2017-09-30 NOTE — Progress Notes (Signed)
[facility] ROUNDING NOTE   Subjective:   Acute renal failure with hypotension , lactic acidosis and history of atrial fibrillation with RVR  Cardioversion 09/15/17   Patient has Turners syndrome VSD  SVT and Aortic insufficiency and hypothyroidism She has been started on CRRT and is tolerating this well so far. RHC/LHC 07/20/17 >> EF 50%, severe aortic regurgitation, non obstructive CAD, normal PA pressures, no shunt   Patient is shaking her head to yes and no questions.  Continues phenylephrine, vasopressin and neoepinephrine.  She is also on an amiodarone drip.  Anuric with no urine output.  Map 92.  Pulse 84 temp 96.  O2 sats 100% FiO2 60%  Renal ultrasound no hydronephrosis.  Chest x-ray revealed by bilateral subsegmental atelectasis with small pleural effusions.   Objective:  Vital signs in last 24 hours:  Temp:  [96.1 F (35.6 C)-97.9 F (36.6 C)] 96.6 F (35.9 C) (09/26 1000) Pulse Rate:  [80-114] 88 (09/26 1000) Resp:  [16-31] 18 (09/26 1000) BP: (50-190)/(16-178) 104/84 (09/26 1000) SpO2:  [83 %-100 %] 100 % (09/26 1045) Arterial Line BP: (76-104)/(30-56) 78/30 (09/25 1800) FiO2 (%):  [60 %-100 %] 60 % (09/26 1045) Weight:  [62.5 kg] 62.5 kg (09/26 0340)  Weight change: -0.3 kg Filed Weights   09/28/17 0500 09/29/17 0500 09/30/17 0340  Weight: 62.2 kg 62.8 kg 62.5 kg    Intake/Output: I/O last 3 completed shifts: In: 58850.2 [I.V.:11228.4; Other:100; NG/GT:180; IV Piggyback:155.1] Out: 10606 [Urine:5; Other:10601]   Intake/Output this shift:  Total I/O In: 596.1 [I.V.:596.1] Out: 645 [Other:645]  Thick webbed neck  CVS- RRR RS- CTA ABD- BS present soft non-distended EXT- no edema   Basic Metabolic Panel: Recent Labs  Lab 09/14/2017 0810  09/28/17 0315 09/28/17 2027 09/29/17 0400 09/29/17 0420 09/29/17 1517 09/29/17 1519 09/30/17 0400  NA 132*   < > 135 135  --  134* 136 135 136  K 5.9*   < > 3.7 5.2*  --  4.0 5.2* 4.1 4.3  CL 93*   <  > 100 103  --  101 103 100 106  CO2 17*   < > 21* 21*  --  18* 21*  --  21*  GLUCOSE 194*   < > 183* 77  --  144* 133* 145* 115*  BUN 27*   < > 21 6*  --  <5* <5* <3* <5*  CREATININE 1.98*   < > 1.57* 0.91  --  0.78 0.56 0.50 0.55  CALCIUM 9.3   < > 7.3* 7.3*  --  6.8* 7.0*  --  7.1*  MG 2.6*  --  2.5*  --  2.2  --   --   --  2.1  PHOS 7.5*  --  4.9* 2.5  --  2.7 1.8*  --  1.8*   < > = values in this interval not displayed.    Liver Function Tests: Recent Labs  Lab 09/10/2017 0810 09/28/17 0315 09/28/17 2027 09/29/17 0420 09/29/17 1517 09/30/17 0400  AST 216*  --   --   --   --   --   ALT 223*  --   --   --   --   --   ALKPHOS 105  --   --   --   --   --   BILITOT 3.5*  --   --   --   --   --   PROT 5.8*  --   --   --   --   --  ALBUMIN 3.4* 2.8* 2.7* 2.6* 2.7* 3.0*   No results for input(s): LIPASE, AMYLASE in the last 168 hours. No results for input(s): AMMONIA in the last 168 hours.  CBC: Recent Labs  Lab 09/21/2017 0810 09/22/2017 0819 09/28/17 0315 09/29/17 0400 09/29/17 1519 09/30/17 0400  WBC 19.0*  --  25.3* 24.1*  --  21.2*  NEUTROABS 14.8*  --   --   --   --  19.8*  HGB 15.9* 17.7* 14.5 14.1 15.3* 12.1  HCT 52.0* 52.0* 44.8 45.4 45.0 39.2  MCV 106.8*  --  101.4* 105.1*  --  104.5*  PLT 325  --  204 123*  --  54*    Cardiac Enzymes: Recent Labs  Lab 09/06/2017 1821  CKTOTAL 257*    BNP: Invalid input(s): POCBNP  CBG: Recent Labs  Lab 09/29/17 1517 09/29/17 1940 09/30/17 0001 09/30/17 0356 09/30/17 0749  GLUCAP 141* 123* 112* 119* 121*    Microbiology: Results for orders placed or performed during the hospital encounter of 09/30/2017  Culture, blood (routine x 2)     Status: None (Preliminary result)   Collection Time: 09/16/2017  8:56 AM  Result Value Ref Range Status   Specimen Description BLOOD RIGHT HAND  Final   Special Requests   Final    BOTTLES DRAWN AEROBIC ONLY Blood Culture results may not be optimal due to an inadequate volume of  blood received in culture bottles   Culture   Final    NO GROWTH 3 DAYS Performed at [facility], [place] [address] [place], [place] [address]    Report Status PENDING  Incomplete  Culture, blood (routine x 2)     Status: None (Preliminary result)   Collection Time: 09/08/2017  9:10 AM  Result Value Ref Range Status   Specimen Description BLOOD RIGHT ANTECUBITAL  Final   Special Requests   Final    BOTTLES DRAWN AEROBIC AND ANAEROBIC Blood Culture adequate volume   Culture   Final    NO GROWTH 3 DAYS Performed at [facility], [place] [address] [place],  [address]    Report Status PENDING  Incomplete    Coagulation Studies: No results for input(s): LABPROT, INR in the last 72 hours.  Urinalysis: Recent Labs    09/06/2017 1816  COLORURINE AMBER*  LABSPEC 1.021  PHURINE 5.0  GLUCOSEU 150*  HGBUR LARGE*  BILIRUBINUR NEGATIVE  KETONESUR 5*  PROTEINUR 100*  NITRITE NEGATIVE  LEUKOCYTESUR MODERATE*      Imaging: Dg Chest Port 1 View  Result Date: 09/30/2017 CLINICAL DATA:  Respiratory failure. EXAM: PORTABLE CHEST 1 VIEW COMPARISON:  Radiograph of September 29, 2017. FINDINGS: Stable cardiomegaly. Endotracheal and left internal jugular catheter are unchanged in position. Interval placement of feeding tube seen entering stomach. No pneumothorax is noted. Bibasilar subsegmental atelectasis is noted with small pleural effusions. Bony thorax is unremarkable. IMPRESSION: Interval placement of feeding tube. Otherwise stable support apparatus. Stable bibasilar opacities as described above. Electronically Signed   By: Marijo Conception, M.D.   On: 09/30/2017 09:29   Dg Chest Port 1 View  Result Date: 09/29/2017 CLINICAL DATA:  Acute respiratory failure. EXAM: PORTABLE CHEST 1 VIEW COMPARISON:  Radiograph of September 28, 2017. FINDINGS: Stable cardiomegaly. Endotracheal tube is in grossly good position. Left internal jugular catheter is noted with distal tip near expected  position of confluence of left brachiocephalic vein and SVC. No pneumothorax is noted. Bibasilar atelectasis or edema is noted with bilateral pleural effusions.  Bony thorax is unremarkable. IMPRESSION: Stable support apparatus. Stable bibasilar opacities as described above. Electronically Signed   By: Marijo Conception, M.D.   On: 09/29/2017 11:06   Dg Abd Portable 1v  Result Date: 09/29/2017 CLINICAL DATA:  NG tube placement EXAM: PORTABLE ABDOMEN - 1 VIEW COMPARISON:  None. FINDINGS: Feeding tube projects over the proximal stomach. Cardiomegaly. Bilateral lower lobe airspace opacities and effusions. Gas within mildly prominent bowel loops, likely colon. IMPRESSION: Feeding tube tip projects over the proximal stomach. Electronically Signed   By: Rolm Baptise M.D.   On: 09/29/2017 18:10     Medications:   . sodium chloride 50 mL/hr at 09/30/17 1000  . sodium chloride    . amiodarone 30 mg/hr (09/30/17 1000)  . famotidine (PEPCID) IV Stopped (09/29/17 2210)  . levofloxacin (LEVAQUIN) IV Stopped (09/29/17 1244)  . norepinephrine (LEVOPHED) Adult infusion 35 mcg/min (09/30/17 1000)  . phenylephrine (NEO-SYNEPHRINE) Adult infusion 240 mcg/min (09/30/17 1000)  . dialysis replacement fluid (prismasate) 700 mL/hr at 09/30/17 0404  . dialysis replacement fluid (prismasate) 800 mL/hr at 09/30/17 0658  . dialysate (PRISMASATE) 2,000 mL/hr at 09/30/17 1039  . sodium chloride    . vasopressin (PITRESSIN) infusion - *FOR SHOCK* 0.03 Units/min (09/30/17 1000)   . atorvastatin  20 mg Oral Daily  . B-complex with vitamin C  1 tablet Per Tube Daily  . chlorhexidine gluconate (MEDLINE KIT)  15 mL Mouth Rinse BID  . feeding supplement (VITAL HIGH PROTEIN)  1,000 mL Per Tube Q24H  . hydrocortisone sod succinate (SOLU-CORTEF) inj  50 mg Intravenous Q6H  . insulin aspart  0-9 Units Subcutaneous Q4H  . levothyroxine  37.5 mcg Intravenous Daily  . mouth rinse  15 mL Mouth Rinse 10 times per day    Place/Maintain arterial line **AND** sodium chloride, acetaminophen **OR** acetaminophen, dextrose, fentaNYL (SUBLIMAZE) injection, midazolam, ondansetron (ZOFRAN) IV, sodium chloride  Assessment/ Plan:   1. Acute renal failure in setting of shock - septic versus cardiogenic with severe aortic regurgitation  Consultation with cardiology. She continues on CRRT at this time 2D echo estimated ejection fraction 30 to 35% severe aortic regurgitation with increased regurgitation pressure half-time.  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  Continues on CRRT at this time with no changes to prescription.  No anticoagulation through CRRT.  Questioning HIT and therefore no heparin 2. Hypotension  MAP 90  Levophed  ,Neo-Synephrine    3. PAF  On heparin and amiodarone 4. Aortic regurgitation  Severe awaiting cardiology input 5.  Hyperkalemia resolved with CRRT 6. Metabolic acidosis resolved with CRRT 7. Hypothyroidism   Last TSH  8.27 8. Ventilator dependent respiratory failure   9. Empiric levoquin  Questioning UTI as cause  10.  Thrombocytopenia.  Questioning HIT versus DIC.  All heparin products discontinued at this time    LOS: [place] _0 _1 :47 AM

## 2017-09-30 NOTE — Progress Notes (Signed)
CRITICAL VALUE ALERT  Critical Value:  aptt >200  Date & Time Notied: 9/26 0525  Provider Notified: Pola Corn MD  Orders Received/Actions taken: no new orders

## 2017-09-30 NOTE — Progress Notes (Signed)
Upon morning assessment, PLT noted to be 54, down from 200s a few days ago; APTT 197.  Pharmacy made aware. HIT panel ordered.  Heparin through CRRT stopped per pharmacy.    Per Dr. Hyman Hopes, if CRRT clots restart w/ bival.

## 2017-09-30 NOTE — Progress Notes (Signed)
NAME:  [name], MRN:  [id number], DOB:  1955/06/16, LOS: 3 ADMISSION DATE:  10-07-17, CONSULTATION DATE:  October 07, 2017 REFERRING MD:  Dr. Rodena Medin, ER, CHIEF COMPLAINT:  Short of breath   Brief History   62 yo female with A fib and RVR, hypotension, acute renal failure, hypoxia, and lactic acidosis.  She was in hospital in September 2019 with A fib with RVR and severe aortic insufficiency.  She had cardioversion on 09/15/17, and then had bradycardia.  Her amiodarone dose was reduced.  She is followed by Dr. Vassie Loll for OSA on Bipap. Admitted with AMs, hypotension & AKI, presumed sepsis with lactic acidosis  Past Medical History Turner's syndrome, VSD, SVT, Aortic insufficiency, A fib, OSA, OA, Hypothyroidism, Hypertension, Difficult intubation, Chronic diastolic CHF, CAD  Significant Hospital Events   9/23 Admit 09/29/2017 change to limited CODE BLUE no CPR no shock 09/30/2017 is improved  Consults: date of consult/date signed off & final recs:  Cardiology 9/23 a fib with RVR >> 09/28/2017 nephrology with CVVH instituted>>  Procedures (surgical and bedside):  [facility] HD cath 9/23 >> ETT 9/23>> Lt radial a line 9/23 >> 09/29/2017 09/29/2017 right femoral A-line>> 09/29/2017 right femoral CVL>>  Significant Diagnostic Tests:  RHC/LHC 07/20/17 >> EF 50%, severe aortic regurgitation, non obstructive CAD, normal PA pressures, no shunt 09/29/1998 nineteen 2D echo with decreased EF and physiology consistent with Takotsubo cardiomyopathy Micro Data: Blood 9/23 >> Urine 9/23 >>  Antimicrobials:  Levaquin 9/23 >>    Subjective:  Pressors have been weaned down She is improved from yesterday  Objective   Blood pressure (!) 127/46, pulse 85, temperature (!) 96.4 F (35.8 C), resp. rate 17, height 4\' 8"  (1.422 m), weight 62.5 kg, SpO2 94 %.    Vent Mode: PRVC FiO2 (%):  [60 %-100 %] 60 % Set Rate:  [22 bmp] 22 bmp Vt Set:  [350 mL] 350 mL PEEP:  [5 cmH20] 5 cmH20 Plateau Pressure:  [9  cmH20-15 cmH20] 9 cmH20   Intake/Output Summary (Last 24 hours) at 09/30/2017 1131 Last data filed at 09/30/2017 1100 Gross per 24 hour  Intake 5982.76 ml  Output 5834 ml  Net 148.76 ml   Filed Weights   09/28/17 0500 09/29/17 0500 09/30/17 0340  Weight: 62.2 kg 62.8 kg 62.5 kg    Examination:  General: Frail elderly female sedated on vent HEENT: Endotracheal tube in place orogastric tube in place Neuro: She does open her eyes and follows some simple commands CV: Heart sounds are currently irregular with a rate of 89 systolic blood pressure of 127 PULM: even/non-labored, lungs bilaterally diminished WU:JWJX, non-tender, bsx4 active  Extremities: warm/dry, positive edema  Skin: no rashes or lesions     Assessment & Plan:   Shock - septic vs cardiogenic Severe Aortic insufficiency - seen by [facility] 08/2017, felt not severe enough for AVR Acute on chronic diastolic CHF. -Currently on Levophed at 58 mics which is much improved from 145 mics on 09/30/2007 -Weaning pressors at this time -Slowly improving -Continue antimicrobial therapy -Wean pressors as tolerated  A fib with RVR. -On amiodarone -Heparin DC'd due to bleeding from central line insertion sites in the groin  Acute hypoxic respiratory failure. Obstructive sleep apnea on home bipap -Vent settings have been reviewed and adjusted as needed -Currently on FiO2 of 60% with a PO2 of 72 -Continues to improve  AKI >> Lab Results  Component Value Date   CREATININE 0.55 09/30/2017   CREATININE 0.50 09/29/2017   CREATININE 0.56 09/29/2017  CREATININE 0.85 09/02/2015   CREATININE 0.80 03/01/2015   CREATININE 0.81 10/19/2014   -Remains on CVVH -Appreciate nephrology's input. -Monitor electrolytes  Lactic acidosis. -Continue to monitor lactic acid -Off of bicarb drip  ?UTI  - no clear source of infection, but could have UTI Empiric antibiotic Urine culture was not obtained since she is anuric  Protein calorie  malnutrition  -Trickle tube feeds at 20   Disposition / Summary of Today's Plan 09/30/17   Remains a limited CODE BLUE Pressors are weaning FiO2 is down to 6% with a PO2 of 72 She arouses to voice and follows commands at time Still remains on Levophed at 32 mics vasopressin at 0.3 Overall status is improved      Code Status: 09/29/2017 after discussion with the mother Allice Garro at 0 830 changed to a limited CODE BLUE.  We will continue with vasopressors and mechanical ventilatory support we will not shock her or do cardiopulmonary resuscitation.  I again explained to her the situation extremely grim prognosis is poor. Family Communication: 09/30/2017 mother updated at bedside.  Labs   CBC: Recent Labs  Lab 09/24/2017 0810 09/14/2017 0819 09/28/17 0315 09/29/17 0400 09/29/17 1519 09/30/17 0400  WBC 19.0*  --  25.3* 24.1*  --  21.2*  NEUTROABS 14.8*  --   --   --   --  19.8*  HGB 15.9* 17.7* 14.5 14.1 15.3* 12.1  HCT 52.0* 52.0* 44.8 45.4 45.0 39.2  MCV 106.8*  --  101.4* 105.1*  --  104.5*  PLT 325  --  204 123*  --  54*   Basic Metabolic Panel: Recent Labs  Lab 09/13/2017 0810  09/28/17 0315 09/28/17 2027 09/29/17 0400 09/29/17 0420 09/29/17 1517 09/29/17 1519 09/30/17 0400  NA 132*   < > 135 135  --  134* 136 135 136  K 5.9*   < > 3.7 5.2*  --  4.0 5.2* 4.1 4.3  CL 93*   < > 100 103  --  101 103 100 106  CO2 17*   < > 21* 21*  --  18* 21*  --  21*  GLUCOSE 194*   < > 183* 77  --  144* 133* 145* 115*  BUN 27*   < > 21 6*  --  <5* <5* <3* <5*  CREATININE 1.98*   < > 1.57* 0.91  --  0.78 0.56 0.50 0.55  CALCIUM 9.3   < > 7.3* 7.3*  --  6.8* 7.0*  --  7.1*  MG 2.6*  --  2.5*  --  2.2  --   --   --  2.1  PHOS 7.5*  --  4.9* 2.5  --  2.7 1.8*  --  1.8*   < > = values in this interval not displayed.   GFR: Estimated Creatinine Clearance: 53.9 mL/min (by C-G formula based on SCr of 0.55 mg/dL). Recent Labs  Lab 09/24/2017 0810  09/09/2017 0950 09/08/2017 1240  10/04/2017 1544 09/28/17 0315 09/28/17 1311 09/29/17 0400 09/29/17 1003 09/30/17 0400  PROCALCITON  --   --   --   --  1.04  --   --   --   --   --   WBC 19.0*  --   --   --   --  25.3*  --  24.1*  --  21.2*  LATICACIDVEN  --    < > 10.61* 10.5*  --   --  3.0*  --  5.8*  --    < > =  values in this interval not displayed.   Liver Function Tests: Recent Labs  Lab 09/06/2017 0810 09/28/17 0315 09/28/17 2027 09/29/17 0420 09/29/17 1517 09/30/17 0400  AST 216*  --   --   --   --   --   ALT 223*  --   --   --   --   --   ALKPHOS 105  --   --   --   --   --   BILITOT 3.5*  --   --   --   --   --   PROT 5.8*  --   --   --   --   --   ALBUMIN 3.4* 2.8* 2.7* 2.6* 2.7* 3.0*   No results for input(s): LIPASE, AMYLASE in the last 168 hours. No results for input(s): AMMONIA in the last 168 hours. ABG    Component Value Date/Time   PHART 7.311 (L) 09/29/2017 0510   PCO2ART 39.8 09/29/2017 0510   PO2ART 192 (H) 09/29/2017 0510   HCO3 19.5 (L) 09/29/2017 0510   TCO2 25 09/29/2017 1519   ACIDBASEDEF 5.6 (H) 09/29/2017 0510   O2SAT 99.2 09/29/2017 0510    Coagulation Profile: No results for input(s): INR, PROTIME in the last 168 hours. Cardiac Enzymes: Recent Labs  Lab 09/17/2017 1821  CKTOTAL 257*   HbA1C: Hgb A1c MFr Bld  Date/Time Value Ref Range Status  08/28/2014 03:05 PM 5.7 (H) 4.8 - 5.6 % Final    Comment:    (NOTE)         Pre-diabetes: 5.7 - 6.4         Diabetes: >6.4         Glycemic control for adults with diabetes: <7.0    CBG: Recent Labs  Lab 09/29/17 1517 09/29/17 1940 09/30/17 0001 09/30/17 0356 09/30/17 0749  GLUCAP 141* 123* 112* 119* 121*     App cct 45 min  Brett Canales Christyann Manolis ACNP [facility] Pager [phone] till 1 pm If no answer page [phone] 09/30/2017, 11:31 AM

## 2017-09-30 NOTE — Progress Notes (Signed)
Progress Note  Patient Name: [name] Date of Encounter: 09/30/2017  Primary Cardiologist:   Mertie Moores, MD   Subjective   Intubated.  Shakes head and answers yes no questions.  Denies pain.    Inpatient Medications    Scheduled Meds: . atorvastatin  20 mg Oral Daily  . B-complex with vitamin C  1 tablet Per Tube Daily  . chlorhexidine gluconate (MEDLINE KIT)  15 mL Mouth Rinse BID  . feeding supplement (VITAL HIGH PROTEIN)  1,000 mL Per Tube Q24H  . hydrocortisone sod succinate (SOLU-CORTEF) inj  50 mg Intravenous Q6H  . insulin aspart  0-9 Units Subcutaneous Q4H  . levothyroxine  37.5 mcg Intravenous Daily  . mouth rinse  15 mL Mouth Rinse 10 times per day   Continuous Infusions: . sodium chloride 50 mL/hr at 09/30/17 0800  . sodium chloride    . amiodarone 30 mg/hr (09/30/17 0800)  . famotidine (PEPCID) IV Stopped (09/29/17 2210)  . levofloxacin (LEVAQUIN) IV Stopped (09/29/17 1244)  . norepinephrine (LEVOPHED) Adult infusion 35 mcg/min (09/30/17 0800)  . phenylephrine (NEO-SYNEPHRINE) Adult infusion 250 mcg/min (09/30/17 0800)  . dialysis replacement fluid (prismasate) 700 mL/hr at 09/30/17 0404  . dialysis replacement fluid (prismasate) 800 mL/hr at 09/30/17 0658  . dialysate (PRISMASATE) 2,000 mL/hr at 09/30/17 0806  . sodium chloride    . vasopressin (PITRESSIN) infusion - *FOR SHOCK* 0.03 Units/min (09/30/17 0800)   PRN Meds: Place/Maintain arterial line **AND** sodium chloride, acetaminophen **OR** acetaminophen, dextrose, fentaNYL (SUBLIMAZE) injection, midazolam, ondansetron (ZOFRAN) IV, sodium chloride   Vital Signs    Vitals:   09/30/17 0600 09/30/17 0700 09/30/17 0800 09/30/17 0809  BP:  108/60 (!) 77/68 (!) 77/68  Pulse: 92 92 90 90  Resp: 18 (!) 22 17 (!) 31  Temp: (!) 97.5 F (36.4 C) (!) 97.3 F (36.3 C) (!) 97 F (36.1 C)   TempSrc:   (P) Bladder   SpO2: 98% 99% 96% 98%  Weight:      Height:        Intake/Output Summary (Last 24  hours) at 09/30/2017 0839 Last data filed at 09/30/2017 0800 Gross per 24 hour  Intake 6570.11 ml  Output 6498 ml  Net 72.11 ml   Filed Weights   09/28/17 0500 09/29/17 0500 09/30/17 0340  Weight: 62.2 kg 62.8 kg 62.5 kg    Telemetry    NSR, intermittent atrial flutter - Personally Reviewed  ECG    NA - Personally Reviewed  Physical Exam   GEN:    Critically ill appearing Neck:   Unable to assess JVD Cardiac: Irregular RR, systolic and diastolic murmurs, rubs, or gallops.  Respiratory:    Decreased breath sounds with dependent crackles.  GI: Soft, nontender, non-distended, normal bowel sounds  MS:  Mild diffuse edema; No deformity. Neuro:   Nonfocal .  Answers questions.   Labs    Chemistry Recent Labs  Lab 09/24/2017 0810  09/29/17 0420 09/29/17 1517 09/29/17 1519 09/30/17 0400  NA 132*   < > 134* 136 135 136  K 5.9*   < > 4.0 5.2* 4.1 4.3  CL 93*   < > 101 103 100 106  CO2 17*   < > 18* 21*  --  21*  GLUCOSE 194*   < > 144* 133* 145* 115*  BUN 27*   < > <5* <5* <3* <5*  CREATININE 1.98*   < > 0.78 0.56 0.50 0.55  CALCIUM 9.3   < > 6.8*  7.0*  --  7.1*  PROT 5.8*  --   --   --   --   --   ALBUMIN 3.4*   < > 2.6* 2.7*  --  3.0*  AST 216*  --   --   --   --   --   ALT 223*  --   --   --   --   --   ALKPHOS 105  --   --   --   --   --   BILITOT 3.5*  --   --   --   --   --   GFRNONAA 26*   < > >60 >60  --  >60  GFRAA 30*   < > >60 >60  --  >60  ANIONGAP 22*   < > 15 12  --  9   < > = values in this interval not displayed.     Hematology Recent Labs  Lab 09/28/17 0315 09/29/17 0400 09/29/17 1519 09/30/17 0400  WBC 25.3* 24.1*  --  21.2*  RBC 4.42 4.32  --  3.75*  HGB 14.5 14.1 15.3* 12.1  HCT 44.8 45.4 45.0 39.2  MCV 101.4* 105.1*  --  104.5*  MCH 32.8 32.6  --  32.3  MCHC 32.4 31.1  --  30.9  RDW 13.8 14.2  --  14.0  PLT 204 123*  --  54*    Cardiac EnzymesNo results for input(s): TROPONINI in the last 168 hours.  Recent Labs  Lab  10/01/2017 0818  TROPIPOC 2.16*     BNP Recent Labs  Lab 09/10/2017 0810  BNP >4,500.0*     DDimer No results for input(s): DDIMER in the last 168 hours.   Radiology    Dg Chest Port 1 View  Result Date: 09/29/2017 CLINICAL DATA:  Acute respiratory failure. EXAM: PORTABLE CHEST 1 VIEW COMPARISON:  Radiograph of September 28, 2017. FINDINGS: Stable cardiomegaly. Endotracheal tube is in grossly good position. Left internal jugular catheter is noted with distal tip near expected position of confluence of left brachiocephalic vein and SVC. No pneumothorax is noted. Bibasilar atelectasis or edema is noted with bilateral pleural effusions. Bony thorax is unremarkable. IMPRESSION: Stable support apparatus. Stable bibasilar opacities as described above. Electronically Signed   By: Marijo Conception, M.D.   On: 09/29/2017 11:06   Dg Abd Portable 1v  Result Date: 09/29/2017 CLINICAL DATA:  NG tube placement EXAM: PORTABLE ABDOMEN - 1 VIEW COMPARISON:  None. FINDINGS: Feeding tube projects over the proximal stomach. Cardiomegaly. Bilateral lower lobe airspace opacities and effusions. Gas within mildly prominent bowel loops, likely colon. IMPRESSION: Feeding tube tip projects over the proximal stomach. Electronically Signed   By: Rolm Baptise M.D.   On: 09/29/2017 18:10    Cardiac Studies   ECHO:   Study Conclusions  - Left ventricle: The cavity size was moderately dilated. There was   moderate focal basal hypertrophy. Systolic function was   moderately to severely reduced. The estimated ejection fraction   was in the range of 30% to 35%. Severe diffuse hypokinesis with   distinct regional wall motion abnormalities. There is akinesis of   the apical myocardium. The study was not technically sufficient   to allow evaluation of LV diastolic dysfunction due to atrial   fibrillation. - Aortic valve: There was severe regurgitation. Valve area (VTI):   1.55 cm^2. Valve area (Vmax): 1.44 cm^2. Valve  area (Vmean): 1.33   cm^2. Regurgitation pressure half-time: 249  ms. - Mitral valve: Calcified annulus. There was mild regurgitation.   Valve area by continuity equation (using LVOT flow): 1.15 cm^2. - Left atrium: The atrium was moderately dilated. - Tricuspid valve: There was trivial regurgitation. - Pulmonic valve: There was trivial regurgitation. - Pulmonary arteries: Systolic pressure could not be accurately   estimated.  Patient Profile     62 y.o. female with a hx of mod-severe AR/AI, D-CHF, OSA on BiPAP, Turner's syndrome, persistent A fib,  who is being seen for the evaluation of WCT at the request of Dr Halford Chessman.  She has had refractory shock with maximal therapy.  Has required intubation.  Assessment & Plan    AKI:  Now on CRRT.  [place] of kidneys OK.  Plans per nephrology.  Of note, discussed with Dr. Justin Mend.  Would restart CRRT if clotted.    VT:  EP has seen.  Question of VT vs SVT.  Initial 12 lead meets criteria for VT.  Currently with intermittent atrial flutter but currently NSR. See below.  No recurrent VT.  Continue IV amiodarone.   RESPIRATORY FAILURE:      CARDIOMYOPATHY:  EF was reduced on this echo with possible apical ballooning. Severe AI as was previously known.  Continue supportive care.  ATRIAL FLUTTER:  Intermittent with rate controlled .  Continue IV amiodarone.  She will need long term anticoagulation.  Awaiting HIT panel.  Off heparin pending this.  APTT greater than 200. ACT 202.   Auto anticoagulated for now.  Make decisions about further anticoagulation tomorrow.  OK to be off for now.    For questions or updates, please contact [facility] Please consult [email or web address] for contact info under Cardiology/STEMI.   Signed, Minus Breeding, MD  09/30/2017, 8:39 AM

## 2017-10-01 ENCOUNTER — Inpatient Hospital Stay (HOSPITAL_COMMUNITY): Payer: Medicaid Other

## 2017-10-01 DIAGNOSIS — L8990 Pressure ulcer of unspecified site, unspecified stage: Secondary | ICD-10-CM

## 2017-10-01 DIAGNOSIS — N170 Acute kidney failure with tubular necrosis: Secondary | ICD-10-CM

## 2017-10-01 LAB — POCT I-STAT 3, ART BLOOD GAS (G3+)
Acid-base deficit: 2 mmol/L (ref 0.0–2.0)
BICARBONATE: 25.6 mmol/L (ref 20.0–28.0)
Bicarbonate: 25.2 mmol/L (ref 20.0–28.0)
O2 SAT: 89 %
O2 Saturation: 87 %
PCO2 ART: 47.4 mmHg (ref 32.0–48.0)
PH ART: 7.378 (ref 7.350–7.450)
PH ART: 7.329 — AB (ref 7.350–7.450)
PO2 ART: 50 mmHg — AB (ref 83.0–108.0)
Patient temperature: 36.1
TCO2: 27 mmol/L (ref 22–32)
TCO2: 27 mmol/L (ref 22–32)
pCO2 arterial: 42.8 mmHg (ref 32.0–48.0)
pO2, Arterial: 58 mmHg — ABNORMAL LOW (ref 83.0–108.0)

## 2017-10-01 LAB — HEPATIC FUNCTION PANEL
ALBUMIN: 2.5 g/dL — AB (ref 3.5–5.0)
ALK PHOS: 142 U/L — AB (ref 38–126)
ALT: 3216 U/L — ABNORMAL HIGH (ref 0–44)
AST: 1086 U/L — ABNORMAL HIGH (ref 15–41)
BILIRUBIN DIRECT: 7 mg/dL — AB (ref 0.0–0.2)
BILIRUBIN INDIRECT: 6.9 mg/dL — AB (ref 0.3–0.9)
BILIRUBIN TOTAL: 13.9 mg/dL — AB (ref 0.3–1.2)
Total Protein: 3.8 g/dL — ABNORMAL LOW (ref 6.5–8.1)

## 2017-10-01 LAB — CBC WITH DIFFERENTIAL/PLATELET
BAND NEUTROPHILS: 15 %
BASOS PCT: 0 %
BLASTS: 0 %
Basophils Absolute: 0 10*3/uL (ref 0.0–0.1)
EOS ABS: 0 10*3/uL (ref 0.0–0.7)
Eosinophils Relative: 0 %
HCT: 40.3 % (ref 36.0–46.0)
HEMOGLOBIN: 12.7 g/dL (ref 12.0–15.0)
LYMPHS PCT: 2 %
Lymphs Abs: 0.4 10*3/uL — ABNORMAL LOW (ref 0.7–4.0)
MCH: 33 pg (ref 26.0–34.0)
MCHC: 31.5 g/dL (ref 30.0–36.0)
MCV: 104.7 fL — ABNORMAL HIGH (ref 78.0–100.0)
MONO ABS: 0.6 10*3/uL (ref 0.1–1.0)
MYELOCYTES: 0 %
Metamyelocytes Relative: 0 %
Monocytes Relative: 3 %
NEUTROS PCT: 80 %
Neutro Abs: 18.9 10*3/uL — ABNORMAL HIGH (ref 1.7–7.7)
OTHER: 0 %
PLATELETS: 25 10*3/uL — AB (ref 150–400)
PROMYELOCYTES RELATIVE: 0 %
RBC: 3.85 MIL/uL — ABNORMAL LOW (ref 3.87–5.11)
RDW: 14.8 % (ref 11.5–15.5)
SMEAR REVIEW: DECREASED
WBC: 19.9 10*3/uL — ABNORMAL HIGH (ref 4.0–10.5)
nRBC: 0 /100 WBC

## 2017-10-01 LAB — RENAL FUNCTION PANEL
ALBUMIN: 2.7 g/dL — AB (ref 3.5–5.0)
ALBUMIN: 2.5 g/dL — AB (ref 3.5–5.0)
Anion gap: 9 (ref 5–15)
Anion gap: 6 (ref 5–15)
CALCIUM: 7.1 mg/dL — AB (ref 8.9–10.3)
CALCIUM: 6.8 mg/dL — AB (ref 8.9–10.3)
CHLORIDE: 105 mmol/L (ref 98–111)
CHLORIDE: 105 mmol/L (ref 98–111)
CO2: 23 mmol/L (ref 22–32)
CO2: 24 mmol/L (ref 22–32)
CREATININE: 0.49 mg/dL (ref 0.44–1.00)
Creatinine, Ser: 0.47 mg/dL (ref 0.44–1.00)
GFR calc non Af Amer: >60 mL/min (ref >60)
Glucose, Bld: 154 mg/dL — ABNORMAL HIGH (ref 70–99)
Glucose, Bld: 148 mg/dL — ABNORMAL HIGH (ref 70–99)
PHOSPHORUS: 2.3 mg/dL — AB (ref 2.5–4.6)
PHOSPHORUS: 1.8 mg/dL — AB (ref 2.5–4.6)
Potassium: 4.2 mmol/L (ref 3.5–5.1)
Potassium: 4.4 mmol/L (ref 3.5–5.1)
SODIUM: 137 mmol/L (ref 135–145)
SODIUM: 135 mmol/L (ref 135–145)

## 2017-10-01 LAB — GLUCOSE, CAPILLARY
GLUCOSE-CAPILLARY: 122 mg/dL — AB (ref 70–99)
GLUCOSE-CAPILLARY: 119 mg/dL — AB (ref 70–99)
Glucose-Capillary: 137 mg/dL — ABNORMAL HIGH (ref 70–99)
Glucose-Capillary: 134 mg/dL — ABNORMAL HIGH (ref 70–99)
Glucose-Capillary: 134 mg/dL — ABNORMAL HIGH (ref 70–99)

## 2017-10-01 LAB — MAGNESIUM: MAGNESIUM: 2.4 mg/dL (ref 1.7–2.4)

## 2017-10-01 LAB — HEPARIN LEVEL (UNFRACTIONATED): HEPARIN UNFRACTIONATED: 0.29 [IU]/mL — AB (ref 0.30–0.70)

## 2017-10-01 LAB — COOXEMETRY PANEL
Carboxyhemoglobin: 1.4 % (ref 0.5–1.5)
METHEMOGLOBIN: 1.4 % (ref 0.0–1.5)
O2 Saturation: 62.8 %
Total hemoglobin: 12.6 g/dL (ref 12.0–16.0)

## 2017-10-01 LAB — DIC (DISSEMINATED INTRAVASCULAR COAGULATION)PANEL
INR: 3.28
Platelets: 25 10*3/uL — CL (ref 150–400)
Smear Review: NONE SEEN

## 2017-10-01 LAB — CBC
HCT: 40.2 % (ref 36.0–46.0)
Hemoglobin: 12.6 g/dL (ref 12.0–15.0)
MCH: 32.8 pg (ref 26.0–34.0)
MCHC: 31.3 g/dL (ref 30.0–36.0)
MCV: 104.7 fL — AB (ref 78.0–100.0)
PLATELETS: 25 10*3/uL — AB (ref 150–400)
RBC: 3.84 MIL/uL — AB (ref 3.87–5.11)
RDW: 14.6 % (ref 11.5–15.5)
WBC: 19.9 10*3/uL — ABNORMAL HIGH (ref 4.0–10.5)

## 2017-10-01 LAB — DIC (DISSEMINATED INTRAVASCULAR COAGULATION) PANEL
APTT: 40 s — AB (ref 24–36)
FIBRINOGEN: 157 mg/dL — AB (ref 210–475)
PROTHROMBIN TIME: 33.1 s — AB (ref 11.4–15.2)

## 2017-10-01 LAB — APTT: APTT: 40 s — AB (ref 24–36)

## 2017-10-01 MED ORDER — SODIUM CHLORIDE 0.9% FLUSH
3.0000 mL | Freq: Two times a day (BID) | INTRAVENOUS | Status: DC
  Administered 2017-10-01 – 2017-10-02 (×2): 3 mL via INTRAVENOUS

## 2017-10-01 MED ORDER — SODIUM CHLORIDE 0.9 % IV SOLN: 250.0000 mL | INTRAVENOUS | Status: DC | PRN

## 2017-10-01 MED ORDER — SODIUM CHLORIDE 0.9% FLUSH
3.0000 mL | INTRAVENOUS | Status: DC | PRN
  Administered 2017-10-02: 3 mL via INTRAVENOUS
  Filled 2017-10-01: qty 3

## 2017-10-01 MED ORDER — ADULT MULTIVITAMIN LIQUID CH
15.0000 mL | Freq: Every day | ORAL | Status: DC
  Administered 2017-10-01 – 2017-10-02 (×2): 15 mL via ORAL
  Filled 2017-10-01 (×2): qty 15

## 2017-10-01 MED ORDER — PRO-STAT SUGAR FREE PO LIQD
60.0000 mL | Freq: Three times a day (TID) | ORAL | Status: DC
  Administered 2017-10-01 – 2017-10-02 (×3): 60 mL via ORAL
  Filled 2017-10-01 (×3): qty 60

## 2017-10-01 NOTE — Consult Note (Addendum)
Advanced Heart Failure Team Consult Note   Primary Physician: Bernerd Limbo, MD PCP-Cardiologist:  Mertie Moores, MD  Reason for Consultation: heart Failure   HPI:    [name] is seen today for evaluation of heart failure  at the request of Dr Percival Spanish.    [name] is a 62 year old with a history of Turner Syndrome, hyporthyroidism, VSD with AI, chronic A fib, OSA on Bipap, VT, and chronic diastolic heart failure.   Hospitalized 08/2017 with A fib RVR. Treated with diltiazem drip and transitioned to metoprolol. Had hyperthyroidism so levothyroxine was lowered.  Admitted 09/15/2017 for elective cardioversion.Post procedure she developed bradycardia and hypotension. Placed on amio 400 mg twice a day then underwent DC-CV 9/12//2019.  Hospitalized in 09/2017 with symptomatic bradycardia. Hypotensive and received IVF and phenylephrine. BB held and amio was cut back.   On 9/23 EMS called for increased of breath. EKG showed WCT with pulse. Given IV amiodarone. On arrival to the ED she was in NSR and on Bipap. Pertinent admission labs included lactic acid 10.38, K 5.9, BNP 3020, WBC 19. Nephrology consulted and she was started on CVVHD 9/23. UA with many bacteria so levaquin was started. Renal US was negative for hydronephrosis. Blood CX with NGTD. LFTS significantly increased over the last 3 days. Platelets have been going down from 325> 25. HIT panel pending.  DIC  - D dimer > 20 , fibrinogen 157, and peripheral smear with schistocytes.    Pressors have been weaning. Currently on vasopressin and norepi 18 mcg. Neo stopped this morning. Palliative consulted today.   Lactic Acid 10.6>3>5.8>4.8 AST 1086  ALT 3216  Echo 09/28/2017  Left ventricle: The cavity size was moderately dilated. There was   moderate focal basal hypertrophy. Systolic function was   moderately to severely reduced. The estimated ejection fraction   was in the range of 30% to 35%. Severe diffuse hypokinesis with  distinct regional wall motion abnormalities. There is akinesis of   the apical myocardium. The study was not technically sufficient   to allow evaluation of LV diastolic dysfunction due to atrial   fibrillation. - Aortic valve: There was severe regurgitation. Valve area (VTI):   1.55 cm^2. Valve area (Vmax): 1.44 cm^2. Valve area (Vmean): 1.33   cm^2. Regurgitation pressure half-time: 249 Donna. - Mitral valve: Calcified annulus. There was mild regurgitation.   Valve area by continuity equation (using LVOT flow): 1.15 cm^2. - Left atrium: The atrium was moderately dilated. - Tricuspid valve: There was trivial regurgitation. - Pulmonic valve: There was trivial regurgitation. - Pulmonary arteries: Systolic pressure could not be accurately   estimated. ? Takotsubo   Patient is encephalopathic and or intubated. Therefore history has been obtained from chart review.   General: Weight gain _0 ; Weight loss _1 ; Anorexia _2 ; Fatigue _3 ; Fever _4 ; Chills _5 ; Weakness _6   Cardiac: Chest pain/pressure _7 ; Resting SOB _8 ; Exertional SOB _9 ; Orthopnea _10 ; Pedal Edema _11 ; Palpitations _12 ; Syncope _13 ; Presyncope _14 ; Paroxysmal nocturnal dyspnea_15   Pulmonary: Cough _16 ; Wheezing_17 ; Hemoptysis_18 ; Sputum _19 ; Snoring _20   GI: Vomiting_21 ; Dysphagia_22 ; Melena_23 ; Hematochezia _24 ; Heartburn_25 ; Abdominal pain _26 ; Constipation _27 ; Diarrhea _28 ; BRBPR _29   GU: Hematuria_30 ; Dysuria _31 ; Nocturia_32   Vascular: Pain in legs with walking _33 ; Pain in feet with lying flat _34 ; Non-healing sores _35 ;  Stroke _0 ; TIA _1 ; Slurred speech _2 ;  Neuro: Headaches_3 ; Vertigo_4 ; Seizures_5 ; Paresthesias_6 ;Blurred vision _7 ; Diplopia _8 ; Vision changes _9   Ortho/Skin: Arthritis _10 ; Joint pain _11 ; Muscle pain _12 ; Joint swelling _13 ; Back Pain _14 ; Rash _15   Psych: Depression_16 ; Anxiety_17   Heme: Bleeding problems _18 ; Clotting disorders _19 ; Anemia _20   Endocrine: Diabetes _21 ; Thyroid  dysfunction_22   Home Medications Prior to Admission medications   Medication Sig Start Date End Date Taking? Authorizing Provider  acetaminophen (TYLENOL) 500 MG tablet Take 1,000 mg by mouth every 6 (six) hours as needed for moderate pain or headache.   Yes [provider]  amiodarone (PACERONE) 200 MG tablet Take 1 tablet (200 mg total) by mouth daily. 09/16/17  Yes Dunn, Dayna N, PA-C  atorvastatin (LIPITOR) 20 MG tablet Take 1 tablet (20 mg total) by mouth daily. 03/17/17 03/12/18 Yes Weaver, Scott T, PA-C  cholecalciferol (VITAMIN D) 1000 units tablet Take 1,000 Units by mouth daily.   Yes [provider]  ELIQUIS 5 MG TABS tablet TAKE 1 TABLET BY MOUTH TWICE DAILY Patient taking differently: Take 5 mg by mouth 2 (two) times daily.  07/23/17  Yes Nahser, Wonda Cheng, MD  famotidine (PEPCID) 40 MG tablet Take 40 mg by mouth 2 (two) times daily.   Yes [provider]  furosemide (LASIX) 40 MG tablet Take 1 tablet (40 mg total) by mouth 2 (two) times daily. 09/08/17 12/07/17 Yes Nahser, Wonda Cheng, MD  levothyroxine (SYNTHROID, LEVOTHROID) 75 MCG tablet Take 1 tablet (75 mcg total) by mouth daily before breakfast. 08/21/17  Yes Isabelle Course, MD  potassium chloride (K-DUR,KLOR-CON) 10 MEQ tablet Take 1 tablet (10 mEq total) by mouth 2 (two) times daily. 09/08/17  Yes Nahser, Wonda Cheng, MD  spironolactone (ALDACTONE) 25 MG tablet Take one half tablet (12.5 mg) daily Patient taking differently: Take 12.5 mg by mouth daily.  08/31/17  Yes Nahser, Wonda Cheng, MD  traMADol (ULTRAM) 50 MG tablet Take 50 mg by mouth every 6 (six) hours as needed for moderate pain.    Yes [provider]    Past Medical History: Past Medical History:  Diagnosis Date  . Adiposity 06/04/2011  . Adult hypothyroidism 06/04/2011  . Aortic insufficiency    a. severe by echo, TEE 2019.  Marland Kitchen Benign hypertensive heart disease without heart failure 10/16/2010  . Bradycardia    a. 09/2017 -> required  cessation of metoprolol, decrease in amiodarone.  Marland Kitchen CAD (coronary artery disease)    a.  [facility] 07/2017 showed severe AI, nonobstructive CAD (50-60% prox-mid Cx, 40% prox-mid LAD, 30% prox RCA), normal PA pressures, no L-R shunt, LVEDP 40mHg, EF 50%..  . Cervix abnormality 1993   MILD ATYPICAL ADENOMATOUS HYPERPLASIA OF CERVIX  . [place] diastolic [place]  . [place]   [place] - [facility] c/w hypercarbia. - PSS 08/29/12 Severe obstructive sleep apnea/hypopnea syndrome, with an AHI of 79 > see OSA - Placed on 24h 02 3lpm at d/c 09/04/14 - stopped 12/2014     . Dermatitis 05-26-12   all over  since 9'13  . Difficult intubation    will plan on spinal anesthesia"small mouth/airway"  . Dyspnea   . Edema of both legs    a.  right greater than left, due to Turner's syndrome.  . Gagging episode   . H/O total hip arthroplasty 06/04/2011  . Hyperglycemia 12/23/2011  . Hypertension   . Hypothyroidism   . LAD (lymphadenopathy), mediastinal 08/30/2014  . Obesity 09/16/2014  . Orbital floor (blow-out) closed fracture ([place]) 08/17/2011  . OSA (obstructive sleep apnea) 09/10/2012   NPSG 08/29/12:  Severe obstructive sleep apnea/hypopnea syndrome, with an AHI of 79  06/2016 Bipap 21/17  . Osteoarthritis 10/16/2010   Overview:  Dr. Alvan Dame (hips and knees)   . PAF (paroxysmal atrial fibrillation) ([place])   . Periorbital edema   . 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 Severe aortic insufficiency   . Speech disorder    occ. "stutter"  . Supraventricular tachycardia ([place]) 08/27/2014  . Turner syndrome 06/04/2011  . Turner's syndrome   . VSD (ventricular septal defect)    a. ? by TEE 06/2017, not seen on cardiac CT and no shunt by [facility].    Past Surgical History: Past Surgical History:  Procedure Laterality Date  . CARDIOVERSION  06/14/2017   Procedure:  CARDIOVERSION;  Surgeon: Acie Fredrickson Wonda Cheng, MD;  Location: [place];  Service: Cardiovascular;;  . CARDIOVERSION N/A 09/15/2017   Procedure: CARDIOVERSION;  Surgeon: Skeet Latch, MD;  Location: [place];  Service: Cardiovascular;  Laterality: N/A;  . [place]  . COLPOSCOPY    . DILATION AND CURETTAGE OF UTERUS  1993   CONE BIOPSY  . HERNIA REPAIR    . JOINT REPLACEMENT     hip replacement  . RIGHT/LEFT HEART CATH AND CORONARY ANGIOGRAPHY N/A 07/20/2017   Procedure: RIGHT/LEFT HEART CATH AND CORONARY ANGIOGRAPHY;  Surgeon: Belva Crome, MD;  Location: [place] CV LAB;  Service: Cardiovascular;  Laterality: N/A;  . TEE WITHOUT CARDIOVERSION N/A 06/14/2017   Procedure: TRANSESOPHAGEAL ECHOCARDIOGRAM (TEE);  Surgeon: Acie Fredrickson Wonda Cheng, MD;  Location: [facility] ENDOSCOPY;  Service: Cardiovascular;  Laterality: N/A;  . TOTAL HIP ARTHROPLASTY     LEFT  . TOTAL KNEE ARTHROPLASTY Right 05/31/2012   Procedure: RIGHT TOTAL KNEE ARTHROPLASTY;  Surgeon: Mauri Pole, MD;  Location: [facility];  Service: Orthopedics;  Laterality: Right;  . ULTRASOUND GUIDANCE FOR VASCULAR ACCESS  07/20/2017   Procedure: Ultrasound Guidance For Vascular Access;  Surgeon: Belva Crome, MD;  Location: [place] CV LAB;  Service: Cardiovascular;;  . US ECHOCARDIOGRAPHY  06/20/2009   EF 55-60%    Family History: Family History  Problem Relation Age of Onset  . Hypertension Mother   . Cancer Mother        SKIN CANCER  . Atrial fibrillation Mother   . Breast cancer Cousin        MATERNAL COUSIN  . Diabetes Maternal Uncle     Social History: Social History   Socioeconomic History  . Marital status: Single    Spouse name: Not on file  . Number of children: Not on file  . Years of education: Not on file  . Highest education level: Not on file  Occupational History  . Occupation: Disabled    Fish farm manager: NOT EMPLOYED  Social Needs  . Financial resource strain: Not on file  . Food  insecurity:    Worry: Not on file    Inability: Not on file  . Transportation needs:    Medical: Not on file    Non-medical: Not on file  Tobacco Use  . Smoking status: Never Smoker  . Smokeless tobacco: Never Used  Substance and Sexual Activity  .  Alcohol use: No  . Drug use: No  . Sexual activity: Never    Birth control/protection: Post-menopausal  Lifestyle  . Physical activity:    Days per week: Not on file    Minutes per session: Not on file  . Stress: Not on file  Relationships  . Social connections:    Talks on phone: Not on file    Gets together: Not on file    Attends religious service: Not on file    Active member of club or organization: Not on file    Attends meetings of clubs or organizations: Not on file    Relationship status: Not on file  Other Topics Concern  . Not on file  Social History Narrative   This with her mother, who cares for her.    Allergies:  Allergies  Allergen Reactions  . Heparin     Platelet fall on heparin w/ CRRT - HIT antibody pending   . Metoprolol     In 09/2017, developed severe bradycardia post cardioversion and required cessation of beta blocker. She is not allergic, but should be noted that she has h/o significant bradycardia.  . Cephalexin Rash and Other (See Comments)    Prefers not to have ? Psoriasis skin condition  . Tape Rash    At times develops light rash with some itching to adhesive areas of band-aids    Objective:    Vital Signs:   Temp:  [94.3 F (34.6 C)-99.9 F (37.7 C)] 97.2 F (36.2 C) (09/27 1400) Pulse Rate:  [55-113] 55 (09/27 1400) Resp:  [18-36] 27 (09/27 1400) BP: (91-152)/(32-98) 120/47 (09/27 1400) SpO2:  [82 %-97 %] 94 % (09/27 1400) FiO2 (%):  [60 %-100 %] 100 % (09/27 1116) Weight:  [63.9 kg] 63.9 kg (09/27 0600) Last BM Date: (pta)  Weight change: Filed Weights   09/29/17 0500 09/30/17 0340 10/01/17 0600  Weight: 62.8 kg 62.5 kg 63.9 kg    Intake/Output:   Intake/Output Summary  (Last 24 hours) at 10/01/2017 1450 Last data filed at 10/01/2017 1400 Gross per 24 hour  Intake 2912.7 ml  Output 3168 ml  Net -255.3 ml      Physical Exam    General:  Intubated/sedated   HEENT: ETT  Neck: supple. JVP difficult to assess.  . Carotids 2+ bilat; no bruits. No lymphadenopathy or thyromegaly appreciated. Cor: PMI nondisplaced. Irregular rate & rhythm. No rubs, gallops or murmurs. Lungs: clear Abdomen: soft, nontender, nondistended. No hepatosplenomegaly. No bruits or masses. Good bowel sounds. Extremities: no cyanosis, clubbing, rash, R and LLE 1+ edema Neuro: Intubated/sedated.   Telemetry   A  Fib 100s   EKG    N/A  Labs   Basic Metabolic Panel: Recent Labs  Lab 09/16/2017 0810  09/28/17 0315  09/29/17 0400 09/29/17 0420 09/29/17 1517 09/29/17 1519 09/30/17 0400 09/30/17 1538 10/01/17 0315  NA 132*   < > 135   < >  --  134* 136 135 136 137 137  K 5.9*   < > 3.7   < >  --  4.0 5.2* 4.1 4.3 4.1 4.2  CL 93*   < > 100   < >  --  101 103 100 106 105 105  CO2 17*   < > 21*   < >  --  18* 21*  --  21* 23 23  GLUCOSE 194*   < > 183*   < >  --  144* 133* 145* 115* 140* 154*  BUN  27*   < > 21   < >  --  <5* <5* <3* <5* <5* <5*  CREATININE 1.98*   < > 1.57*   < >  --  0.78 0.56 0.50 0.55 0.46 0.49  CALCIUM 9.3   < > 7.3*   < >  --  6.8* 7.0*  --  7.1* 7.3* 7.1*  MG 2.6*  --  2.5*  --  2.2  --   --   --  2.1  --  2.4  PHOS 7.5*  --  4.9*   < >  --  2.7 1.8*  --  1.8* 1.6* 2.3*   < > = values in this interval not displayed.    Liver Function Tests: Recent Labs  Lab 09/06/2017 0810  09/29/17 1517 09/30/17 0400 09/30/17 1538 10/01/17 0315 10/01/17 1041  AST 216*  --   --   --   --   --  1,086*  ALT 223*  --   --   --   --   --  3,216*  ALKPHOS 105  --   --   --   --   --  142*  BILITOT 3.5*  --   --   --   --   --  13.9*  PROT 5.8*  --   --   --   --   --  3.8*  ALBUMIN 3.4*   < > 2.7* 3.0* 2.7* 2.7* 2.5*   < > = values in this interval not displayed.     No results for input(s): LIPASE, AMYLASE in the last 168 hours. No results for input(s): AMMONIA in the last 168 hours.  CBC: Recent Labs  Lab 09/09/2017 0810  09/28/17 0315 09/29/17 0400 09/29/17 1519 09/30/17 0400 10/01/17 0315 10/01/17 0645  WBC 19.0*  --  25.3* 24.1*  --  21.2* 19.9* 19.9*  NEUTROABS 14.8*  --   --   --   --  19.8*  --  18.9*  HGB 15.9*   < > 14.5 14.1 15.3* 12.1 12.6 12.7  HCT 52.0*   < > 44.8 45.4 45.0 39.2 40.2 40.3  MCV 106.8*  --  101.4* 105.1*  --  104.5* 104.7* 104.7*  PLT 325  --  204 123*  --  54* 25* 25*  25*   < > = values in this interval not displayed.    Cardiac Enzymes: Recent Labs  Lab 09/22/2017 1821  CKTOTAL 257*    BNP: BNP (last 3 results) Recent Labs    08/16/17 1832 09/15/17 1724 09/30/2017 0810  BNP 1,720.4* 3,020.7* >4,500.0*    ProBNP (last 3 results) Recent Labs    09/10/17 1535  PROBNP 3,174.0*     CBG: Recent Labs  Lab 09/30/17 1937 09/30/17 2321 10/01/17 0317 10/01/17 0800 10/01/17 1204  GLUCAP 144* 147* 137* 122* 119*    Coagulation Studies: Recent Labs    10/01/17 0645  LABPROT 33.1*  INR 3.28     Imaging   Dg Chest Port 1 View  Result Date: 10/01/2017 CLINICAL DATA:  Check endotracheal tube placement EXAM: PORTABLE CHEST 1 VIEW COMPARISON:  09/30/2017 FINDINGS: Endotracheal tube, feeding catheter and left jugular central line are again noted and stable. The cardiac shadow remains enlarged. The lungs are well aerated but now demonstrate diffuse bilateral perihilar infiltrates likely related to edema given the acute nature of the change. No bony abnormality is noted. IMPRESSION: Changes most consistent with bilateral perihilar pulmonary edema. Electronically Signed  By: Inez Catalina M.D.   On: 10/01/2017 08:10      Medications:     Current Medications: . amiodarone  200 mg Oral Daily  . atorvastatin  20 mg Oral Daily  . B-complex with vitamin C  1 tablet Per Tube Daily  .  chlorhexidine gluconate (MEDLINE KIT)  15 mL Mouth Rinse BID  . Chlorhexidine Gluconate Cloth  6 each Topical Q0600  . feeding supplement (VITAL HIGH PROTEIN)  1,000 mL Per Tube Q24H  . hydrocortisone sod succinate (SOLU-CORTEF) inj  50 mg Intravenous Q6H  . insulin aspart  0-9 Units Subcutaneous Q4H  . levothyroxine  37.5 mcg Intravenous Daily  . mouth rinse  15 mL Mouth Rinse 10 times per day  . sodium chloride flush  10-40 mL Intracatheter Q12H  . sodium chloride flush  3 mL Intravenous Q12H     Infusions: . sodium chloride 50 mL/hr at 10/01/17 1400  . sodium chloride    . sodium chloride    . famotidine (PEPCID) IV Stopped (09/30/17 2230)  . levofloxacin (LEVAQUIN) IV Stopped (10/01/17 1304)  . norepinephrine (LEVOPHED) Adult infusion 20 mcg/min (10/01/17 1400)  . phenylephrine (NEO-SYNEPHRINE) Adult infusion Stopped (10/01/17 0452)  . dialysis replacement fluid (prismasate) 700 mL/hr at 10/01/17 0242  . dialysis replacement fluid (prismasate) 800 mL/hr at 10/01/17 0851  . dialysate (PRISMASATE) 2,000 mL/hr at 10/01/17 1325  . sodium chloride    . vasopressin (PITRESSIN) infusion - *FOR SHOCK* 0.03 Units/min (10/01/17 1400)       Patient Profile   [name] is a 62 year old with a history of turner syndrome, hyporthyroidism, VSD with AI, chronic A fib, OSA on Bipap, VT, and chronic diastolic heart failure.   Admitted with lactic acidosis, respiratory failure, and hypotension.   Assessment/Plan   1. Shock  - Cardiogenic versus Septic?  ECHO EF 30-35%. ? takotsubo Severe Aortic Regurgitation Started stress dose steroids.  On vaspressin 0.03 units On Norepi 20 mcg Blood CX NGTD UA - many bacteria.   2.Acute Renal Failure  Started CVVHD 9/23  3. Acute Hypoxic Respiratory Failure  Intubated 09/06/2017 FiO2 100%.   4. PAF In A fib. On Po amio 200 mg daily.  No heparin with possible HIT.  5. Aortic Regurgitation Noted on ECHO 9/24 Not a candidate for replacement  with shock.   6. Transaminase Elevated LFTs in the setting of shcok.   7. Thrombocytopenia Platelets 321-->25  8. Turner Syndrome.   Palliative Care following. CCM discussed code status today.   Length of Stay: 4  Amy Clegg, NP  10/01/2017, 2:50 PM  Advanced Heart Failure Team Pager [phone] (M-F; 7a - 4p)  Please contact [facility] for night-coverage after hours (4p -7a ) and weekends on [email or web address]  Agree with above.  62 y/o woman with Turner's syndrome and chronic AF. Admitted with profound shock likely due to a combination of HF and sepsis. Echo with EF 30-35% and severe AI. Course c/b AKI and and now on CVVHD. Also now has DIC with PLT count ~ 25k. No obvious bleeding. Remains on vent and high-dose pressors.   On exam Jaundiced on vent LIJ trialysis Cor IRR  Distant Lungs rhonchi Ab obese NT  Ext cool trace edema  She is critically ill with DIC and MSOF in setting of cardiogenic/septic shock. Long talk with family about poor prognosis and low likelihood of returning to her baseline even if she does survive. They want to continue with current care for now. Do  not want to escalate (No CPR or defib). Watch PLTs closely. If drop below 10k or has bleeding would transfuse.   CRITICAL CARE Performed by: Glori Bickers  Total critical care time: 35 minutes  Critical care time was exclusive of separately billable procedures and treating other patients.  Critical care was necessary to treat or prevent imminent or life-threatening deterioration.  Critical care was time spent personally by me (independent of midlevel providers or residents) on the following activities: development of treatment plan with patient and/or surrogate as well as nursing, discussions with consultants, evaluation of patient's response to treatment, examination of patient, obtaining history from patient or surrogate, ordering and performing treatments and interventions, ordering and review of  laboratory studies, ordering and review of radiographic studies, pulse oximetry and re-evaluation of patient's condition.  Glori Bickers, MD  4:52 PM

## 2017-10-01 NOTE — Progress Notes (Signed)
eLink Physician-Brief Progress Note Patient Name: [name] DOB: [birth year] MRN: [id number]   Date of Service  10/01/2017  HPI/Events of Note  Thrombocytopenia-PLT 25.000, pt not currently on Heparin. Heparin recently discontinued. No evidence of significant hemorrhage at this time.  eICU Interventions  HIT panel, DIC screen, no indication for platelet transfusion currently, monitor for signs of hemorrhage.        [name] 10/01/2017, 6:36 AM

## 2017-10-01 NOTE — Progress Notes (Signed)
Daily Progress Note   Patient Name: [name]       Date: 10/01/2017 DOB: [birth year]  Age: 62 y.o. MRN#: [id number] Attending Physician: Kipp Brood, MD Primary Care Physician: Bernerd Limbo, MD Admit Date: 09/26/2017  Reason for Consultation/Follow-up: Establishing goals of care  Subjective: Patient intubated. Opens eyes to voice. No s/s of distress.  GOC:  Follow-up with mother, [name], at bedside. Joanne's cousins also at bedside. Discussed diagnoses, current interventions, and poor prognosis. Explained high risk for bleeding with low platelets. [name] acknowledges her conversation with Dr. Lynetta Mare this morning and does understand how sick [name] is. [name] states "she won't be coming home." [name] again shares her deep connection with [name] throughout life and that she can't imagine life without her. [name] tells me is "still praying" that [name] will get better.    Cousin pulls me aside and asked how much time Marguerite has left. I did explain that there are many interventions that are keeping her alive and the concern that she will pass quickly when these interventions are discontinued. He tells me it will be very hard for [name] to make the decision to "pull the plug." Reassured of continued support from the care team with helping make this decision. Emotional/spiritual support provided.    Length of Stay: 4  Current Medications: Scheduled Meds:  . amiodarone  200 mg Oral Daily  . atorvastatin  20 mg Oral Daily  . B-complex with vitamin C  1 tablet Per Tube Daily  . chlorhexidine gluconate (MEDLINE KIT)  15 mL Mouth Rinse BID  . Chlorhexidine Gluconate Cloth  6 each Topical Q0600  . feeding supplement (VITAL HIGH PROTEIN)  1,000 mL Per Tube Q24H  . hydrocortisone sod succinate  (SOLU-CORTEF) inj  50 mg Intravenous Q6H  . insulin aspart  0-9 Units Subcutaneous Q4H  . levothyroxine  37.5 mcg Intravenous Daily  . mouth rinse  15 mL Mouth Rinse 10 times per day  . sodium chloride flush  10-40 mL Intracatheter Q12H  . sodium chloride flush  3 mL Intravenous Q12H    Continuous Infusions: . sodium chloride 50 mL/hr at 10/01/17 1200  . sodium chloride    . sodium chloride    . famotidine (PEPCID) IV Stopped (09/30/17 2230)  . levofloxacin (LEVAQUIN) IV 250 mg (10/01/17 1204)  . norepinephrine (  LEVOPHED) Adult infusion 28 mcg/min (10/01/17 1200)  . phenylephrine (NEO-SYNEPHRINE) Adult infusion Stopped (10/01/17 0452)  . dialysis replacement fluid (prismasate) 700 mL/hr at 10/01/17 0242  . dialysis replacement fluid (prismasate) 800 mL/hr at 10/01/17 0851  . dialysate (PRISMASATE) 2,000 mL/hr at 10/01/17 1019  . sodium chloride    . vasopressin (PITRESSIN) infusion - *FOR SHOCK* 0.03 Units/min (10/01/17 1200)    PRN Meds: Place/Maintain arterial line **AND** sodium chloride, sodium chloride, acetaminophen **OR** acetaminophen, dextrose, fentaNYL (SUBLIMAZE) injection, midazolam, ondansetron (ZOFRAN) IV, sodium chloride, sodium chloride flush, sodium chloride flush  Physical Exam  Constitutional: She appears ill. She is intubated.  HENT:  Head: Normocephalic and atraumatic.  Cardiovascular: Regular rhythm.  Pulmonary/Chest: No accessory muscle usage. No tachypnea. She is intubated. No respiratory distress.  Abdominal: There is no tenderness.  Neurological:  Will open eyes to voice.  Skin: Skin is warm and dry.  Psychiatric: She is inattentive.           Vital Signs: BP (!) 129/40   Pulse (!) 55   Temp 98.2 F (36.8 C)   Resp (!) 30   Ht 4' 8"  (1.422 m)   Wt 63.9 kg   SpO2 92%   BMI 31.58 kg/m  SpO2: SpO2: 92 % O2 Device: O2 Device: Ventilator O2 Flow Rate: O2 Flow Rate (L/min): 4 L/min  Intake/output summary:   Intake/Output Summary (Last 24  hours) at 10/01/2017 1254 Last data filed at 10/01/2017 1200 Gross per 24 hour  Intake 2925.31 ml  Output 3168 ml  Net -242.69 ml   LBM: Last BM Date: (pta) Baseline Weight: Weight: 59.9 kg Most recent weight: Weight: 63.9 kg       Palliative Assessment/Data: PPS 20%     Patient Active Problem List   Diagnosis Date Noted  . Palliative care by specialist   . Acute on chronic diastolic (congestive) heart failure ([place]) 09/16/2017  . Acute renal failure (ARF) ([place]) 10/01/2017  . Hyperkalemia 09/06/2017  . Shock circulatory ([place])   . Lactic acid acidosis   . Goals of care, counseling/discussion   . Bradycardia 09/15/2017  . Atrial fibrillation with RVR ([place]) 08/16/2017  . Moderate aortic insufficiency   . Ventricular septal defect   . PAF (paroxysmal atrial fibrillation) ([place]) 10/12/2014  . Chronic diastolic CHF (congestive heart failure) ([place]) 10/12/2014  . Gagging episode   . Obesity 09/16/2014  . Dyspnea   . Lung nodule 08/30/2014  . LAD (lymphadenopathy), mediastinal 08/30/2014  . Acute respiratory failure with hypoxia ([place]) 08/29/2014  . Supraventricular tachycardia ([place]) 08/27/2014  . Persistent atrial fibrillation ([place]) 08/27/2014  . OSA (obstructive sleep apnea) 09/10/2012  . Chronic respiratory failure with hypercapnia ([place]) 07/06/2012  . Obese 06/01/2012  . 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 Hyperglycemia 12/23/2011  . Orbital floor (blow-out) closed fracture ([place]) 08/17/2011  . H/O total hip arthroplasty 06/04/2011  . Adult hypothyroidism 06/04/2011  . Adiposity 06/04/2011  . Turner syndrome 06/04/2011  . Cervix abnormality   . 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 Aortic insufficiency 10/16/2010  . Osteoarthritis 10/16/2010    Palliative Care Assessment & Plan   Patient Profile: 62 y.o. female  with past medical history of Turners syndrome, VSD, SVT, aortic insufficiency, diastolic CHF, CAD, afib, OSA, OA, hypothyroidisim,  hypertension admitted on 09/20/2017 with shortness of breath. Patient was hospitalized in September 2019 with afib RVR and severe aortic insufficiency. Cardioversion on 09/15/17 and then bradycardia. RHC/LCH 07/2017 revealed nonobstructive CAD in the mid left  circumflex and severe aortic insufficiency. Evaluated by Dr. Cyndia Bent 08/09/17, no recommendation for surgery. 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 Palliative medicine consultation for goals of care.   Assessment: Shock-septic versus cardiogenic Severe aortic insufficiency Acute on chronic diastolic CHF Afib with RVR Acute hypoxic respiratory failure Acute kidney injury Lactic acidosis ? UTI  Recommendations/Plan:  Continue partial code status and current medical interventions. Mother, [name], understands diagnoses, interventions, and poor prognosis but continues to pray that [name] will show improvement. She is not ready to make decisions regarding comfort care.   PMT will continue to support patient/family.   Code Status: Partial    Code Status Orders  (From admission, onward)         Start     Ordered   09/29/17 0837  Limited resuscitation (code)  Continuous    Question Answer Comment  In the event of cardiac or respiratory ARREST: Initiate Code Blue, Call Rapid Response No   In the event of cardiac or respiratory ARREST: Perform CPR No   In the event of cardiac or respiratory ARREST: Perform Intubation/Mechanical Ventilation Yes   In the event of cardiac or respiratory ARREST: Use NIPPV/BiPAp only if indicated No   In the event of cardiac or respiratory ARREST: Administer ACLS medications if indicated Yes   In the event of cardiac or respiratory ARREST: Perform Defibrillation or Cardioversion if indicated No       09/29/17 0838        Code Status History    Date Active Date Inactive Code Status Order ID Comments User Context   09/15/2017 1003 09/29/2017 0838 Full Code [id number]  Chesley Mires, MD ED   09/15/2017 1704 09/16/2017 1747 Full Code [id number]  Jettie Booze, MD Inpatient   09/15/2017 1700 09/15/2017 1704 Full Code [id number]  Skeet Latch, MD Inpatient   08/16/2017 2222 08/20/2017 1915 Full Code [id number]  Kathi Ludwig, MD ED   07/20/2017 1007 07/20/2017 1602 Full Code [id number]  Belva Crome, MD Inpatient   10/12/2014 1524 10/16/2014 1537 Full Code [id number]  Melton Alar, PA-C Inpatient   08/27/2014 1955 09/04/2014 1925 Full Code [id number]  Etta Quill, DO ED   05/31/2012 1309 06/03/2012 1938 Full Code [id number]  Babish, Lucille Passy, PA-C Inpatient       Prognosis:   Unable to determine: Guarded to poor prognosis with septic vs cardiogenic shock, severe aortic insufficiency, acute on chronic CHF, afib RVR, acute respiratory failure intubated on ventilator, acute kidney injury requiring CRRT.   Discharge Planning:  To Be Determined  Care plan was discussed with RN, patient's family at bedside including mother and cousins  Thank you for allowing the Palliative Medicine Team to assist in the care of this patient.   Time In: 1210 Time Out: 1250 Total Time 20mn Prolonged Time Billed no      Greater than 50%  of this time was spent counseling and coordinating care related to the above assessment and plan.  MIhor Dow FNP-C Palliative Medicine Team  Phone: [phone]: [phone] Please contact Palliative Medicine Team phone at [phone] questions and concerns.

## 2017-10-01 NOTE — Progress Notes (Addendum)
CRITICAL VALUE ALERT  Critical Value:  Platelets 25  Date & Time Notied:  10/01/17 @ 0407  Provider Notified: eLink notified  Orders Received/Actions taken: No new orders received at this time.  ADDENDUM 0630: MD entered order set for HIT and DIC panel; labs to be drawn at this time.  Herma Ard, RN

## 2017-10-01 NOTE — Progress Notes (Signed)
Nutrition Follow-up  DOCUMENTATION CODES:   Not applicable  INTERVENTION:   Tube Feeding:  Continue Vital High Protein @ 20 ml/hr Add Pro-Stat 60 mL TID Provide 1080 kcals, 132 g of protein and 403 mL of free water  Continue B-complex with Vitamin C  Add Liquid MVI as TF does not meet RDIs   NUTRITION DIAGNOSIS:   Inadequate oral intake related to acute illness as evidenced by NPO status.  Being addressed via TF   GOAL:   Patient will meet greater than or equal to 90% of their needs  Met  MONITOR:   TF tolerance, Vent status, Labs, Weight trends  REASON FOR ASSESSMENT:   Ventilator    ASSESSMENT:   62 yo female admitted with shock-septic vs cardiogenic, acute on chronic CHF, AKI with persistent hyperkalemia and acidosis requiring CRRT. PMH includes Turner's syndrome, HTN  Patient is currently intubated on ventilator support MV: 10.2 L/min Temp (24hrs), Avg:96.6 F (35.9 C), Min:94.3 F (34.6 C), Max:99.9 F (37.7 C)  Remains on CRRT, hypothermia on warming blanket, remains on vasopressin and levophed Tolerating Vital High Protein @ 20 ml/hr via Cortrak, tip in stomach  Labs: elevated LFTs, phosphorus 2.3 (L), BUN <5, Creatinine wdl Meds: reviewed  Diet Order:   Diet Order            Diet NPO time specified  Diet effective now              EDUCATION NEEDS:   Not appropriate for education at this time  Skin:  Skin Assessment: Reviewed RN Assessment  Last BM:  no documented BM  Height:   Ht Readings from Last 1 Encounters:  10/01/17 4' 8"  (1.422 m)    Weight:   Wt Readings from Last 1 Encounters:  10/01/17 63.9 kg    Ideal Body Weight:     BMI:  Body mass index is 31.58 kg/m.  Estimated Nutritional Needs:   Kcal:  1089 kcals   Protein:  90-120 g   Fluid:  >/= 1.2 L   Kerman Passey MS, RD, LDN, CNSC [phone] Pager  [phone] Weekend/On-Call Pager

## 2017-10-01 NOTE — Progress Notes (Signed)
NAME:  [name], MRN:  [id number], DOB:  [birth year], LOS: 4 ADMISSION DATE:  10/08/2017, CONSULTATION DATE:  Oct 08, 2017 REFERRING MD:  Dr. Rodena Medin, ER, CHIEF COMPLAINT:  Short of breath   Brief History   62 yo female with A fib and RVR, hypotension, acute renal failure, hypoxia, and lactic acidosis.  She was in hospital in September 2019 with A fib with RVR and severe aortic insufficiency.  She had cardioversion on 09/15/17, and then had bradycardia.  Her amiodarone dose was reduced.  She is followed by Dr. Vassie Loll for OSA on Bipap. Admitted with AMs, hypotension & AKI, presumed sepsis with lactic acidosis  Past Medical History Turner's syndrome, VSD, SVT, Aortic insufficiency, A fib, OSA, OA, Hypothyroidism, Hypertension, Difficult intubation, Chronic diastolic CHF, CAD  Significant Hospital Events   9/23 Admit 09/29/2017 change to limited CODE BLUE no CPR no shock 09/30/2017 is improved  Consults: date of consult/date signed off & final recs:  Cardiology 9/23 a fib with RVR >> 09/28/2017 nephrology with CVVH instituted>>  Procedures (surgical and bedside):  [facility] HD cath 9/23 >> ETT 9/23>> Lt radial a line 9/23 >> 09/29/2017 09/29/2017 right femoral A-line>> 09/29/2017 right femoral CVL>>  Significant Diagnostic Tests:  RHC/LHC 07/20/17 >> EF 50%, severe aortic regurgitation, non obstructive CAD, normal PA pressures, no shunt 09/29/1998 nineteen 2D echo with decreased EF and physiology consistent with Takotsubo cardiomyopathy Micro Data: Blood 9/23 >> Urine 9/23 >>  Antimicrobials:  Levaquin 9/23 >>    Subjective:  Pressors have been weaned down with improved ScvO2 once phenylephrine stopped. Remains encephalopathic.  Objective   Blood pressure (!) 114/44, pulse (!) 106, temperature (!) 96.4 F (35.8 C), resp. rate (!) 24, height 4\' 8"  (1.422 m), weight 63.9 kg, SpO2 94 %.    Vent Mode: PRVC FiO2 (%):  [60 %-100 %] 100 % Set Rate:  [22 bmp] 22 bmp Vt Set:  [350 mL] 350  mL PEEP:  [5 cmH20] 5 cmH20 Plateau Pressure:  [9 cmH20-19 cmH20] 15 cmH20   Intake/Output Summary (Last 24 hours) at 10/01/2017 1531 Last data filed at 10/01/2017 1500 Gross per 24 hour  Intake 2962.33 ml  Output 3210 ml  Net -247.67 ml   Filed Weights   09/29/17 0500 09/30/17 0340 10/01/17 0600  Weight: 62.8 kg 62.5 kg 63.9 kg    Examination:  General: Frail elderly female sedated on vent HEENT: Endotracheal tube in place orogastric tube in place Neuro: She does open her eyes and follows some simple commands CV: Heart sounds are currently irregular with a rate of 89 systolic blood pressure of 127 PULM: even/non-labored, lungs bilaterally diminished [id number], non-tender, bsx4 active  Extremities: warm/dry, positive edema  Skin: no rashes or lesions   Assessment & Plan:   Shock -  cardiogenic Severe Aortic insufficiency - seen by [facility] 08/2017, felt not severe enough for AVR Acute on chronic diastolic CHF. On improving doses of pressors.  This is not changed to underlying pathology which is uncorrectable severe aortic regurgitation.  Cardiac and general medical status precludes surgical intervention. Continue one-way pressor wean.  A fib with RVR. -On amiodarone -Heparin DC'd due to bleeding from central line insertion sites in the groin  Acute hypoxic respiratory failure. Obstructive sleep apnea on home bipap -Vent settings have been reviewed and adjusted as needed -Currently on FiO2 of 60% with a PO2 of 72 -Continues to improve  AKI >> Lab Results  Component Value Date   CREATININE 0.49 10/01/2017   CREATININE 0.46 09/30/2017  CREATININE 0.55 09/30/2017   CREATININE 0.85 09/02/2015   CREATININE 0.80 03/01/2015   CREATININE 0.81 10/19/2014   -Remains on CVVH -Appreciate nephrology's input. -Monitor electrolytes  Lactic acidosis. -Continue to monitor lactic acid -Off of bicarb drip  ?UTI  - no clear source of infection, but could have UTI Empiric  antibiotic Urine culture was not obtained since she is anuric  Protein calorie malnutrition  -Trickle tube feeds at 20   Disposition / Summary of Today's Plan 10/01/17   Remains a limited CODE BLUE Pressors are weaning FiO2 is down to 6% with a PO2 of 72 She arouses to voice and follows commands at time Still remains on Levophed at 32 mics vasopressin at 0.3 Overall status is improved      Code Status: 09/29/2017 after discussion with the mother [name] at 0 830 changed to a limited CODE BLUE.  We will continue with vasopressors and mechanical ventilatory support we will not shock her or do cardiopulmonary resuscitation.  I again explained to her the situation extremely grim prognosis is poor. Indicated to her that there are no further therapeutic options and that ongoing aggressive care would be served to increase the patient's suffering and that a transition to comfort care should be considered.   family Communication: 9/272019 mother updated at bedside.  Labs   CBC: Recent Labs  Lab Oct 06, 2017 0810  09/28/17 0315 09/29/17 0400 09/29/17 1519 09/30/17 0400 10/01/17 0315 10/01/17 0645  WBC 19.0*  --  25.3* 24.1*  --  21.2* 19.9* 19.9*  NEUTROABS 14.8*  --   --   --   --  19.8*  --  18.9*  HGB 15.9*   < > 14.5 14.1 15.3* 12.1 12.6 12.7  HCT 52.0*   < > 44.8 45.4 45.0 39.2 40.2 40.3  MCV 106.8*  --  101.4* 105.1*  --  104.5* 104.7* 104.7*  PLT 325  --  204 123*  --  54* 25* 25*  25*   < > = values in this interval not displayed.   Basic Metabolic Panel: Recent Labs  Lab 06-Oct-2017 0810  09/28/17 0315  09/29/17 0400 09/29/17 0420 09/29/17 1517 09/29/17 1519 09/30/17 0400 09/30/17 1538 10/01/17 0315  NA 132*   < > 135   < >  --  134* 136 135 136 137 137  K 5.9*   < > 3.7   < >  --  4.0 5.2* 4.1 4.3 4.1 4.2  CL 93*   < > 100   < >  --  101 103 100 106 105 105  CO2 17*   < > 21*   < >  --  18* 21*  --  21* 23 23  GLUCOSE 194*   < > 183*   < >  --  144* 133* 145*  115* 140* 154*  BUN 27*   < > 21   < >  --  <5* <5* <3* <5* <5* <5*  CREATININE 1.98*   < > 1.57*   < >  --  0.78 0.56 0.50 0.55 0.46 0.49  CALCIUM 9.3   < > 7.3*   < >  --  6.8* 7.0*  --  7.1* 7.3* 7.1*  MG 2.6*  --  2.5*  --  2.2  --   --   --  2.1  --  2.4  PHOS 7.5*  --  4.9*   < >  --  2.7 1.8*  --  1.8* 1.6* 2.3*   < > =  values in this interval not displayed.   GFR: Estimated Creatinine Clearance: 54.4 mL/min (by C-G formula based on SCr of 0.49 mg/dL). Recent Labs  Lab Oct 08, 2017 1240 08-Oct-2017 1544  09/28/17 1311 09/29/17 0400 09/29/17 1003 09/30/17 0400 09/30/17 1142 10/01/17 0315 10/01/17 0645  PROCALCITON  --  1.04  --   --   --   --   --   --   --   --   WBC  --   --    < >  --  24.1*  --  21.2*  --  19.9* 19.9*  LATICACIDVEN 10.5*  --   --  3.0*  --  5.8*  --  4.8*  --   --    < > = values in this interval not displayed.   Liver Function Tests: Recent Labs  Lab 10-08-2017 0810  09/29/17 1517 09/30/17 0400 09/30/17 1538 10/01/17 0315 10/01/17 1041  AST 216*  --   --   --   --   --  1,086*  ALT 223*  --   --   --   --   --  3,216*  ALKPHOS 105  --   --   --   --   --  142*  BILITOT 3.5*  --   --   --   --   --  13.9*  PROT 5.8*  --   --   --   --   --  3.8*  ALBUMIN 3.4*   < > 2.7* 3.0* 2.7* 2.7* 2.5*   < > = values in this interval not displayed.   No results for input(s): LIPASE, AMYLASE in the last 168 hours. No results for input(s): AMMONIA in the last 168 hours. ABG    Component Value Date/Time   PHART 7.378 10/01/2017 0319   PCO2ART 42.8 10/01/2017 0319   PO2ART 50.0 (L) 10/01/2017 0319   HCO3 25.6 10/01/2017 0319   TCO2 27 10/01/2017 0319   ACIDBASEDEF 3.0 (H) 09/30/2017 1123   O2SAT 62.8 10/01/2017 0830    Coagulation Profile: Recent Labs  Lab 10/01/17 0645  INR 3.28   Cardiac Enzymes: Recent Labs  Lab Oct 08, 2017 1821  CKTOTAL 257*   HbA1C: Hgb A1c MFr Bld  Date/Time Value Ref Range Status  08/28/2014 03:05 PM 5.7 (H) 4.8 - 5.6 %  Final    Comment:    (NOTE)         Pre-diabetes: 5.7 - 6.4         Diabetes: >6.4         Glycemic control for adults with diabetes: <7.0    CBG: Recent Labs  Lab 09/30/17 1937 09/30/17 2321 10/01/17 0317 10/01/17 0800 10/01/17 1204  GLUCAP 144* 147* 137* 122* 119*     CRITICAL CARE Performed by: Lynnell Catalan   Total critical care time: 40 minutes  Critical care time was exclusive of separately billable procedures and treating other patients.  Critical care was necessary to treat or prevent imminent or life-threatening deterioration.  Critical care was time spent personally by me on the following activities: development of treatment plan with patient and/or surrogate as well as nursing, discussions with consultants, evaluation of patient's response to treatment, examination of patient, obtaining history from patient or surrogate, ordering and performing treatments and interventions, ordering and review of laboratory studies, ordering and review of radiographic studies, pulse oximetry and re-evaluation of patient's condition.   Lynnell Catalan, MD [facility] ICU Physician [facility] Critical Care  Pager: [phone]  Mobile: [phone] After hours: [phone].

## 2017-10-01 NOTE — Progress Notes (Signed)
[facility] ROUNDING NOTE   Subjective:   Acute renal failure with hypotension , lactic acidosis and history of atrial fibrillation with RVR  Cardioversion 09/15/17   Patient has Turners syndrome VSD  SVT and Aortic insufficiency and hypothyroidism She has been started on CRRT and is tolerating this well so far. RHC/LHC 07/20/17 >> EF 50%, severe aortic regurgitation, non obstructive CAD, normal PA pressures, no shunt   Patient is shaking her head to yes and no questions.  Continues phenylephrine, vasopressin and neoepinephrine although it appears the requirements for pressors are decreasing.  She is also on an amiodarone drip.  Anuric with no urine output.  MAP of 67 through A-line.  Blood pressure 140/47 pulse 107 temperature 97.2 FiO2 100% 92% O2 sats.  Labs sodium 137 potassium 4.2 CO2 23 BUN less than 5 creatinine 0.49 calcium 7.1 phosphorus 2.3 albumin 2.7 magnesium 2.4 white count 19.9 hemoglobin 12.7 platelets 25 DIC panel INR 3.28 PTT 40 fibrinogen 157 d-dimer greater than 20 HIT panel pending  Renal ultrasound no hydronephrosis.  Chest x-ray consistent with bilateral perihilar pulmonary edema   Objective:  Vital signs in last 24 hours:  Temp:  [94.3 F (34.6 C)-97.5 F (36.4 C)] 97.2 F (36.2 C) (09/27 0813) Pulse Rate:  [85-113] 113 (09/27 0813) Resp:  [15-32] 32 (09/27 0813) BP: (91-152)/(32-98) 144/47 (09/27 0813) SpO2:  [82 %-100 %] 92 % (09/27 0814) FiO2 (%):  [60 %-100 %] 100 % (09/27 0814) Weight:  [63.9 kg] 63.9 kg (09/27 0600)  Weight change: 1.4 kg Filed Weights   09/29/17 0500 09/30/17 0340 10/01/17 0600  Weight: 62.8 kg 62.5 kg 63.9 kg    Intake/Output: I/O last 3 completed shifts: In: 6272.9 [I.V.:5303.3; Other:100; NG/GT:470; IV Piggyback:399.6] Out: 9629 [Other:6478]   Intake/Output this shift:  Total I/O In: -  Out: 138 [Other:138]  Thick webbed neck  CVS- RRR RS- CTA ABD- BS present soft non-distended EXT- no edema   Basic  Metabolic Panel: Recent Labs  Lab 09/06/2017 0810  09/28/17 0315  09/29/17 0400 09/29/17 0420 09/29/17 1517 09/29/17 1519 09/30/17 0400 09/30/17 1538 10/01/17 0315  NA 132*   < > 135   < >  --  134* 136 135 136 137 137  K 5.9*   < > 3.7   < >  --  4.0 5.2* 4.1 4.3 4.1 4.2  CL 93*   < > 100   < >  --  101 103 100 106 105 105  CO2 17*   < > 21*   < >  --  18* 21*  --  21* 23 23  GLUCOSE 194*   < > 183*   < >  --  144* 133* 145* 115* 140* 154*  BUN 27*   < > 21   < >  --  <5* <5* <3* <5* <5* <5*  CREATININE 1.98*   < > 1.57*   < >  --  0.78 0.56 0.50 0.55 0.46 0.49  CALCIUM 9.3   < > 7.3*   < >  --  6.8* 7.0*  --  7.1* 7.3* 7.1*  MG 2.6*  --  2.5*  --  2.2  --   --   --  2.1  --  2.4  PHOS 7.5*  --  4.9*   < >  --  2.7 1.8*  --  1.8* 1.6* 2.3*   < > = values in this interval not displayed.    Liver Function Tests: Recent  Labs  Lab 09/05/2017 0810  09/29/17 0420 09/29/17 1517 09/30/17 0400 09/30/17 1538 10/01/17 0315  AST 216*  --   --   --   --   --   --   ALT 223*  --   --   --   --   --   --   ALKPHOS 105  --   --   --   --   --   --   BILITOT 3.5*  --   --   --   --   --   --   PROT 5.8*  --   --   --   --   --   --   ALBUMIN 3.4*   < > 2.6* 2.7* 3.0* 2.7* 2.7*   < > = values in this interval not displayed.   No results for input(s): LIPASE, AMYLASE in the last 168 hours. No results for input(s): AMMONIA in the last 168 hours.  CBC: Recent Labs  Lab 09/26/2017 0810  09/28/17 0315 09/29/17 0400 09/29/17 1519 09/30/17 0400 10/01/17 0315 10/01/17 0645  WBC 19.0*  --  25.3* 24.1*  --  21.2* 19.9* 19.9*  NEUTROABS 14.8*  --   --   --   --  19.8*  --  18.9*  HGB 15.9*   < > 14.5 14.1 15.3* 12.1 12.6 12.7  HCT 52.0*   < > 44.8 45.4 45.0 39.2 40.2 40.3  MCV 106.8*  --  101.4* 105.1*  --  104.5* 104.7* 104.7*  PLT 325  --  204 123*  --  54* 25* 25*  25*   < > = values in this interval not displayed.    Cardiac Enzymes: Recent Labs  Lab 09/25/2017 1821  CKTOTAL 257*     BNP: Invalid input(s): POCBNP  CBG: Recent Labs  Lab 09/30/17 1546 09/30/17 1937 09/30/17 2321 10/01/17 0317 10/01/17 0800  GLUCAP 133* 144* 147* 72* 122*    Microbiology: Results for orders placed or performed during the hospital encounter of 09/20/2017  Culture, blood (routine x 2)     Status: None (Preliminary result)   Collection Time: 09/07/2017  8:56 AM  Result Value Ref Range Status   Specimen Description BLOOD RIGHT HAND  Final   Special Requests   Final    BOTTLES DRAWN AEROBIC ONLY Blood Culture results may not be optimal due to an inadequate volume of blood received in culture bottles   Culture   Final    NO GROWTH 4 DAYS Performed at [facility], [place] [address], [place], [place] [address]    Report Status PENDING  Incomplete  Culture, blood (routine x 2)     Status: None (Preliminary result)   Collection Time: 09/29/2017  9:10 AM  Result Value Ref Range Status   Specimen Description BLOOD RIGHT ANTECUBITAL  Final   Special Requests   Final    BOTTLES DRAWN AEROBIC AND ANAEROBIC Blood Culture adequate volume   Culture   Final    NO GROWTH 4 DAYS Performed at [facility], [place] [address] [place], [place] [address]    Report Status PENDING  Incomplete    Coagulation Studies: Recent Labs    10/01/17 0645  LABPROT 33.1*  INR 3.28    Urinalysis: No results for input(s): COLORURINE, LABSPEC, PHURINE, GLUCOSEU, HGBUR, BILIRUBINUR, KETONESUR, PROTEINUR, UROBILINOGEN, NITRITE, LEUKOCYTESUR in the last 72 hours.  Invalid input(s): APPERANCEUR    Imaging: Dg Chest Port 1 View  Result  Date: 10/01/2017 CLINICAL DATA:  Check endotracheal tube placement EXAM: PORTABLE CHEST 1 VIEW COMPARISON:  09/30/2017 FINDINGS: Endotracheal tube, feeding catheter and left jugular central line are again noted and stable. The cardiac shadow remains enlarged. The lungs are well aerated but now demonstrate diffuse bilateral perihilar infiltrates likely related to  edema given the acute nature of the change. No bony abnormality is noted. IMPRESSION: Changes most consistent with bilateral perihilar pulmonary edema. Electronically Signed   By: Inez Catalina M.D.   On: 10/01/2017 08:10   Dg Chest Port 1 View  Result Date: 09/30/2017 CLINICAL DATA:  Respiratory failure. EXAM: PORTABLE CHEST 1 VIEW COMPARISON:  Radiograph of September 29, 2017. FINDINGS: Stable cardiomegaly. Endotracheal and left internal jugular catheter are unchanged in position. Interval placement of feeding tube seen entering stomach. No pneumothorax is noted. Bibasilar subsegmental atelectasis is noted with small pleural effusions. Bony thorax is unremarkable. IMPRESSION: Interval placement of feeding tube. Otherwise stable support apparatus. Stable bibasilar opacities as described above. Electronically Signed   By: Marijo Conception, M.D.   On: 09/30/2017 09:29   Dg Abd Portable 1v  Result Date: 09/29/2017 CLINICAL DATA:  NG tube placement EXAM: PORTABLE ABDOMEN - 1 VIEW COMPARISON:  None. FINDINGS: Feeding tube projects over the proximal stomach. Cardiomegaly. Bilateral lower lobe airspace opacities and effusions. Gas within mildly prominent bowel loops, likely colon. IMPRESSION: Feeding tube tip projects over the proximal stomach. Electronically Signed   By: Rolm Baptise M.D.   On: 09/29/2017 18:10     Medications:   . sodium chloride 50 mL/hr at 10/01/17 0700  . sodium chloride    . sodium chloride    . famotidine (PEPCID) IV Stopped (09/30/17 2230)  . levofloxacin (LEVAQUIN) IV Stopped (09/30/17 1229)  . norepinephrine (LEVOPHED) Adult infusion 38 mcg/min (10/01/17 0700)  . phenylephrine (NEO-SYNEPHRINE) Adult infusion Stopped (10/01/17 0452)  . dialysis replacement fluid (prismasate) 700 mL/hr at 10/01/17 0242  . dialysis replacement fluid (prismasate) 800 mL/hr at 10/01/17 0213  . dialysate (PRISMASATE) 2,000 mL/hr at 10/01/17 0729  . sodium chloride    . vasopressin (PITRESSIN)  infusion - *FOR SHOCK* 0.03 Units/min (10/01/17 0700)   . amiodarone  200 mg Oral Daily  . atorvastatin  20 mg Oral Daily  . B-complex with vitamin C  1 tablet Per Tube Daily  . chlorhexidine gluconate (MEDLINE KIT)  15 mL Mouth Rinse BID  . Chlorhexidine Gluconate Cloth  6 each Topical Q0600  . feeding supplement (VITAL HIGH PROTEIN)  1,000 mL Per Tube Q24H  . hydrocortisone sod succinate (SOLU-CORTEF) inj  50 mg Intravenous Q6H  . insulin aspart  0-9 Units Subcutaneous Q4H  . levothyroxine  37.5 mcg Intravenous Daily  . mouth rinse  15 mL Mouth Rinse 10 times per day  . sodium chloride flush  10-40 mL Intracatheter Q12H  . sodium chloride flush  3 mL Intravenous Q12H   Place/Maintain arterial line **AND** sodium chloride, sodium chloride, acetaminophen **OR** acetaminophen, dextrose, fentaNYL (SUBLIMAZE) injection, midazolam, ondansetron (ZOFRAN) IV, sodium chloride, sodium chloride flush, sodium chloride flush  Assessment/ Plan:   1. Acute renal failure in setting of shock - septic versus cardiogenic with severe aortic regurgitation  Consultation with cardiology. She continues on CRRT at this time 2D echo estimated ejection fraction 30 to 35% severe aortic regurgitation with increased regurgitation pressure half-time.  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  Continues on CRRT at this time with no  changes to prescription.  No anticoagulation through CRRT.  Questioning HIT and therefore no heparin 2. Hypotension  MAP 90 continues on pressors.  Weaned off phenylephrine continues vasopressin and norepinephrine 3. PAF  On heparin and amiodarone 4. Aortic regurgitation  Severe awaiting cardiology input 5.  Hyperkalemia resolved with CRRT 6. Metabolic acidosis resolved with CRRT 7. Hypothyroidism   Last TSH  8.27 8. Ventilator dependent respiratory failure worsening oxygen requirement we will try to remove  more fluid through CRRT 9. Empiric levoquin  Questioning UTI as cause had received vancomycin 10.  Thrombocytopenia.  DIC panel positive with decreased fibrinogen increased D-dimers increased coagulation factors.  All heparin products discontinued at this time HIT panel pending    LOS: [place] @TODAY @8 :51 AM

## 2017-10-01 NOTE — Progress Notes (Signed)
Pt sO2 on ABG 87%, correlating with monitor. Pt not in apparent distress. RT notified, FiO2 turned up to 80%. Will continue to monitor.  Herma Ard, RN

## 2017-10-02 LAB — POCT I-STAT 3, ART BLOOD GAS (G3+)
Acid-base deficit: 2 mmol/L (ref 0.0–2.0)
Bicarbonate: 23.8 mmol/L (ref 20.0–28.0)
O2 Saturation: 88 %
PH ART: 7.35 (ref 7.350–7.450)
TCO2: 25 mmol/L (ref 22–32)
pCO2 arterial: 42.7 mmHg (ref 32.0–48.0)
pO2, Arterial: 56 mmHg — ABNORMAL LOW (ref 83.0–108.0)

## 2017-10-02 LAB — RENAL FUNCTION PANEL
ANION GAP: 6 (ref 5–15)
Albumin: 2.5 g/dL — ABNORMAL LOW (ref 3.5–5.0)
BUN: 5 mg/dL — ABNORMAL LOW (ref 8–23)
CO2: 23 mmol/L (ref 22–32)
Calcium: 6.8 mg/dL — ABNORMAL LOW (ref 8.9–10.3)
Chloride: 106 mmol/L (ref 98–111)
Creatinine, Ser: 0.48 mg/dL (ref 0.44–1.00)
GFR calc non Af Amer: >60 mL/min (ref >60)
GLUCOSE: 136 mg/dL — AB (ref 70–99)
POTASSIUM: 4.5 mmol/L (ref 3.5–5.1)
Phosphorus: 1.8 mg/dL — ABNORMAL LOW (ref 2.5–4.6)
Sodium: 135 mmol/L (ref 135–145)

## 2017-10-02 LAB — CULTURE, BLOOD (ROUTINE X 2)
CULTURE: NO GROWTH
Culture: NO GROWTH
Special Requests: ADEQUATE

## 2017-10-02 LAB — CBC
HCT: 42.5 % (ref 36.0–46.0)
HEMOGLOBIN: 13.2 g/dL (ref 12.0–15.0)
MCH: 32.7 pg (ref 26.0–34.0)
MCHC: 31.1 g/dL (ref 30.0–36.0)
MCV: 105.2 fL — ABNORMAL HIGH (ref 78.0–100.0)
Platelets: 21 10*3/uL — CL (ref 150–400)
RBC: 4.04 MIL/uL (ref 3.87–5.11)
RDW: 15 % (ref 11.5–15.5)
WBC: 18.9 10*3/uL — AB (ref 4.0–10.5)

## 2017-10-02 LAB — COOXEMETRY PANEL
Carboxyhemoglobin: 1.1 % (ref 0.5–1.5)
METHEMOGLOBIN: 0.7 % (ref 0.0–1.5)
O2 Saturation: 40.4 %
Total hemoglobin: 13.9 g/dL (ref 12.0–16.0)

## 2017-10-02 LAB — MAGNESIUM: MAGNESIUM: 2.3 mg/dL (ref 1.7–2.4)

## 2017-10-02 LAB — GLUCOSE, CAPILLARY
GLUCOSE-CAPILLARY: 133 mg/dL — AB (ref 70–99)
GLUCOSE-CAPILLARY: 126 mg/dL — AB (ref 70–99)
GLUCOSE-CAPILLARY: 103 mg/dL — AB (ref 70–99)
Glucose-Capillary: 131 mg/dL — ABNORMAL HIGH (ref 70–99)

## 2017-10-02 LAB — HEPARIN INDUCED PLATELET AB (HIT ANTIBODY)
Heparin Induced Plt Ab: 0.158 OD (ref 0.000–0.400)
Heparin Induced Plt Ab: 0.181 OD (ref 0.000–0.400)

## 2017-10-02 LAB — TYPE AND SCREEN
ABO/RH(D): AB POS
Antibody Screen: NEGATIVE

## 2017-10-02 LAB — ABO/RH: ABO/RH(D): AB POS

## 2017-10-02 MED ORDER — LIDOCAINE VISCOUS HCL 2 % MT SOLN
15.0000 mL | Freq: Four times a day (QID) | OROMUCOSAL | Status: DC | PRN
  Filled 2017-10-02: qty 15

## 2017-10-02 MED ORDER — MIDAZOLAM HCL 2 MG/2ML IJ SOLN
4.0000 mg | Freq: Once | INTRAMUSCULAR | Status: AC
  Administered 2017-10-02: 4 mg via INTRAVENOUS

## 2017-10-02 MED ORDER — SODIUM CHLORIDE 0.9% IV SOLUTION
Freq: Once | INTRAVENOUS | Status: AC
  Administered 2017-10-02: 12:00:00 via INTRAVENOUS

## 2017-10-02 MED ORDER — FENTANYL CITRATE (PF) 100 MCG/2ML IJ SOLN: 200.0000 ug | Freq: Once | INTRAMUSCULAR | Status: DC

## 2017-10-02 MED ORDER — VITAMIN K1 10 MG/ML IJ SOLN
10.0000 mg | Freq: Once | INTRAVENOUS | Status: AC
  Administered 2017-10-02: 10 mg via INTRAVENOUS
  Filled 2017-10-02: qty 1

## 2017-10-02 MED ORDER — LEVOTHYROXINE SODIUM 75 MCG PO TABS: 75.0000 ug | ORAL_TABLET | Freq: Every day | ORAL | Status: DC

## 2017-10-02 MED ORDER — FENTANYL CITRATE (PF) 100 MCG/2ML IJ SOLN
INTRAMUSCULAR | Status: AC
  Administered 2017-10-02: 200 ug
  Filled 2017-10-02: qty 2

## 2017-10-03 LAB — PREPARE CRYOPRECIPITATE: UNIT DIVISION: 0

## 2017-10-03 LAB — PREPARE PLATELET PHERESIS: Unit division: 0

## 2017-10-03 LAB — BPAM CRYOPRECIPITATE
Blood Product Expiration Date: 201909281640
ISSUE DATE / TIME: 201909281136
Unit Type and Rh: 6200

## 2017-10-03 LAB — BPAM PLATELET PHERESIS
Blood Product Expiration Date: 201909282359
ISSUE DATE / TIME: 201909281137
Unit Type and Rh: 7300

## 2017-10-05 NOTE — Consult Note (Signed)
Advanced Heart Failure Team Progress Note   HPI:    Remains intubated on high dose pressors.  On CVVHD. Jaundiced. Non-responsive.   Objective:    Vital Signs:   Temp:  [95 F (35 C)-97.7 F (36.5 C)] 97.2 F (36.2 C) (09/28 1400) Pulse Rate:  [53-112] 110 (09/28 1233) Resp:  [23-37] 32 (09/28 1400) BP: (55-114)/(18-49) 82/29 (09/28 1400) SpO2:  [90 %-100 %] 100 % (09/28 1116) FiO2 (%):  [80 %-100 %] 80 % (09/28 1116) Weight:  [52.6 kg] 52.6 kg (09/28 0600) Last BM Date: (pta)  Weight change: Filed Weights   09/30/17 0340 10/01/17 0600 10-28-17 0600  Weight: 62.5 kg 63.9 kg 52.6 kg    Intake/Output:   Intake/Output Summary (Last 24 hours) at Oct 28, 2017 1513 Last data filed at 10-28-2017 1400 Gross per 24 hour  Intake 2458.76 ml  Output 4230 ml  Net -1771.24 ml      Physical Exam    General:  Intubated nonresponcie HEENT: normal + ETT Neck: web neck LIJ dialysis cath Cor: PMI nondisplaced. Tachy irregular + AI Lungs: rhonchi Abdomen: obese soft, nontender, nondistended. No hepatosplenomegaly. No bruits or masses. Good bowel sounds. Extremities: no cyanosis, clubbing, rash, edema Neuro: non responsive   Telemetry   A  Fib 100s   EKG    N/A  Labs   Basic Metabolic Panel: Recent Labs  Lab 09/28/17 0315  09/29/17 0400  09/30/17 0400 09/30/17 1538 10/01/17 0315 10/01/17 1511 October 28, 2017 0314  NA 135   < >  --    < > 136 137 137 135 135  K 3.7   < >  --    < > 4.3 4.1 4.2 4.4 4.5  CL 100   < >  --    < > 106 105 105 105 106  CO2 21*   < >  --    < > 21* 23 23 24 23   GLUCOSE 183*   < >  --    < > 115* 140* 154* 148* 136*  BUN 21   < >  --    < > <5* <5* <5* <5* 5*  CREATININE 1.57*   < >  --    < > 0.55 0.46 0.49 0.47 0.48  CALCIUM 7.3*   < >  --    < > 7.1* 7.3* 7.1* 6.8* 6.8*  MG 2.5*  --  2.2  --  2.1  --  2.4  --  2.3  PHOS 4.9*   < >  --    < > 1.8* 1.6* 2.3* 1.8* 1.8*   < > = values in this interval not displayed.    Liver Function  Tests: Recent Labs  Lab 09/26/2017 0810  09/30/17 1538 10/01/17 0315 10/01/17 1041 10/01/17 1511 10-28-2017 0314  AST 216*  --   --   --  1,086*  --   --   ALT 223*  --   --   --  3,216*  --   --   ALKPHOS 105  --   --   --  142*  --   --   BILITOT 3.5*  --   --   --  13.9*  --   --   PROT 5.8*  --   --   --  3.8*  --   --   ALBUMIN 3.4*   < > 2.7* 2.7* 2.5* 2.5* 2.5*   < > = values in this interval not displayed.  No results for input(s): LIPASE, AMYLASE in the last 168 hours. No results for input(s): AMMONIA in the last 168 hours.  CBC: Recent Labs  Lab 09/22/2017 0810  09/29/17 0400 09/29/17 1519 09/30/17 0400 10/01/17 0315 10/01/17 0645 October 05, 2017 0314  WBC 19.0*   < > 24.1*  --  21.2* 19.9* 19.9* 18.9*  NEUTROABS 14.8*  --   --   --  19.8*  --  18.9*  --   HGB 15.9*   < > 14.1 15.3* 12.1 12.6 12.7 13.2  HCT 52.0*   < > 45.4 45.0 39.2 40.2 40.3 42.5  MCV 106.8*   < > 105.1*  --  104.5* 104.7* 104.7* 105.2*  PLT 325   < > 123*  --  54* 25* 25*  25* 21*   < > = values in this interval not displayed.    Cardiac Enzymes: Recent Labs  Lab 09/12/2017 1821  CKTOTAL 257*    BNP: BNP (last 3 results) Recent Labs    08/16/17 1832 09/15/17 1724 09/26/2017 0810  BNP 1,720.4* 3,020.7* >4,500.0*    ProBNP (last 3 results) Recent Labs    09/10/17 1535  PROBNP 3,174.0*     CBG: Recent Labs  Lab 10/01/17 1932 10/01/17 2310 10-05-17 0316 10-05-2017 0802 10-05-17 1139  GLUCAP 134* 133* 126* 131* 103*    Coagulation Studies: Recent Labs    10/01/17 0645  LABPROT 33.1*  INR 3.28     Imaging   No results found.   Medications:     Current Medications: . amiodarone  200 mg Oral Daily  . B-complex with vitamin C  1 tablet Per Tube Daily  . chlorhexidine gluconate (MEDLINE KIT)  15 mL Mouth Rinse BID  . Chlorhexidine Gluconate Cloth  6 each Topical Q0600  . feeding supplement (PRO-STAT SUGAR FREE 64)  60 mL Oral TID  . feeding supplement (VITAL HIGH  PROTEIN)  1,000 mL Per Tube Q24H  . fentaNYL (SUBLIMAZE) injection  200 mcg Intravenous Once  . hydrocortisone sod succinate (SOLU-CORTEF) inj  50 mg Intravenous Q6H  . insulin aspart  0-9 Units Subcutaneous Q4H  . levothyroxine  75 mcg Per Tube QAC breakfast  . mouth rinse  15 mL Mouth Rinse 10 times per day  . multivitamin  15 mL Oral Daily  . sodium chloride flush  10-40 mL Intracatheter Q12H  . sodium chloride flush  3 mL Intravenous Q12H    Infusions: . sodium chloride    . sodium chloride    . famotidine (PEPCID) IV Stopped (10/01/17 2138)  . levofloxacin (LEVAQUIN) IV 50 mL/hr at 2017/10/05 1200  . norepinephrine (LEVOPHED) Adult infusion 29 mcg/min (10-05-17 1200)  . dialysis replacement fluid (prismasate) 700 mL/hr at 05-Oct-2017 0925  . dialysis replacement fluid (prismasate) 800 mL/hr at 10/05/17 0513  . dialysate (PRISMASATE) 2,000 mL/hr at 05-Oct-2017 0754  . sodium chloride    . vasopressin (PITRESSIN) infusion - *FOR SHOCK* 0.03 Units/min (10-05-2017 1200)      Patient Profile   [name] is a 62 year old with a history of turner syndrome, hyporthyroidism, VSD with AI, chronic A fib, OSA on Bipap, VT, and chronic diastolic heart failure.   Admitted with lactic acidosis, respiratory failure, and hypotension.   Assessment/Plan   1. Shock  - Cardiogenic/Septic - EF 20-25% with severe AI 2.Acute Renal Failure - Started CVVHD 9/23 3. Acute Hypoxic Respiratory Failure - Intubated 09/26/2017 FiO2 100%.  4. PAF - with RVR 5. Aortic Regurgitation -severe 6. Acute liver  failure due to shock liver  7. DIC with thrombocytopenia 8. Turner Syndrome.    She continues to suffer with MSOF. Remains on CVVHD and high-dose pressors. I spoke with her mother and uncle and have advised withdrawal of support.   Her family has gathered at bedside and has agreed with withdrawal of support.   She was disconnected from CVVHD and given versed and fentanyl. I personally stopped her gtts and  terminally extubated her. She died within 36 mins with family at her bedside. Time of [id number].   CRITICAL CARE Performed by: Glori Bickers  Total critical care time: 60 minutes  Critical care time was exclusive of separately billable procedures and treating other patients.  Critical care was necessary to treat or prevent imminent or life-threatening deterioration.  Critical care was time spent personally by me (independent of midlevel providers or residents) on the following activities: development of treatment plan with patient and/or surrogate as well as nursing, discussions with consultants, evaluation of patient's response to treatment, examination of patient, obtaining history from patient or surrogate, ordering and performing treatments and interventions, ordering and review of laboratory studies, ordering and review of radiographic studies, pulse oximetry and re-evaluation of patient's condition.  Glori Bickers, MD  3:13 PM

## 2017-10-05 NOTE — Progress Notes (Signed)
1610:  [facility] notified.   Referral number:  [id number] spoke with April Shore

## 2017-10-05 NOTE — Progress Notes (Signed)
[facility] ROUNDING NOTE   Subjective:   Acute renal failure with hypotension , lactic acidosis and history of atrial fibrillation with RVR  Cardioversion 09/15/17   Patient has Turners syndrome VSD  SVT and Aortic insufficiency and hypothyroidism She has been started on CRRT and is tolerating this well so far. RHC/LHC 07/20/17 >> EF 50%, severe aortic regurgitation, non obstructive CAD, normal PA pressures, no shunt   Patient does not appear to be responding appropriately to questions or commands.  Continues phenylephrine, vasopressin and neoepinephrine although it appears the requirements for pressors are decreasing.  She is also on an amiodarone drip.  Anuric with no urine output.  Map measured at 38 blood pressure 94/19 temperature 97.2 IV phenylephrine vasopressin norepinephrine planning withdrawal of care later today    Renal ultrasound no hydronephrosis.  Chest x-ray consistent with bilateral perihilar pulmonary edema   Objective:  Vital signs in last 24 hours:  Temp:  [95 F (35 C)-97.7 F (36.5 C)] 97.2 F (36.2 C) (09/28 1204) Pulse Rate:  [53-112] 110 (09/28 1233) Resp:  [23-37] 32 (09/28 1233) BP: (86-120)/(18-49) 93/18 (09/28 1233) SpO2:  [90 %-100 %] 100 % (09/28 1116) FiO2 (%):  [80 %-100 %] 80 % (09/28 1116) Weight:  [52.6 kg] 52.6 kg (09/28 0600)  Weight change: -11.3 kg Filed Weights   09/30/17 0340 10/01/17 0600 October 14, 2017 0600  Weight: 62.5 kg 63.9 kg 52.6 kg    Intake/Output: I/O last 3 completed shifts: In: 4120.1 [I.V.:2872; NG/GT:880; IV Piggyback:368.1] Out: 2992 [Other:5713]   Intake/Output this shift:  Total I/O In: 746.2 [P.O.:190; I.V.:330.6; Blood:54; IV Piggyback:171.6] Out: 596 [Other:596]  Thick webbed neck  CVS- RRR RS- CTA ABD- BS present soft non-distended EXT- no edema   Basic Metabolic Panel: Recent Labs  Lab 09/28/17 0315  09/29/17 0400  09/30/17 0400 09/30/17 1538 10/01/17 0315 10/01/17 1511 14-Oct-2017 0314   NA 135   < >  --    < > 136 137 137 135 135  K 3.7   < >  --    < > 4.3 4.1 4.2 4.4 4.5  CL 100   < >  --    < > 106 105 105 105 106  CO2 21*   < >  --    < > 21* 23 23 24 23   GLUCOSE 183*   < >  --    < > 115* 140* 154* 148* 136*  BUN 21   < >  --    < > <5* <5* <5* <5* 5*  CREATININE 1.57*   < >  --    < > 0.55 0.46 0.49 0.47 0.48  CALCIUM 7.3*   < >  --    < > 7.1* 7.3* 7.1* 6.8* 6.8*  MG 2.5*  --  2.2  --  2.1  --  2.4  --  2.3  PHOS 4.9*   < >  --    < > 1.8* 1.6* 2.3* 1.8* 1.8*   < > = values in this interval not displayed.    Liver Function Tests: Recent Labs  Lab 09/21/2017 0810  09/30/17 1538 10/01/17 0315 10/01/17 1041 10/01/17 1511 10/14/17 0314  AST 216*  --   --   --  1,086*  --   --   ALT 223*  --   --   --  3,216*  --   --   ALKPHOS 105  --   --   --  142*  --   --  BILITOT 3.5*  --   --   --  13.9*  --   --   PROT 5.8*  --   --   --  3.8*  --   --   ALBUMIN 3.4*   < > 2.7* 2.7* 2.5* 2.5* 2.5*   < > = values in this interval not displayed.   No results for input(s): LIPASE, AMYLASE in the last 168 hours. No results for input(s): AMMONIA in the last 168 hours.  CBC: Recent Labs  Lab 09/11/2017 0810  09/29/17 0400 09/29/17 1519 09/30/17 0400 10/01/17 0315 10/01/17 0645 10/22/2017 0314  WBC 19.0*   < > 24.1*  --  21.2* 19.9* 19.9* 18.9*  NEUTROABS 14.8*  --   --   --  19.8*  --  18.9*  --   HGB 15.9*   < > 14.1 15.3* 12.1 12.6 12.7 13.2  HCT 52.0*   < > 45.4 45.0 39.2 40.2 40.3 42.5  MCV 106.8*   < > 105.1*  --  104.5* 104.7* 104.7* 105.2*  PLT 325   < > 123*  --  54* 25* 25*  25* 21*   < > = values in this interval not displayed.    Cardiac Enzymes: Recent Labs  Lab 09/19/2017 1821  CKTOTAL 257*    BNP: Invalid input(s): POCBNP  CBG: Recent Labs  Lab 10/01/17 1932 10/01/17 2310 10/22/2017 0316 10-22-2017 0802 Oct 22, 2017 1139  GLUCAP 134* 133* 126* 131* 103*    Microbiology: Results for orders placed or performed during the hospital  encounter of 09/24/2017  Culture, blood (routine x 2)     Status: None   Collection Time: 09/20/2017  8:56 AM  Result Value Ref Range Status   Specimen Description BLOOD RIGHT HAND  Final   Special Requests   Final    BOTTLES DRAWN AEROBIC ONLY Blood Culture results may not be optimal due to an inadequate volume of blood received in culture bottles   Culture   Final    NO GROWTH 5 DAYS Performed at [facility], [place] [address], [place], [place] [address]    Report Status 10/22/2017 FINAL  Final  Culture, blood (routine x 2)     Status: None   Collection Time: 09/17/2017  9:10 AM  Result Value Ref Range Status   Specimen Description BLOOD RIGHT ANTECUBITAL  Final   Special Requests   Final    BOTTLES DRAWN AEROBIC AND ANAEROBIC Blood Culture adequate volume   Culture   Final    NO GROWTH 5 DAYS Performed at [facility], [place] [address] [place], [place] [address]    Report Status 22-Oct-2017 FINAL  Final    Coagulation Studies: Recent Labs    10/01/17 0645  LABPROT 33.1*  INR 3.28    Urinalysis: No results for input(s): COLORURINE, LABSPEC, PHURINE, GLUCOSEU, HGBUR, BILIRUBINUR, KETONESUR, PROTEINUR, UROBILINOGEN, NITRITE, LEUKOCYTESUR in the last 72 hours.  Invalid input(s): APPERANCEUR    Imaging: Dg Chest Port 1 View  Result Date: 10/01/2017 CLINICAL DATA:  Check endotracheal tube placement EXAM: PORTABLE CHEST 1 VIEW COMPARISON:  09/30/2017 FINDINGS: Endotracheal tube, feeding catheter and left jugular central line are again noted and stable. The cardiac shadow remains enlarged. The lungs are well aerated but now demonstrate diffuse bilateral perihilar infiltrates likely related to edema given the acute nature of the change. No bony abnormality is noted. IMPRESSION: Changes most consistent with bilateral perihilar pulmonary edema. Electronically Signed   By: Linus Mako.D.  On: 10/01/2017 08:10     Medications:   . sodium chloride    . sodium chloride     . famotidine (PEPCID) IV Stopped (10/01/17 2138)  . levofloxacin (LEVAQUIN) IV 50 mL/hr at Oct 15, 2017 1200  . norepinephrine (LEVOPHED) Adult infusion 29 mcg/min (15-Oct-2017 1200)  . dialysis replacement fluid (prismasate) 700 mL/hr at 2017-10-15 0925  . dialysis replacement fluid (prismasate) 800 mL/hr at 2017-10-15 0513  . dialysate (PRISMASATE) 2,000 mL/hr at 10-15-17 0754  . sodium chloride    . vasopressin (PITRESSIN) infusion - *FOR SHOCK* 0.03 Units/min (October 15, 2017 1200)   . amiodarone  200 mg Oral Daily  . B-complex with vitamin C  1 tablet Per Tube Daily  . chlorhexidine gluconate (MEDLINE KIT)  15 mL Mouth Rinse BID  . Chlorhexidine Gluconate Cloth  6 each Topical Q0600  . feeding supplement (PRO-STAT SUGAR FREE 64)  60 mL Oral TID  . feeding supplement (VITAL HIGH PROTEIN)  1,000 mL Per Tube Q24H  . hydrocortisone sod succinate (SOLU-CORTEF) inj  50 mg Intravenous Q6H  . insulin aspart  0-9 Units Subcutaneous Q4H  . levothyroxine  75 mcg Per Tube QAC breakfast  . mouth rinse  15 mL Mouth Rinse 10 times per day  . multivitamin  15 mL Oral Daily  . sodium chloride flush  10-40 mL Intracatheter Q12H  . sodium chloride flush  3 mL Intravenous Q12H   Place/Maintain arterial line **AND** sodium chloride, sodium chloride, acetaminophen **OR** acetaminophen, dextrose, fentaNYL (SUBLIMAZE) injection, lidocaine, midazolam, ondansetron (ZOFRAN) IV, sodium chloride, sodium chloride flush, sodium chloride flush  Assessment/ Plan:   1. Acute renal failure in setting of shock - septic versus cardiogenic with severe aortic regurgitation  Consultation with cardiology. She continues on CRRT at this time 2D echo estimated ejection fraction 30 to 35% severe aortic regurgitation with increased regurgitation pressure half-time.  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  Continues on CRRT at this time with no changes to  prescription.  No anticoagulation through CRRT.  Questioning HIT and therefore no heparin 2. Hypotension  MAP 90 continues on pressors.  Weaned off phenylephrine continues vasopressin and norepinephrine 3. PAF  On heparin and amiodarone 4. Aortic regurgitation  Severe awaiting cardiology input 5.  Hyperkalemia resolved with CRRT 6. Metabolic acidosis resolved with CRRT 7. Hypothyroidism   Last TSH  8.27 8. Ventilator dependent respiratory failure worsening oxygen requirement  9. Empiric levoquin  Questioning UTI as cause had received vancomycin 10.  Thrombocytopenia.  DIC panel positive with decreased fibrinogen increased D-dimers increased coagulation factors.  All heparin products discontinued at this time HIT panel pending 11.  Disposition agree with Dr. Haroldine Laws and planning withdrawal of care later today once family assembled may discontinue CRRT    LOS: [place] @TODAY @12 :43 PM

## 2017-10-05 NOTE — Progress Notes (Signed)
Dr. Gala Romney at bedside with family.  Dr. Gala Romney extubated pt and turned off all drips.  Chaplain called.  Fentanyl Versed 4mg  given.  Family at bedside.  \  1421:  Asystole noted on bedside ekg.  Dr. Gala Romney at bedside and pronounced.

## 2017-10-05 NOTE — Progress Notes (Signed)
   She continues to suffer with MSOF. Remains on CVVHD and high-dose pressors.   I spoke with her mother and uncle and have advised withdrawal of support.   They have family coming in this afternoon and will make final decision.  I will return later to discuss with them but plan for withdrawal at that time as she has no meaningful chance of survival.   CRITICAL CARE Performed by: Arvilla Meres  Total critical care time: 35 minutes  Critical care time was exclusive of separately billable procedures and treating other patients.  Critical care was necessary to treat or prevent imminent or life-threatening deterioration.  Critical care was time spent personally by me (independent of midlevel providers or residents) on the following activities: development of treatment plan with patient and/or surrogate as well as nursing, discussions with consultants, evaluation of patient's response to treatment, examination of patient, obtaining history from patient or surrogate, ordering and performing treatments and interventions, ordering and review of laboratory studies, ordering and review of radiographic studies, pulse oximetry and re-evaluation of patient's condition.  Arvilla Meres, MD  10:51 AM

## 2017-10-05 NOTE — Progress Notes (Signed)
62 yo female adm 9/23 with A fib and RVR, hypotension, acute renal failure, hypoxia, and lactic acidosis.  She was in hospital in September 2019 with A fib with RVR and severe aortic insufficiency.  She had cardioversion on 09/15/17, and then had bradycardia.  Her amiodarone dose was reduced.  She is followed by Dr. Vassie Loll for OSA on Bipap. Admitted with AMs, hypotension & AKI, presumed sepsis with lactic acidosis  Past Medical History Turner's syndrome, VSD, SVT, Aortic insufficiency, A fib, OSA, OA, Hypothyroidism, Hypertension, Difficult intubation, Chronic diastolic CHF, CAD  She remains critically ill on pressors and CRRT, hypothermic. She has required higher FiO2  To 100% Remains unresponsive off sedation, decreased breath sounds bilateral, 2+ bipedal edema, S1-S2 tacky regular, soft nontender abdomen. Line sites appear okay.  RN reports bleeding from line sites  Labs reviewed which show normal electrolytes, leukocytosis and severe thrombocytopenia LFTs elevated with low fibrinogen and elevated INR. Chest x-ray personally reviewed which shows edema pattern   Impression/plan  Appears to be cardiogenic shock due to severe aortic insufficiency and acute on chronic diastolic CHF -continue levo fed wean with goal systolic 19 above.  Low diastolic noted related to aortic regurg.  On amiodarone for atrial fibrillation RVR, heparin discontinued due to bleeding from central line sites  DIC picture likely due to shock liver and coagulopathy with low fibrinogen-due to bleeding from central line sites will transfuse 1 unit cryoprecipitate and 1 unit of pooled platelets for severe thrombus cytopenia  Acute hypoxic respiratory failure-had PEEP of 8.  AKI-continue CRRT.  Poor prognosis given to mother who continues to have difficulty making decision about ventilator withdrawal.  Appreciate heart failure service involvement and emphasizing poor prognosis.  We will continue this discussion  My  independent critical care time x 59m  Cyril Mourning MD. FCCP.  Pulmonary & Critical care Pager [phone] If no response call [phone]   09/12/2017

## 2017-10-05 NOTE — Progress Notes (Signed)
Pt was found extubated by Dr. Gala Romney.  Vent off

## 2017-10-05 DEATH — deceased

## 2017-10-07 ENCOUNTER — Encounter (HOSPITAL_COMMUNITY): Payer: Self-pay | Admitting: *Deleted

## 2017-10-07 NOTE — Progress Notes (Signed)
Death Certificate completed and signed by Dr Gala Romney, copy faxed to [facility] at [phone], they are aware to p/u original

## 2017-10-08 ENCOUNTER — Ambulatory Visit: Payer: Medicaid Other | Admitting: Physician Assistant

## 2017-10-13 ENCOUNTER — Telehealth: Payer: Self-pay | Admitting: Pulmonary Disease

## 2017-10-13 NOTE — Telephone Encounter (Signed)
Spoke with [name] at [employer] is rental and daughter will need to call [phone] and arrange for machine to be returned.    [name] is aware of above and will contact [facility]. Nothing more needed at this time.

## 2017-10-21 ENCOUNTER — Telehealth: Payer: Self-pay | Admitting: Cardiovascular Disease

## 2017-10-21 NOTE — Telephone Encounter (Signed)
Spoke with the patient's mother, she would like to talk with Dr. Elease Hashimoto to explain the events in the hospital.

## 2017-10-21 NOTE — Telephone Encounter (Signed)
New Message   Pt's mother is calling, states that she is wanting Dr. Elease Hashimoto to explain to her what happened to her daughter from the time she went into the hospital to the time she passed away, preferably in a letter. She says she is not angry, just emotional and upset. Please call

## 2017-10-24 NOTE — Telephone Encounter (Signed)
I called Randa Evens and discussed Melen[place]'s case Randa Evens wanted to make sure that she had not done anything wrong I reassured Randa Evens that she had provided great support for [place].

## 2017-11-05 NOTE — Discharge Summary (Signed)
63 yo female adm 9/23 with A fib and RVR, hypotension, acute renal failure, hypoxia, and lactic acidosis.  She was in hospital in September 2019 with A fib with RVR and severe aortic insufficiency.  She had cardioversion on 09/15/17, and then had bradycardia.  Her amiodarone dose was reduced.  She is followed by Dr. Vassie Loll for OSA on Bipap. Admitted with AMs, hypotension & AKI, presumed sepsis with lactic acidosis  Past Medical History Turner's syndrome, VSD, SVT, Aortic insufficiency, A fib, OSA, OA, Hypothyroidism, Hypertension, Difficult intubation, Chronic diastolic CHF, CAD  COURSE -she remains critically ill with multiorgan failure  Appears to be cardiogenic shock due to severe aortic insufficiency and acute on chronic diastolic CHF -supported by Levophed  On amiodarone for atrial fibrillation RVR, heparin discontinued due to bleeding from central line sites  DIC picture likely due to shock liver and coagulopathy with low fibrinogen  Acute hypoxic respiratory failure-had PEEP of 8.  AKI-supported by CRRT.  Poor prognosis given to mother who had difficulty making decision about ventilator withdrawal.  Seen by  heart failure service  Who emphasized poor prognosis.   Family finally agreed to withdrawal of life support and she passed away soon after.  Cause of death-cardiogenic shock, AKI, acute hypoxic respiratory failure due to severe aortic regurgitation, chronic diastolic heart failure  Cyril Mourning MD. FCCP. [facility]   10/11/2017

## 2017-11-22 ENCOUNTER — Ambulatory Visit: Payer: Medicaid Other | Admitting: Cardiovascular Disease

## 2018-10-04 ENCOUNTER — Telehealth (HOSPITAL_COMMUNITY): Payer: Self-pay | Admitting: *Deleted

## 2018-10-04 NOTE — Telephone Encounter (Signed)
Mother of the deceased patient left VM requesting a letter from [place] about daughters last admission from day one until she passed explaining everything that happened. [name] (mother) stated she cant stop thinking about it and she cant understand it. I see where Dr.Nahser called her on 10/21/17 and discussed the case with [name].   Routed to [place]

## 2018-11-21 IMAGING — DX DG CHEST 1V PORT
1 series · 1 of 1 positions shown · non-contrast
Comparison: September 10, 2016

CLINICAL DATA: Ventricular tachycardia.  Hypertension

EXAM:
PORTABLE CHEST 1 VIEW

[chest]
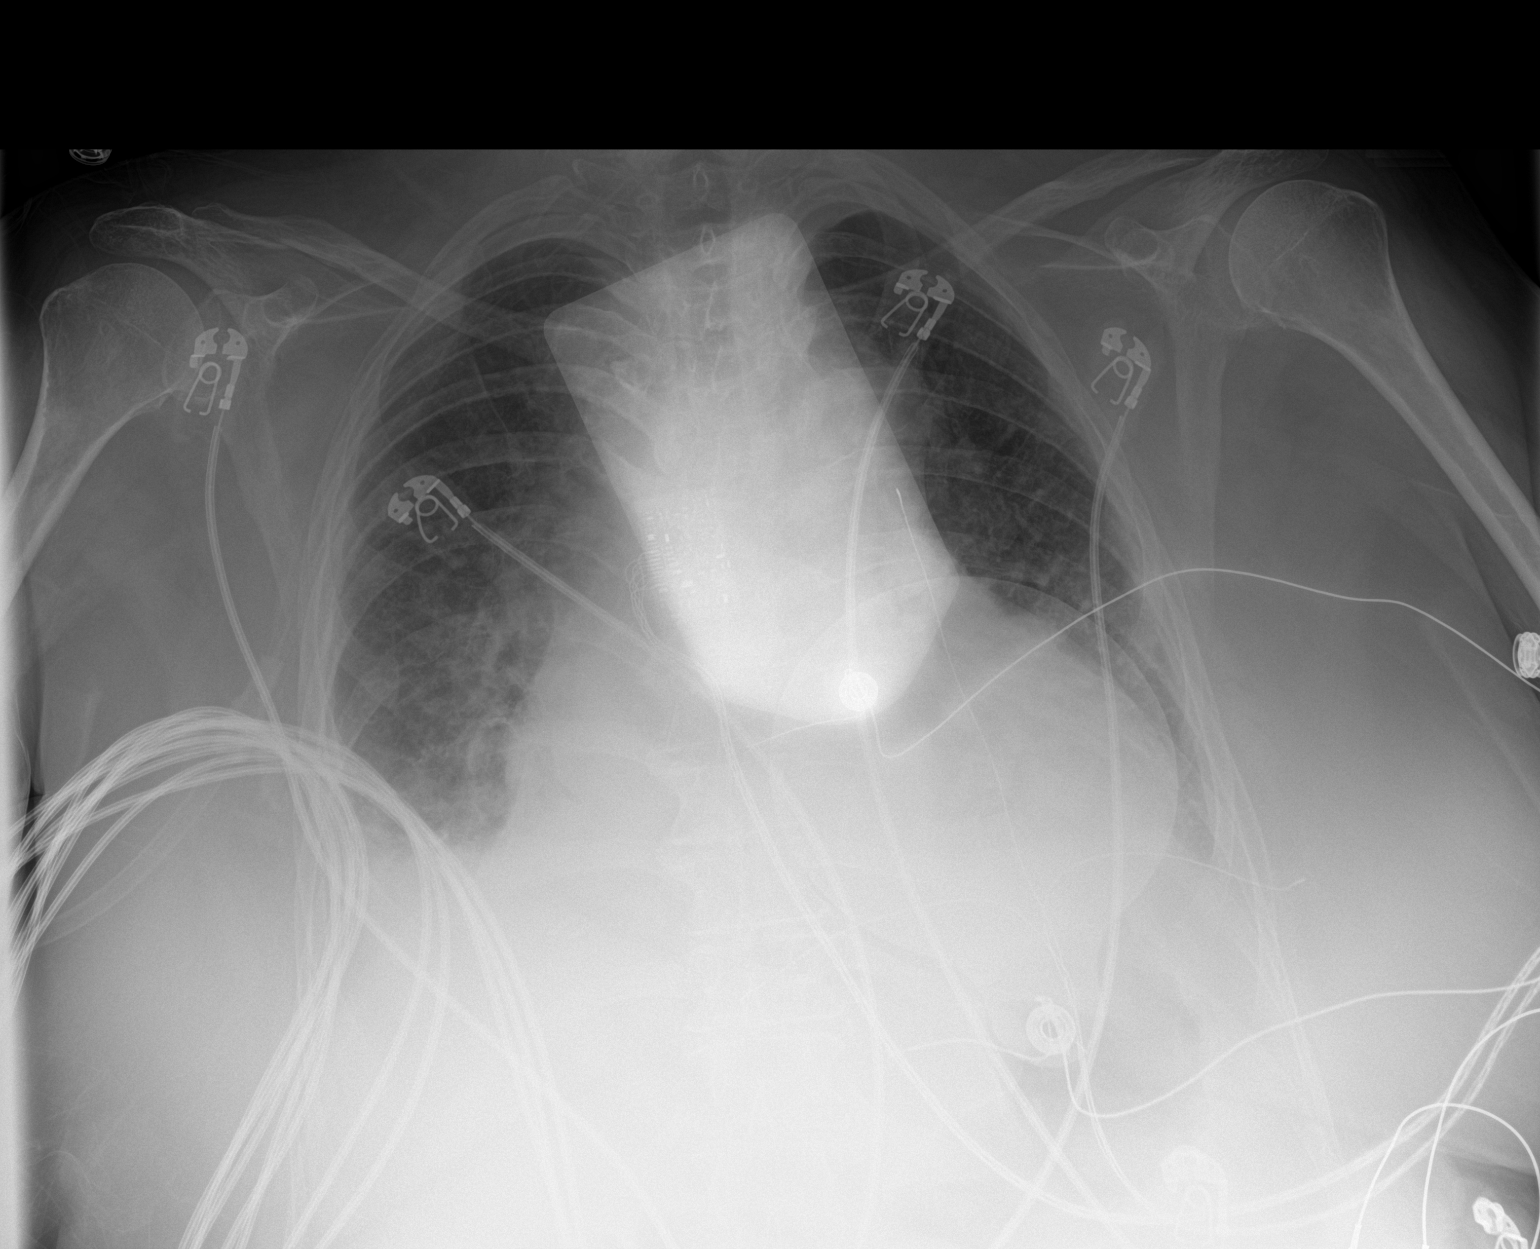

[1 of 1 positions shown; findings below may reference images not displayed]

FINDINGS: Monitoring devices overlie portions of the chest. There is no
demonstrable edema or consolidation. There are minimal pleural
effusions bilaterally. There is cardiomegaly with pulmonary
vascularity normal. No adenopathy. There is aortic atherosclerosis.
No evident bone lesions. There is calcification in the left carotid
artery.
IMPRESSION: Minimal pleural effusions bilaterally. No frank edema or
consolidation. Stable cardiomegaly. There is aortic atherosclerosis
as well as calcification in the left carotid artery.

Aortic Atherosclerosis ([id number]).

## 2018-11-22 IMAGING — DX DG CHEST 1V PORT
1 series · 1 of 1 positions shown · non-contrast
Comparison: 09/27/2017.

CLINICAL DATA: Intubation.  Hypoxia.

EXAM:
PORTABLE CHEST 1 VIEW

[chest ap]
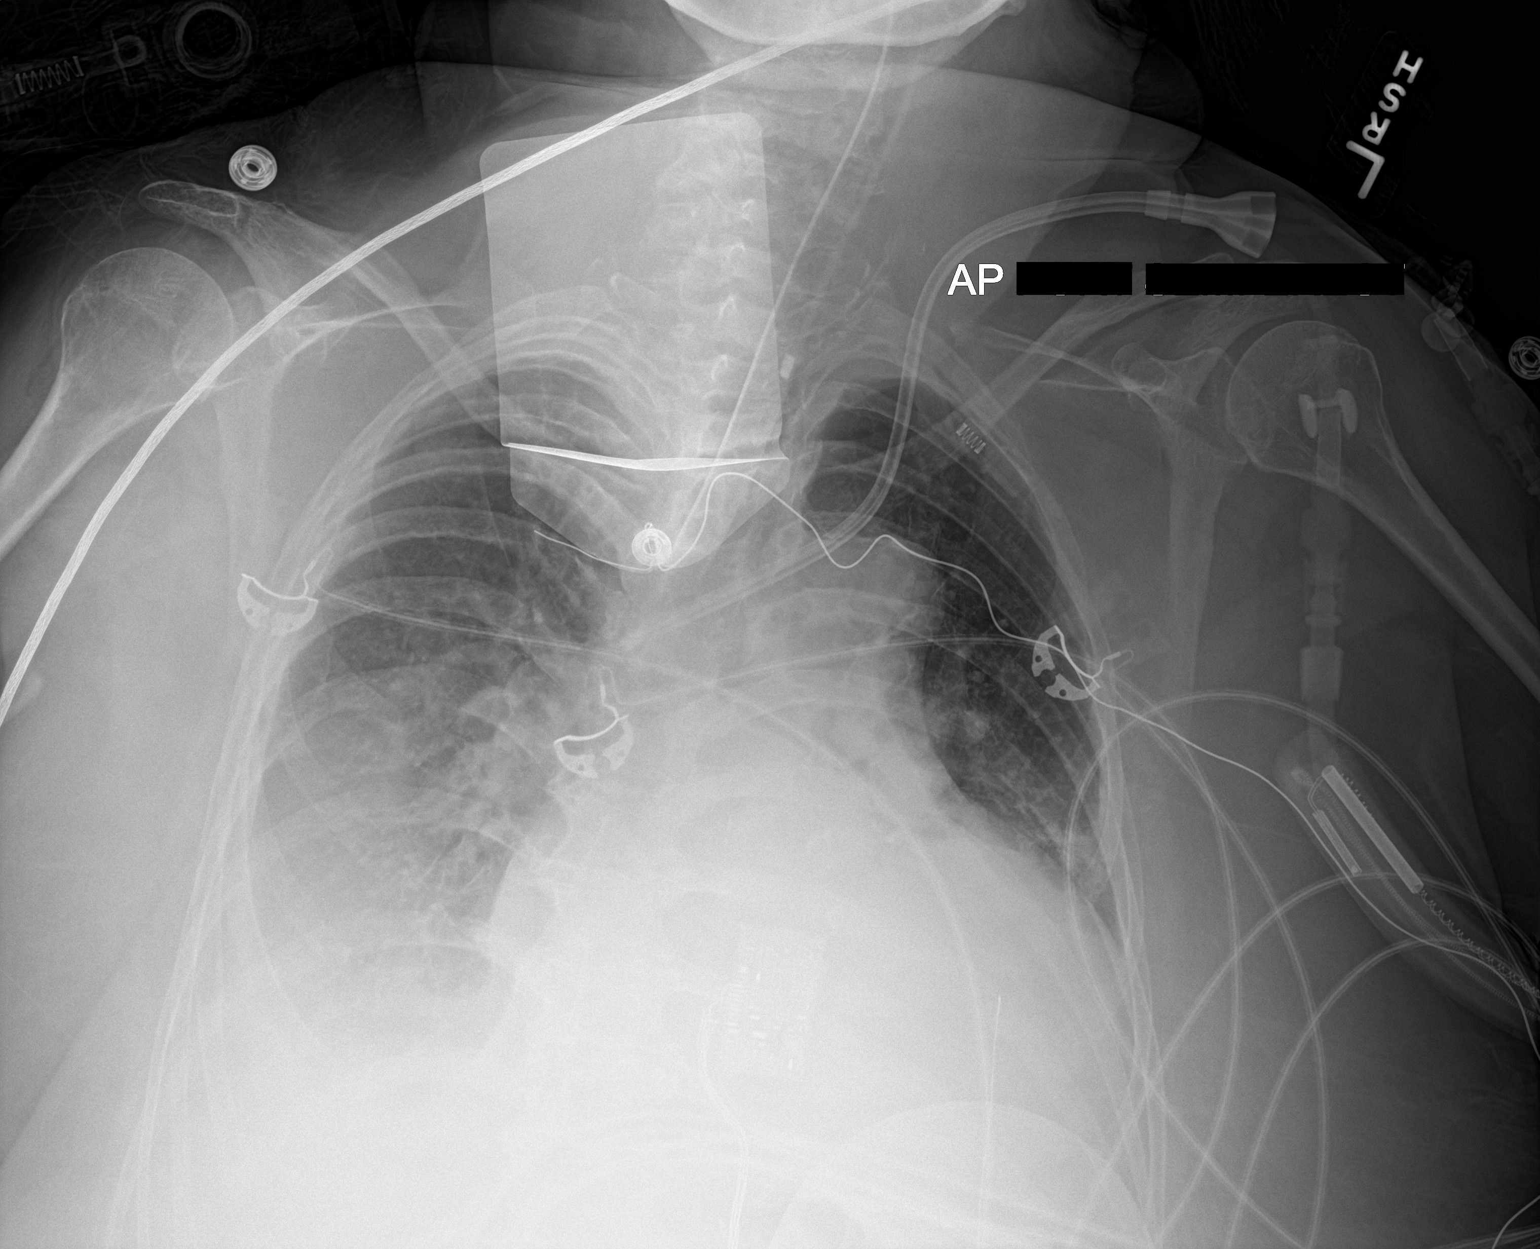

[1 of 1 positions shown; findings below may reference images not displayed]

FINDINGS: Endotracheal tube and left IJ line in stable position. Cardiomegaly
with diffuse bilateral pulmonary infiltrates and right pleural
effusion. Findings consistent with CHF. Findings have worsened from
prior exam. Bilateral pneumonia cannot be excluded. Low lung
volumes. No pneumothorax.
IMPRESSION: 1.  Lines and tubes stable position.

2. Cardiomegaly with diffuse bilateral pulmonary infiltrates and
prominent right pleural effusion. Findings consistent with CHF.
Findings have worsened from prior exam. Bilateral pneumonia cannot
be excluded. Low lung volumes.

## 2020-07-24 IMAGING — US US RENAL
1 series · 14 of 21 positions shown · non-contrast
Comparison: None.

CLINICAL DATA: Acute renal failure

EXAM:
RENAL / URINARY TRACT ULTRASOUND COMPLETE

[Series 1: us renal · 0.26mm/px · 14 of 21 slices shown]
[im 1/21]
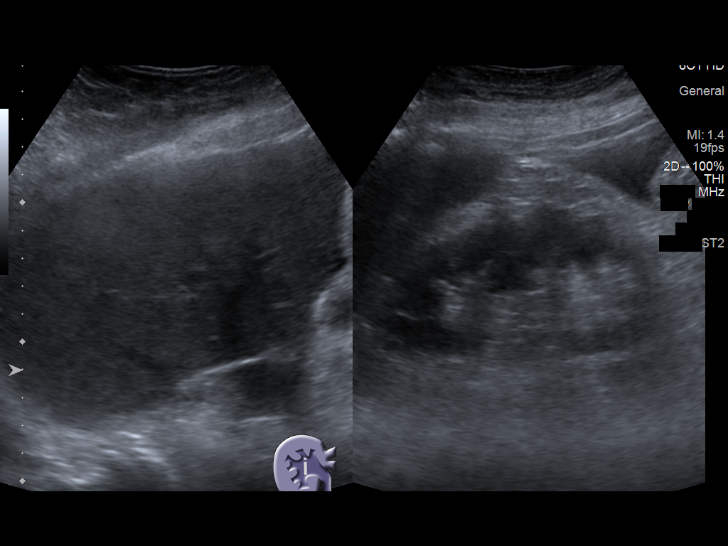
[im 3/21]
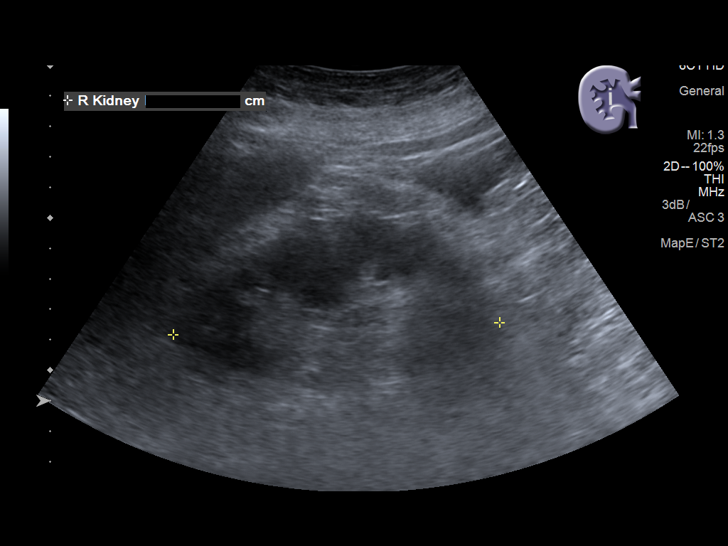
[im 4/21]
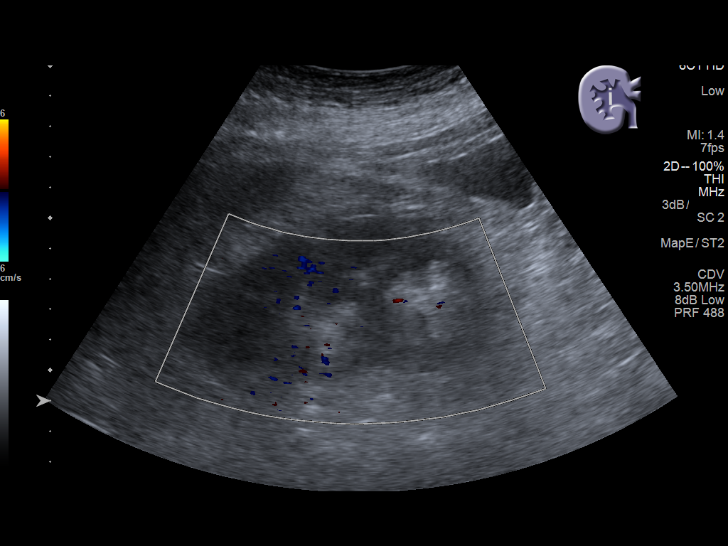
[im 6/21]
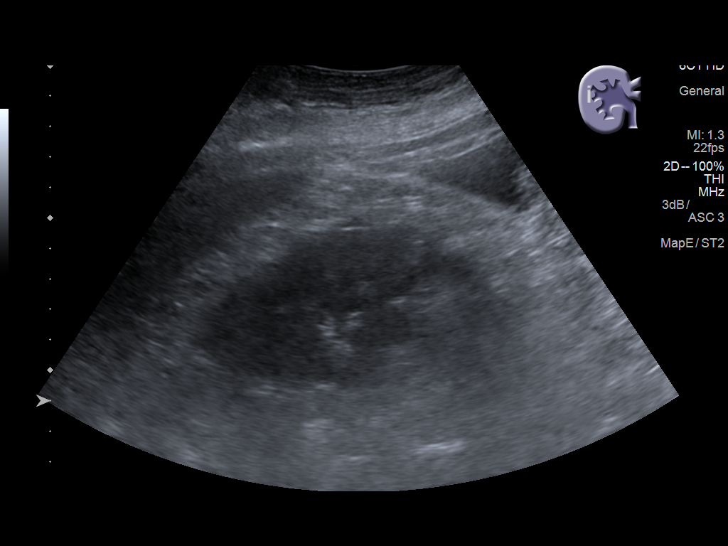
[im 7/21]
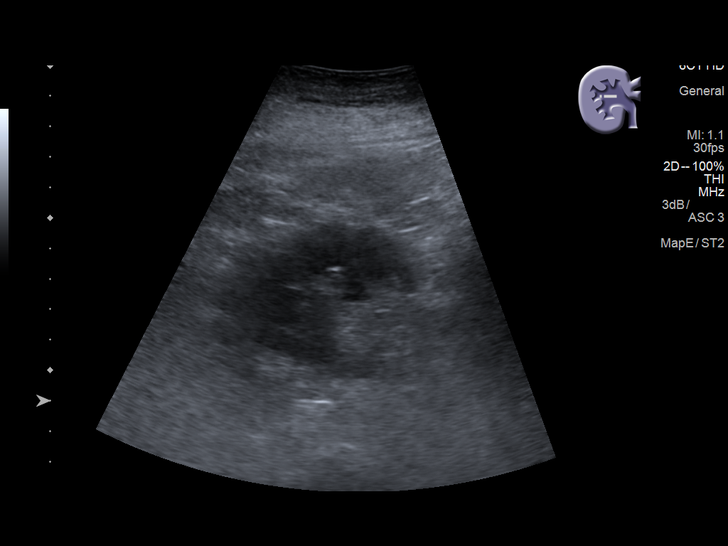
[im 9/21]
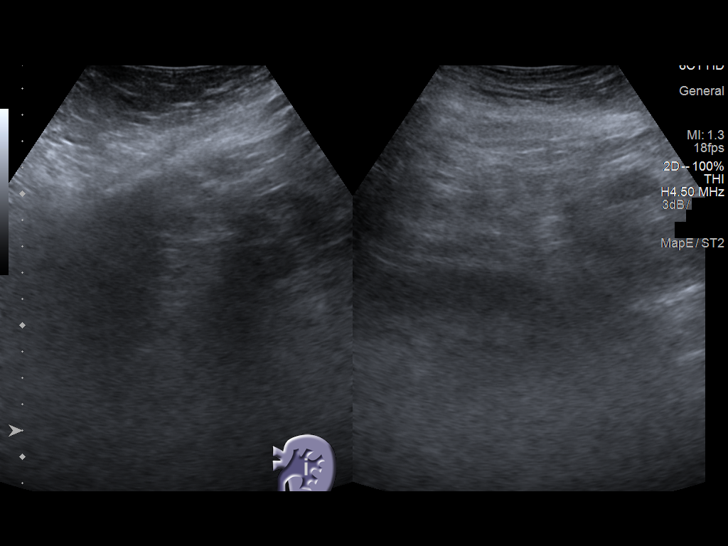
[im 10/21]
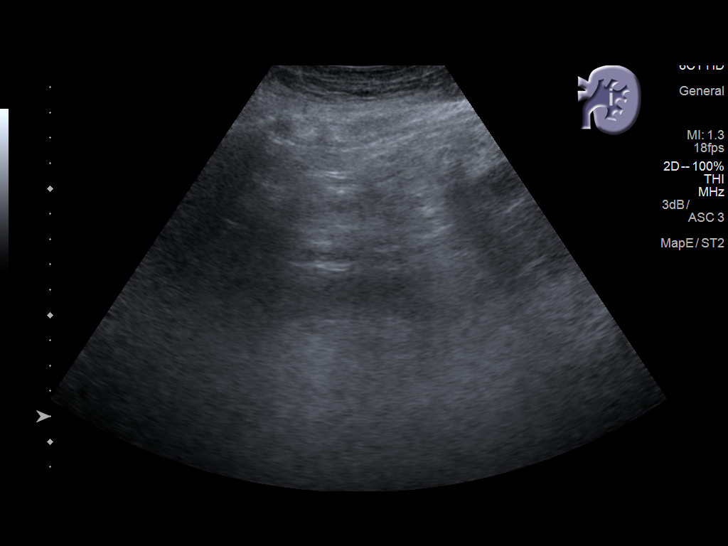
[im 12/21]
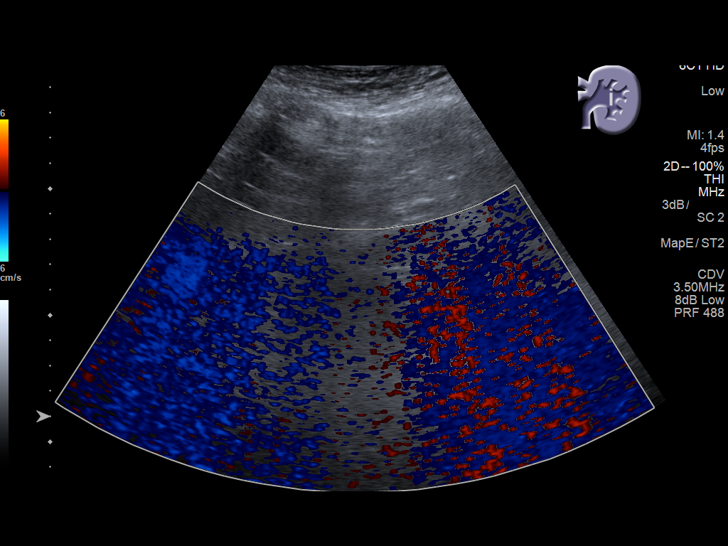
[im 13/21]
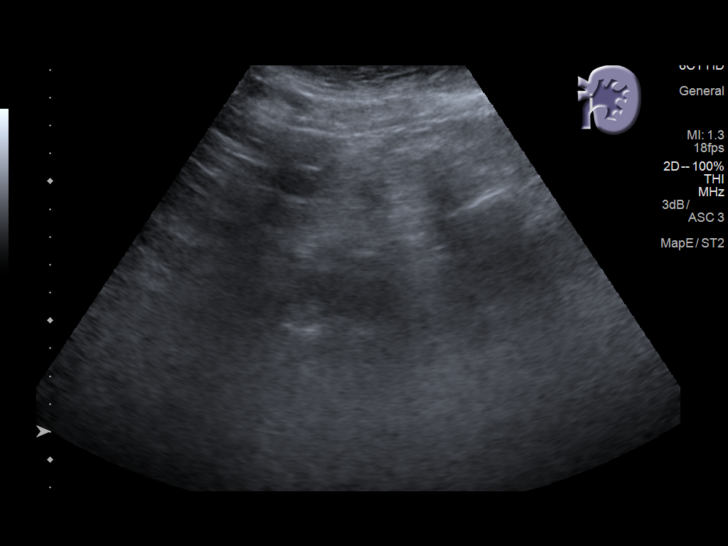
[im 15/21]
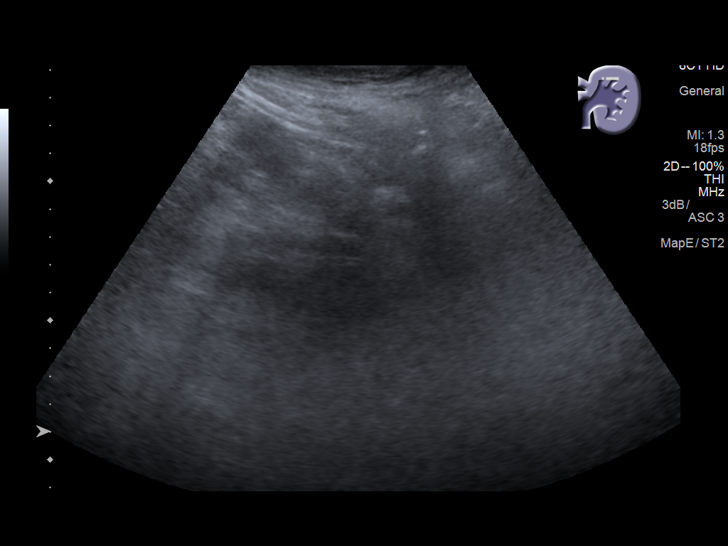
[im 16/21]
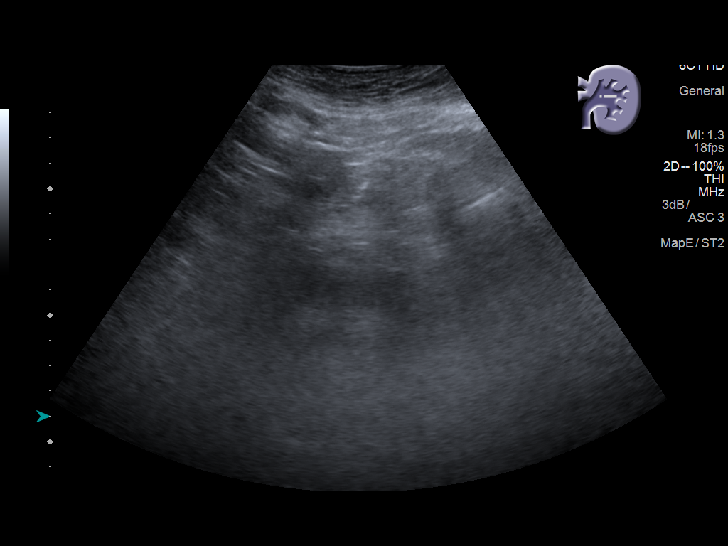
[im 18/21]
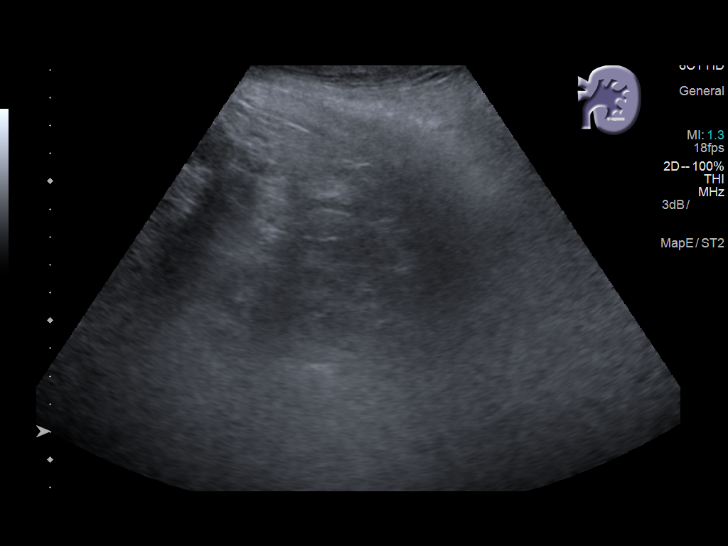
[im 19/21]
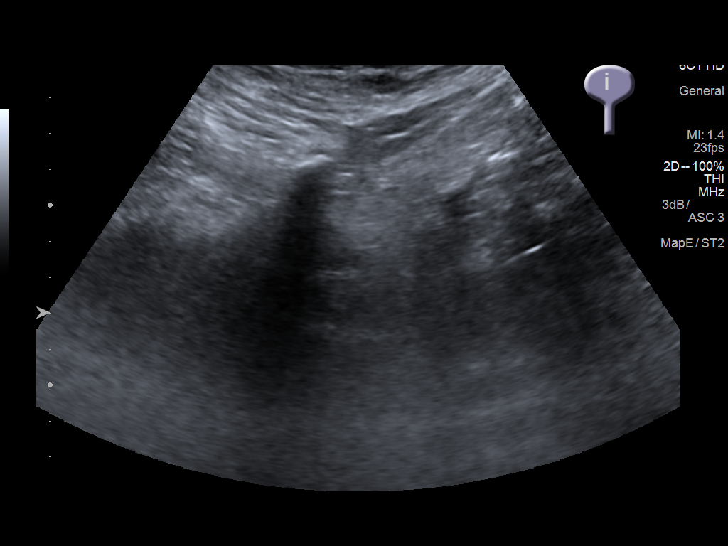
[im 21/21]
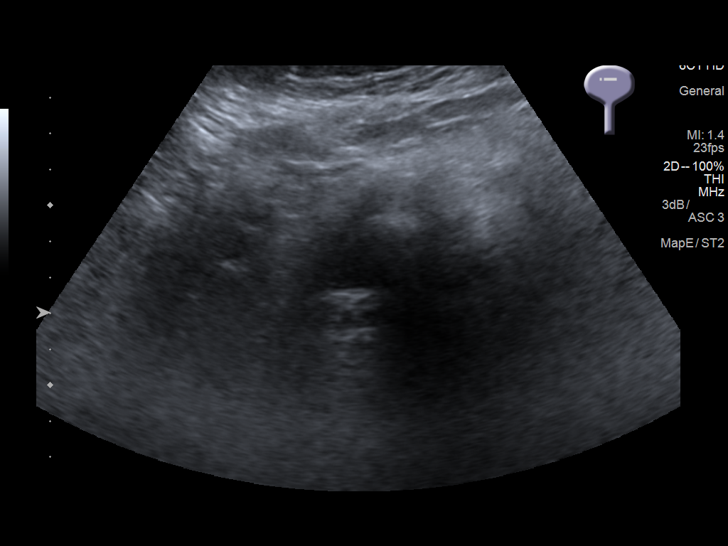

[14 of 21 positions shown; findings below may reference images not displayed]

FINDINGS: Right Kidney:

Length: 10.7 cm. Echogenicity within normal limits. No mass or
hydronephrosis visualized.

Left Kidney:

Length: 9.7 cm. Echogenicity within normal limits. No mass or
hydronephrosis visualized.

Bladder:

Decompressed and poorly visualized.
IMPRESSION: No sonographic findings to explain the patient's acute renal
failure. Cortical-medullary distinction is maintained without
evidence of medical renal disease. No obstructive uropathy is noted.
